# Patient Record
Sex: Female | Born: 1960
Health system: Southern US, Community
[De-identification: ages and names within clinical notes are randomized; demographics above are authoritative.]

## PROBLEM LIST (undated history)

## (undated) DIAGNOSIS — R160 Hepatomegaly, not elsewhere classified: Secondary | ICD-10-CM

## (undated) DIAGNOSIS — T8859XA Other complications of anesthesia, initial encounter: Secondary | ICD-10-CM

## (undated) DIAGNOSIS — I10 Essential (primary) hypertension: Secondary | ICD-10-CM

## (undated) DIAGNOSIS — Z9289 Personal history of other medical treatment: Secondary | ICD-10-CM

## (undated) DIAGNOSIS — M419 Scoliosis, unspecified: Secondary | ICD-10-CM

## (undated) DIAGNOSIS — H509 Unspecified strabismus: Secondary | ICD-10-CM

## (undated) DIAGNOSIS — T4145XA Adverse effect of unspecified anesthetic, initial encounter: Secondary | ICD-10-CM

## (undated) DIAGNOSIS — Z8709 Personal history of other diseases of the respiratory system: Secondary | ICD-10-CM

## (undated) DIAGNOSIS — N186 End stage renal disease: Secondary | ICD-10-CM

## (undated) DIAGNOSIS — Z9889 Other specified postprocedural states: Secondary | ICD-10-CM

## (undated) DIAGNOSIS — R112 Nausea with vomiting, unspecified: Secondary | ICD-10-CM

## (undated) DIAGNOSIS — R011 Cardiac murmur, unspecified: Secondary | ICD-10-CM

## (undated) DIAGNOSIS — Q613 Polycystic kidney, unspecified: Secondary | ICD-10-CM

## (undated) DIAGNOSIS — M62838 Other muscle spasm: Secondary | ICD-10-CM

## (undated) DIAGNOSIS — Z992 Dependence on renal dialysis: Secondary | ICD-10-CM

## (undated) HISTORY — DX: Polycystic kidney, unspecified: Q61.3

## (undated) HISTORY — PX: ABDOMINAL HYSTERECTOMY: SHX81

## (undated) HISTORY — DX: Unspecified strabismus: H50.9

## (undated) HISTORY — PX: EYE SURGERY: SHX253

## (undated) HISTORY — DX: Scoliosis, unspecified: M41.9

## (undated) HISTORY — DX: Personal history of other diseases of the respiratory system: Z87.09

## (undated) HISTORY — DX: Hepatomegaly, not elsewhere classified: R16.0

---

## 1998-05-30 ENCOUNTER — Other Ambulatory Visit: Admission: RE | Admit: 1998-05-30 | Discharge: 1998-05-30 | Payer: Self-pay | Admitting: Obstetrics and Gynecology

## 1999-07-30 ENCOUNTER — Other Ambulatory Visit: Admission: RE | Admit: 1999-07-30 | Discharge: 1999-07-30 | Payer: Self-pay | Admitting: Obstetrics and Gynecology

## 1999-09-19 ENCOUNTER — Ambulatory Visit (HOSPITAL_COMMUNITY): Admission: RE | Admit: 1999-09-19 | Discharge: 1999-09-19 | Payer: Self-pay | Admitting: Obstetrics and Gynecology

## 2000-03-04 ENCOUNTER — Encounter: Payer: Self-pay | Admitting: Obstetrics and Gynecology

## 2000-03-10 ENCOUNTER — Observation Stay (HOSPITAL_COMMUNITY): Admission: RE | Admit: 2000-03-10 | Discharge: 2000-03-11 | Payer: Self-pay | Admitting: Obstetrics and Gynecology

## 2002-10-04 ENCOUNTER — Encounter: Admission: RE | Admit: 2002-10-04 | Discharge: 2002-10-04 | Payer: Self-pay | Admitting: Gastroenterology

## 2002-10-04 ENCOUNTER — Encounter: Payer: Self-pay | Admitting: Gastroenterology

## 2002-10-12 ENCOUNTER — Ambulatory Visit (HOSPITAL_COMMUNITY): Admission: RE | Admit: 2002-10-12 | Discharge: 2002-10-12 | Payer: Self-pay | Admitting: Gastroenterology

## 2003-10-20 ENCOUNTER — Encounter: Admission: RE | Admit: 2003-10-20 | Discharge: 2003-10-20 | Payer: Self-pay | Admitting: Nephrology

## 2004-10-17 ENCOUNTER — Encounter: Admission: RE | Admit: 2004-10-17 | Discharge: 2004-10-17 | Payer: Self-pay | Admitting: Nephrology

## 2004-10-21 ENCOUNTER — Encounter: Admission: RE | Admit: 2004-10-21 | Discharge: 2004-10-21 | Payer: Self-pay | Admitting: Nephrology

## 2004-10-28 ENCOUNTER — Encounter: Admission: RE | Admit: 2004-10-28 | Discharge: 2004-10-28 | Payer: Self-pay | Admitting: Nephrology

## 2011-06-25 ENCOUNTER — Ambulatory Visit (INDEPENDENT_AMBULATORY_CARE_PROVIDER_SITE_OTHER): Payer: BC Managed Care – PPO | Admitting: Family Medicine

## 2011-06-25 VITALS — BP 150/90 | HR 71 | Temp 98.1°F | Resp 18 | Ht 63.5 in | Wt 108.2 lb

## 2011-06-25 DIAGNOSIS — M62838 Other muscle spasm: Secondary | ICD-10-CM

## 2011-06-25 DIAGNOSIS — Q613 Polycystic kidney, unspecified: Secondary | ICD-10-CM

## 2011-06-25 DIAGNOSIS — M549 Dorsalgia, unspecified: Secondary | ICD-10-CM

## 2011-06-25 LAB — COMPREHENSIVE METABOLIC PANEL WITH GFR
ALT: <8 U/L (ref 0–35)
AST: 12 U/L (ref 0–37)
Albumin: 4.5 g/dL (ref 3.5–5.2)
Alkaline Phosphatase: 230 U/L — ABNORMAL HIGH (ref 39–117)
BUN: 41 mg/dL — ABNORMAL HIGH (ref 6–23)
Creat: 3.3 mg/dL — ABNORMAL HIGH (ref 0.50–1.10)
Glucose, Bld: 69 mg/dL — ABNORMAL LOW (ref 70–99)
Sodium: 138 meq/L (ref 135–145)
Total Bilirubin: 0.3 mg/dL (ref 0.3–1.2)
Total Protein: 7.4 g/dL (ref 6.0–8.3)

## 2011-06-25 LAB — POCT UA - MICROSCOPIC ONLY
Bacteria, U Microscopic: NEGATIVE
Casts, Ur, LPF, POC: NEGATIVE
Crystals, Ur, HPF, POC: NEGATIVE
Mucus, UA: NEGATIVE
RBC, urine, microscopic: NEGATIVE
Yeast, UA: NEGATIVE

## 2011-06-25 LAB — POCT URINALYSIS DIPSTICK
Bilirubin, UA: NEGATIVE
Glucose, UA: NEGATIVE
Ketones, UA: NEGATIVE
Leukocytes, UA: NEGATIVE
Nitrite, UA: NEGATIVE
Spec Grav, UA: <=1.005
Urobilinogen, UA: 0.2
pH, UA: 5.5

## 2011-06-25 LAB — COMPREHENSIVE METABOLIC PANEL
CO2: 20 mEq/L (ref 19–32)
Calcium: 8.3 mg/dL — ABNORMAL LOW (ref 8.4–10.5)
Chloride: 109 mEq/L (ref 96–112)
Potassium: 4.7 mEq/L (ref 3.5–5.3)

## 2011-06-25 MED ORDER — CYCLOBENZAPRINE HCL 5 MG PO TABS
5.0000 mg | ORAL_TABLET | Freq: Three times a day (TID) | ORAL | Status: AC | PRN
Start: 2011-06-25 — End: 2011-07-05

## 2011-06-25 MED ORDER — TRAMADOL HCL 50 MG PO TABS: 50.0000 mg | ORAL_TABLET | Freq: Three times a day (TID) | ORAL | Status: AC | PRN

## 2011-06-25 NOTE — Progress Notes (Signed)
[facility]:  Office Visit  Chief Complaint:  Chief Complaint  Patient presents with  . Back Pain    L side of Lower Back Pain - PKD x 23 yrs ago. The pain started 3 days ago and gets worse    HPI: Kendra Clay is a 51 y.o. female who complains of: 1. 3 days ago started having left back pain, no radiation, no numbness, no weakness, no tingling. No trauma, was walking and leaning back against object when started. Was at work when noticed it but not sure if occurred at home or at work. H/o of scoliosis and h/o msk spasms in upper back 10 years ago. Tried 6 Ibuprofen without relief.  2. Elevated BP. H/o Polycystic Kidney Disease and enlarged liver with cysts. Last seen by nephrology was [facility] Dr. Lorrene Reid 6 years ago.   Past Medical History  Diagnosis Date  . Polycystic kidney disease     Genetic dx 23 years ago  . 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 Strabismus   . Scoliosis    Past Surgical History  Procedure Date  . Abdominal hysterectomy   . Eye surgery    History   Social History  . Marital Status: Married    Spouse Name: N/A    Number of Children: N/A  . Years of Education: N/A   Social History Main Topics  . Smoking status: Current Everyday Smoker -- 1.5 packs/day for 30 years    Types: Cigarettes  . Smokeless tobacco: None  . Alcohol Use: No  . Drug Use: None  . Sexually Active: None   Other Topics Concern  . None   Social History Narrative  . None   Family History  Problem Relation Age of Onset  . Arthritis Mother   . Kidney disease Father   . Polycystic kidney disease Son   . Asthma Son    Allergies  Allergen Reactions  . E-Mycin (Erythromycin) Hives  . Sulfa Antibiotics Rash   Prior to Admission medications   Not on File     ROS: The patient denies fevers, chills, night sweats, unintentional weight loss, chest pain, palpitations, wheezing, dyspnea on exertion, nausea, vomiting, abdominal pain, dysuria,  hematuria, melena, numbness, weakness, or tingling.  + back pain All other systems have been reviewed and were otherwise negative with the exception of those mentioned in the HPI and as above.    PHYSICAL EXAM: Filed Vitals:   06/25/11 1635  BP: 150/90  Pulse: 71  Temp: 98.1 F (36.7 C)  Resp: 18   Filed Vitals:   06/25/11 1635  Height: 5' 3.5" (1.613 m)  Weight: 108 lb 3.2 oz (49.079 kg)   Body mass index is 18.87 kg/(m^2).  General: Alert, no acute distress, thin white female HEENT:  Normocephalic, atraumatic, oropharynx patent.  Cardiovascular:  Regular rate and rhythm, no rubs murmurs or gallops.  No Carotid bruits, radial pulse intact. No pedal edema.  Respiratory: Clear to auscultation bilaterally.  No wheezes, rales, or rhonchi.  No cyanosis, no use of accessory musculature GI: + Enlarged liver, no spenomegaly, abdomen is soft and non-tender, positive bowel sounds.  No masses. Skin: No rashes. Neurologic: Facial musculature symmetric. Psychiatric: Patient is appropriate throughout our interaction. Lymphatic: No cervical lymphadenopathy Musculoskeletal: Gait intact. Back: + scoliosis, + tenderness on left paraspinal msk, full ROM, pain with flexion. 5/5 strength, sensation intact, +2 DTRs   LABS: Results for orders placed in visit on 06/25/11  POCT URINALYSIS DIPSTICK      Component Value Range   Color, UA yellow     Clarity, UA clear     Glucose, UA neg     Bilirubin, UA neg     Ketones, UA neg     Spec Grav, UA <=1.005     Blood, UA trace-lysed     pH, UA 5.5     Protein, UA 30mg      Urobilinogen, UA 0.2     Nitrite, UA neg     Leukocytes, UA Negative    POCT UA - MICROSCOPIC ONLY      Component Value Range   WBC, Ur, HPF, POC 0-1     RBC, urine, microscopic neg     Bacteria, U Microscopic neg     Mucus, UA neg     Epithelial cells, urine per micros 0-1     Crystals, Ur, HPF, POC neg     Casts, Ur, LPF, POC neg     Yeast, UA neg       EKG/XRAY:     Primary read interpreted by Dr. Marin Comment at [facility].   ASSESSMENT/PLAN: Encounter Diagnoses  Name Primary?  . Back pain Yes  . Polycystic kidney disease   . Muscle spasm    Back pain secondary to msk strain/sprain. Non traumatic so will defer Xray for now. Pt has a h/o of enlarged liver and polycystic kidney disease. Will rx short term Tramadol and flexeril. Advise patient to avoid Tylenol and NSAID products. Work note given to be excused from 5/22-24. CMP pending.  HTN most likely secondary to PKD, will defer management to nephrology.  Urgent referral to [place], [place], DO 06/25/2011 5:29 PM

## 2011-06-29 ENCOUNTER — Telehealth: Payer: Self-pay | Admitting: Family Medicine

## 2011-06-29 ENCOUNTER — Encounter: Payer: Self-pay | Admitting: Family Medicine

## 2011-06-29 NOTE — Telephone Encounter (Signed)
Attempted to call pt regarding lab results but phone number is no longer in service. WIll send letter. Tried to reach husband but he has same phone #.

## 2011-08-12 ENCOUNTER — Other Ambulatory Visit: Payer: Self-pay

## 2011-08-12 DIAGNOSIS — N184 Chronic kidney disease, stage 4 (severe): Secondary | ICD-10-CM

## 2011-08-12 DIAGNOSIS — Z01810 Encounter for preprocedural cardiovascular examination: Secondary | ICD-10-CM

## 2011-08-18 ENCOUNTER — Encounter: Payer: Self-pay | Admitting: Vascular Surgery

## 2011-08-25 ENCOUNTER — Encounter: Payer: Self-pay | Admitting: Vascular Surgery

## 2011-08-26 ENCOUNTER — Other Ambulatory Visit: Payer: Self-pay | Admitting: *Deleted

## 2011-08-26 ENCOUNTER — Encounter: Payer: Self-pay | Admitting: *Deleted

## 2011-08-26 ENCOUNTER — Ambulatory Visit (INDEPENDENT_AMBULATORY_CARE_PROVIDER_SITE_OTHER): Payer: BC Managed Care – PPO | Admitting: Vascular Surgery

## 2011-08-26 ENCOUNTER — Encounter (INDEPENDENT_AMBULATORY_CARE_PROVIDER_SITE_OTHER): Payer: BC Managed Care – PPO | Admitting: *Deleted

## 2011-08-26 ENCOUNTER — Encounter: Payer: Self-pay | Admitting: Vascular Surgery

## 2011-08-26 VITALS — BP 119/76 | HR 76 | Resp 18 | Ht 63.0 in | Wt 109.7 lb

## 2011-08-26 DIAGNOSIS — Z01810 Encounter for preprocedural cardiovascular examination: Secondary | ICD-10-CM

## 2011-08-26 DIAGNOSIS — N184 Chronic kidney disease, stage 4 (severe): Secondary | ICD-10-CM

## 2011-08-26 DIAGNOSIS — N186 End stage renal disease: Secondary | ICD-10-CM

## 2011-08-26 NOTE — Progress Notes (Signed)
Presents today for evaluation of AV access for hemodialysis. She has a long history of polycystic kidney disease and has recently had acceleration in her renal dysfunction. Her father also had end-stage renal disease related to polycystic kidney disease she is familiar with access issues. He has never had a hemodialysis and her most recent creatinine is 3.8.  Past Medical History  Diagnosis Date  . Polycystic kidney disease     Genetic dx 23 years ago  . 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 Strabismus   . Scoliosis   . History of bronchitis     History  Substance Use Topics  . Smoking status: Former Smoker -- 30 years    Types: Cigarettes    Quit date: 08/11/2011  . Smokeless tobacco: Not on file  . Alcohol Use: No    Family History  Problem Relation Age of Onset  . Arthritis Mother   . Kidney disease Father   . Polycystic kidney disease Son   . Asthma Son     Allergies  Allergen Reactions  . E-Mycin (Erythromycin) Hives  . Sulfa Antibiotics Rash    Current outpatient prescriptions:AMLODIPINE BESYLATE PO, Take by mouth daily., Disp: , Rfl: ;  cyclobenzaprine (FLEXERIL) 5 MG tablet, Take 5 mg by mouth as needed., Disp: , Rfl: ;  nicotine (NICODERM CQ - DOSED IN MG/24 HOURS) 21 mg/24hr patch, Place 1 patch onto the skin daily., Disp: , Rfl: ;  traMADol (ULTRAM) 50 MG tablet, Take 50 mg by mouth as needed., Disp: , Rfl:  Vitamin D, Ergocalciferol, (DRISDOL) 50000 UNITS CAPS, Take 50,000 Units by mouth 2 (two) times a week., Disp: , Rfl:   BP 119/76  Pulse 76  Resp 18  Ht 5\' 3"  (1.6 m)  Wt 109 lb 11.2 oz (49.76 kg)  BMI 19.43 kg/m2  Body mass index is 19.43 kg/(m^2).       Review of systems negative except for history of present illness.  Visible exam a well-developed thin white female in no acute distress Pulse status reveals 2+ radial pulses bilaterally. Respirations are nonlabored and equal bilaterally Neurologically she is grossly intact Abdomen is  markedly distended secondary to polycystic kidney disease, nontender Skin without ulcers or rashes. She does have very superficial skin peripheral veins bilaterally there does appear to be some thickening versus thrombus in her left antecubital vein from the old IV.  Vein map of her left arm reveals a 3 mm cephalic vein at the antecubital space and 2-1/2 mm above and below. Basilic vein ranges from 3.5-4 mm on the left.  I did reimage her right arm veins with SonoSite these do appear to be slightly larger than her left arm veins.  Impression and plan: Progressive renal insufficiency secondary to polycystic kidney disease. She has small surface veins bilaterally. She is quite thin veins are very superficial. I discussed options but have recommended exploration of her right cephalic vein at the wrist and place a wrist fistula if this is acceptable size. If not we would recommend placement of an upper arm fistula on the right we have scheduled this at her convenience on 09/01/2011

## 2011-08-29 ENCOUNTER — Encounter (HOSPITAL_COMMUNITY): Payer: Self-pay | Admitting: *Deleted

## 2011-08-29 NOTE — Progress Notes (Signed)
[id number]  Friday.... -Spoke with Barron Schmid, RN from [facility], i attempted to call both numbers...[id number] (disconnected) & [phone]--voice mail is not set up and made her aware that we cannot contact this patient at this time........DA

## 2011-08-31 MED ORDER — VANCOMYCIN HCL 1000 MG IV SOLR
1500.0000 mg | INTRAVENOUS | Status: DC
  Filled 2011-08-31: qty 1500

## 2011-09-01 ENCOUNTER — Ambulatory Visit (HOSPITAL_COMMUNITY): Payer: BC Managed Care – PPO

## 2011-09-01 ENCOUNTER — Encounter (HOSPITAL_COMMUNITY)
Admission: RE | Disposition: A | Discharge status: Home / Self Care | Payer: Self-pay | Source: Ambulatory Visit | Attending: Vascular Surgery

## 2011-09-01 ENCOUNTER — Encounter (HOSPITAL_COMMUNITY): Payer: Self-pay | Admitting: Anesthesiology

## 2011-09-01 ENCOUNTER — Telehealth: Payer: Self-pay | Admitting: Vascular Surgery

## 2011-09-01 ENCOUNTER — Ambulatory Visit (HOSPITAL_COMMUNITY): Payer: BC Managed Care – PPO | Admitting: Anesthesiology

## 2011-09-01 ENCOUNTER — Ambulatory Visit (HOSPITAL_COMMUNITY)
Admission: RE | Admit: 2011-09-01 | Discharge: 2011-09-01 | Disposition: A | Discharge status: Home / Self Care | Payer: BC Managed Care – PPO | Source: Ambulatory Visit | Attending: Vascular Surgery | Admitting: Vascular Surgery

## 2011-09-01 ENCOUNTER — Encounter (HOSPITAL_COMMUNITY): Payer: Self-pay | Admitting: *Deleted

## 2011-09-01 DIAGNOSIS — N186 End stage renal disease: Secondary | ICD-10-CM

## 2011-09-01 DIAGNOSIS — I129 Hypertensive chronic kidney disease with stage 1 through stage 4 chronic kidney disease, or unspecified chronic kidney disease: Secondary | ICD-10-CM | POA: Insufficient documentation

## 2011-09-01 DIAGNOSIS — Q613 Polycystic kidney, unspecified: Secondary | ICD-10-CM | POA: Insufficient documentation

## 2011-09-01 DIAGNOSIS — Z87891 Personal history of nicotine dependence: Secondary | ICD-10-CM | POA: Insufficient documentation

## 2011-09-01 DIAGNOSIS — N189 Chronic kidney disease, unspecified: Secondary | ICD-10-CM | POA: Insufficient documentation

## 2011-09-01 HISTORY — DX: Adverse effect of unspecified anesthetic, initial encounter: T41.45XA

## 2011-09-01 HISTORY — PX: AV FISTULA PLACEMENT: SHX1204

## 2011-09-01 HISTORY — DX: Personal history of other medical treatment: Z92.89

## 2011-09-01 HISTORY — DX: Other muscle spasm: M62.838

## 2011-09-01 HISTORY — DX: Other specified postprocedural states: Z98.890

## 2011-09-01 HISTORY — DX: Cardiac murmur, unspecified: R01.1

## 2011-09-01 HISTORY — DX: Nausea with vomiting, unspecified: R11.2

## 2011-09-01 HISTORY — DX: Other complications of anesthesia, initial encounter: T88.59XA

## 2011-09-01 HISTORY — DX: Essential (primary) hypertension: I10

## 2011-09-01 SURGERY — ARTERIOVENOUS (AV) FISTULA CREATION
Anesthesia: Monitor Anesthesia Care | Site: Arm Lower | Laterality: Right | Wound class: Clean

## 2011-09-01 MED ORDER — PROPOFOL 10 MG/ML IV EMUL
INTRAVENOUS | Status: DC | PRN
  Administered 2011-09-01: 50 ug/kg/min via INTRAVENOUS

## 2011-09-01 MED ORDER — PROMETHAZINE HCL 25 MG/ML IJ SOLN: 6.2500 mg | INTRAMUSCULAR | Status: DC | PRN

## 2011-09-01 MED ORDER — MUPIROCIN 2 % EX OINT
TOPICAL_OINTMENT | CUTANEOUS | Status: AC
  Filled 2011-09-01: qty 22

## 2011-09-01 MED ORDER — SODIUM CHLORIDE 0.9 % IR SOLN
Status: DC | PRN
  Administered 2011-09-01: 10:00:00

## 2011-09-01 MED ORDER — SODIUM CHLORIDE 0.9 % IV SOLN: INTRAVENOUS | Status: DC

## 2011-09-01 MED ORDER — MIDAZOLAM HCL 5 MG/5ML IJ SOLN
INTRAMUSCULAR | Status: DC | PRN
  Administered 2011-09-01: 2 mg via INTRAVENOUS

## 2011-09-01 MED ORDER — SODIUM CHLORIDE 0.9 % IV SOLN
INTRAVENOUS | Status: DC | PRN
  Administered 2011-09-01: 09:00:00 via INTRAVENOUS

## 2011-09-01 MED ORDER — VANCOMYCIN HCL IN DEXTROSE 1-5 GM/200ML-% IV SOLN
1000.0000 mg | INTRAVENOUS | Status: AC
  Administered 2011-09-01: 1000 mg via INTRAVENOUS

## 2011-09-01 MED ORDER — VANCOMYCIN HCL IN DEXTROSE 1-5 GM/200ML-% IV SOLN
INTRAVENOUS | Status: AC
  Filled 2011-09-01: qty 200

## 2011-09-01 MED ORDER — TRAMADOL HCL 50 MG PO TABS: 50.0000 mg | ORAL_TABLET | Freq: Three times a day (TID) | ORAL | Status: DC | PRN

## 2011-09-01 MED ORDER — FENTANYL CITRATE 0.05 MG/ML IJ SOLN: 25.0000 ug | INTRAMUSCULAR | Status: DC | PRN

## 2011-09-01 MED ORDER — LIDOCAINE-EPINEPHRINE 0.5 %-1:200000 IJ SOLN
INTRAMUSCULAR | Status: DC | PRN
  Administered 2011-09-01: 5 mL

## 2011-09-01 MED ORDER — MUPIROCIN 2 % EX OINT
TOPICAL_OINTMENT | Freq: Two times a day (BID) | CUTANEOUS | Status: DC
  Administered 2011-09-01: 07:00:00 via NASAL

## 2011-09-01 MED ORDER — 0.9 % SODIUM CHLORIDE (POUR BTL) OPTIME
TOPICAL | Status: DC | PRN
  Administered 2011-09-01: 1000 mL

## 2011-09-01 SURGICAL SUPPLY — 38 items
BENZOIN TINCTURE PRP APPL 2/3 (GAUZE/BANDAGES/DRESSINGS) ×2 IMPLANT
CANISTER SUCTION 2500CC (MISCELLANEOUS) ×2 IMPLANT
CLIP LIGATING EXTRA MED SLVR (CLIP) ×2 IMPLANT
CLIP LIGATING EXTRA SM BLUE (MISCELLANEOUS) ×2 IMPLANT
CLOTH BEACON ORANGE TIMEOUT ST (SAFETY) ×2 IMPLANT
COVER PROBE W GEL 5X96 (DRAPES) IMPLANT
COVER SURGICAL LIGHT HANDLE (MISCELLANEOUS) ×2 IMPLANT
DECANTER SPIKE VIAL GLASS SM (MISCELLANEOUS) ×2 IMPLANT
ELECT REM PT RETURN 9FT ADLT (ELECTROSURGICAL) ×2
ELECTRODE REM PT RTRN 9FT ADLT (ELECTROSURGICAL) ×1 IMPLANT
GEL ULTRASOUND 20GR AQUASONIC (MISCELLANEOUS) IMPLANT
GLOVE BIO SURGEON STRL SZ 6.5 (GLOVE) ×2 IMPLANT
GLOVE BIOGEL PI IND STRL 6.5 (GLOVE) ×1 IMPLANT
GLOVE BIOGEL PI IND STRL 7.0 (GLOVE) ×1 IMPLANT
GLOVE BIOGEL PI INDICATOR 6.5 (GLOVE) ×1
GLOVE BIOGEL PI INDICATOR 7.0 (GLOVE) ×1
GLOVE SS BIOGEL STRL SZ 7.5 (GLOVE) ×1 IMPLANT
GLOVE SUPERSENSE BIOGEL SZ 7.5 (GLOVE) ×1
GLOVE SURG SS PI 6.0 STRL IVOR (GLOVE) ×2 IMPLANT
GLOVE SURG SS PI 7.5 STRL IVOR (GLOVE) ×2 IMPLANT
GOWN PREVENTION PLUS XLARGE (GOWN DISPOSABLE) ×2 IMPLANT
GOWN STRL NON-REIN LRG LVL3 (GOWN DISPOSABLE) ×4 IMPLANT
KIT BASIN OR (CUSTOM PROCEDURE TRAY) ×2 IMPLANT
KIT ROOM TURNOVER OR (KITS) ×2 IMPLANT
NS IRRIG 1000ML POUR BTL (IV SOLUTION) ×2 IMPLANT
PACK CV ACCESS (CUSTOM PROCEDURE TRAY) ×2 IMPLANT
PAD ARMBOARD 7.5X6 YLW CONV (MISCELLANEOUS) ×4 IMPLANT
SPONGE GAUZE 4X4 12PLY (GAUZE/BANDAGES/DRESSINGS) ×2 IMPLANT
STRIP CLOSURE SKIN 1/2X4 (GAUZE/BANDAGES/DRESSINGS) ×2 IMPLANT
SUT PROLENE 6 0 CC (SUTURE) ×2 IMPLANT
SUT VIC AB 3-0 SH 27 (SUTURE) ×1
SUT VIC AB 3-0 SH 27X BRD (SUTURE) ×1 IMPLANT
SUT VICRYL 4-0 PS2 18IN ABS (SUTURE) ×2 IMPLANT
TAPE CLOTH SURG 4X10 WHT LF (GAUZE/BANDAGES/DRESSINGS) ×2 IMPLANT
TOWEL OR 17X24 6PK STRL BLUE (TOWEL DISPOSABLE) ×2 IMPLANT
TOWEL OR 17X26 10 PK STRL BLUE (TOWEL DISPOSABLE) ×2 IMPLANT
UNDERPAD 30X30 INCONTINENT (UNDERPADS AND DIAPERS) ×2 IMPLANT
WATER STERILE IRR 1000ML POUR (IV SOLUTION) ×2 IMPLANT

## 2011-09-01 NOTE — Interval H&P Note (Signed)
History and Physical Interval Note:  09/01/2011 7:15 AM  Kendra Clay  has presented today for surgery, with the diagnosis of ESRD  The various methods of treatment have been discussed with the patient and family. After consideration of risks, benefits and other options for treatment, the patient has consented to  Procedure(s) (LRB): ARTERIOVENOUS (AV) FISTULA CREATION (Right) as a surgical intervention .  The patient's history has been reviewed, patient examined, no change in status, stable for surgery.  I have reviewed the patient's chart and labs.  Questions were answered to the patient's satisfaction.     EARLY, TODD

## 2011-09-01 NOTE — Anesthesia Postprocedure Evaluation (Signed)
  Anesthesia Post-op Note  Patient: Kendra Clay  Procedure(s) Performed: Procedure(s) (LRB): ARTERIOVENOUS (AV) FISTULA CREATION (Right)  Patient Location: PACU  Anesthesia Type: MAC  Level of Consciousness: awake  Airway and Oxygen Therapy: Patient Spontanous Breathing  Post-op Pain: none  Post-op Assessment: Post-op Vital signs reviewed  Post-op Vital Signs: stable  Complications: No apparent anesthesia complications

## 2011-09-01 NOTE — Op Note (Signed)
OPERATIVE REPORT  DATE OF SURGERY: 09/01/2011  PATIENT: Kendra Clay, 51 y.o. female MRN: [id number]  DOB: [birth year]  PRE-OPERATIVE DIAGNOSIS: Chronic renal insufficiency  POST-OPERATIVE DIAGNOSIS:  Same  PROCEDURE: Right Cimino AV fistula  SURGEON:  Curt Jews, M.D.  PHYSICIAN ASSISTANT: Roczniak  ANESTHESIA:  Local with sedation  EBL: Minimal ml  Total I/O In: 300 [I.V.:300] Out: 31 [Urine:1; Blood:30]  BLOOD ADMINISTERED: None  DRAINS: None  SPECIMEN: None  COUNTS CORRECT:  YES  PLAN OF CARE: PACU stable   PATIENT DISPOSITION:  PACU - hemodynamically stable  PROCEDURE DETAILS: Patient was taken up replacing that is where the area of the right arm and right wrist were prepped in sterile fashion. An incision was made using local anesthesia between the level of the cephalic vein and the radial artery. The cephalic vein was ligated distally and divided and was gently dilated. The vein was of good caliber. The artery was exposed through the same incision. There was some spasm in the radial artery. The artery was occluded proximally and distally with Serafin clamps and was opened with an 11 blade and sent longitudinally with Potts scissors. A 1-1/2 and 2 mm dilator were passed through the artery. The vein was cut to appropriate length and was spatulated and sewn end-to-side to the artery with a running 60. Clamps removed and good flow was noted through the fistula. There were 2 obvious side branches off the cephalic vein. These were controlled by making a small incision over them with local anesthesia and they were occluded with a Hemoclip. The wounds were closed with 30 and 4-0 Vicryl sutures in the subcutaneous tissue a sterile dressing was applied   Curt Jews, M.D. 09/01/2011 11:44 AM

## 2011-09-01 NOTE — Anesthesia Preprocedure Evaluation (Addendum)
Anesthesia Evaluation  Patient identified by MRN, date of birth, ID band Patient awake    Reviewed: Allergy & Precautions, H&P , NPO status , Patient's Chart, lab work & pertinent test results, reviewed documented beta blocker date and time   History of Anesthesia Complications (+) PONV  Airway Mallampati: II TM Distance: >3 FB Neck ROM: Full    Dental  (+) Poor Dentition and Dental Advisory Given   Pulmonary former smoker,  breath sounds clear to auscultation        Cardiovascular hypertension, Pt. on medications + Valvular Problems/Murmurs Rhythm:Regular Rate:Normal     Neuro/Psych negative neurological ROS  negative psych ROS   GI/Hepatic negative GI ROS, Neg liver ROS,   Endo/Other  negative endocrine ROS  Renal/GU Renal InsufficiencyRenal disease  negative genitourinary   Musculoskeletal negative musculoskeletal ROS (+)   Abdominal   Peds negative pediatric ROS (+)  Hematology negative hematology ROS (+)   Anesthesia Other Findings   Reproductive/Obstetrics negative OB ROS                         Anesthesia Physical Anesthesia Plan  ASA: III  Anesthesia Plan: MAC   Post-op Pain Management:    Induction: Intravenous  Airway Management Planned: Simple Face Mask  Additional Equipment:   Intra-op Plan:   Post-operative Plan:   Informed Consent: I have reviewed the patients History and Physical, chart, labs and discussed the procedure including the risks, benefits and alternatives for the proposed anesthesia with the patient or authorized representative who has indicated his/her understanding and acceptance.   Dental advisory given  Plan Discussed with: CRNA and Surgeon  Anesthesia Plan Comments:         Anesthesia Quick Evaluation

## 2011-09-01 NOTE — Telephone Encounter (Signed)
lvm for pt regarding appt 09/30/11 @ 10:15 with [facility], sent letter also, dpm

## 2011-09-01 NOTE — Preoperative (Signed)
Beta Blockers   Reason not to administer Beta Blockers:Not Applicable 

## 2011-09-01 NOTE — Transfer of Care (Signed)
Immediate Anesthesia Transfer of Care Note  Patient: Kendra Clay  Procedure(s) Performed: Procedure(s) (LRB): ARTERIOVENOUS (AV) FISTULA CREATION (Right)  Patient Location: PACU  Anesthesia Type: MAC  Level of Consciousness: awake, alert  and oriented  Airway & Oxygen Therapy: Patient Spontanous Breathing  Post-op Assessment: Report given to PACU RN and Post -op Vital signs reviewed and stable  Post vital signs: Reviewed and stable  Complications: No apparent anesthesia complications

## 2011-09-01 NOTE — Telephone Encounter (Signed)
Message copied by Gena Fray on Mon Sep 01, 2011 12:22 PM ------      Message from: Alfonso Patten      Created: Mon Sep 01, 2011 12:00 PM                   ----- Message -----         From: Richrd Prime, [place]         Sent: 09/01/2011  11:09 AM           To: Alfonso Patten, RN            4 week F/U AVF - Early

## 2011-09-01 NOTE — H&P (View-Only) (Signed)
Presents today for evaluation of AV access for hemodialysis. She has a long history of polycystic kidney disease and has recently had acceleration in her renal dysfunction. Her father also had end-stage renal disease related to polycystic kidney disease she is familiar with access issues. He has never had a hemodialysis and her most recent creatinine is 3.8.  Past Medical History  Diagnosis Date  . Polycystic kidney disease     Genetic dx 23 years ago  . 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 Strabismus   . Scoliosis   . History of bronchitis     History  Substance Use Topics  . Smoking status: Former Smoker -- 30 years    Types: Cigarettes    Quit date: 08/11/2011  . Smokeless tobacco: Not on file  . Alcohol Use: No    Family History  Problem Relation Age of Onset  . Arthritis Mother   . Kidney disease Father   . Polycystic kidney disease Son   . Asthma Son     Allergies  Allergen Reactions  . E-Mycin (Erythromycin) Hives  . Sulfa Antibiotics Rash    Current outpatient prescriptions:AMLODIPINE BESYLATE PO, Take by mouth daily., Disp: , Rfl: ;  cyclobenzaprine (FLEXERIL) 5 MG tablet, Take 5 mg by mouth as needed., Disp: , Rfl: ;  nicotine (NICODERM CQ - DOSED IN MG/24 HOURS) 21 mg/24hr patch, Place 1 patch onto the skin daily., Disp: , Rfl: ;  traMADol (ULTRAM) 50 MG tablet, Take 50 mg by mouth as needed., Disp: , Rfl:  Vitamin D, Ergocalciferol, (DRISDOL) 50000 UNITS CAPS, Take 50,000 Units by mouth 2 (two) times a week., Disp: , Rfl:   BP 119/76  Pulse 76  Resp 18  Ht 5\' 3"  (1.6 m)  Wt 109 lb 11.2 oz (49.76 kg)  BMI 19.43 kg/m2  Body mass index is 19.43 kg/(m^2).       Review of systems negative except for history of present illness.  Visible exam a well-developed thin white female in no acute distress Pulse status reveals 2+ radial pulses bilaterally. Respirations are nonlabored and equal bilaterally Neurologically she is grossly intact Abdomen is  markedly distended secondary to polycystic kidney disease, nontender Skin without ulcers or rashes. She does have very superficial skin peripheral veins bilaterally there does appear to be some thickening versus thrombus in her left antecubital vein from the old IV.  Vein map of her left arm reveals a 3 mm cephalic vein at the antecubital space and 2-1/2 mm above and below. Basilic vein ranges from 3.5-4 mm on the left.  I did reimage her right arm veins with SonoSite these do appear to be slightly larger than her left arm veins.  Impression and plan: Progressive renal insufficiency secondary to polycystic kidney disease. She has small surface veins bilaterally. She is quite thin veins are very superficial. I discussed options but have recommended exploration of her right cephalic vein at the wrist and place a wrist fistula if this is acceptable size. If not we would recommend placement of an upper arm fistula on the right we have scheduled this at her convenience on 09/01/2011

## 2011-09-02 ENCOUNTER — Encounter (HOSPITAL_COMMUNITY): Payer: Self-pay | Admitting: Vascular Surgery

## 2011-09-02 LAB — POCT I-STAT 4, (NA,K, GLUC, HGB,HCT): HCT: 40 % (ref 36.0–46.0)

## 2011-09-02 NOTE — Procedures (Unsigned)
CEPHALIC VEIN MAPPING  INDICATION:  Preoperative vein mapping for planned dialysis access.  HISTORY: Chronic kidney disease.  EXAM: The right cephalic and basilic veins were not evaluated.  The left cephalic vein is compressible.  Diameter measurements range from 0.28 to 0.25 cm.  The left basilic vein is compressible.  Diameter measurements range from 0.41 to 0.36 cm.  See attached worksheet for all measurements.  IMPRESSION:  Patent left cephalic and basilic veins with diameter measurements as described above.  ___________________________________________ Rosetta Posner, M.D.  LT/MEDQ  D:  08/27/2011  T:  08/27/2011  Job:  [id number]

## 2011-09-29 ENCOUNTER — Encounter: Payer: Self-pay | Admitting: Vascular Surgery

## 2011-09-30 ENCOUNTER — Ambulatory Visit (INDEPENDENT_AMBULATORY_CARE_PROVIDER_SITE_OTHER): Payer: BC Managed Care – PPO | Admitting: Vascular Surgery

## 2011-09-30 ENCOUNTER — Encounter: Payer: Self-pay | Admitting: Vascular Surgery

## 2011-09-30 VITALS — BP 126/74 | HR 68 | Temp 98.3°F | Ht 63.0 in | Wt 107.0 lb

## 2011-09-30 DIAGNOSIS — N186 End stage renal disease: Secondary | ICD-10-CM

## 2011-09-30 NOTE — Progress Notes (Signed)
The patient is here today for followup of her AV fistula creation by myself on 09/01/2011. Her incisions are all healing quite nicely and she has excellent Julieanna Geraci maturation of her right Cimino AV fistula. She has excellent thrill and very good size maturation. She does describe some coolness in her hand especially when she is cold. I do feel that she is having some mild steal symptoms and discussed this with her. She does have a 2+ ulnar pulse at the wrist. Her surgical incisions well healed.  Impression and plan: Excellent [place] maturation of right Cimino AV fistula. The patient will continue exercising her hand and will see [place] on an as-needed basis

## 2012-05-03 ENCOUNTER — Other Ambulatory Visit: Payer: Self-pay | Admitting: Nephrology

## 2012-05-03 DIAGNOSIS — Q446 Cystic disease of liver: Secondary | ICD-10-CM

## 2012-05-06 ENCOUNTER — Ambulatory Visit
Admission: RE | Admit: 2012-05-06 | Discharge: 2012-05-06 | Disposition: A | Discharge status: Home / Self Care | Payer: BC Managed Care – PPO | Source: Ambulatory Visit | Attending: Nephrology | Admitting: Nephrology

## 2012-05-06 DIAGNOSIS — Q446 Cystic disease of liver: Secondary | ICD-10-CM

## 2013-07-17 ENCOUNTER — Ambulatory Visit (INDEPENDENT_AMBULATORY_CARE_PROVIDER_SITE_OTHER): Payer: BC Managed Care – PPO | Admitting: Family Medicine

## 2013-07-17 ENCOUNTER — Ambulatory Visit (INDEPENDENT_AMBULATORY_CARE_PROVIDER_SITE_OTHER): Payer: BC Managed Care – PPO

## 2013-07-17 VITALS — BP 122/70 | HR 74 | Temp 97.7°F | Resp 14 | Ht 63.5 in | Wt 114.8 lb

## 2013-07-17 DIAGNOSIS — Z992 Dependence on renal dialysis: Secondary | ICD-10-CM

## 2013-07-17 DIAGNOSIS — M79671 Pain in right foot: Secondary | ICD-10-CM

## 2013-07-17 DIAGNOSIS — Q613 Polycystic kidney, unspecified: Secondary | ICD-10-CM

## 2013-07-17 DIAGNOSIS — M79609 Pain in unspecified limb: Secondary | ICD-10-CM

## 2013-07-17 DIAGNOSIS — N186 End stage renal disease: Secondary | ICD-10-CM

## 2013-07-17 NOTE — Progress Notes (Signed)
Subjective:    Patient ID: Kendra Clay, female    DOB: 01/22/1961, 53 y.o.   MRN: [id number]  Toe Pain    Chief Complaint  Patient presents with   Toe Pain    patient thinks she broke her toe, right middle toe,    This chart was scribed for Robyn Haber, MD by Thea Alken, ED Scribe. This patient was seen in room 3 and the patient's care was started at 12:12 PM.  HPI Comments: Kendra Clay is a 53 y.o. female who presents to the [facility] complaining of tifht 3rd toe pain 1 day ago. Pt reports she ran into her dog. She believes her toe may be broken and that she has bruising to toe. Pt has trouble with gait. Pt has polycystic kidney disease and on dialysis.   Patient Active Problem List   Diagnosis Date Noted   End stage renal disease 08/26/2011   Past Medical History  Diagnosis Date   Polycystic kidney disease     Genetic dx 23 years ago   Enlarged liver     secondary to PKD   Strabismus    Scoliosis    History of bronchitis     numerous, last time> 1 year   Complication of anesthesia    PONV (postoperative nausea and vomiting)     patch helped   Heart murmur     "slight" per ? Dr Deatra Ina 30 years ago. 2D ECHO  30 yearsa go.   Hypertension    History of blood transfusion     C- Section   Night muscle spasms     legs   Allergies  Allergen Reactions   E-Mycin [Erythromycin] Hives   Sulfa Antibiotics Rash   Prior to Admission medications   Medication Sig Start Date End Date Taking? Authorizing Provider  cyclobenzaprine (FLEXERIL) 5 MG tablet Take 5 mg by mouth 3 (three) times daily as needed.    Yes Historical Provider, MD  loratadine (CLARITIN) 10 MG tablet Take 10 mg by mouth daily as needed for allergies.   Yes Historical Provider, MD  amLODipine (NORVASC) 10 MG tablet Take 10 mg by mouth daily.    Historical Provider, MD  calcitRIOL (ROCALTROL) 0.25 MCG capsule Take 0.25 mcg by mouth daily.    Historical Provider,  MD  nicotine (NICODERM CQ - DOSED IN MG/24 HOURS) 21 mg/24hr patch Place 1 patch onto the skin daily.    Historical Provider, MD  traMADol (ULTRAM) 50 MG tablet Take 1 tablet (50 mg total) by mouth every 8 (eight) hours as needed for pain. 09/01/11   Regina J Roczniak, PA-C  Vitamin D, Ergocalciferol, (DRISDOL) 50000 UNITS CAPS Take 50,000 Units by mouth 2 (two) times a week.    Historical Provider, MD   Review of Systems  Musculoskeletal: Positive for arthralgias, gait problem and myalgias.    Objective:   Physical Exam  Nursing note and vitals reviewed. Constitutional: She is oriented to person, place, and time. She appears well-developed and well-nourished. No distress.  HENT:  Head: Normocephalic and atraumatic.  Pt has multiple caries diffusely in her mouth  Eyes: Conjunctivae and EOM are normal.  Neck: Normal range of motion. No tracheal deviation present.  Cardiovascular: Normal rate.   Pulmonary/Chest: Effort normal. No respiratory distress.  Musculoskeletal: Normal range of motion.  Neurological: She is alert and oriented to person, place, and time.  Skin: Skin is warm and dry.  Thrill on right arm shun site.  Psychiatric: She has a normal mood and affect. Her behavior is normal.    [facility] reading (PRIMARY) done by Dr. Joseph Art- no acute findings, normal except for demineralization     Assessment & Plan:  RICE Follow up dexascan at dialysis clinic.  Patient will ask  Robyn Haber, MD

## 2014-12-03 ENCOUNTER — Ambulatory Visit (INDEPENDENT_AMBULATORY_CARE_PROVIDER_SITE_OTHER): Payer: BLUE CROSS/BLUE SHIELD

## 2014-12-03 ENCOUNTER — Ambulatory Visit (INDEPENDENT_AMBULATORY_CARE_PROVIDER_SITE_OTHER): Payer: BLUE CROSS/BLUE SHIELD | Admitting: Internal Medicine

## 2014-12-03 VITALS — BP 108/62 | HR 90 | Temp 98.4°F | Resp 16 | Ht 63.5 in | Wt 124.0 lb

## 2014-12-03 DIAGNOSIS — M25462 Effusion, left knee: Secondary | ICD-10-CM

## 2014-12-03 DIAGNOSIS — M25469 Effusion, unspecified knee: Secondary | ICD-10-CM | POA: Diagnosis not present

## 2014-12-03 LAB — SYNOVIAL CELL COUNT + DIFF, W/ CRYSTALS
Crystals, Fluid: NONE SEEN
Eosinophils-Synovial: 3 % — ABNORMAL HIGH (ref 0–1)
LYMPHOCYTES-SYNOVIAL FLD: 18 % (ref 0–20)
Monocyte/Macrophage: 1 % — ABNORMAL LOW (ref 50–90)
Neutrophil, Synovial: 78 % — ABNORMAL HIGH (ref 0–25)
WBC, Synovial: 2195 cu mm — ABNORMAL HIGH (ref 0–200)

## 2014-12-03 NOTE — Progress Notes (Signed)
Subjective:  This chart was scribed for Tami Lin, MD by Thea Alken, ED Scribe. This patient was seen in room 1 and the patient's care was started at 12:02 PM.   Patient ID: Kendra Clay, female    DOB: Sep 30, 1960, 54 y.o.   MRN: [id number]  HPI Chief Complaint  Patient presents with  . Knee Injury    feel one month ago and hit left knee - thought it was getting better but it began to swell yesterday    HPI Comments: Kendra Clay is a 54 y.o. female who presents to the [facility] complaining of a left knee injury that occurred 1 month ago. Pt fell over a cable cord in her living room 1 month ago landing on both knees. She initially had a small amount of swelling and pain to left knee. She noticed worsening swelling to left knee yesterday with very little pain. She denies new injury, fall or increase in activity.  Past Medical History  Diagnosis Date  . Polycystic kidney disease     Genetic dx 23 years ago  . 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 Strabismus   . Scoliosis   . History of bronchitis     numerous, last time> 1 year  . Complication of anesthesia   . PONV (postoperative nausea and vomiting)     patch helped  . Heart murmur     "slight" per ? Dr Deatra Ina 30 years ago. 2D ECHO  30 yearsa go.  . Hypertension   . History of blood transfusion     C- Section  . Night muscle spasms     legs   Prior to Admission medications   Medication Sig Start Date End Date Taking? Authorizing Provider  cinacalcet (SENSIPAR) 30 MG tablet Take 30 mg by mouth daily.   Yes Historical Provider, MD  cyclobenzaprine (FLEXERIL) 5 MG tablet Take 5 mg by mouth 3 (three) times daily as needed.    Yes Historical Provider, MD  loratadine (CLARITIN) 10 MG tablet Take 10 mg by mouth daily as needed for allergies.   Yes Historical Provider, MD  sevelamer carbonate (RENVELA) 800 MG tablet Take 800 mg by mouth 3 (three) times daily with meals.   Yes Historical  Provider, MD    Review of Systems  Musculoskeletal: Positive for arthralgias. Negative for gait problem.  Skin: Negative for color change, rash and wound.  Neurological: Negative for weakness and numbness.  no fever  Objective:   Physical Exam  Constitutional: She is oriented to person, place, and time. She appears well-developed and well-nourished. No distress.  HENT:  Head: Normocephalic and atraumatic.  Eyes: Conjunctivae and EOM are normal.  Neck: Neck supple.  Cardiovascular: Normal rate.   Pulmonary/Chest: Effort normal.  Musculoskeletal: Normal range of motion.  The left knee is moderately swollen especially above and lateral to the patella She is very tender to palpation above the lateral joint line along the femoral condyle The patellar blots freely with minimal tenderness There is no ligamentous laxity to stress ors McMurray's is negative Good flexion and 90 without pain Gait in the exam room is without pain No ecchymoses/no erythema/no heat  Neurological: She is alert and oriented to person, place, and time.  Skin: Skin is warm and dry.  Psychiatric: She has a normal mood and affect. Her behavior is normal.  Nursing note and vitals reviewed.    Filed Vitals:   12/03/14 1121  BP: 108/62  Pulse: 90  Temp: 98.4 F (36.9 C)  TempSrc: Oral  Resp: 16  Height: 5' 3.5" (1.613 m)  Weight: 124 lb (56.246 kg)  SpO2: 98%   [facility] reading (PRIMARY) by Dr. Laney Pastor. Left knee appears normal except for signs of effusion.  Procedure--after informed consent sterile field was prepared and 30 mL of bloody fluid was removed with 18-gauge needle without any anesthesia. Wound was compressed with Ace wrap over sterile gauze.  Assessment & Plan:  Swelling of knee joint, left  Knee pain Effusion -  Plan: Body fluid culture, Cell count + diff,  w/ cryst-synvl fld -tylenol -ice 20 tid -May need consideration of meniscus injury tho exam neg for now//this should be related to  her trauma of 1 month ago//seems unlikely to have been precipitated by heparin at dialysis time//will set up ortho followup  Dialysis MWF as noted   Orders Placed This Encounter  Procedures  . DG Knee Complete 4 Views Left    Standing Status: Future     Number of Occurrences: 1     Standing Expiration Date: 12/03/2015    Order Specific Question:  Reason for Exam (SYMPTOM  OR DIAGNOSIS REQUIRED)    Answer:  swelling/injury    Order Specific Question:  Is the patient pregnant?    Answer:  No    Order Specific Question:  Preferred imaging location?    Answer:  External  Body Fluid Culture: [id number]   -Cell Count + diff, w/cryst-synvl fld: [id number]    By signing my name below, I, Raven Small, attest that this documentation has been prepared under the direction and in the presence of Tami Lin, MD.  Electronically Signed: Thea Alken, ED Scribe. 12/03/2014. 1:01 PM.  I have completed the patient encounter in its entirety as documented by the scribe, with editing by me where necessary. Adin Lariccia P. Laney Pastor, M.D.

## 2014-12-05 ENCOUNTER — Telehealth: Payer: Self-pay

## 2014-12-05 DIAGNOSIS — M25462 Effusion, left knee: Secondary | ICD-10-CM

## 2014-12-05 NOTE — Telephone Encounter (Signed)
Patient was seen Sunday and had her knee aspirated.  It if very sore and swollen  [phone]

## 2014-12-05 NOTE — Telephone Encounter (Signed)
It should not be swollen again this soon!! Labs showed no infection and no gout so underlying injury is problem. I need for her to be seen by ortho this week. Call her to see if we can set this up((then call for anyone at [facility] ortho to see )

## 2014-12-05 NOTE — Telephone Encounter (Signed)
I believe this is normal. Did we give any pain medication. I think this is what pt is wanting.

## 2014-12-06 LAB — BODY FLUID CULTURE
GRAM STAIN: NONE SEEN
Gram Stain: NONE SEEN
Organism ID, Bacteria: NO GROWTH

## 2014-12-06 NOTE — Telephone Encounter (Signed)
She states the swelling has gone down but it is really sore. She would like referral to ortho. Referrals can we get her in as soon as possible?

## 2014-12-07 ENCOUNTER — Telehealth: Payer: Self-pay

## 2014-12-07 NOTE — Telephone Encounter (Signed)
Copy of message sent to xray.

## 2014-12-07 NOTE — Telephone Encounter (Signed)
Pt needs a copy of her Xray that was done on 12/03/14 so she can take it with her to [place] on 12/08/14

## 2014-12-11 ENCOUNTER — Other Ambulatory Visit: Payer: Self-pay | Admitting: Sports Medicine

## 2014-12-11 DIAGNOSIS — M25562 Pain in left knee: Secondary | ICD-10-CM

## 2014-12-26 ENCOUNTER — Ambulatory Visit
Admission: RE | Admit: 2014-12-26 | Discharge: 2014-12-26 | Disposition: A | Discharge status: Home / Self Care | Payer: BLUE CROSS/BLUE SHIELD | Source: Ambulatory Visit | Attending: Sports Medicine | Admitting: Sports Medicine

## 2014-12-26 DIAGNOSIS — M25562 Pain in left knee: Secondary | ICD-10-CM

## 2015-02-27 ENCOUNTER — Other Ambulatory Visit: Payer: Self-pay

## 2015-08-13 ENCOUNTER — Encounter (HOSPITAL_COMMUNITY): Payer: Self-pay | Admitting: Family Medicine

## 2015-08-13 ENCOUNTER — Inpatient Hospital Stay (HOSPITAL_COMMUNITY)
Admission: EM | Admit: 2015-08-13 | Discharge: 2015-08-16 | DRG: 189 | Disposition: A | Discharge status: Home / Self Care | Payer: Medicare Other | Attending: Internal Medicine | Admitting: Internal Medicine

## 2015-08-13 ENCOUNTER — Emergency Department (HOSPITAL_COMMUNITY): Payer: Medicare Other

## 2015-08-13 DIAGNOSIS — J810 Acute pulmonary edema: Secondary | ICD-10-CM | POA: Diagnosis present

## 2015-08-13 DIAGNOSIS — I340 Nonrheumatic mitral (valve) insufficiency: Secondary | ICD-10-CM | POA: Diagnosis present

## 2015-08-13 DIAGNOSIS — Q613 Polycystic kidney, unspecified: Secondary | ICD-10-CM

## 2015-08-13 DIAGNOSIS — D631 Anemia in chronic kidney disease: Secondary | ICD-10-CM | POA: Diagnosis present

## 2015-08-13 DIAGNOSIS — J9601 Acute respiratory failure with hypoxia: Principal | ICD-10-CM | POA: Diagnosis present

## 2015-08-13 DIAGNOSIS — I2510 Atherosclerotic heart disease of native coronary artery without angina pectoris: Secondary | ICD-10-CM | POA: Diagnosis present

## 2015-08-13 DIAGNOSIS — Z87891 Personal history of nicotine dependence: Secondary | ICD-10-CM

## 2015-08-13 DIAGNOSIS — Q612 Polycystic kidney, adult type: Secondary | ICD-10-CM

## 2015-08-13 DIAGNOSIS — R778 Other specified abnormalities of plasma proteins: Secondary | ICD-10-CM

## 2015-08-13 DIAGNOSIS — D696 Thrombocytopenia, unspecified: Secondary | ICD-10-CM | POA: Diagnosis present

## 2015-08-13 DIAGNOSIS — D638 Anemia in other chronic diseases classified elsewhere: Secondary | ICD-10-CM | POA: Diagnosis present

## 2015-08-13 DIAGNOSIS — R0602 Shortness of breath: Secondary | ICD-10-CM

## 2015-08-13 DIAGNOSIS — Z955 Presence of coronary angioplasty implant and graft: Secondary | ICD-10-CM

## 2015-08-13 DIAGNOSIS — R7989 Other specified abnormal findings of blood chemistry: Secondary | ICD-10-CM

## 2015-08-13 DIAGNOSIS — I132 Hypertensive heart and chronic kidney disease with heart failure and with stage 5 chronic kidney disease, or end stage renal disease: Secondary | ICD-10-CM | POA: Diagnosis present

## 2015-08-13 DIAGNOSIS — N186 End stage renal disease: Secondary | ICD-10-CM | POA: Diagnosis present

## 2015-08-13 DIAGNOSIS — I5022 Chronic systolic (congestive) heart failure: Secondary | ICD-10-CM | POA: Diagnosis present

## 2015-08-13 DIAGNOSIS — I248 Other forms of acute ischemic heart disease: Secondary | ICD-10-CM | POA: Diagnosis present

## 2015-08-13 DIAGNOSIS — D649 Anemia, unspecified: Secondary | ICD-10-CM | POA: Diagnosis present

## 2015-08-13 DIAGNOSIS — Z992 Dependence on renal dialysis: Secondary | ICD-10-CM

## 2015-08-13 DIAGNOSIS — M419 Scoliosis, unspecified: Secondary | ICD-10-CM | POA: Diagnosis present

## 2015-08-13 DIAGNOSIS — Z7901 Long term (current) use of anticoagulants: Secondary | ICD-10-CM

## 2015-08-13 DIAGNOSIS — Z7982 Long term (current) use of aspirin: Secondary | ICD-10-CM

## 2015-08-13 DIAGNOSIS — I97630 Postprocedural hematoma of a circulatory system organ or structure following a cardiac catheterization: Secondary | ICD-10-CM | POA: Diagnosis present

## 2015-08-13 DIAGNOSIS — Y840 Cardiac catheterization as the cause of abnormal reaction of the patient, or of later complication, without mention of misadventure at the time of the procedure: Secondary | ICD-10-CM | POA: Diagnosis present

## 2015-08-13 LAB — I-STAT TROPONIN, ED: Troponin i, poc: 0.11 ng/mL (ref 0.00–0.08)

## 2015-08-13 LAB — CBC
HEMATOCRIT: 28.2 % — AB (ref 36.0–46.0)
HEMOGLOBIN: 9.2 g/dL — AB (ref 12.0–15.0)
MCH: 30.3 pg (ref 26.0–34.0)
MCHC: 32.6 g/dL (ref 30.0–36.0)
MCV: 92.8 fL (ref 78.0–100.0)
Platelets: 123 10*3/uL — ABNORMAL LOW (ref 150–400)
RBC: 3.04 MIL/uL — ABNORMAL LOW (ref 3.87–5.11)
RDW: 16.5 % — ABNORMAL HIGH (ref 11.5–15.5)
WBC: 4 10*3/uL (ref 4.0–10.5)

## 2015-08-13 LAB — BASIC METABOLIC PANEL
ANION GAP: 9 (ref 5–15)
BUN: 15 mg/dL (ref 6–20)
CO2: 30 mmol/L (ref 22–32)
Calcium: 10.2 mg/dL (ref 8.9–10.3)
Chloride: 94 mmol/L — ABNORMAL LOW (ref 101–111)
Creatinine, Ser: 4.94 mg/dL — ABNORMAL HIGH (ref 0.44–1.00)
GFR calc Af Amer: 11 mL/min — ABNORMAL LOW (ref >60)
GFR, EST NON AFRICAN AMERICAN: 9 mL/min — AB (ref >60)
GLUCOSE: 134 mg/dL — AB (ref 65–99)
POTASSIUM: 3.3 mmol/L — AB (ref 3.5–5.1)
Sodium: 133 mmol/L — ABNORMAL LOW (ref 135–145)

## 2015-08-13 MED ORDER — ASPIRIN 81 MG PO CHEW
324.0000 mg | CHEWABLE_TABLET | Freq: Once | ORAL | Status: AC
  Administered 2015-08-13: 324 mg via ORAL
  Filled 2015-08-13: qty 4

## 2015-08-13 NOTE — ED Provider Notes (Signed)
CSN: [id number]     Arrival date & time 08/13/15  1817 History   None    Chief Complaint  Patient presents with  . Shortness of Breath     (Consider location/radiation/quality/duration/timing/severity/associated sxs/prior Treatment) HPI    Blood pressure 135/88, pulse 93, temperature 99 F (37.2 C), temperature source Oral, resp. rate 16, SpO2 96 %.  Kendra Clay is a 55 y.o. female with past medical history significant for ESRD on dialysis, fully dialyzed today, her hemoglobin was noted to be low at 6.9, she was sent for outpatient transfusion, she had one unit and was discharged, she became short of breath afterwards with no chest pain, palpitations but she does endorse of dizziness when she stands up. Patient denies increasing peripheral edema, cough at baseline, syncope. Of note, this patient had a CHF exacerbation she was evaluated at [facility], she had a catheterization at that time which showed occlusion of the LAD with a stent placement, she's been on Brillenta to which she's been compliant with but she missed her dose today. She was transfused on this admission.  Denies melena/hematochezia Not followed with cardiology since her discharge, she has an appointment set often [facility] in July  No primary care physician, she follows a [place] [facility].  Past Medical History  Diagnosis Date  . Polycystic kidney disease     Genetic dx 23 years ago  . 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 Strabismus   . Scoliosis   . History of bronchitis     numerous, last time> 1 year  . Complication of anesthesia   . PONV (postoperative nausea and vomiting)     patch helped  . Heart murmur     "slight" per ? Dr Deatra Ina 30 years ago. 2D ECHO  30 yearsa go.  . Hypertension   . History of blood transfusion     C- Section  . Night muscle spasms     legs   Past Surgical History  Procedure Laterality Date  . Abdominal hysterectomy    . Cesarean section  1997  . Eye surgery      for  lazy eye  . Av fistula placement  09/01/2011    Procedure: ARTERIOVENOUS (AV) FISTULA CREATION;  Surgeon: Rosetta Posner, MD;  Location: [facility] OR;  Service: Vascular;  Laterality: Right;   Family History  Problem Relation Age of Onset  . Arthritis Mother   . Kidney disease Father   . Polycystic kidney disease Son   . Asthma Son   . Hypertension Maternal Grandmother    Social History  Substance Use Topics  . Smoking status: Former Smoker -- 1.00 packs/day for 30 years    Types: Cigarettes  . Smokeless tobacco: Former Systems developer    Quit date: 01/09/2014  . Alcohol Use: No   OB History    No data available     Review of Systems  10 systems reviewed and found to be negative, except as noted in the HPI.  Allergies  E-mycin and Sulfa antibiotics  Home Medications   Prior to Admission medications   Medication Sig Start Date End Date Taking? Authorizing Provider  cinacalcet (SENSIPAR) 30 MG tablet Take 30 mg by mouth daily.    Historical Provider, MD  cyclobenzaprine (FLEXERIL) 5 MG tablet Take 5 mg by mouth 3 (three) times daily as needed.     Historical Provider, MD  loratadine (CLARITIN) 10 MG tablet Take 10 mg by mouth daily as needed for  allergies.    Historical Provider, MD  sevelamer carbonate (RENVELA) 800 MG tablet Take 800 mg by mouth 3 (three) times daily with meals.    Historical Provider, MD   BP 135/74 mmHg  Pulse 103  Temp(Src) 99 F (37.2 C) (Oral)  Resp 18  SpO2 94% Physical Exam  Constitutional: She is oriented to person, place, and time. She appears well-developed and well-nourished. No distress.  HENT:  Head: Normocephalic.  Mouth/Throat: Oropharynx is clear and moist.  Eyes: Conjunctivae and EOM are normal. Pupils are equal, round, and reactive to light.  Neck: Normal range of motion. No JVD present. No tracheal deviation present.  Cardiovascular: Normal rate, regular rhythm and intact distal pulses.   Fistula to left arm with good thrill  Pulmonary/Chest:  Effort normal and breath sounds normal. No stridor. No respiratory distress. She has no wheezes. She has no rales. She exhibits no tenderness.  Abdominal: Soft. Bowel sounds are normal. She exhibits no distension and no mass. There is no tenderness. There is no rebound and no guarding.  Musculoskeletal: Normal range of motion. She exhibits no edema or tenderness.  No calf asymmetry, superficial collaterals, palpable cords, edema, Homans sign negative bilaterally.    Neurological: She is alert and oriented to person, place, and time.  Skin: Skin is warm. She is not diaphoretic.  Psychiatric: She has a normal mood and affect.  Nursing note and vitals reviewed.   ED Course  Procedures (including critical care time) Labs Review Labs Reviewed  BASIC METABOLIC PANEL - Abnormal; Notable for the following:    Sodium 133 (*)    Potassium 3.3 (*)    Chloride 94 (*)    Glucose, Bld 134 (*)    Creatinine, Ser 4.94 (*)    GFR calc non Af Amer 9 (*)    GFR calc Af Amer 11 (*)    All other components within normal limits  CBC - Abnormal; Notable for the following:    RBC 3.04 (*)    Hemoglobin 9.2 (*)    HCT 28.2 (*)    RDW 16.5 (*)    Platelets 123 (*)    All other components within normal limits  I-STAT TROPOININ, ED - Abnormal; Notable for the following:    Troponin i, poc 0.11 (*)    All other components within normal limits    Imaging Review Dg Chest 2 View  08/13/2015  CLINICAL DATA:  Shortness of breath in a dialysis patient. EXAM: CHEST  2 VIEW COMPARISON:  PA and lateral chest 09/01/2011 and 07/17/2015. FINDINGS: The lungs appear emphysematous. Pulmonary edema seen on the most recent examination is markedly improved. Chronic coarsening of the pulmonary interstitium in the lingula, right middle lobe and lower lobes is not notably changed. Heart size is upper normal. Atherosclerosis is seen. IMPRESSION: Emphysema and chronic interstitial change.  No acute disease. Electronically Signed    By: Inge Rise M.D.   On: 08/13/2015 19:01   I have personally reviewed and evaluated these images and lab results as part of my medical decision-making.   EKG Interpretation None      MDM   Final diagnoses:  SOB (shortness of breath)  Elevated troponin   Filed Vitals:   08/13/15 1829 08/13/15 2224 08/13/15 2332  BP: 135/74 135/88 139/73  Pulse: 103 93 93  Temp: 99 F (37.2 C)    TempSrc: Oral    Resp: 18 16 16   SpO2: 94% 96% 96%    Medications  aspirin chewable  tablet 324 mg (324 mg Oral Given 08/13/15 2229)    Kendra Clay is 55 y.o. female presenting with Shortness of breath onset after transfusion of 1 unit PRBCs today. Patient is newly diagnosed CHF, she had a cath on her admission for CHF 3 weeks ago for size with single stent placement. She has no chest pain associated. Istat Troponin today is elevated at 0.11. Trop T at [facility] was 0.026. Patient denies melena, hematochezia, she had a ultrasound today in the left groin area due to pain it was negative for pseudoaneurysm as per patient. CT abdomen pelvis with no retroperitoneal hematoma.  TRALI vs CHF, d/w attending who recommends not obtaining BNP, given her ESRD. D/w Hal Hope who accepts admission.         Monico Blitz, PA-C 08/14/15 0030  Davonna Belling, MD 08/14/15 [phone]

## 2015-08-13 NOTE — ED Notes (Signed)
Pt here for SOB. sts that she had dialysis this am. sts after that she had a blood transfusion and then became SOB. Denies chest pain. sts hgb 6.9 this am.

## 2015-08-13 NOTE — ED Notes (Signed)
Patient transported to CT scan . 

## 2015-08-14 ENCOUNTER — Encounter (HOSPITAL_COMMUNITY): Payer: Self-pay | Admitting: Internal Medicine

## 2015-08-14 DIAGNOSIS — J810 Acute pulmonary edema: Secondary | ICD-10-CM | POA: Diagnosis present

## 2015-08-14 DIAGNOSIS — R0602 Shortness of breath: Secondary | ICD-10-CM | POA: Diagnosis present

## 2015-08-14 DIAGNOSIS — J9601 Acute respiratory failure with hypoxia: Secondary | ICD-10-CM | POA: Diagnosis not present

## 2015-08-14 DIAGNOSIS — D649 Anemia, unspecified: Secondary | ICD-10-CM | POA: Diagnosis present

## 2015-08-14 LAB — COMPREHENSIVE METABOLIC PANEL
ALT: 12 U/L — ABNORMAL LOW (ref 14–54)
AST: 19 U/L (ref 15–41)
Albumin: 3.4 g/dL — ABNORMAL LOW (ref 3.5–5.0)
Alkaline Phosphatase: 58 U/L (ref 38–126)
Anion gap: 8 (ref 5–15)
BUN: 24 mg/dL — ABNORMAL HIGH (ref 6–20)
CALCIUM: 10.2 mg/dL (ref 8.9–10.3)
CO2: 31 mmol/L (ref 22–32)
CREATININE: 6.17 mg/dL — AB (ref 0.44–1.00)
Chloride: 96 mmol/L — ABNORMAL LOW (ref 101–111)
GFR calc non Af Amer: 7 mL/min — ABNORMAL LOW (ref >60)
GFR, EST AFRICAN AMERICAN: 8 mL/min — AB (ref >60)
Glucose, Bld: 127 mg/dL — ABNORMAL HIGH (ref 65–99)
Potassium: 3.4 mmol/L — ABNORMAL LOW (ref 3.5–5.1)
SODIUM: 135 mmol/L (ref 135–145)
Total Bilirubin: 1.7 mg/dL — ABNORMAL HIGH (ref 0.3–1.2)
Total Protein: 7 g/dL (ref 6.5–8.1)

## 2015-08-14 LAB — CBC WITH DIFFERENTIAL/PLATELET
BASOS PCT: 0 %
Basophils Absolute: 0 10*3/uL (ref 0.0–0.1)
EOS ABS: 0 10*3/uL (ref 0.0–0.7)
EOS PCT: 0 %
HCT: 24.3 % — ABNORMAL LOW (ref 36.0–46.0)
Hemoglobin: 7.9 g/dL — ABNORMAL LOW (ref 12.0–15.0)
LYMPHS ABS: 0.6 10*3/uL — AB (ref 0.7–4.0)
Lymphocytes Relative: 17 %
MCH: 29.9 pg (ref 26.0–34.0)
MCHC: 32.5 g/dL (ref 30.0–36.0)
MCV: 92 fL (ref 78.0–100.0)
MONOS PCT: 8 %
Monocytes Absolute: 0.3 10*3/uL (ref 0.1–1.0)
Neutro Abs: 2.8 10*3/uL (ref 1.7–7.7)
Neutrophils Relative %: 75 %
PLATELETS: 111 10*3/uL — AB (ref 150–400)
RBC: 2.64 MIL/uL — AB (ref 3.87–5.11)
RDW: 16.7 % — ABNORMAL HIGH (ref 11.5–15.5)
WBC: 3.7 10*3/uL — AB (ref 4.0–10.5)

## 2015-08-14 LAB — ABO/RH: ABO/RH(D): O POS

## 2015-08-14 LAB — TROPONIN I
TROPONIN I: 0.39 ng/mL — AB (ref <0.03)
TROPONIN I: 0.27 ng/mL — AB (ref <0.03)
Troponin I: 0.19 ng/mL (ref <0.03)

## 2015-08-14 LAB — PROTIME-INR
INR: 1.21 (ref 0.00–1.49)
Prothrombin Time: 15.4 seconds — ABNORMAL HIGH (ref 11.6–15.2)

## 2015-08-14 LAB — MRSA PCR SCREENING: MRSA by PCR: NEGATIVE

## 2015-08-14 LAB — D-DIMER, QUANTITATIVE: D-Dimer, Quant: 1.02 ug/mL-FEU — ABNORMAL HIGH (ref 0.00–0.50)

## 2015-08-14 MED ORDER — CYCLOBENZAPRINE HCL 5 MG PO TABS: 5.0000 mg | ORAL_TABLET | Freq: Three times a day (TID) | ORAL | Status: DC | PRN

## 2015-08-14 MED ORDER — NITROGLYCERIN 0.4 MG SL SUBL: 0.4000 mg | SUBLINGUAL_TABLET | SUBLINGUAL | Status: DC | PRN

## 2015-08-14 MED ORDER — ATORVASTATIN CALCIUM 40 MG PO TABS
40.0000 mg | ORAL_TABLET | Freq: Every day | ORAL | Status: DC
  Administered 2015-08-14 – 2015-08-15 (×2): 40 mg via ORAL
  Filled 2015-08-14 (×3): qty 1

## 2015-08-14 MED ORDER — ACETAMINOPHEN 650 MG RE SUPP: 650.0000 mg | Freq: Four times a day (QID) | RECTAL | Status: DC | PRN

## 2015-08-14 MED ORDER — ONDANSETRON HCL 4 MG PO TABS: 4.0000 mg | ORAL_TABLET | Freq: Four times a day (QID) | ORAL | Status: DC | PRN

## 2015-08-14 MED ORDER — ISOSORBIDE MONONITRATE ER 30 MG PO TB24
30.0000 mg | ORAL_TABLET | Freq: Every day | ORAL | Status: DC
  Administered 2015-08-14 – 2015-08-15 (×2): 30 mg via ORAL
  Filled 2015-08-14 (×3): qty 1

## 2015-08-14 MED ORDER — SODIUM CHLORIDE 0.9% FLUSH
3.0000 mL | Freq: Two times a day (BID) | INTRAVENOUS | Status: DC
  Administered 2015-08-14 – 2015-08-15 (×4): 3 mL via INTRAVENOUS

## 2015-08-14 MED ORDER — LISINOPRIL 10 MG PO TABS
10.0000 mg | ORAL_TABLET | Freq: Every day | ORAL | Status: DC
  Administered 2015-08-15: 10 mg via ORAL
  Filled 2015-08-14 (×3): qty 1

## 2015-08-14 MED ORDER — CARVEDILOL 6.25 MG PO TABS
6.2500 mg | ORAL_TABLET | Freq: Two times a day (BID) | ORAL | Status: DC
  Administered 2015-08-14 – 2015-08-16 (×4): 6.25 mg via ORAL
  Filled 2015-08-14 (×4): qty 1

## 2015-08-14 MED ORDER — ASPIRIN EC 81 MG PO TBEC
81.0000 mg | DELAYED_RELEASE_TABLET | Freq: Every day | ORAL | Status: DC
  Administered 2015-08-14 – 2015-08-15 (×2): 81 mg via ORAL
  Filled 2015-08-14 (×3): qty 1

## 2015-08-14 MED ORDER — LORATADINE 10 MG PO TABS: 10.0000 mg | ORAL_TABLET | Freq: Every day | ORAL | Status: DC | PRN

## 2015-08-14 MED ORDER — TICAGRELOR 90 MG PO TABS
90.0000 mg | ORAL_TABLET | Freq: Two times a day (BID) | ORAL | Status: DC
  Administered 2015-08-14 – 2015-08-16 (×5): 90 mg via ORAL
  Filled 2015-08-14 (×6): qty 1

## 2015-08-14 MED ORDER — SEVELAMER CARBONATE 800 MG PO TABS
800.0000 mg | ORAL_TABLET | Freq: Three times a day (TID) | ORAL | Status: DC
  Administered 2015-08-14 – 2015-08-15 (×5): 800 mg via ORAL
  Filled 2015-08-14 (×6): qty 1

## 2015-08-14 MED ORDER — DOXERCALCIFEROL 4 MCG/2ML IV SOLN
6.0000 ug | INTRAVENOUS | Status: DC
  Administered 2015-08-15: 6 ug via INTRAVENOUS
  Filled 2015-08-14: qty 4

## 2015-08-14 MED ORDER — ONDANSETRON HCL 4 MG/2ML IJ SOLN: 4.0000 mg | Freq: Four times a day (QID) | INTRAMUSCULAR | Status: DC | PRN

## 2015-08-14 MED ORDER — ACETAMINOPHEN 325 MG PO TABS: 650.0000 mg | ORAL_TABLET | Freq: Four times a day (QID) | ORAL | Status: DC | PRN

## 2015-08-14 NOTE — Progress Notes (Signed)
Pt with ESRD on MWF HD in [place] admitted with SOB to OBS status Hgb steadily trending down with slow resumption of ESA - off ESA since early June when hgb was 9.5 down to 8.2 6/28 and 6.9 7/10 pre HD - transfused 1 unit yesterday then hgb up to 9.2 7/10   Last Mircera 100 given 7/5  Had [place] right groin for showed hematoma  From PTA down about 3 weeks ago.  CXR showed emphysema and chronic interstial changes.  Dialysis orders written for Wed. Please advise if admitted then will do full consult.  HD [facility] MWF 3 hr EDW 54.5 2 K 2.25 Ca no heparin right lower AVF hectorol 6 Mircera 225 q 2 weeks due 7/19 Qb 350/A 1.5  Amalia Hailey, PA-C

## 2015-08-14 NOTE — H&P (Signed)
History and Physical    Kendra Clay [id number] DOB: [birth year] DOA: 08/13/2015  PCP: No primary care provider on file.  Patient coming from: Home.  Chief Complaint: Shortness of breath.  HPI: Kendra Clay is a 55 y.o. female with ESRD on hemodialysis on Monday Wednesday and Friday, CAD status post stenting last month on [id number], systolic CHF last EF measured was last month 35-40%, anemia presents to the ER because of shortness of breath. Patient states yesterday after her dialysis patient was told her hemoglobin is around 6.9 and was referred to [facility] for transfusion. After her transfusion patient had gone home and felt short of breath and came to ER at [facility]. Patient's shortness of breath is now on lying flat. Chest x-ray is unremarkable. Patient is being admitted for further observation. Patient has right groin hematoma for which patient states cardiology had done sonogram yesterday at [facility] and was unremarkable. CT of the abdomen and pelvis done in the ER over here is negative for any retroperitoneal hematoma.   ED Course: See history of present illness.  Review of Systems: As per HPI, rest all negative.   Past Medical History  Diagnosis Date  . Polycystic kidney disease     Genetic dx 23 years ago  . 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 Strabismus   . Scoliosis   . History of bronchitis     numerous, last time> 1 year  . Complication of anesthesia   . PONV (postoperative nausea and vomiting)     patch helped  . Heart murmur     "slight" per ? Dr Deatra Ina 30 years ago. 2D ECHO  30 yearsa go.  . Hypertension   . History of blood transfusion     C- Section  . Night muscle spasms     legs    Past Surgical History  Procedure Laterality Date  . Abdominal hysterectomy    . Cesarean section  1997  . Eye surgery      for lazy eye  . Av fistula placement  09/01/2011    Procedure: ARTERIOVENOUS (AV) FISTULA CREATION;   Surgeon: Rosetta Posner, MD;  Location: [place];  Service: Vascular;  Laterality: Right;     reports that she has quit smoking. Her smoking use included Cigarettes. She has a 30 pack-year smoking history. She quit smokeless tobacco use about 19 months ago. She reports that she does not drink alcohol or use illicit drugs.  Allergies  Allergen Reactions  . E-Mycin [Erythromycin] Hives  . Sulfa Antibiotics Rash    Family History  Problem Relation Age of Onset  . Arthritis Mother   . Kidney disease Father   . Polycystic kidney disease Son   . Asthma Son   . Hypertension Maternal Grandmother     Prior to Admission medications   Medication Sig Start Date End Date Taking? Authorizing Provider  aspirin EC 81 MG tablet Take 81 mg by mouth daily.   Yes Historical Provider, MD  atorvastatin (LIPITOR) 40 MG tablet Take 40 mg by mouth daily.   Yes Historical Provider, MD  carvedilol (COREG) 6.25 MG tablet Take 6.25 mg by mouth 2 (two) times daily with a meal.   Yes Historical Provider, MD  cyclobenzaprine (FLEXERIL) 5 MG tablet Take 5 mg by mouth 3 (three) times daily as needed for muscle spasms.    Yes Historical Provider, MD  isosorbide mononitrate (IMDUR) 30 MG 24 hr tablet Take  30 mg by mouth daily.   Yes Historical Provider, MD  lisinopril (PRINIVIL,ZESTRIL) 10 MG tablet Take 10 mg by mouth daily.   Yes Historical Provider, MD  loratadine (CLARITIN) 10 MG tablet Take 10 mg by mouth daily as needed for allergies.   Yes Historical Provider, MD  nitroGLYCERIN (NITROSTAT) 0.4 MG SL tablet Place 0.4 mg under the tongue every 5 (five) minutes as needed for chest pain.   Yes Historical Provider, MD  sevelamer carbonate (RENVELA) 800 MG tablet Take 800 mg by mouth 3 (three) times daily with meals.   Yes Historical Provider, MD  ticagrelor (BRILINTA) 90 MG TABS tablet Take 90 mg by mouth 2 (two) times daily.   Yes Historical Provider, MD    Physical Exam: Filed Vitals:   08/14/15 0042 08/14/15 0100  08/14/15 0130 08/14/15 0159  BP: 146/90 137/86 134/90 148/87  Pulse: 82 85 86 86  Temp:    97.7 F (36.5 C)  TempSrc:    Oral  Resp: 16 19 25 20   Height:    5\' 3"  (1.6 m)  Weight:    120 lb 9.5 oz (54.7 kg)  SpO2: 99% 97% 100% 100%      Constitutional: Not in distress. Filed Vitals:   08/14/15 0042 08/14/15 0100 08/14/15 0130 08/14/15 0159  BP: 146/90 137/86 134/90 148/87  Pulse: 82 85 86 86  Temp:    97.7 F (36.5 C)  TempSrc:    Oral  Resp: 16 19 25 20   Height:    5\' 3"  (1.6 m)  Weight:    120 lb 9.5 oz (54.7 kg)  SpO2: 99% 97% 100% 100%   Eyes: Anicteric no pallor. ENMT: No discharge from the ears eyes nose and mouth. Neck: No JVD appreciated no mass felt. Respiratory: No rhonchi or crepitations. Cardiovascular: S1 and S2 heard. Abdomen: Soft nontender bowel sounds present. Musculoskeletal: Ecchymotic area in the right groin area. Skin: Ecchymotic area in the right groin area. Neurologic: Alert awake oriented to time place and person. Moves all extremities. Psychiatric: Appears normal.   Labs on Admission: I have personally reviewed following labs and imaging studies  CBC:  Recent Labs Lab 08/13/15 1837  WBC 4.0  HGB 9.2*  HCT 28.2*  MCV 92.8  PLT [id number]*   Basic Metabolic Panel:  Recent Labs Lab 08/13/15 1837  NA 133*  K 3.3*  CL 94*  CO2 30  GLUCOSE 134*  BUN 15  CREATININE 4.94*  CALCIUM 10.2   GFR: Estimated Creatinine Clearance: 10.8 mL/min (by C-G formula based on Cr of 4.94). Liver Function Tests: No results for input(s): AST, ALT, ALKPHOS, BILITOT, PROT, ALBUMIN in the last 168 hours. No results for input(s): LIPASE, AMYLASE in the last 168 hours. No results for input(s): AMMONIA in the last 168 hours. Coagulation Profile: No results for input(s): INR, PROTIME in the last 168 hours. Cardiac Enzymes: No results for input(s): CKTOTAL, CKMB, CKMBINDEX, TROPONINI in the last 168 hours. BNP (last 3 results) No results for input(s):  PROBNP in the last 8760 hours. HbA1C: No results for input(s): HGBA1C in the last 72 hours. CBG: No results for input(s): GLUCAP in the last 168 hours. Lipid Profile: No results for input(s): CHOL, HDL, LDLCALC, TRIG, CHOLHDL, LDLDIRECT in the last 72 hours. Thyroid Function Tests: No results for input(s): TSH, T4TOTAL, FREET4, T3FREE, THYROIDAB in the last 72 hours. Anemia Panel: No results for input(s): VITAMINB12, FOLATE, FERRITIN, TIBC, IRON, RETICCTPCT in the last 72 hours. Urine analysis:  Component Value Date/Time   BILIRUBINUR neg 06/25/2011 1726   PROTEINUR 30mg  06/25/2011 1726   UROBILINOGEN 0.2 06/25/2011 1726   NITRITE neg 06/25/2011 1726   LEUKOCYTESUR Negative 06/25/2011 1726   Sepsis Labs: @LABRCNTIP (procalcitonin:4,lacticidven:4) )No results found for this or any previous visit (from the past 240 hour(s)).   Radiological Exams on Admission: Ct Abdomen Pelvis Wo Contrast  08/13/2015  CLINICAL DATA:  Lower abdominal pain and diarrhea. Decreased hemoglobin. History of polycystic kidney disease. EXAM: CT ABDOMEN AND PELVIS WITHOUT CONTRAST TECHNIQUE: Multidetector CT imaging of the abdomen and pelvis was performed following the standard protocol without IV contrast. COMPARISON:  05/05/2013 FINDINGS: Atelectasis in the lung bases. Cardiac enlargement. Small pericardial effusion. Diffusely enlarged polycystic kidneys. Multiple hepatic cysts. Findings are unchanged since prior study and consistent with autosomal dominant polycystic renal disease. The unenhanced appearance of the gallbladder, pancreas, spleen, adrenal glands, abdominal aorta, inferior vena cava, and retroperitoneal lymph nodes is unremarkable. No abnormal abdominal or retroperitoneal fluid collections. Stomach, small bowel, and colon are decompressed. No free air or free fluid in the abdomen. Pelvis: Bladder wall is not thickened. Appendix is not identified. No inflammatory changes demonstrated in the sigmoid  colon. No free or loculated pelvic fluid collections. No pelvic mass or lymphadenopathy. There is infiltration in the right groin region consistent with small hematoma, measuring about 2.3 cm maximal diameter. Has there been any history of vascular procedure to the right groin? No destructive bone lesions. IMPRESSION: Enlarged and polycystic kidneys and liver consistent with autosomal dominant polycystic kidney disease. No evidence of retroperitoneal hematoma or bowel obstruction. Small hematoma in the right groin region. Electronically Signed   By: Lucienne Capers M.D.   On: 08/13/2015 23:24   Dg Chest 2 View  08/13/2015  CLINICAL DATA:  Shortness of breath in a dialysis patient. EXAM: CHEST  2 VIEW COMPARISON:  PA and lateral chest 09/01/2011 and 07/17/2015. FINDINGS: The lungs appear emphysematous. Pulmonary edema seen on the most recent examination is markedly improved. Chronic coarsening of the pulmonary interstitium in the lingula, right middle lobe and lower lobes is not notably changed. Heart size is upper normal. Atherosclerosis is seen. IMPRESSION: Emphysema and chronic interstitial change.  No acute disease. Electronically Signed   By: Inge Rise M.D.   On: 08/13/2015 19:01    EKG: Independently reviewed. Normal sinus rhythm with IVCD atypical RBBB.  Assessment/Plan Principal Problem:   Acute respiratory failure with hypoxia (HCC) Active Problems:   End stage renal disease (HCC)   Polycystic kidney disease   SOB (shortness of breath)   Normocytic normochromic anemia    1. Acute respiratory failure with hypoxia/dyspnea - cause not clear. Patient does not look fluid overloaded. Patient's symptoms are more on lying down. 2-D echo done last month showing nonrheumatic mitral valve regurgitation and EF of 35-40%. Fluid management per nephrologist. Will check d-dimer cycle cardiac markers. No signs of any transfusion related lung injury. Closely observe. 2. Anemia - closely follow CBC.  Has received 1 unit of packed red blood cell transfusion yesterday. 3. ESRD on hemodialysis on Monday Wednesday and Friday - consult nephrology to see if patient may need dialysis on Tuesday. 4. CAD status post stenting in 07/23/2015 last month - continue antiplatelet agents statins and beta blockers. 5. Systolic CHF - continue lisinopril. 6. Adult polycystic kidney disease. 7. Right groin hematoma - closely observe. As per patient cardiology had done sonogram at [facility] yesterday, which as per the patient was not showing anything acute. 8. Hypertension -  continue present medications.   DVT prophylaxis: SCDs. Code Status: Full code.  Family Communication: No family at the bedside.  Disposition Plan: Home.  Consults called: None.  Admission status: Observation. Telemetry.    Rise Patience MD [facility] Pager [phone].  If 7PM-7AM, please contact night-coverage [email or web address] Password [email or web address]  08/14/2015, 4:01 AM

## 2015-08-14 NOTE — Care Management Obs Status (Signed)
[facility] NOTIFICATION   Patient Details  Name: Kendra Clay MRN: [id number] Date of Birth: Feb 03, 1961   Medicare Observation Status Notification Given:  Yes    Vernor Monnig, Rory Percy, RN 08/14/2015, 3:34 PM

## 2015-08-14 NOTE — Progress Notes (Signed)
Patient ID: SEMIA KAPPEL, female   DOB: 14-Jan-1961, 55 y.o.   MRN: [id number]    PROGRESS NOTE    RHIANNE FRADETTE  [id number] DOB: [birth year] DOA: 08/13/2015  PCP: No primary care provider on file.   Brief Narrative:  [name] with ESRD on HD, presented for evaluation of sudden onset of dyspnea after receiving one unit of PRBC. Please see earlier admission note by Dr. Hal Hope. Pt was admitted after midnight.   Assessment & Plan: 1. Acute respiratory failure with hypoxia/dyspnea - Patient does not look fluid overloaded. Patient's symptoms are more on lying down. 2-D echo done last month showing nonrheumatic mitral valve regurgitation and EF of 35-40%. Fluid management per nephrologist. 2. Anemia of chronic disease, thrombocytopenia - had one U PRBC yesterday, CBC in AM 3. Elevated troponins - no chest pain this AM, troponins possibly elevated from demand ischemia, will repeat ECHO  4. ESRD on hemodialysis on Monday Wednesday and Friday, per nephrology  5. CAD status post stenting in 07/23/2015 last month - continue antiplatelet agents statins and beta blockers. 6. Chronic Systolic CHF - continue lisinopril. 7. Right groin hematoma - closely observe. As per patient cardiology had done sonogram at [facility] yesterday, which as per the patient was not showing anything acute.  DVT prophylaxis: SCD Code Status: Full  Family Communication: Patient at bedside  Disposition Plan: Home in 1-2 days  Consultants:   Nephrology   Procedures:   None  Antimicrobials:   None   Subjective: No events overnight, pt reports feeling better.   Objective: Filed Vitals:   08/14/15 0130 08/14/15 0159 08/14/15 0601 08/14/15 0818  BP: 134/90 148/87 143/66 141/75  Pulse: 86 86 83 78  Temp:  97.7 F (36.5 C) 98.3 F (36.8 C) 97.8 F (36.6 C)  TempSrc:  Oral Oral Oral  Resp: 25 20 19 18   Height:  5\' 3"  (1.6 m)    Weight:  54.7 kg (120 lb 9.5 oz)    SpO2: 100% 100%  98% 98%    Intake/Output Summary (Last 24 hours) at 08/14/15 1548 Last data filed at 08/14/15 1300  Gross per 24 hour  Intake    240 ml  Output      0 ml  Net    240 ml   Filed Weights   08/14/15 0159  Weight: 54.7 kg (120 lb 9.5 oz)    Examination:  General exam: Appears calm and comfortable  Respiratory system: Respiratory effort normal. Cardiovascular system: RRR. No JVD, murmurs, rubs, gallops or clicks. No pedal edema. Gastrointestinal system: Abdomen is nondistended, soft and nontender. No organomegaly or masses felt.  Central nervous system: Alert and oriented. No focal neurological deficits.  Data Reviewed: I have personally reviewed following labs and imaging studies  CBC:  Recent Labs Lab 08/13/15 1837 08/14/15 0435  WBC 4.0 3.7*  NEUTROABS  --  2.8  HGB 9.2* 7.9*  HCT 28.2* 24.3*  MCV 92.8 92.0  PLT 123* [id number]*   Basic Metabolic Panel:  Recent Labs Lab 08/13/15 1837 08/14/15 0435  NA 133* 135  K 3.3* 3.4*  CL 94* 96*  CO2 30 31  GLUCOSE 134* 127*  BUN 15 24*  CREATININE 4.94* 6.17*  CALCIUM 10.2 10.2   Liver Function Tests:  Recent Labs Lab 08/14/15 0435  AST 19  ALT 12*  ALKPHOS 58  BILITOT 1.7*  PROT 7.0  ALBUMIN 3.4*   Coagulation Profile:  Recent Labs Lab 08/14/15 0435  INR 1.21  Cardiac Enzymes:  Recent Labs Lab 08/14/15 0435 08/14/15 1027  TROPONINI 0.39* 0.27*   Urine analysis:    Component Value Date/Time   BILIRUBINUR neg 06/25/2011 1726   PROTEINUR 30mg  06/25/2011 1726   UROBILINOGEN 0.2 06/25/2011 1726   NITRITE neg 06/25/2011 1726   LEUKOCYTESUR Negative 06/25/2011 1726   Recent Results (from the past 240 hour(s))  MRSA PCR Screening     Status: None   Collection Time: 08/14/15  8:25 AM  Result Value Ref Range Status   MRSA by PCR NEGATIVE NEGATIVE Final    Radiology Studies: Ct Abdomen Pelvis Wo Contrast 08/13/2015  Enlarged and polycystic kidneys and liver consistent with autosomal dominant  polycystic kidney disease. No evidence of retroperitoneal hematoma or bowel obstruction. Small hematoma in the right groin region.   Dg Chest 2 View 08/13/2015   Emphysema and chronic interstitial change.  No acute disease.   Scheduled Meds: . aspirin EC  81 mg Oral Daily  . atorvastatin  40 mg Oral Daily  . carvedilol  6.25 mg Oral BID WC  . [START ON 08/15/2015] doxercalciferol  6 mcg Intravenous Q M,W,F-HD  . isosorbide mononitrate  30 mg Oral Daily  . lisinopril  10 mg Oral Daily  . sevelamer carbonate  800 mg Oral TID WC  . sodium chloride flush  3 mL Intravenous Q12H  . ticagrelor  90 mg Oral BID   Continuous Infusions:   Time spent: 20 minutes   Faye Ramsay, MD [facility] Pager [phone]  If 7PM-7AM, please contact night-coverage [email or web address] Password [facility] 08/14/2015, 3:48 PM

## 2015-08-15 ENCOUNTER — Observation Stay (HOSPITAL_COMMUNITY): Payer: Medicare Other

## 2015-08-15 DIAGNOSIS — J9601 Acute respiratory failure with hypoxia: Secondary | ICD-10-CM | POA: Diagnosis present

## 2015-08-15 DIAGNOSIS — Z7982 Long term (current) use of aspirin: Secondary | ICD-10-CM | POA: Diagnosis not present

## 2015-08-15 DIAGNOSIS — I2510 Atherosclerotic heart disease of native coronary artery without angina pectoris: Secondary | ICD-10-CM | POA: Diagnosis present

## 2015-08-15 DIAGNOSIS — Y840 Cardiac catheterization as the cause of abnormal reaction of the patient, or of later complication, without mention of misadventure at the time of the procedure: Secondary | ICD-10-CM | POA: Diagnosis present

## 2015-08-15 DIAGNOSIS — D631 Anemia in chronic kidney disease: Secondary | ICD-10-CM | POA: Diagnosis present

## 2015-08-15 DIAGNOSIS — R0602 Shortness of breath: Secondary | ICD-10-CM | POA: Diagnosis present

## 2015-08-15 DIAGNOSIS — I248 Other forms of acute ischemic heart disease: Secondary | ICD-10-CM | POA: Diagnosis present

## 2015-08-15 DIAGNOSIS — N186 End stage renal disease: Secondary | ICD-10-CM

## 2015-08-15 DIAGNOSIS — D638 Anemia in other chronic diseases classified elsewhere: Secondary | ICD-10-CM | POA: Diagnosis present

## 2015-08-15 DIAGNOSIS — I132 Hypertensive heart and chronic kidney disease with heart failure and with stage 5 chronic kidney disease, or end stage renal disease: Secondary | ICD-10-CM | POA: Diagnosis present

## 2015-08-15 DIAGNOSIS — Z87891 Personal history of nicotine dependence: Secondary | ICD-10-CM | POA: Diagnosis not present

## 2015-08-15 DIAGNOSIS — Z992 Dependence on renal dialysis: Secondary | ICD-10-CM | POA: Diagnosis not present

## 2015-08-15 DIAGNOSIS — M419 Scoliosis, unspecified: Secondary | ICD-10-CM | POA: Diagnosis present

## 2015-08-15 DIAGNOSIS — R0600 Dyspnea, unspecified: Secondary | ICD-10-CM | POA: Diagnosis not present

## 2015-08-15 DIAGNOSIS — Z7901 Long term (current) use of anticoagulants: Secondary | ICD-10-CM | POA: Diagnosis not present

## 2015-08-15 DIAGNOSIS — I97630 Postprocedural hematoma of a circulatory system organ or structure following a cardiac catheterization: Secondary | ICD-10-CM | POA: Diagnosis present

## 2015-08-15 DIAGNOSIS — Z955 Presence of coronary angioplasty implant and graft: Secondary | ICD-10-CM | POA: Diagnosis not present

## 2015-08-15 DIAGNOSIS — I5022 Chronic systolic (congestive) heart failure: Secondary | ICD-10-CM | POA: Diagnosis present

## 2015-08-15 DIAGNOSIS — D696 Thrombocytopenia, unspecified: Secondary | ICD-10-CM | POA: Diagnosis present

## 2015-08-15 DIAGNOSIS — I340 Nonrheumatic mitral (valve) insufficiency: Secondary | ICD-10-CM | POA: Diagnosis present

## 2015-08-15 DIAGNOSIS — Q612 Polycystic kidney, adult type: Secondary | ICD-10-CM | POA: Diagnosis not present

## 2015-08-15 LAB — PREPARE RBC (CROSSMATCH)

## 2015-08-15 LAB — CBC
HCT: 22.7 % — ABNORMAL LOW (ref 36.0–46.0)
HEMATOCRIT: 24 % — AB (ref 36.0–46.0)
HEMOGLOBIN: 7.6 g/dL — AB (ref 12.0–15.0)
HEMOGLOBIN: 7.7 g/dL — AB (ref 12.0–15.0)
MCH: 30.5 pg (ref 26.0–34.0)
MCH: 29.3 pg (ref 26.0–34.0)
MCHC: 33.5 g/dL (ref 30.0–36.0)
MCHC: 32.1 g/dL (ref 30.0–36.0)
MCV: 91.2 fL (ref 78.0–100.0)
MCV: 91.3 fL (ref 78.0–100.0)
Platelets: 97 10*3/uL — ABNORMAL LOW (ref 150–400)
Platelets: 109 10*3/uL — ABNORMAL LOW (ref 150–400)
RBC: 2.49 MIL/uL — AB (ref 3.87–5.11)
RBC: 2.63 MIL/uL — ABNORMAL LOW (ref 3.87–5.11)
RDW: 16.7 % — ABNORMAL HIGH (ref 11.5–15.5)
RDW: 16.6 % — ABNORMAL HIGH (ref 11.5–15.5)
WBC: 5 10*3/uL (ref 4.0–10.5)
WBC: 4.7 10*3/uL (ref 4.0–10.5)

## 2015-08-15 LAB — RENAL FUNCTION PANEL
Albumin: 2.9 g/dL — ABNORMAL LOW (ref 3.5–5.0)
Albumin: 3.1 g/dL — ABNORMAL LOW (ref 3.5–5.0)
Anion gap: 10 (ref 5–15)
Anion gap: 11 (ref 5–15)
BUN: 41 mg/dL — AB (ref 6–20)
BUN: 43 mg/dL — ABNORMAL HIGH (ref 6–20)
CALCIUM: 9.8 mg/dL (ref 8.9–10.3)
CHLORIDE: 93 mmol/L — AB (ref 101–111)
CO2: 29 mmol/L (ref 22–32)
CO2: 27 mmol/L (ref 22–32)
CREATININE: 7.88 mg/dL — AB (ref 0.44–1.00)
Calcium: 9.9 mg/dL (ref 8.9–10.3)
Chloride: 95 mmol/L — ABNORMAL LOW (ref 101–111)
Creatinine, Ser: 8.26 mg/dL — ABNORMAL HIGH (ref 0.44–1.00)
GFR calc Af Amer: 6 mL/min — ABNORMAL LOW (ref >60)
GFR calc Af Amer: 6 mL/min — ABNORMAL LOW (ref >60)
GFR calc non Af Amer: 5 mL/min — ABNORMAL LOW (ref >60)
GFR calc non Af Amer: 5 mL/min — ABNORMAL LOW (ref >60)
Glucose, Bld: 98 mg/dL (ref 65–99)
Glucose, Bld: 102 mg/dL — ABNORMAL HIGH (ref 65–99)
PHOSPHORUS: 3.9 mg/dL (ref 2.5–4.6)
POTASSIUM: 3.2 mmol/L — AB (ref 3.5–5.1)
Phosphorus: 4 mg/dL (ref 2.5–4.6)
Potassium: 3.4 mmol/L — ABNORMAL LOW (ref 3.5–5.1)
Sodium: 132 mmol/L — ABNORMAL LOW (ref 135–145)
Sodium: 133 mmol/L — ABNORMAL LOW (ref 135–145)

## 2015-08-15 LAB — ECHOCARDIOGRAM COMPLETE
HEIGHTINCHES: 63 in
WEIGHTICAEL: 1865.97 [oz_av]

## 2015-08-15 LAB — RETICULOCYTES
RBC.: 2.63 MIL/uL — AB (ref 3.87–5.11)
RETIC COUNT ABSOLUTE: 144.7 10*3/uL (ref 19.0–186.0)
Retic Ct Pct: 5.5 % — ABNORMAL HIGH (ref 0.4–3.1)

## 2015-08-15 LAB — FERRITIN: Ferritin: 839 ng/mL — ABNORMAL HIGH (ref 11–307)

## 2015-08-15 LAB — FOLATE: FOLATE: 6.2 ng/mL (ref >5.9)

## 2015-08-15 LAB — IRON AND TIBC
Iron: 67 ug/dL (ref 28–170)
Saturation Ratios: 28 % (ref 10.4–31.8)
TIBC: 239 ug/dL — ABNORMAL LOW (ref 250–450)
UIBC: 172 ug/dL

## 2015-08-15 LAB — VITAMIN B12: VITAMIN B 12: 237 pg/mL (ref 180–914)

## 2015-08-15 MED ORDER — SODIUM CHLORIDE 0.9 % IV SOLN: 100.0000 mL | INTRAVENOUS | Status: DC | PRN

## 2015-08-15 MED ORDER — HEPARIN SODIUM (PORCINE) 1000 UNIT/ML DIALYSIS: 1000.0000 [IU] | INTRAMUSCULAR | Status: DC | PRN

## 2015-08-15 MED ORDER — PENTAFLUOROPROP-TETRAFLUOROETH EX AERO: 1.0000 "application " | INHALATION_SPRAY | CUTANEOUS | Status: DC | PRN

## 2015-08-15 MED ORDER — DOXERCALCIFEROL 4 MCG/2ML IV SOLN
INTRAVENOUS | Status: AC
  Filled 2015-08-15: qty 2

## 2015-08-15 MED ORDER — ALTEPLASE 2 MG IJ SOLR: 2.0000 mg | Freq: Once | INTRAMUSCULAR | Status: DC | PRN

## 2015-08-15 MED ORDER — LIDOCAINE HCL (PF) 1 % IJ SOLN: 5.0000 mL | INTRAMUSCULAR | Status: DC | PRN

## 2015-08-15 MED ORDER — SODIUM CHLORIDE 0.9 % IV SOLN: Freq: Once | INTRAVENOUS | Status: DC

## 2015-08-15 MED ORDER — DOXERCALCIFEROL 4 MCG/2ML IV SOLN
INTRAVENOUS | Status: AC
  Administered 2015-08-15: 6 ug via INTRAVENOUS
  Filled 2015-08-15: qty 2

## 2015-08-15 MED ORDER — LIDOCAINE-PRILOCAINE 2.5-2.5 % EX CREA: 1.0000 "application " | TOPICAL_CREAM | CUTANEOUS | Status: DC | PRN

## 2015-08-15 NOTE — Procedures (Signed)
  I was present at this dialysis session, have reviewed the session itself and made  appropriate changes Kelly Splinter MD [place] pager [phone]    cell [phone] 08/15/2015, 10:07 AM

## 2015-08-15 NOTE — Progress Notes (Signed)
Patient ID: Kendra Clay, female   DOB: [birth year], 55 y.o.   MRN: [id number]    PROGRESS NOTE    Kendra Clay  [id number] DOB: 05/20/1960 DOA: 08/13/2015  PCP: No primary care provider on file.   Brief Narrative:  [name] with ESRD on HD, presented for evaluation of sudden onset of dyspnea after receiving one unit of PRBC. Please see earlier admission note by Dr. Hal Hope. Pt was admitted after midnight.   Assessment & Plan: 1. Acute respiratory failure with hypoxia/dyspnea - better this AM, oxygen saturations at target range, monitor for now. ECHO pending  2. Anemia of chronic disease, thrombocytopenia - had one U PRBC on admission, Hg still low, transfuse two units with HD today, anemia panel pending, FOBT also pending. Further rec's once these test results are back.  3. Elevated troponins - no chest pain this AM, troponins possibly elevated from acute anemia in the setting of ESRD, ECHO pending, further rec;s once results back, may need cardio consult  4. ESRD on hemodialysis on Monday Wednesday and Friday, per nephrology  5. CAD status post stenting in 07/23/2015 last month - continue antiplatelet agents statins and beta blockers. 6. Chronic Systolic CHF - continue lisinopril. 7. Right groin hematoma - closely observe. As per patient cardiology had done sonogram at [facility] yesterday, which as per the patient was not showing anything acute.  DVT prophylaxis: SCD Code Status: Full  Family Communication: Patient at bedside  Disposition Plan: Home in 1-2 days  Consultants:   None  Procedures:   None  Antimicrobials:   None   Subjective: No events overnight, pt reports feeling better.   Objective: Filed Vitals:   08/15/15 1101 08/15/15 1110 08/15/15 1125 08/15/15 1141  BP: 140/83 140/83 164/81 150/82  Pulse: 71 71 76 73  Temp: 98 F (36.7 C) 98 F (36.7 C) 98 F (36.7 C) 98 F (36.7 C)  TempSrc: Oral Oral Oral Oral  Resp: 24 12 22  21   Height:      Weight:    52.9 kg (116 lb 10 oz)  SpO2: 100% 100% 100% 100%    Intake/Output Summary (Last 24 hours) at 08/15/15 1242 Last data filed at 08/15/15 1141  Gross per 24 hour  Intake   1180 ml  Output   2798 ml  Net  -1618 ml   Filed Weights   08/14/15 2036 08/15/15 0820 08/15/15 1141  Weight: 54.8 kg (120 lb 13 oz) 54.4 kg (119 lb 14.9 oz) 52.9 kg (116 lb 10 oz)    Examination:  General exam: Appears calm and comfortable  Respiratory system: Respiratory effort normal. Cardiovascular system: RRR. No JVD, murmurs, rubs, gallops or clicks. No pedal edema. Gastrointestinal system: Abdomen is nondistended, soft and nontender. No organomegaly or masses felt.  Central nervous system: Alert and oriented. No focal neurological deficits.  Data Reviewed: I have personally reviewed following labs and imaging studies  CBC:  Recent Labs Lab 08/13/15 1837 08/14/15 0435 08/15/15 0350 08/15/15 0900  WBC 4.0 3.7* 5.0 4.7  NEUTROABS  --  2.8  --   --   HGB 9.2* 7.9* 7.6* 7.7*  HCT 28.2* 24.3* 22.7* 24.0*  MCV 92.8 92.0 91.2 91.3  PLT 123* 111* 97* 0000000*   Basic Metabolic Panel:  Recent Labs Lab 08/13/15 1837 08/14/15 0435 08/15/15 0350 08/15/15 0900  NA 133* 135 132* 133*  K 3.3* 3.4* 3.2* 3.4*  CL 94* 96* 93* 95*  CO2 30 31 29  27  GLUCOSE 134* 127* 98 102*  BUN 15 24* 41* 43*  CREATININE 4.94* 6.17* 7.88* 8.26*  CALCIUM 10.2 10.2 9.8 9.9  PHOS  --   --  3.9 4.0   Liver Function Tests:  Recent Labs Lab 08/14/15 0435 08/15/15 0350 08/15/15 0900  AST 19  --   --   ALT 12*  --   --   ALKPHOS 58  --   --   BILITOT 1.7*  --   --   PROT 7.0  --   --   ALBUMIN 3.4* 2.9* 3.1*   Coagulation Profile:  Recent Labs Lab 08/14/15 0435  INR 1.21   Cardiac Enzymes:  Recent Labs Lab 08/14/15 0435 08/14/15 1027 08/14/15 1555  TROPONINI 0.39* 0.27* 0.19*   Urine analysis:    Component Value Date/Time   BILIRUBINUR neg 06/25/2011 1726   PROTEINUR  30mg  06/25/2011 1726   UROBILINOGEN 0.2 06/25/2011 1726   NITRITE neg 06/25/2011 1726   LEUKOCYTESUR Negative 06/25/2011 1726   Recent Results (from the past 240 hour(s))  MRSA PCR Screening     Status: None   Collection Time: 08/14/15  8:25 AM  Result Value Ref Range Status   MRSA by PCR NEGATIVE NEGATIVE Final    Radiology Studies: Ct Abdomen Pelvis Wo Contrast 08/13/2015  Enlarged and polycystic kidneys and liver consistent with autosomal dominant polycystic kidney disease. No evidence of retroperitoneal hematoma or bowel obstruction. Small hematoma in the right groin region.   Dg Chest 2 View 08/13/2015   Emphysema and chronic interstitial change.  No acute disease.   Scheduled Meds: . sodium chloride   Intravenous Once  . aspirin EC  81 mg Oral Daily  . atorvastatin  40 mg Oral Daily  . carvedilol  6.25 mg Oral BID WC  . doxercalciferol  6 mcg Intravenous Q M,W,F-HD  . isosorbide mononitrate  30 mg Oral Daily  . lisinopril  10 mg Oral Daily  . sevelamer carbonate  800 mg Oral TID WC  . sodium chloride flush  3 mL Intravenous Q12H  . ticagrelor  90 mg Oral BID   Continuous Infusions:   Time spent: 20 minutes   Faye Ramsay, MD [facility] Pager [phone]  If 7PM-7AM, please contact night-coverage [email or web address] Password [facility] 08/15/2015, 12:42 PM

## 2015-08-15 NOTE — Progress Notes (Addendum)
Pt with ESRD on MWF HD in [place] admitted with SOB to OBS status.   She was hypoxemic.  CXR and CT show pretty significant COPD.  She quit smoking 2 yrs ago.  She may have a little fluid on CT in the bases, not the major issue, though.  Suspect COPD is her primary issue.  She has massive bilat kidney enlargement (ADPKD) which could be pushing up on the diaphragms as well.  Also anemic, got one unit on Monday at [facility], will transfuse 2u prbc's today.  On HD 3 years.  Would have her see pulm for COPD in OP setting.  Due for ECHO today.    Dialysis orders written for Wed. Please advise if admitted then will do full consult.  HD [facility] MWF 3 hr EDW 54.5 2 K 2.25 Ca no heparin right lower AVF hectorol 6 Mircera 225 q 2 weeks due 7/19 Qb 350/A 1.5  Kelly Splinter MD [place] pager [phone]    cell [phone] 08/15/2015, 10:05 AM

## 2015-08-15 NOTE — Progress Notes (Signed)
  Echocardiogram 2D Echocardiogram has been performed.  [place] Nickel 08/15/2015, 3:49 PM

## 2015-08-16 LAB — TYPE AND SCREEN
ABO/RH(D): O POS
ANTIBODY SCREEN: NEGATIVE
UNIT DIVISION: 0
Unit division: 0

## 2015-08-16 LAB — RENAL FUNCTION PANEL
Albumin: 3.2 g/dL — ABNORMAL LOW (ref 3.5–5.0)
Anion gap: 9 (ref 5–15)
BUN: 30 mg/dL — ABNORMAL HIGH (ref 6–20)
CHLORIDE: 97 mmol/L — AB (ref 101–111)
CO2: 27 mmol/L (ref 22–32)
Calcium: 10 mg/dL (ref 8.9–10.3)
Creatinine, Ser: 5.36 mg/dL — ABNORMAL HIGH (ref 0.44–1.00)
GFR, EST AFRICAN AMERICAN: 10 mL/min — AB (ref >60)
GFR, EST NON AFRICAN AMERICAN: 8 mL/min — AB (ref >60)
Glucose, Bld: 88 mg/dL (ref 65–99)
POTASSIUM: 3.8 mmol/L (ref 3.5–5.1)
Phosphorus: 3 mg/dL (ref 2.5–4.6)
Sodium: 133 mmol/L — ABNORMAL LOW (ref 135–145)

## 2015-08-16 NOTE — Discharge Summary (Signed)
Physician Discharge Summary  Kendra Clay [id number] DOB: February 22, 1960 DOA: 08/13/2015  PCP: No primary care provider on file.  Admit date: 08/13/2015 Discharge date: 08/16/2015  Recommendations for Outpatient Follow-up:  1. Pt will need to follow up with PCP in 1-2 weeks post discharge 2. Please also check CBC to evaluate Hg and Hct levels  Discharge Diagnoses:  Principal Problem:   Acute respiratory failure with hypoxia (HCC) Active Problems:   End stage renal disease (HCC)   Polycystic kidney disease   SOB (shortness of breath)   Normocytic normochromic anemia  Discharge Condition: Stable  Diet recommendation: renal diet  Brief Narrative:  [name] with ESRD on HD, presented for evaluation of sudden onset of dyspnea after receiving one unit of PRBC. Please see earlier admission note by Dr. Hal Hope. Pt was admitted after midnight.   Assessment & Plan: 1. Acute respiratory failure with hypoxia/dyspnea - oxygen saturations at target range, wants to go home 2. Anemia of chronic disease, thrombocytopenia - had one U PRBC on admission, transfused two units with HD today, Hg up, no further signs of bleeding 3. Elevated troponins - no chest pain this AM, troponins possibly elevated from acute anemia in the setting of ESRD, no chest pain this AM, wants to go home  4. ESRD on hemodialysis on Monday Wednesday and Friday, per nephrology  5. CAD status post stenting in 07/23/2015 last month - continue antiplatelet agents statins and beta blockers. 6. Chronic Systolic CHF - continue lisinopril. 7. Right groin hematoma - as per patient cardiology had done sonogram at [facility], which as per the patient was not showing anything acute.  DVT prophylaxis: SCD Code Status: Full  Family Communication: Patient at bedside  Disposition Plan: Home   Consultants:   None  Procedures:   None  Antimicrobials:   None   Discharge Exam: Filed Vitals:   08/16/15  0458 08/16/15 1000  BP: 123/73 115/66  Pulse: 73 73  Temp: 97.7 F (36.5 C) 97.3 F (36.3 C)  Resp: 19 18   Filed Vitals:   08/15/15 1702 08/15/15 2025 08/16/15 0458 08/16/15 1000  BP: 152/85 137/85 123/73 115/66  Pulse: 87 80 73 73  Temp: 99.8 F (37.7 C) 98.5 F (36.9 C) 97.7 F (36.5 C) 97.3 F (36.3 C)  TempSrc: Oral Oral Oral Oral  Resp: 20 18 19 18   Height:      Weight:  52.4 kg (115 lb 8.3 oz)    SpO2: 100% 98% 100% 98%    General: Pt is alert, follows commands appropriately, not in acute distress Cardiovascular: Regular rate and rhythm, S1/S2 +, no murmurs, no rubs, no gallops Respiratory: Clear to auscultation bilaterally, no wheezing, no crackles, no rhonchi Abdominal: Soft, non tender, non distended, bowel sounds +, no guarding  Discharge Instructions  Discharge Instructions    Diet - low sodium heart healthy    Complete by:  As directed      Increase activity slowly    Complete by:  As directed             Medication List    TAKE these medications        aspirin EC 81 MG tablet  Take 81 mg by mouth daily.     atorvastatin 40 MG tablet  Commonly known as:  LIPITOR  Take 40 mg by mouth daily.     carvedilol 6.25 MG tablet  Commonly known as:  COREG  Take 6.25 mg by mouth 2 (  two) times daily with a meal.     cyclobenzaprine 5 MG tablet  Commonly known as:  FLEXERIL  Take 5 mg by mouth 3 (three) times daily as needed for muscle spasms.     isosorbide mononitrate 30 MG 24 hr tablet  Commonly known as:  IMDUR  Take 30 mg by mouth daily.     lisinopril 10 MG tablet  Commonly known as:  PRINIVIL,ZESTRIL  Take 10 mg by mouth daily.     loratadine 10 MG tablet  Commonly known as:  CLARITIN  Take 10 mg by mouth daily as needed for allergies.     nitroGLYCERIN 0.4 MG SL tablet  Commonly known as:  NITROSTAT  Place 0.4 mg under the tongue every 5 (five) minutes as needed for chest pain.     sevelamer carbonate 800 MG tablet  Commonly known  as:  RENVELA  Take 800 mg by mouth 3 (three) times daily with meals.     ticagrelor 90 MG Tabs tablet  Commonly known as:  BRILINTA  Take 90 mg by mouth 2 (two) times daily.           Follow-up Information    Follow up with Faye Ramsay, MD.   Specialty:  Internal Medicine   Contact information:   [address] [place] [address] [phone]        The results of significant diagnostics from this hospitalization (including imaging, microbiology, ancillary and laboratory) are listed below for reference.     Microbiology: Recent Results (from the past 240 hour(s))  MRSA PCR Screening     Status: None   Collection Time: 08/14/15  8:25 AM  Result Value Ref Range Status   MRSA by PCR NEGATIVE NEGATIVE Final    Comment:        The GeneXpert MRSA Assay (FDA approved for NASAL specimens only), is one component of a comprehensive MRSA colonization surveillance program. It is not intended to diagnose MRSA infection nor to guide or monitor treatment for MRSA infections.      Labs: Basic Metabolic Panel:  Recent Labs Lab 08/13/15 1837 08/14/15 0435 08/15/15 0350 08/15/15 0900 08/16/15 0526  NA 133* 135 132* 133* 133*  K 3.3* 3.4* 3.2* 3.4* 3.8  CL 94* 96* 93* 95* 97*  CO2 30 31 29 27 27   GLUCOSE 134* 127* 98 102* 88  BUN 15 24* 41* 43* 30*  CREATININE 4.94* 6.17* 7.88* 8.26* 5.36*  CALCIUM 10.2 10.2 9.8 9.9 10.0  PHOS  --   --  3.9 4.0 3.0   Liver Function Tests:  Recent Labs Lab 08/14/15 0435 08/15/15 0350 08/15/15 0900 08/16/15 0526  AST 19  --   --   --   ALT 12*  --   --   --   ALKPHOS 58  --   --   --   BILITOT 1.7*  --   --   --   PROT 7.0  --   --   --   ALBUMIN 3.4* 2.9* 3.1* 3.2*   No results for input(s): LIPASE, AMYLASE in the last 168 hours. No results for input(s): AMMONIA in the last 168 hours. CBC:  Recent Labs Lab 08/13/15 1837 08/14/15 0435 08/15/15 0350 08/15/15 0900 08/16/15 0526  WBC 4.0  3.7* 5.0 4.7 5.4  NEUTROABS  --  2.8  --   --   --   HGB 9.2* 7.9* 7.6* 7.7* 12.0  HCT 28.2* 24.3* 22.7* 24.0* 36.5  MCV  92.8 92.0 91.2 91.3 91.0  PLT 123* 111* 97* 109* 90*   Cardiac Enzymes:  Recent Labs Lab 08/14/15 0435 08/14/15 1027 08/14/15 1555  TROPONINI 0.39* 0.27* 0.19*   SIGNED: Time coordinating discharge: 30 minutes  MAGICK-Danell Vazquez, MD  [facility] 08/16/2015, 11:53 AM Pager [phone]  If 7PM-7AM, please contact night-coverage [email or web address] Password [email or web address]

## 2015-08-16 NOTE — Care Management Important Message (Signed)
Important Message  Patient Details  Name: Kendra Clay MRN: [id number] Date of Birth: 02/27/1960   Medicare Important Message Given:  Yes    Loann Quill 08/16/2015, 8:25 AM

## 2015-08-16 NOTE — Progress Notes (Signed)
Kendra Clay to be D/C'd Home per MD order.  Discussed prescriptions and follow up appointments with the patient. Prescriptions given to patient, medication list explained in detail. Pt verbalized understanding.    Medication List    TAKE these medications        aspirin EC 81 MG tablet  Take 81 mg by mouth daily.     atorvastatin 40 MG tablet  Commonly known as:  LIPITOR  Take 40 mg by mouth daily.     carvedilol 6.25 MG tablet  Commonly known as:  COREG  Take 6.25 mg by mouth 2 (two) times daily with a meal.     cyclobenzaprine 5 MG tablet  Commonly known as:  FLEXERIL  Take 5 mg by mouth 3 (three) times daily as needed for muscle spasms.     isosorbide mononitrate 30 MG 24 hr tablet  Commonly known as:  IMDUR  Take 30 mg by mouth daily.     lisinopril 10 MG tablet  Commonly known as:  PRINIVIL,ZESTRIL  Take 10 mg by mouth daily.     loratadine 10 MG tablet  Commonly known as:  CLARITIN  Take 10 mg by mouth daily as needed for allergies.     nitroGLYCERIN 0.4 MG SL tablet  Commonly known as:  NITROSTAT  Place 0.4 mg under the tongue every 5 (five) minutes as needed for chest pain.     sevelamer carbonate 800 MG tablet  Commonly known as:  RENVELA  Take 800 mg by mouth 3 (three) times daily with meals.     ticagrelor 90 MG Tabs tablet  Commonly known as:  BRILINTA  Take 90 mg by mouth 2 (two) times daily.        Filed Vitals:   08/16/15 0458 08/16/15 1000  BP: 123/73 115/66  Pulse: 73 73  Temp: 97.7 F (36.5 C) 97.3 F (36.3 C)  Resp: 19 18    Skin clean, dry and intact without evidence of skin break down, no evidence of skin tears noted. IV catheter discontinued intact. Site without signs and symptoms of complications. Dressing and pressure applied. Pt denies pain at this time. No complaints noted.  An After Visit Summary was printed and given to the patient. Patient escorted via [place], and D/C home via private auto.  Haywood Lasso BSN, RN [facility]  [address] Phone [phone]

## 2015-08-16 NOTE — Discharge Instructions (Signed)
Anemia, Nonspecific Anemia is a condition in which the concentration of red blood cells or hemoglobin in the blood is below normal. Hemoglobin is a substance in red blood cells that carries oxygen to the tissues of the body. Anemia results in not enough oxygen reaching these tissues.  CAUSES  Common causes of anemia include:   Excessive bleeding. Bleeding may be internal or external. This includes excessive bleeding from periods (in women) or from the intestine.   Poor nutrition.   Chronic kidney, thyroid, and liver disease.  Bone marrow disorders that decrease red blood cell production.  Cancer and treatments for cancer.  HIV, AIDS, and their treatments.  Spleen problems that increase red blood cell destruction.  Blood disorders.  Excess destruction of red blood cells due to infection, medicines, and autoimmune disorders. SIGNS AND SYMPTOMS   Minor weakness.   Dizziness.   Headache.  Palpitations.   Shortness of breath, especially with exercise.   Paleness.  Cold sensitivity.  Indigestion.  Nausea.  Difficulty sleeping.  Difficulty concentrating. Symptoms may occur suddenly or they may develop slowly.  DIAGNOSIS  Additional blood tests are often needed. These help your health care provider determine the best treatment. Your health care provider will check your stool for blood and look for other causes of blood loss.  TREATMENT  Treatment varies depending on the cause of the anemia. Treatment can include:   Supplements of iron, vitamin B12, or folic acid.   Hormone medicines.   A blood transfusion. This may be needed if blood loss is severe.   Hospitalization. This may be needed if there is significant continual blood loss.   Dietary changes.  Spleen removal. HOME CARE INSTRUCTIONS Keep all follow-up appointments. It often takes many weeks to correct anemia, and having your health care provider check on your condition and your response to  treatment is very important. SEEK IMMEDIATE MEDICAL CARE IF:   You develop extreme weakness, shortness of breath, or chest pain.   You become dizzy or have trouble concentrating.  You develop heavy vaginal bleeding.   You develop a rash.   You have bloody or black, tarry stools.   You faint.   You vomit up blood.   You vomit repeatedly.   You have abdominal pain.  You have a fever or persistent symptoms for more than 2-3 days.   You have a fever and your symptoms suddenly get worse.   You are dehydrated.  MAKE SURE YOU:  Understand these instructions.  Will watch your condition.  Will get help right away if you are not doing well or get worse.   This information is not intended to replace advice given to you by your health care provider. Make sure you discuss any questions you have with your health care provider.   Document Released: 02/28/2004 Document Revised: 09/22/2012 Document Reviewed: 07/16/2012 Elsevier Interactive Patient Education 2016 Elsevier Inc.  

## 2015-08-20 LAB — CBC
HEMATOCRIT: 36.5 % (ref 36.0–46.0)
HEMOGLOBIN: 12 g/dL (ref 12.0–15.0)
MCH: 29.9 pg (ref 26.0–34.0)
MCHC: 32.9 g/dL (ref 30.0–36.0)
MCV: 91 fL (ref 78.0–100.0)
Platelets: 90 10*3/uL — ABNORMAL LOW (ref 150–400)
RBC: 4.01 MIL/uL (ref 3.87–5.11)
RDW: 16.4 % — ABNORMAL HIGH (ref 11.5–15.5)
WBC: 5.4 10*3/uL (ref 4.0–10.5)

## 2016-02-24 ENCOUNTER — Emergency Department (HOSPITAL_COMMUNITY)
Admission: EM | Admit: 2016-02-24 | Discharge: 2016-02-24 | Disposition: A | Discharge status: Home / Self Care | Payer: Medicare Other | Attending: Emergency Medicine | Admitting: Emergency Medicine

## 2016-02-24 ENCOUNTER — Encounter (HOSPITAL_COMMUNITY): Payer: Self-pay

## 2016-02-24 DIAGNOSIS — R319 Hematuria, unspecified: Secondary | ICD-10-CM | POA: Diagnosis present

## 2016-02-24 DIAGNOSIS — Z7982 Long term (current) use of aspirin: Secondary | ICD-10-CM | POA: Insufficient documentation

## 2016-02-24 DIAGNOSIS — N186 End stage renal disease: Secondary | ICD-10-CM | POA: Insufficient documentation

## 2016-02-24 DIAGNOSIS — Z79899 Other long term (current) drug therapy: Secondary | ICD-10-CM | POA: Insufficient documentation

## 2016-02-24 DIAGNOSIS — I120 Hypertensive chronic kidney disease with stage 5 chronic kidney disease or end stage renal disease: Secondary | ICD-10-CM | POA: Insufficient documentation

## 2016-02-24 DIAGNOSIS — Z992 Dependence on renal dialysis: Secondary | ICD-10-CM | POA: Diagnosis not present

## 2016-02-24 DIAGNOSIS — N12 Tubulo-interstitial nephritis, not specified as acute or chronic: Secondary | ICD-10-CM | POA: Diagnosis not present

## 2016-02-24 DIAGNOSIS — Z87891 Personal history of nicotine dependence: Secondary | ICD-10-CM | POA: Diagnosis not present

## 2016-02-24 LAB — COMPREHENSIVE METABOLIC PANEL
ALT: 10 U/L — ABNORMAL LOW (ref 14–54)
AST: 13 U/L — AB (ref 15–41)
Albumin: 3.7 g/dL (ref 3.5–5.0)
Alkaline Phosphatase: 80 U/L (ref 38–126)
Anion gap: 9 (ref 5–15)
BUN: 60 mg/dL — AB (ref 6–20)
CO2: 30 mmol/L (ref 22–32)
Calcium: 10.1 mg/dL (ref 8.9–10.3)
Chloride: 94 mmol/L — ABNORMAL LOW (ref 101–111)
Creatinine, Ser: 9.48 mg/dL — ABNORMAL HIGH (ref 0.44–1.00)
GFR calc Af Amer: 5 mL/min — ABNORMAL LOW (ref >60)
GFR calc non Af Amer: 4 mL/min — ABNORMAL LOW (ref >60)
Glucose, Bld: 79 mg/dL (ref 65–99)
POTASSIUM: 5.9 mmol/L — AB (ref 3.5–5.1)
Sodium: 133 mmol/L — ABNORMAL LOW (ref 135–145)
Total Bilirubin: 0.5 mg/dL (ref 0.3–1.2)
Total Protein: 7.5 g/dL (ref 6.5–8.1)

## 2016-02-24 LAB — CBC
HEMATOCRIT: 35.4 % — AB (ref 36.0–46.0)
Hemoglobin: 11.3 g/dL — ABNORMAL LOW (ref 12.0–15.0)
MCH: 29.9 pg (ref 26.0–34.0)
MCHC: 31.9 g/dL (ref 30.0–36.0)
MCV: 93.7 fL (ref 78.0–100.0)
PLATELETS: 92 10*3/uL — AB (ref 150–400)
RBC: 3.78 MIL/uL — ABNORMAL LOW (ref 3.87–5.11)
RDW: 13.3 % (ref 11.5–15.5)
WBC: 4.3 10*3/uL (ref 4.0–10.5)

## 2016-02-24 LAB — URINALYSIS, ROUTINE W REFLEX MICROSCOPIC
BILIRUBIN URINE: NEGATIVE
GLUCOSE, UA: 150 mg/dL — AB
KETONES UR: NEGATIVE mg/dL
Nitrite: NEGATIVE
PH: 9 — AB (ref 5.0–8.0)
Protein, ur: 100 mg/dL — AB
Specific Gravity, Urine: 1.008 (ref 1.005–1.030)

## 2016-02-24 LAB — LIPASE, BLOOD: Lipase: 51 U/L (ref 11–51)

## 2016-02-24 MED ORDER — ONDANSETRON 4 MG PO TBDP
ORAL_TABLET | ORAL | Status: AC
  Filled 2016-02-24: qty 1

## 2016-02-24 MED ORDER — DEXTROSE 5 % IV SOLN
1.0000 g | INTRAVENOUS | Status: DC
  Administered 2016-02-24: 1 g via INTRAVENOUS
  Filled 2016-02-24: qty 10

## 2016-02-24 MED ORDER — ONDANSETRON HCL 4 MG PO TABS: 4.0000 mg | ORAL_TABLET | Freq: Four times a day (QID) | ORAL | 0 refills | Status: DC

## 2016-02-24 MED ORDER — ACETAMINOPHEN 160 MG/5ML PO SOLN: 650.0000 mg | Freq: Once | ORAL | Status: DC

## 2016-02-24 MED ORDER — ONDANSETRON 4 MG PO TBDP
4.0000 mg | ORAL_TABLET | Freq: Once | ORAL | Status: AC | PRN
  Administered 2016-02-24: 4 mg via ORAL

## 2016-02-24 MED ORDER — CEPHALEXIN 500 MG PO CAPS: 500.0000 mg | ORAL_CAPSULE | Freq: Three times a day (TID) | ORAL | 0 refills | Status: DC

## 2016-02-24 NOTE — ED Triage Notes (Addendum)
Pt. Developed dizziness only when she stands up 4 days ago.  She has nausea.  She reports having hematuria yesterday.  She has polycystic  Kidney disease and has been on dialysis for 4 years.  She also having abdominal pain .   She feels her abdomen is increased in distention than usually.  Pt. Is alert and oriented X.4  Skin is warm and dry.   Pt. Also has an enlarged liver

## 2016-02-24 NOTE — ED Provider Notes (Signed)
[facility] DEPT Provider Note   CSN: [id number] Arrival date & time: 02/24/16  0102     History   Chief Complaint Chief Complaint  Patient presents with  . Hematuria  . Dizziness  . Nausea    HPI Kendra Clay is a 56 y.o. female.  HPI   56 year old female with ESRD on HD presents today with complaints of dizziness. Patient notes that for the last 3 days she has felt "off". She notes decreased appetite, still tolerating by mouth. She notes that this morning when getting out of bed she had acute onset of dizziness. She notes this made her feel off balance, lasted several minutes, and then resolved on its own with no persistent symptoms. Patient notes that this happens when she gets up quickly, does not happen when she moves slowly, also happens occasionally when she looks up too fast. She denies any history of the same. She notes last night she had an episode of hematuria, none today. Patient reports she has polycystic kidney disease, and notes a vague ache in her left flank consistent with a cyst rupture. She denies any fever or chills, reports some nausea denies any vomiting. She notes normal bowel movements. Patient is a Monday Wednesday Friday dialysis patient receiving full dialysis on Friday ( 2 days ago). Patient was instructed to come to the emergency room by her primary care provider for recheck of her hemoglobin and urinalysis. Patient denies any focal neurological deficits.  Prior to my evaluation patient received Zofran which significantly improved her nausea symptoms  Past Medical History:  Diagnosis Date  . Complication of anesthesia   . 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 Heart murmur    "slight" per ? Dr Deatra Ina 30 years ago. 2D ECHO  30 yearsa go.  Marland Kitchen History of blood transfusion    C- Section  . History of bronchitis    numerous, last time> 1 year  . Hypertension   . Night muscle spasms    legs  . Polycystic kidney disease    Genetic dx 23 years ago    . PONV (postoperative nausea and vomiting)    patch helped  . Scoliosis   . Strabismus     Patient Active Problem List   Diagnosis Date Noted  . SOB (shortness of breath) 08/14/2015  . Acute respiratory failure with hypoxia ([place]) 08/14/2015  . Normocytic normochromic anemia 08/14/2015  . Polycystic kidney disease 07/17/2013  . End stage renal disease ([place]) 08/26/2011    Past Surgical History:  Procedure Laterality Date  . ABDOMINAL HYSTERECTOMY    . AV FISTULA PLACEMENT  09/01/2011   Procedure: ARTERIOVENOUS (AV) FISTULA CREATION;  Surgeon: Rosetta Posner, MD;  Location: [facility];  Service: Vascular;  Laterality: Right;  . CESAREAN SECTION  1997  . EYE SURGERY     for lazy eye    OB History    No data available       Home Medications    Prior to Admission medications   Medication Sig Start Date End Date Taking? Authorizing Provider  aspirin EC 81 MG tablet Take 81 mg by mouth daily.    Historical Provider, MD  atorvastatin (LIPITOR) 40 MG tablet Take 40 mg by mouth daily.    Historical Provider, MD  carvedilol (COREG) 6.25 MG tablet Take 6.25 mg by mouth 2 (two) times daily with a meal.    Historical Provider, MD  cephALEXin (KEFLEX) 500 MG capsule Take 1 capsule (500 mg  total) by mouth 3 (three) times daily. 02/24/16   Okey Regal, PA-C  cyclobenzaprine (FLEXERIL) 5 MG tablet Take 5 mg by mouth 3 (three) times daily as needed for muscle spasms.     Historical Provider, MD  isosorbide mononitrate (IMDUR) 30 MG 24 hr tablet Take 30 mg by mouth daily.    Historical Provider, MD  lisinopril (PRINIVIL,ZESTRIL) 10 MG tablet Take 10 mg by mouth daily.    Historical Provider, MD  loratadine (CLARITIN) 10 MG tablet Take 10 mg by mouth daily as needed for allergies.    Historical Provider, MD  nitroGLYCERIN (NITROSTAT) 0.4 MG SL tablet Place 0.4 mg under the tongue every 5 (five) minutes as needed for chest pain.    Historical Provider, MD  ondansetron (ZOFRAN) 4 MG tablet Take 1  tablet (4 mg total) by mouth every 6 (six) hours. 02/24/16   Okey Regal, PA-C  sevelamer carbonate (RENVELA) 800 MG tablet Take 800 mg by mouth 3 (three) times daily with meals.    Historical Provider, MD  ticagrelor (BRILINTA) 90 MG TABS tablet Take 90 mg by mouth 2 (two) times daily.    Historical Provider, MD    Family History Family History  Problem Relation Age of Onset  . Arthritis Mother   . Kidney disease Father   . Polycystic kidney disease Son   . Asthma Son   . Hypertension Maternal Grandmother     Social History Social History  Substance Use Topics  . Smoking status: Former Smoker    Packs/day: 1.00    Years: 30.00    Types: Cigarettes  . Smokeless tobacco: Former Systems developer    Quit date: 01/09/2014  . Alcohol use No     Allergies   E-mycin [erythromycin] and Sulfa antibiotics   Review of Systems Review of Systems  All other systems reviewed and are negative.    Physical Exam Updated Vital Signs BP 174/86   Pulse 65   Temp 98.2 F (36.8 C) (Oral)   Resp 12   Ht 5\' 3"  (1.6 m)   Wt 55.5 kg   SpO2 99%   BMI 21.67 kg/m   Physical Exam  Constitutional: She is oriented to person, place, and time. She appears well-developed and well-nourished.  HENT:  Head: Normocephalic and atraumatic.  Eyes: Conjunctivae are normal. Pupils are equal, round, and reactive to light. Right eye exhibits no discharge. Left eye exhibits no discharge. No scleral icterus.  Neck: Normal range of motion. No JVD present. No tracheal deviation present.  Pulmonary/Chest: Effort normal. No stridor.  Neurological: She is alert and oriented to person, place, and time. Coordination normal.  Psychiatric: She has a normal mood and affect. Her behavior is normal. Judgment and thought content normal.  Nursing note and vitals reviewed.   ED Treatments / Results  Labs (all labs ordered are listed, but only abnormal results are displayed) Labs Reviewed  COMPREHENSIVE METABOLIC PANEL -  Abnormal; Notable for the following:       Result Value   Sodium 133 (*)    Potassium 5.9 (*)    Chloride 94 (*)    BUN 60 (*)    Creatinine, Ser 9.48 (*)    AST 13 (*)    ALT 10 (*)    GFR calc non Af Amer 4 (*)    GFR calc Af Amer 5 (*)    All other components within normal limits  CBC - Abnormal; Notable for the following:    RBC 3.78 (*)  Hemoglobin 11.3 (*)    HCT 35.4 (*)    Platelets 92 (*)    All other components within normal limits  URINALYSIS, ROUTINE W REFLEX MICROSCOPIC - Abnormal; Notable for the following:    APPearance HAZY (*)    pH 9.0 (*)    Glucose, UA 150 (*)    Hgb urine dipstick LARGE (*)    Protein, ur 100 (*)    Leukocytes, UA LARGE (*)    Bacteria, UA RARE (*)    Squamous Epithelial / LPF 6-30 (*)    All other components within normal limits  LIPASE, BLOOD    EKG  EKG Interpretation None       Radiology No results found.  Procedures Procedures (including critical care time)  Medications Ordered in ED Medications  ondansetron (ZOFRAN-ODT) 4 MG disintegrating tablet (not administered)  cefTRIAXone (ROCEPHIN) 1 g in dextrose 5 % 50 mL IVPB (0 g Intravenous Stopped 02/24/16 1349)  ondansetron (ZOFRAN-ODT) disintegrating tablet 4 mg (4 mg Oral Given 02/24/16 1044)     Initial Impression / Assessment and Plan / ED Course  I have reviewed the triage vital signs and the nursing notes.  Pertinent labs & imaging results that were available during my care of the patient were reviewed by me and considered in my medical decision making (see chart for details).     Labs: Urinalysis, lipase, CMP and CBC- potassium 5.9, UA leukocyte positive, too numerous RBCs and a BB sees  Imaging:  Consults:  Therapeutics: Ceftriaxone  Discharge Meds: Zofran, Keflex  Assessment/Plan:  56 year old female presents today with vague complaints of dizziness. Patient having acute episodes when going from sitting to standing, no associated symptoms  including chest pain shortness of breath, headache. The symptoms resolve on their own, consistent with vertigo. She has no signs or symptoms consistent with central cause. Patient also having but appears to be pyelonephritis with vague flank pain and UA consistent with infectious etiology. Patient very well appearing in no acute distress tolerating by mouth. She is afebrile and nontoxic. She'll be discharged home with oral antibiotics. I consulted inpatient pharmacist for dosing of Keflex, she reports 500 3 times a day based on patient's kidney function. Patient has dialysis tomorrow, she is instructed return immediately if she has any new or worsening signs or symptoms. She verbalized understanding and agreement to today's plan had no further questions or concerns at the time discharge. Patient care was discussed with Blanchie Dessert M.D. who agreed to assessment and plan.    Final Clinical Impressions(s) / ED Diagnoses   Final diagnoses:  Pyelonephritis    New Prescriptions Discharge Medication List as of 02/24/2016  1:52 PM    START taking these medications   Details  cephALEXin (KEFLEX) 500 MG capsule Take 1 capsule (500 mg total) by mouth 3 (three) times daily., Starting Sun 02/24/2016, Print         Okey Regal, PA-C 02/24/16 [place], MD 02/24/16 [id number]

## 2016-02-24 NOTE — Discharge Instructions (Signed)
Please read attached information. If you experience any new or worsening signs or symptoms please return to the emergency room for evaluation. Please follow-up with your primary care provider or specialist as discussed. Please go to dialysis tomorrow as previously scheduled. Please use medication prescribed only as directed and discontinue taking if you have any concerning signs or symptoms.

## 2016-02-24 NOTE — ED Notes (Signed)
Pt given turkey sandwich and gingerale. 

## 2017-03-01 ENCOUNTER — Emergency Department (HOSPITAL_COMMUNITY): Payer: Medicare Other

## 2017-03-01 ENCOUNTER — Other Ambulatory Visit: Payer: Self-pay

## 2017-03-01 ENCOUNTER — Emergency Department (HOSPITAL_COMMUNITY)
Admission: EM | Admit: 2017-03-01 | Discharge: 2017-03-01 | Disposition: A | Discharge status: Home / Self Care | Payer: Medicare Other | Attending: Emergency Medicine | Admitting: Emergency Medicine

## 2017-03-01 ENCOUNTER — Encounter (HOSPITAL_COMMUNITY): Payer: Self-pay

## 2017-03-01 DIAGNOSIS — R05 Cough: Secondary | ICD-10-CM | POA: Insufficient documentation

## 2017-03-01 DIAGNOSIS — Z79899 Other long term (current) drug therapy: Secondary | ICD-10-CM | POA: Insufficient documentation

## 2017-03-01 DIAGNOSIS — R0789 Other chest pain: Secondary | ICD-10-CM | POA: Insufficient documentation

## 2017-03-01 DIAGNOSIS — Z720 Tobacco use: Secondary | ICD-10-CM | POA: Diagnosis not present

## 2017-03-01 DIAGNOSIS — R059 Cough, unspecified: Secondary | ICD-10-CM

## 2017-03-01 DIAGNOSIS — Z7982 Long term (current) use of aspirin: Secondary | ICD-10-CM | POA: Diagnosis not present

## 2017-03-01 DIAGNOSIS — I10 Essential (primary) hypertension: Secondary | ICD-10-CM | POA: Diagnosis not present

## 2017-03-01 DIAGNOSIS — R0781 Pleurodynia: Secondary | ICD-10-CM

## 2017-03-01 MED ORDER — BENZONATATE 100 MG PO CAPS: 100.0000 mg | ORAL_CAPSULE | Freq: Three times a day (TID) | ORAL | 0 refills | Status: DC

## 2017-03-01 NOTE — ED Triage Notes (Addendum)
Pt states she has left side rib pain. Pt had a fall on Tuesday with knee injury. Pt also reports cough X2 weeks as well, non productive cough. Pt is MWF dialysis pt.

## 2017-03-01 NOTE — ED Provider Notes (Signed)
[facility] EMERGENCY DEPARTMENT Provider Note   CSN: [id number] Arrival date & time: 03/01/17  7829    History   Chief Complaint Chief Complaint  Patient presents with  . Cough    HPI Kendra Clay is a 57 y.o. female.  HPI   57 year old female presents today with complaints of cough and rib pain.  Patient notes that last week she suffered a fall hurting her right knee.  She notes shortly prior to falling she has had a minor dry nonproductive cough with no associated chest pain shortness of breath.  She notes this is persisted with addition of left-sided rib pain yesterday.  She denies any known injury from the fall to the ribs, reports the pain is on the left lateral lower rib with tenderness to palpation, movement.  She notes she is able to take deep breaths but has minor discomfort to the area.  She denies any abdominal pain, she denies any fever, shortness of breath.  No history DVT or PE, no lower extremity swelling or edema.  She reports she is a dialysis patient and has never missed a day at dialysis going Monday Wednesday Friday.  Patient notes using over-the-counter cough medication without symptomatic improvement.  Past Medical History:  Diagnosis Date  . Complication of anesthesia   . 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 Heart murmur    "slight" per ? Dr Deatra Ina 30 years ago. 2D ECHO  30 yearsa go.  Marland Kitchen History of blood transfusion    C- Section  . History of bronchitis    numerous, last time> 1 year  . Hypertension   . Night muscle spasms    legs  . Polycystic kidney disease    Genetic dx 23 years ago  . PONV (postoperative nausea and vomiting)    patch helped  . Scoliosis   . Strabismus     Patient Active Problem List   Diagnosis Date Noted  . SOB (shortness of breath) 08/14/2015  . Acute respiratory failure with hypoxia ([place]) 08/14/2015  . Normocytic normochromic anemia 08/14/2015  . Polycystic kidney disease 07/17/2013  . End stage  renal disease ([place]) 08/26/2011    Past Surgical History:  Procedure Laterality Date  . ABDOMINAL HYSTERECTOMY    . AV FISTULA PLACEMENT  09/01/2011   Procedure: ARTERIOVENOUS (AV) FISTULA CREATION;  Surgeon: Rosetta Posner, MD;  Location: [facility];  Service: Vascular;  Laterality: Right;  . CESAREAN SECTION  1997  . EYE SURGERY     for lazy eye    OB History    No data available       Home Medications    Prior to Admission medications   Medication Sig Start Date End Date Taking? Authorizing Provider  aspirin EC 81 MG tablet Take 81 mg by mouth daily.    [provider]  atorvastatin (LIPITOR) 40 MG tablet Take 40 mg by mouth daily.    [provider]  benzonatate (TESSALON) 100 MG capsule Take 1 capsule (100 mg total) by mouth every 8 (eight) hours. 03/01/17   Maurisha Mongeau, Dellis Filbert, PA-C  carvedilol (COREG) 6.25 MG tablet Take 6.25 mg by mouth 2 (two) times daily with a meal.    [provider]  cephALEXin (KEFLEX) 500 MG capsule Take 1 capsule (500 mg total) by mouth 3 (three) times daily. 02/24/16   Shaundrea Carrigg, Dellis Filbert, PA-C  cyclobenzaprine (FLEXERIL) 5 MG tablet Take 5 mg by mouth 3 (three) times daily as needed  for muscle spasms.     [provider]  isosorbide mononitrate (IMDUR) 30 MG 24 hr tablet Take 30 mg by mouth daily.    [provider]  lisinopril (PRINIVIL,ZESTRIL) 10 MG tablet Take 10 mg by mouth daily.    [provider]  loratadine (CLARITIN) 10 MG tablet Take 10 mg by mouth daily as needed for allergies.    [provider]  nitroGLYCERIN (NITROSTAT) 0.4 MG SL tablet Place 0.4 mg under the tongue every 5 (five) minutes as needed for chest pain.    [provider]  ondansetron (ZOFRAN) 4 MG tablet Take 1 tablet (4 mg total) by mouth every 6 (six) hours. 02/24/16   Sequoya Hogsett, Dellis Filbert, PA-C  sevelamer carbonate (RENVELA) 800 MG tablet Take 800 mg by mouth 3 (three) times daily with meals.    [provider]    ticagrelor (BRILINTA) 90 MG TABS tablet Take 90 mg by mouth 2 (two) times daily.    [provider]    Family History Family History  Problem Relation Age of Onset  . Arthritis Mother   . Kidney disease Father   . Polycystic kidney disease Son   . Asthma Son   . Hypertension Maternal Grandmother     Social History Social History   Tobacco Use  . Smoking status: Former Smoker    Packs/day: 1.00    Years: 30.00    Pack years: 30.00    Types: Cigarettes  . Smokeless tobacco: Former Systems developer    Quit date: 01/09/2014  Substance Use Topics  . Alcohol use: No    Alcohol/week: 0.0 oz  . Drug use: No     Allergies   E-mycin [erythromycin] and Sulfa antibiotics   Review of Systems Review of Systems  All other systems reviewed and are negative.    Physical Exam Updated Vital Signs BP (!) 162/93 (BP Location: Left Arm)   Pulse 69   Temp 97.8 F (36.6 C) (Oral)   Resp 14   SpO2 100%   Physical Exam  Constitutional: She is oriented to person, place, and time. She appears well-developed and well-nourished.  HENT:  Head: Normocephalic and atraumatic.  Eyes: Conjunctivae are normal. Pupils are equal, round, and reactive to light. Right eye exhibits no discharge. Left eye exhibits no discharge. No scleral icterus.  Neck: Normal range of motion. No JVD present. No tracheal deviation present.  Cardiovascular: Normal rate, regular rhythm, normal heart sounds and intact distal pulses. Exam reveals no gallop and no friction rub.  No murmur heard. Pulmonary/Chest: Effort normal and breath sounds normal. No stridor. No respiratory distress. She has no wheezes. She has no rales. She exhibits tenderness.  Exquisite tenderness to palpation of the left lower lateral ribs, no crepitus, no rash no redness, lung sounds clear throughout with no adventitious lung sounds  Musculoskeletal:  Knee immobilizer in place, no distal swelling or edema to bilateral lower extremities   Neurological: She is alert and oriented to person, place, and time. Coordination normal.  Psychiatric: She has a normal mood and affect. Her behavior is normal. Judgment and thought content normal.  Nursing note and vitals reviewed.    ED Treatments / Results  Labs (all labs ordered are listed, but only abnormal results are displayed) Labs Reviewed - No data to display  EKG  EKG Interpretation None       Radiology Dg Chest 2 View  Result Date: 03/01/2017 CLINICAL DATA:  Cough and left-sided rib pain following fall several days  ago, initial encounter EXAM: CHEST  2 VIEW COMPARISON:  08/13/2015 FINDINGS: Cardiac shadow is within normal limits. The lungs are well aerated bilaterally. No focal infiltrate or sizable effusion is seen. No acute bony abnormality is seen. IMPRESSION: No active cardiopulmonary disease. Electronically Signed   By: Inez Catalina M.D.   On: 03/01/2017 08:40    Procedures Procedures (including critical care time)  Medications Ordered in ED Medications - No data to display   Initial Impression / Assessment and Plan / ED Course  I have reviewed the triage vital signs and the nursing notes.  Pertinent labs & imaging results that were available during my care of the patient were reviewed by me and considered in my medical decision making (see chart for details).      Final Clinical Impressions(s) / ED Diagnoses   Final diagnoses:  Cough  Rib pain    Labs:   Imaging: DG chest 2 view no acute abnormalities  Consults:  Therapeutics:  Discharge Meds:   Assessment/Plan: 57 year old female presents today with complaints of rib pain.  Patient with a dry nonproductive cough over the last week, afebrile and well-appearing.  Patient has tenderness along the left lateral ribs, likely muscular in nature secondary to coughing, low suspicion from traumatic injury status post fall.  She has clear lung sounds reassuring oxygen saturation of 100% heart heart  rate of 69.  Her chest x-ray shows no acute abnormalities.  I have low suspicion for fracture or pulmonary source including pulmonary embolism, infection.  Patient will continue symptomatic care at home, she return immediately with any new or worsening signs or symptoms.  She is given strict return precautions.  She verbalized understanding and agreement to today's plan had no further questions or concerns at the time of discharge.     ED Discharge Orders        Ordered    benzonatate (TESSALON) 100 MG capsule  Every 8 hours     03/01/17 1007       HedgesDellis Filbert, PA-C 03/01/17 1015    Hayden Rasmussen, MD 03/03/17 [id number]

## 2017-03-01 NOTE — Discharge Instructions (Signed)
Please read attached information. If you experience any new or worsening signs or symptoms please return to the emergency room for evaluation. Please follow-up with your primary care provider or specialist as discussed. Please use medication prescribed only as directed and discontinue taking if you have any concerning signs or symptoms.   °

## 2017-08-16 ENCOUNTER — Encounter (HOSPITAL_COMMUNITY): Payer: Self-pay | Admitting: Emergency Medicine

## 2017-08-16 ENCOUNTER — Emergency Department (HOSPITAL_COMMUNITY)
Admission: EM | Admit: 2017-08-16 | Discharge: 2017-08-17 | Disposition: A | Discharge status: Home / Self Care | Payer: Medicare Other | Attending: Emergency Medicine | Admitting: Emergency Medicine

## 2017-08-16 ENCOUNTER — Other Ambulatory Visit: Payer: Self-pay

## 2017-08-16 ENCOUNTER — Emergency Department (HOSPITAL_COMMUNITY): Payer: Medicare Other

## 2017-08-16 DIAGNOSIS — R140 Abdominal distension (gaseous): Secondary | ICD-10-CM | POA: Diagnosis present

## 2017-08-16 DIAGNOSIS — K5900 Constipation, unspecified: Secondary | ICD-10-CM

## 2017-08-16 LAB — COMPREHENSIVE METABOLIC PANEL
ALBUMIN: 3.7 g/dL (ref 3.5–5.0)
ALT: 10 U/L (ref 0–44)
AST: 17 U/L (ref 15–41)
Alkaline Phosphatase: 130 U/L — ABNORMAL HIGH (ref 38–126)
Anion gap: 17 — ABNORMAL HIGH (ref 5–15)
BILIRUBIN TOTAL: 0.6 mg/dL (ref 0.3–1.2)
BUN: 69 mg/dL — AB (ref 6–20)
CALCIUM: 8.8 mg/dL — AB (ref 8.9–10.3)
CO2: 26 mmol/L (ref 22–32)
CREATININE: 10.17 mg/dL — AB (ref 0.44–1.00)
Chloride: 94 mmol/L — ABNORMAL LOW (ref 98–111)
GFR calc Af Amer: 4 mL/min — ABNORMAL LOW (ref >60)
GFR calc non Af Amer: 4 mL/min — ABNORMAL LOW (ref >60)
GLUCOSE: 101 mg/dL — AB (ref 70–99)
Potassium: 5 mmol/L (ref 3.5–5.1)
SODIUM: 137 mmol/L (ref 135–145)
TOTAL PROTEIN: 7.6 g/dL (ref 6.5–8.1)

## 2017-08-16 LAB — CBC
HEMATOCRIT: 32.7 % — AB (ref 36.0–46.0)
Hemoglobin: 10.2 g/dL — ABNORMAL LOW (ref 12.0–15.0)
MCH: 29.4 pg (ref 26.0–34.0)
MCHC: 31.2 g/dL (ref 30.0–36.0)
MCV: 94.2 fL (ref 78.0–100.0)
Platelets: 126 10*3/uL — ABNORMAL LOW (ref 150–400)
RBC: 3.47 MIL/uL — ABNORMAL LOW (ref 3.87–5.11)
RDW: 13.4 % (ref 11.5–15.5)
WBC: 5.4 10*3/uL (ref 4.0–10.5)

## 2017-08-16 LAB — LIPASE, BLOOD: Lipase: 72 U/L — ABNORMAL HIGH (ref 11–51)

## 2017-08-16 LAB — I-STAT BETA HCG BLOOD, ED (MC, WL, AP ONLY): I-stat hCG, quantitative: 7.9 m[IU]/mL — ABNORMAL HIGH (ref <5)

## 2017-08-16 MED ORDER — ONDANSETRON HCL 4 MG/2ML IJ SOLN: 4.0000 mg | Freq: Once | INTRAMUSCULAR | Status: DC

## 2017-08-16 MED ORDER — MORPHINE SULFATE (PF) 4 MG/ML IV SOLN: 4.0000 mg | Freq: Once | INTRAVENOUS | Status: DC

## 2017-08-16 NOTE — ED Triage Notes (Signed)
Pt presents with increased belching, abd distension, and epigastric pain since today around 3-4 pm; pt denies CP, sob; normal BM today per patient; pt HD  MWF schedule

## 2017-08-16 NOTE — ED Provider Notes (Signed)
[facility] EMERGENCY DEPARTMENT Provider Note   CSN: [id number] Arrival date & time: 08/16/17  1948     History   Chief Complaint Chief Complaint  Patient presents with  . Bloated  . GI Problem  . Belching    HPI Kendra Clay is a 57 y.o. female.  HPI Patient presents with generalized abdominal pain, abdominal distention, nausea and belching that started around 3 PM this afternoon.  States she had a normal bowel movement earlier today but not since abdominal pain has started.  States she is passing gas.  No fever or chills.  Patient is on hemodialysis Monday, Wednesday, Friday.  Last dialyzed was Friday.  States she is still does make some urine. Past Medical History:  Diagnosis Date  . Complication of anesthesia   . 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 Heart murmur    "slight" per ? Dr Deatra Ina 30 years ago. 2D ECHO  30 yearsa go.  Marland Kitchen History of blood transfusion    C- Section  . History of bronchitis    numerous, last time> 1 year  . Hypertension   . Night muscle spasms    legs  . Polycystic kidney disease    Genetic dx 23 years ago  . PONV (postoperative nausea and vomiting)    patch helped  . Scoliosis   . Strabismus     Patient Active Problem List   Diagnosis Date Noted  . SOB (shortness of breath) 08/14/2015  . Acute respiratory failure with hypoxia ([place]) 08/14/2015  . Normocytic normochromic anemia 08/14/2015  . Polycystic kidney disease 07/17/2013  . End stage renal disease ([place]) 08/26/2011    Past Surgical History:  Procedure Laterality Date  . ABDOMINAL HYSTERECTOMY    . AV FISTULA PLACEMENT  09/01/2011   Procedure: ARTERIOVENOUS (AV) FISTULA CREATION;  Surgeon: Rosetta Posner, MD;  Location: [facility];  Service: Vascular;  Laterality: Right;  . CESAREAN SECTION  1997  . EYE SURGERY     for lazy eye     OB History   None      Home Medications    Prior to Admission medications   Medication Sig Start Date End Date Taking?  Authorizing Provider  aspirin EC 81 MG tablet Take 81 mg by mouth daily.   Yes [provider]  atorvastatin (LIPITOR) 40 MG tablet Take 40 mg by mouth daily.   Yes [provider]  carvedilol (COREG) 12.5 MG tablet Take 12.5 mg by mouth 2 (two) times daily. 06/11/17  Yes [provider]  ferric citrate (AURYXIA) 1 GM 210 MG(Fe) tablet Take 210 mg by mouth 3 (three) times daily with meals.   Yes [provider]  isosorbide mononitrate (IMDUR) 30 MG 24 hr tablet Take 30 mg by mouth daily. 08/14/16  Yes [provider]  lisinopril (PRINIVIL,ZESTRIL) 2.5 MG tablet Take 2.5 mg by mouth daily. 08/04/17  Yes [provider]  loratadine (CLARITIN) 10 MG tablet Take 10 mg by mouth daily as needed for allergies.   Yes [provider]  nitroGLYCERIN (NITROSTAT) 0.4 MG SL tablet Place 0.4 mg under the tongue every 5 (five) minutes as needed for chest pain.   Yes [provider]  pantoprazole (PROTONIX) 40 MG tablet Take 40 mg by mouth daily. 08/08/17  Yes [provider]  benzonatate (TESSALON) 100 MG capsule Take 1 capsule (100 mg total) by mouth every 8 (eight) hours. Patient not taking: Reported on 08/16/2017 03/01/17  Hedges, Dellis Filbert, PA-C  cephALEXin (KEFLEX) 500 MG capsule Take 1 capsule (500 mg total) by mouth 3 (three) times daily. Patient not taking: Reported on 08/16/2017 02/24/16   Hedges, Dellis Filbert, PA-C  ondansetron (ZOFRAN) 4 MG tablet Take 1 tablet (4 mg total) by mouth every 6 (six) hours. Patient not taking: Reported on 08/16/2017 02/24/16   Hedges, Dellis Filbert, PA-C  polyethylene glycol powder (MIRALAX) powder Start taking 1 capful 3 times a day. Slowly cut back as needed until you have normal bowel movements. 08/17/17   Fatima Blank, MD    Family History Family History  Problem Relation Age of Onset  . Arthritis Mother   . Kidney disease Father   . Polycystic kidney disease Son   . Asthma Son   . Hypertension  Maternal Grandmother     Social History Social History   Tobacco Use  . Smoking status: Former Smoker    Packs/day: 1.00    Years: 30.00    Pack years: 30.00    Types: Cigarettes  . Smokeless tobacco: Former Systems developer    Quit date: 01/09/2014  Substance Use Topics  . Alcohol use: No    Alcohol/week: 0.0 oz  . Drug use: No     Allergies   E-mycin [erythromycin] and Sulfa antibiotics   Review of Systems Review of Systems  Constitutional: Negative for chills and fever.  HENT: Negative for trouble swallowing.   Eyes: Negative for visual disturbance.  Respiratory: Negative for cough and shortness of breath.   Cardiovascular: Negative for chest pain.  Gastrointestinal: Positive for abdominal distention, abdominal pain and nausea. Negative for constipation, diarrhea and vomiting.  Musculoskeletal: Negative for back pain, myalgias and neck pain.  Skin: Negative for rash and wound.  Neurological: Negative for dizziness, weakness, light-headedness, numbness and headaches.  All other systems reviewed and are negative.    Physical Exam Updated Vital Signs BP (!) 167/78   Pulse 67   Temp 98.6 F (37 C) (Oral)   Resp 18   Ht 5\' 4"  (1.626 m)   Wt 59 kg (130 lb 1.1 oz)   SpO2 98%   BMI 22.33 kg/m   Physical Exam  Constitutional: She is oriented to person, place, and time. She appears well-developed and well-nourished.  HENT:  Head: Normocephalic and atraumatic.  Mouth/Throat: Oropharynx is clear and moist.  Eyes: Pupils are equal, round, and reactive to light. EOM are normal.  Neck: Normal range of motion. Neck supple.  Cardiovascular: Normal rate and regular rhythm.  Pulmonary/Chest: Effort normal and breath sounds normal.  Abdominal: Soft. She exhibits distension. There is tenderness. There is no rebound and no guarding.  Abdomen is distended and is diffusely tender.  Hyperactive bowel sounds throughout.  Musculoskeletal: Normal range of motion. She exhibits no edema or  tenderness.  Left upper extremity AV fistula with palpable thrill.  Neurological: She is alert and oriented to person, place, and time.  Skin: Skin is warm and dry. No rash noted. No erythema.  Psychiatric: She has a normal mood and affect. Her behavior is normal.  Nursing note and vitals reviewed.    ED Treatments / Results  Labs (all labs ordered are listed, but only abnormal results are displayed) Labs Reviewed  LIPASE, BLOOD - Abnormal; Notable for the following components:      Result Value   Lipase 72 (*)    All other components within normal limits  COMPREHENSIVE METABOLIC PANEL - Abnormal; Notable for the following components:   Chloride 94 (*)  Glucose, Bld 101 (*)    BUN 69 (*)    Creatinine, Ser 10.17 (*)    Calcium 8.8 (*)    Alkaline Phosphatase 130 (*)    GFR calc non Af Amer 4 (*)    GFR calc Af Amer 4 (*)    Anion gap 17 (*)    All other components within normal limits  CBC - Abnormal; Notable for the following components:   RBC 3.47 (*)    Hemoglobin 10.2 (*)    HCT 32.7 (*)    Platelets 126 (*)    All other components within normal limits  URINALYSIS, ROUTINE W REFLEX MICROSCOPIC - Abnormal; Notable for the following components:   Color, Urine STRAW (*)    pH 9.0 (*)    Hgb urine dipstick SMALL (*)    Protein, ur 30 (*)    Leukocytes, UA TRACE (*)    All other components within normal limits  I-STAT BETA HCG BLOOD, ED ([facility], [facility], [facility] ONLY) - Abnormal; Notable for the following components:   I-stat hCG, quantitative 7.9 (*)    All other components within normal limits    EKG None  Radiology No results found.  Procedures Procedures (including critical care time)  Medications Ordered in ED Medications - No data to display   Initial Impression / Assessment and Plan / ED Course  I have reviewed the triage vital signs and the nursing notes.  Pertinent labs & imaging results that were available during my care of the patient were reviewed by me  and considered in my medical decision making (see chart for details).    Signed out to oncoming provider pending CT abdomen.  Final Clinical Impressions(s) / ED Diagnoses   Final diagnoses:  Abdominal bloating  Constipation, unspecified constipation type    ED Discharge Orders        Ordered    polyethylene glycol powder (MIRALAX) powder     08/17/17 0156       Julianne Rice, MD 08/20/17 2026

## 2017-08-17 LAB — URINALYSIS, ROUTINE W REFLEX MICROSCOPIC
Bacteria, UA: NONE SEEN
Bilirubin Urine: NEGATIVE
GLUCOSE, UA: NEGATIVE mg/dL
KETONES UR: NEGATIVE mg/dL
Nitrite: NEGATIVE
PROTEIN: 30 mg/dL — AB
Specific Gravity, Urine: 1.005 (ref 1.005–1.030)
pH: 9 — ABNORMAL HIGH (ref 5.0–8.0)

## 2017-08-17 MED ORDER — POLYETHYLENE GLYCOL 3350 17 GM/SCOOP PO POWD: ORAL | 0 refills | Status: DC

## 2017-08-17 NOTE — Discharge Instructions (Signed)

## 2017-08-17 NOTE — ED Provider Notes (Signed)
I assumed care of this patient from Dr. Lita Mains at 0000.  Please see their note for further details of Hx, PE.  Briefly patient is a 57 y.o. female who presented with abdominal discomfort.  Concern for possible small bowel obstruction.  Labs were otherwise patient's baseline and grossly reassuring.  If CT scan is negative patient is tolerating oral hydration she would be stable for discharge.  CT scan without acute intra-abdominal inflammatory/infectious process or evidence of bowel obstruction.  It did reveal likely constipation.  On reassessment patient reports that her discomfort has improved.  She is able to tolerate oral hydration.  Comfortable with being discharged home.  Disposition: Discharge  Condition: Good  I have discussed the results, Dx and Tx plan with the patient who expressed understanding and agree(s) with the plan. Discharge instructions discussed at great length. The patient was given strict return precautions who verbalized understanding of the instructions. No further questions at time of discharge.    ED Discharge Orders        Ordered    polyethylene glycol powder (MIRALAX) powder     08/17/17 0156       Follow Up: Primary care provider  Schedule an appointment as soon as possible for a visit  As needed         Cardama, Grayce Sessions, MD 08/17/17 [phone]

## 2018-05-18 ENCOUNTER — Inpatient Hospital Stay (HOSPITAL_COMMUNITY)
Admission: AD | Admit: 2018-05-18 | Discharge: 2018-05-20 | DRG: 492 | Disposition: A | Discharge status: Home Health | Payer: Medicare Other | Source: Other Acute Inpatient Hospital | Attending: Family Medicine | Admitting: Family Medicine

## 2018-05-18 ENCOUNTER — Encounter (HOSPITAL_COMMUNITY): Payer: Self-pay | Admitting: *Deleted

## 2018-05-18 ENCOUNTER — Inpatient Hospital Stay (HOSPITAL_COMMUNITY): Payer: Medicare Other

## 2018-05-18 ENCOUNTER — Other Ambulatory Visit: Payer: Self-pay

## 2018-05-18 DIAGNOSIS — Z09 Encounter for follow-up examination after completed treatment for conditions other than malignant neoplasm: Secondary | ICD-10-CM

## 2018-05-18 DIAGNOSIS — E785 Hyperlipidemia, unspecified: Secondary | ICD-10-CM | POA: Diagnosis present

## 2018-05-18 DIAGNOSIS — Z992 Dependence on renal dialysis: Secondary | ICD-10-CM | POA: Diagnosis present

## 2018-05-18 DIAGNOSIS — S82202A Unspecified fracture of shaft of left tibia, initial encounter for closed fracture: Secondary | ICD-10-CM | POA: Diagnosis not present

## 2018-05-18 DIAGNOSIS — Y92481 Parking lot as the place of occurrence of the external cause: Secondary | ICD-10-CM | POA: Diagnosis not present

## 2018-05-18 DIAGNOSIS — E8889 Other specified metabolic disorders: Secondary | ICD-10-CM | POA: Diagnosis present

## 2018-05-18 DIAGNOSIS — Z90710 Acquired absence of both cervix and uterus: Secondary | ICD-10-CM | POA: Diagnosis not present

## 2018-05-18 DIAGNOSIS — I132 Hypertensive heart and chronic kidney disease with heart failure and with stage 5 chronic kidney disease, or end stage renal disease: Secondary | ICD-10-CM | POA: Diagnosis present

## 2018-05-18 DIAGNOSIS — Z87891 Personal history of nicotine dependence: Secondary | ICD-10-CM | POA: Diagnosis not present

## 2018-05-18 DIAGNOSIS — W010XXA Fall on same level from slipping, tripping and stumbling without subsequent striking against object, initial encounter: Secondary | ICD-10-CM | POA: Diagnosis not present

## 2018-05-18 DIAGNOSIS — S82452A Displaced comminuted fracture of shaft of left fibula, initial encounter for closed fracture: Secondary | ICD-10-CM | POA: Diagnosis present

## 2018-05-18 DIAGNOSIS — S82252A Displaced comminuted fracture of shaft of left tibia, initial encounter for closed fracture: Secondary | ICD-10-CM | POA: Diagnosis present

## 2018-05-18 DIAGNOSIS — I5022 Chronic systolic (congestive) heart failure: Secondary | ICD-10-CM | POA: Diagnosis present

## 2018-05-18 DIAGNOSIS — Z79899 Other long term (current) drug therapy: Secondary | ICD-10-CM | POA: Diagnosis not present

## 2018-05-18 DIAGNOSIS — Z862 Personal history of diseases of the blood and blood-forming organs and certain disorders involving the immune mechanism: Secondary | ICD-10-CM

## 2018-05-18 DIAGNOSIS — D631 Anemia in chronic kidney disease: Secondary | ICD-10-CM | POA: Diagnosis present

## 2018-05-18 DIAGNOSIS — S82402A Unspecified fracture of shaft of left fibula, initial encounter for closed fracture: Secondary | ICD-10-CM | POA: Diagnosis not present

## 2018-05-18 DIAGNOSIS — Q613 Polycystic kidney, unspecified: Secondary | ICD-10-CM

## 2018-05-18 DIAGNOSIS — J449 Chronic obstructive pulmonary disease, unspecified: Secondary | ICD-10-CM | POA: Diagnosis present

## 2018-05-18 DIAGNOSIS — N189 Chronic kidney disease, unspecified: Secondary | ICD-10-CM

## 2018-05-18 DIAGNOSIS — Z7982 Long term (current) use of aspirin: Secondary | ICD-10-CM | POA: Diagnosis not present

## 2018-05-18 DIAGNOSIS — N186 End stage renal disease: Secondary | ICD-10-CM | POA: Diagnosis present

## 2018-05-18 DIAGNOSIS — Z419 Encounter for procedure for purposes other than remedying health state, unspecified: Secondary | ICD-10-CM

## 2018-05-18 LAB — SURGICAL PCR SCREEN
MRSA, PCR: NEGATIVE
Staphylococcus aureus: NEGATIVE

## 2018-05-18 MED ORDER — PANTOPRAZOLE SODIUM 40 MG PO TBEC
40.0000 mg | DELAYED_RELEASE_TABLET | Freq: Every day | ORAL | Status: DC
  Administered 2018-05-20: 40 mg via ORAL
  Filled 2018-05-18: qty 1

## 2018-05-18 MED ORDER — LISINOPRIL 5 MG PO TABS
2.5000 mg | ORAL_TABLET | Freq: Every day | ORAL | Status: DC
  Filled 2018-05-18 (×2): qty 1

## 2018-05-18 MED ORDER — OXYCODONE-ACETAMINOPHEN 5-325 MG PO TABS
1.0000 | ORAL_TABLET | ORAL | Status: DC | PRN
  Administered 2018-05-18 – 2018-05-19 (×3): 1 via ORAL
  Filled 2018-05-18 (×3): qty 1

## 2018-05-18 MED ORDER — ASPIRIN EC 81 MG PO TBEC
81.0000 mg | DELAYED_RELEASE_TABLET | Freq: Every day | ORAL | Status: DC
  Administered 2018-05-20: 81 mg via ORAL
  Filled 2018-05-18 (×2): qty 1

## 2018-05-18 MED ORDER — CARVEDILOL 12.5 MG PO TABS
12.5000 mg | ORAL_TABLET | Freq: Two times a day (BID) | ORAL | Status: DC
  Administered 2018-05-18 – 2018-05-20 (×4): 12.5 mg via ORAL
  Filled 2018-05-18 (×5): qty 1

## 2018-05-18 MED ORDER — ATORVASTATIN CALCIUM 40 MG PO TABS
40.0000 mg | ORAL_TABLET | Freq: Every day | ORAL | Status: DC
  Filled 2018-05-18 (×2): qty 1

## 2018-05-18 MED ORDER — ISOSORBIDE MONONITRATE ER 30 MG PO TB24
30.0000 mg | ORAL_TABLET | Freq: Every day | ORAL | Status: DC
  Administered 2018-05-20: 30 mg via ORAL
  Filled 2018-05-18 (×2): qty 1

## 2018-05-18 MED ORDER — FERRIC CITRATE 1 GM 210 MG(FE) PO TABS
210.0000 mg | ORAL_TABLET | Freq: Three times a day (TID) | ORAL | Status: DC
  Administered 2018-05-20: 210 mg via ORAL
  Filled 2018-05-18: qty 1

## 2018-05-18 MED ORDER — HEPARIN SODIUM (PORCINE) 5000 UNIT/ML IJ SOLN: 5000.0000 [IU] | Freq: Three times a day (TID) | INTRAMUSCULAR | Status: DC

## 2018-05-18 NOTE — H&P (Signed)
History and Physical  Kendra Clay TAV:[id number] DOB: [birth year] DOA: 05/18/2018  Referring physician: Transferred from [facility] PCP: Patient, [name]  Outpatient Specialists: Nephrology team Patient coming from: [facility]  Chief Complaint: Left tibia fibula fracture  HPI: Patient is a 58 year old Caucasian female with past medical history significant for end-stage renal disease secondary to polycystic kidney, on hemodialysis on Monday, Wednesday and Friday (with hemodialysis AV fistula on right upper extremity); hypertension, COPD, reformed tobacco user and congestive heart failure.  Patient reported having a mechanical fall earlier today.  According to the patient, she tripped on a parking lot curb.  Following the fall, patient was noted to have developed left tibia-fibula fracture.  Patient was transferred to [facility] for surgery as patient is on hemodialysis.  Last hemodialysis was yesterday.  Patient was fairly active prior to the fall, and denied history of recurrent falls lately.  No associated chest pain, shortness of breath or limitation of activities.  Patient was fairly active prior to the fall.  No headache, no neck pain, no chest pain, no shortness of breath, no URI symptoms, no GI symptoms.  Patient makes minimal urine.  Patient will be admitted for further assessment and management.  Orthopedic team is already aware of patient's admission.  Will consult hemodialysis team as patient's hemodialysis schedule is Monday, Wednesday and Friday.  No indication for emergent dialysis.  ED Course: Patient was transferred from [facility].  Pertinent labs: Lab work from [facility] revealed sodium of 134, potassium of 4.7, chloride of 93, CO2 of 32, BUN of 15 creatinine of 9.2 blood sugar of 93.  CBC reveals WBC of 4.2, hemoglobin of 10.8, hematocrit of 32.4, MCV of 89 with platelet count of 118.  X-ray of the left tibia-fibula, 2 view,  said to reveal moderately angulated and comminuted distal left tibial and fibular fractures.  Review of Systems:  Negative for fever, visual changes, sore throat, rash, new muscle aches, chest pain, SOB, dysuria, bleeding, n/v/abdominal pain.  Past Medical History:  Diagnosis Date  . Complication of anesthesia   . 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 Heart murmur    "slight" per ? Dr Deatra Ina 30 years ago. 2D ECHO  30 yearsa go.  Marland Kitchen History of blood transfusion    C- Section  . History of bronchitis    numerous, last time> 1 year  . Hypertension   . Night muscle spasms    legs  . Polycystic kidney disease    Genetic dx 23 years ago  . PONV (postoperative nausea and vomiting)    patch helped  . Scoliosis   . Strabismus     Past Surgical History:  Procedure Laterality Date  . ABDOMINAL HYSTERECTOMY    . AV FISTULA PLACEMENT  09/01/2011   Procedure: ARTERIOVENOUS (AV) FISTULA CREATION;  Surgeon: Rosetta Posner, MD;  Location: [place];  Service: Vascular;  Laterality: Right;  . CESAREAN SECTION  1997  . EYE SURGERY     for lazy eye     reports that she has quit smoking. Her smoking use included cigarettes. She has a 30.00 pack-year smoking history. She quit smokeless tobacco use about 4 years ago. She reports that she does not drink alcohol or use drugs.  Allergies  Allergen Reactions  . E-Mycin [Erythromycin] Hives  . Sulfa Antibiotics Rash    Family History  Problem Relation Age of Onset  . Arthritis Mother   . Kidney  disease Father   . Polycystic kidney disease Son   . Asthma Son   . Hypertension Maternal Grandmother      Prior to Admission medications   Medication Sig Start Date End Date Taking? Authorizing Provider  aspirin EC 81 MG tablet Take 81 mg by mouth daily.    [provider]  atorvastatin (LIPITOR) 40 MG tablet Take 40 mg by mouth daily.    [provider]  carvedilol (COREG) 12.5 MG tablet Take 12.5 mg by mouth 2 (two) times daily.  06/11/17   [provider]  ferric citrate (AURYXIA) 1 GM 210 MG(Fe) tablet Take 210 mg by mouth 3 (three) times daily with meals.    [provider]  isosorbide mononitrate (IMDUR) 30 MG 24 hr tablet Take 30 mg by mouth daily. 08/14/16   [provider]  lisinopril (PRINIVIL,ZESTRIL) 2.5 MG tablet Take 2.5 mg by mouth daily. 08/04/17   [provider]  loratadine (CLARITIN) 10 MG tablet Take 10 mg by mouth daily as needed for allergies.    [provider]  nitroGLYCERIN (NITROSTAT) 0.4 MG SL tablet Place 0.4 mg under the tongue every 5 (five) minutes as needed for chest pain.    [provider]  pantoprazole (PROTONIX) 40 MG tablet Take 40 mg by mouth daily. 08/08/17   [provider]  polyethylene glycol powder (MIRALAX) powder Start taking 1 capful 3 times a day. Slowly cut back as needed until you have normal bowel movements. 08/17/17   Fatima Blank, MD    Physical Exam: Vitals:   05/18/18 2115  BP: (!) 163/108  Pulse: 70  Resp: 18  Temp: 97.6 F (36.4 C)  TempSrc: Oral  SpO2: 93%  Weight: 62.3 kg     Constitutional:  . Appears calm and comfortable. Eyes:  Marland Kitchen Mild pallor. No jaundice.  ENMT:  . external ears, nose appear normal Neck:  . Neck is supple. No JVD Respiratory:  . CTA bilaterally, no w/r/r.  . Respiratory effort normal. No retractions or accessory muscle use Cardiovascular:  . S1S2 . No right LE extremity edema.  Left lower extremity is splinted and wrapped. Abdomen:  . Abdomen is soft and non tender. Organs are difficult to assess. Neurologic:  . Awake and alert. . Moves all limbs.  Wt Readings from Last 3 Encounters:  05/18/18 62.3 kg  08/16/17 59 kg  02/24/16 55.5 kg    I have personally reviewed following labs and imaging studies  Labs on Admission:  CBC: No results for input(s): WBC, NEUTROABS, HGB, HCT, MCV, PLT in the last 168 hours. Basic Metabolic Panel: No results for  input(s): NA, K, CL, CO2, GLUCOSE, BUN, CREATININE, CALCIUM, MG, PHOS in the last 168 hours. Liver Function Tests: No results for input(s): AST, ALT, ALKPHOS, BILITOT, PROT, ALBUMIN in the last 168 hours. No results for input(s): LIPASE, AMYLASE in the last 168 hours. No results for input(s): AMMONIA in the last 168 hours. Coagulation Profile: No results for input(s): INR, PROTIME in the last 168 hours. Cardiac Enzymes: No results for input(s): CKTOTAL, CKMB, CKMBINDEX, TROPONINI in the last 168 hours. BNP (last 3 results) No results for input(s): PROBNP in the last 8760 hours. HbA1C: No results for input(s): HGBA1C in the last 72 hours. CBG: No results for input(s): GLUCAP in the last 168 hours. Lipid Profile: No results for input(s): CHOL, HDL, LDLCALC, TRIG, CHOLHDL, LDLDIRECT in the last 72 hours. Thyroid Function Tests: No results for input(s): TSH, T4TOTAL, FREET4, T3FREE,  THYROIDAB in the last 72 hours. Anemia Panel: No results for input(s): VITAMINB12, FOLATE, FERRITIN, TIBC, IRON, RETICCTPCT in the last 72 hours. Urine analysis:    Component Value Date/Time   COLORURINE STRAW (A) 08/16/2017 2339   APPEARANCEUR CLEAR 08/16/2017 2339   LABSPEC 1.005 08/16/2017 2339   PHURINE 9.0 (H) 08/16/2017 2339   GLUCOSEU NEGATIVE 08/16/2017 2339   HGBUR SMALL (A) 08/16/2017 2339   BILIRUBINUR NEGATIVE 08/16/2017 2339   BILIRUBINUR neg 06/25/2011 1726   KETONESUR NEGATIVE 08/16/2017 2339   PROTEINUR 30 (A) 08/16/2017 2339   UROBILINOGEN 0.2 06/25/2011 1726   NITRITE NEGATIVE 08/16/2017 2339   LEUKOCYTESUR TRACE (A) 08/16/2017 2339   Sepsis Labs: @LABRCNTIP (procalcitonin:4,lacticidven:4) )No results found for this or any previous visit (from the past 240 hour(s)).    Radiological Exams on Admission: No results found.  Active Problems:   Tibia/fibula fracture, left, closed, initial encounter   Assessment/Plan Left tibia-fibula fracture: This is following a mechanical fall.  Pain is controlled. Patient was fairly active prior to the fall. EKG Continue to optimize pain control Orthopedic team is already aware of patient's admission.   Orthopedic surgery is planned for tomorrow Postop care as per orthopedic team. Routine lab work.  ESRD on hemodialysis on Monday Wednesday Friday: Consult nephrology team  History of hypertension: Continue current medication  History of COPD and CHF: Stable No symptoms   DVT prophylaxis: Subcu heparin Code Status: Full code Family Communication:  Disposition Plan: Will depend on hospital course Consults called: Orthopedic is aware of admission.  Will consult nephrology Admission status: Inpatient  Time spent: 65 minutes  Dana Allan, MD  [facility] Pager #: [phone] 7PM-7AM contact night coverage as above  05/18/2018, 9:48 PM

## 2018-05-19 ENCOUNTER — Encounter (HOSPITAL_COMMUNITY): Payer: Self-pay | Admitting: Anesthesiology

## 2018-05-19 ENCOUNTER — Inpatient Hospital Stay (HOSPITAL_COMMUNITY): Payer: Medicare Other | Admitting: Registered Nurse

## 2018-05-19 ENCOUNTER — Inpatient Hospital Stay (HOSPITAL_COMMUNITY): Payer: Medicare Other

## 2018-05-19 ENCOUNTER — Inpatient Hospital Stay
Admission: AD | Admit: 2018-05-19 | Payer: Self-pay | Source: Other Acute Inpatient Hospital | Admitting: Orthopaedic Surgery

## 2018-05-19 ENCOUNTER — Encounter (HOSPITAL_COMMUNITY)
Admission: AD | Disposition: A | Discharge status: Home Health | Payer: Self-pay | Source: Other Acute Inpatient Hospital | Attending: Family Medicine

## 2018-05-19 DIAGNOSIS — Z862 Personal history of diseases of the blood and blood-forming organs and certain disorders involving the immune mechanism: Secondary | ICD-10-CM

## 2018-05-19 DIAGNOSIS — N189 Chronic kidney disease, unspecified: Secondary | ICD-10-CM

## 2018-05-19 DIAGNOSIS — I5022 Chronic systolic (congestive) heart failure: Secondary | ICD-10-CM | POA: Diagnosis present

## 2018-05-19 HISTORY — PX: TIBIA IM NAIL INSERTION: SHX2516

## 2018-05-19 LAB — BASIC METABOLIC PANEL
Anion gap: 13 (ref 5–15)
Anion gap: 14 (ref 5–15)
BUN: 53 mg/dL — ABNORMAL HIGH (ref 6–20)
BUN: 56 mg/dL — ABNORMAL HIGH (ref 6–20)
CO2: 26 mmol/L (ref 22–32)
CO2: 28 mmol/L (ref 22–32)
Calcium: 9.5 mg/dL (ref 8.9–10.3)
Calcium: 9.6 mg/dL (ref 8.9–10.3)
Chloride: 94 mmol/L — ABNORMAL LOW (ref 98–111)
Chloride: 91 mmol/L — ABNORMAL LOW (ref 98–111)
Creatinine, Ser: 10.44 mg/dL — ABNORMAL HIGH (ref 0.44–1.00)
Creatinine, Ser: 10.94 mg/dL — ABNORMAL HIGH (ref 0.44–1.00)
GFR calc Af Amer: 4 mL/min — ABNORMAL LOW (ref >60)
GFR calc Af Amer: 4 mL/min — ABNORMAL LOW (ref >60)
GFR calc non Af Amer: 4 mL/min — ABNORMAL LOW (ref >60)
GFR calc non Af Amer: 3 mL/min — ABNORMAL LOW (ref >60)
Glucose, Bld: 111 mg/dL — ABNORMAL HIGH (ref 70–99)
Glucose, Bld: 98 mg/dL (ref 70–99)
Potassium: 5.3 mmol/L — ABNORMAL HIGH (ref 3.5–5.1)
Potassium: 5.2 mmol/L — ABNORMAL HIGH (ref 3.5–5.1)
Sodium: 133 mmol/L — ABNORMAL LOW (ref 135–145)
Sodium: 133 mmol/L — ABNORMAL LOW (ref 135–145)

## 2018-05-19 LAB — CBC
HCT: 30.2 % — ABNORMAL LOW (ref 36.0–46.0)
HCT: 29.4 % — ABNORMAL LOW (ref 36.0–46.0)
Hemoglobin: 10 g/dL — ABNORMAL LOW (ref 12.0–15.0)
Hemoglobin: 9.3 g/dL — ABNORMAL LOW (ref 12.0–15.0)
MCH: 30 pg (ref 26.0–34.0)
MCH: 28.9 pg (ref 26.0–34.0)
MCHC: 33.1 g/dL (ref 30.0–36.0)
MCHC: 31.6 g/dL (ref 30.0–36.0)
MCV: 90.7 fL (ref 80.0–100.0)
MCV: 91.3 fL (ref 80.0–100.0)
Platelets: 104 10*3/uL — ABNORMAL LOW (ref 150–400)
Platelets: 92 10*3/uL — ABNORMAL LOW (ref 150–400)
RBC: 3.33 MIL/uL — ABNORMAL LOW (ref 3.87–5.11)
RBC: 3.22 MIL/uL — ABNORMAL LOW (ref 3.87–5.11)
RDW: 12.5 % (ref 11.5–15.5)
RDW: 12.5 % (ref 11.5–15.5)
WBC: 5.3 10*3/uL (ref 4.0–10.5)
WBC: 5.5 10*3/uL (ref 4.0–10.5)
nRBC: 0 % (ref 0.0–0.2)
nRBC: 0 % (ref 0.0–0.2)

## 2018-05-19 LAB — CBC WITH DIFFERENTIAL/PLATELET
Abs Immature Granulocytes: 0.02 10*3/uL (ref 0.00–0.07)
Basophils Absolute: 0 10*3/uL (ref 0.0–0.1)
Basophils Relative: 1 %
Eosinophils Absolute: 0 10*3/uL (ref 0.0–0.5)
Eosinophils Relative: 1 %
HCT: 30.9 % — ABNORMAL LOW (ref 36.0–46.0)
Hemoglobin: 10 g/dL — ABNORMAL LOW (ref 12.0–15.0)
Immature Granulocytes: 0 %
Lymphocytes Relative: 22 %
Lymphs Abs: 1.3 10*3/uL (ref 0.7–4.0)
MCH: 29.2 pg (ref 26.0–34.0)
MCHC: 32.4 g/dL (ref 30.0–36.0)
MCV: 90.1 fL (ref 80.0–100.0)
Monocytes Absolute: 0.5 10*3/uL (ref 0.1–1.0)
Monocytes Relative: 8 %
Neutro Abs: 4.2 10*3/uL (ref 1.7–7.7)
Neutrophils Relative %: 68 %
Platelets: 100 10*3/uL — ABNORMAL LOW (ref 150–400)
RBC: 3.43 MIL/uL — ABNORMAL LOW (ref 3.87–5.11)
RDW: 12.4 % (ref 11.5–15.5)
WBC: 6 10*3/uL (ref 4.0–10.5)
nRBC: 0 % (ref 0.0–0.2)

## 2018-05-19 LAB — HIV ANTIBODY (ROUTINE TESTING W REFLEX): HIV Screen 4th Generation wRfx: NONREACTIVE

## 2018-05-19 LAB — PHOSPHORUS: Phosphorus: 4.9 mg/dL — ABNORMAL HIGH (ref 2.5–4.6)

## 2018-05-19 LAB — MAGNESIUM: Magnesium: 1.7 mg/dL (ref 1.7–2.4)

## 2018-05-19 SURGERY — INSERTION, INTRAMEDULLARY ROD, TIBIA
Anesthesia: General | Site: Leg Lower | Laterality: Left

## 2018-05-19 MED ORDER — CHLORHEXIDINE GLUCONATE CLOTH 2 % EX PADS
6.0000 | MEDICATED_PAD | Freq: Every day | CUTANEOUS | Status: DC
  Administered 2018-05-19: 6 via TOPICAL

## 2018-05-19 MED ORDER — ROCURONIUM BROMIDE 50 MG/5ML IV SOSY
PREFILLED_SYRINGE | INTRAVENOUS | Status: DC | PRN
  Administered 2018-05-19: 30 mg via INTRAVENOUS

## 2018-05-19 MED ORDER — CHLORHEXIDINE GLUCONATE 4 % EX LIQD
60.0000 mL | Freq: Once | CUTANEOUS | Status: DC
  Filled 2018-05-19 (×2): qty 60

## 2018-05-19 MED ORDER — VANCOMYCIN HCL 1000 MG IV SOLR
INTRAVENOUS | Status: DC | PRN
  Administered 2018-05-19: 1000 mg via TOPICAL

## 2018-05-19 MED ORDER — ONDANSETRON HCL 4 MG/2ML IJ SOLN
INTRAMUSCULAR | Status: AC
  Filled 2018-05-19: qty 2

## 2018-05-19 MED ORDER — SODIUM CHLORIDE 0.9 % IV SOLN: 100.0000 mL | INTRAVENOUS | Status: DC | PRN

## 2018-05-19 MED ORDER — LIDOCAINE 2% (20 MG/ML) 5 ML SYRINGE
INTRAMUSCULAR | Status: DC | PRN
  Administered 2018-05-19: 60 mg via INTRAVENOUS

## 2018-05-19 MED ORDER — OXYCODONE HCL 5 MG PO TABS: 5.0000 mg | ORAL_TABLET | Freq: Once | ORAL | Status: DC | PRN

## 2018-05-19 MED ORDER — OXYCODONE HCL 5 MG PO TABS
5.0000 mg | ORAL_TABLET | ORAL | Status: DC | PRN
  Administered 2018-05-20 (×2): 10 mg via ORAL
  Filled 2018-05-19 (×2): qty 2

## 2018-05-19 MED ORDER — FENTANYL CITRATE (PF) 250 MCG/5ML IJ SOLN
INTRAMUSCULAR | Status: AC
  Filled 2018-05-19: qty 5

## 2018-05-19 MED ORDER — DEXAMETHASONE SODIUM PHOSPHATE 10 MG/ML IJ SOLN
INTRAMUSCULAR | Status: DC | PRN
  Administered 2018-05-19: 10 mg via INTRAVENOUS

## 2018-05-19 MED ORDER — VANCOMYCIN HCL 1000 MG IV SOLR
INTRAVENOUS | Status: AC
  Filled 2018-05-19: qty 1000

## 2018-05-19 MED ORDER — FENTANYL CITRATE (PF) 100 MCG/2ML IJ SOLN: 25.0000 ug | INTRAMUSCULAR | Status: DC | PRN

## 2018-05-19 MED ORDER — SODIUM CHLORIDE 0.9 % IV SOLN
INTRAVENOUS | Status: DC
  Administered 2018-05-19: 13:00:00 via INTRAVENOUS

## 2018-05-19 MED ORDER — DOXERCALCIFEROL 4 MCG/2ML IV SOLN
2.0000 ug | INTRAVENOUS | Status: DC
  Filled 2018-05-19: qty 2

## 2018-05-19 MED ORDER — PROPOFOL 10 MG/ML IV BOLUS
INTRAVENOUS | Status: DC | PRN
  Administered 2018-05-19: 110 mg via INTRAVENOUS

## 2018-05-19 MED ORDER — ONDANSETRON HCL 4 MG PO TABS: 4.0000 mg | ORAL_TABLET | Freq: Four times a day (QID) | ORAL | Status: DC | PRN

## 2018-05-19 MED ORDER — LIDOCAINE HCL (PF) 1 % IJ SOLN: 5.0000 mL | INTRAMUSCULAR | Status: DC | PRN

## 2018-05-19 MED ORDER — CEFAZOLIN SODIUM-DEXTROSE 2-4 GM/100ML-% IV SOLN
2.0000 g | INTRAVENOUS | Status: AC
  Administered 2018-05-19: 2 g via INTRAVENOUS
  Filled 2018-05-19: qty 100

## 2018-05-19 MED ORDER — HYDRALAZINE HCL 20 MG/ML IJ SOLN: 10.0000 mg | INTRAMUSCULAR | Status: DC | PRN

## 2018-05-19 MED ORDER — MENTHOL 3 MG MT LOZG: 1.0000 | LOZENGE | OROMUCOSAL | Status: DC | PRN

## 2018-05-19 MED ORDER — HYDROMORPHONE HCL 1 MG/ML IJ SOLN: 0.5000 mg | INTRAMUSCULAR | Status: DC | PRN

## 2018-05-19 MED ORDER — FENTANYL CITRATE (PF) 250 MCG/5ML IJ SOLN
INTRAMUSCULAR | Status: DC | PRN
  Administered 2018-05-19 (×4): 50 ug via INTRAVENOUS

## 2018-05-19 MED ORDER — SUCCINYLCHOLINE CHLORIDE 200 MG/10ML IV SOSY
PREFILLED_SYRINGE | INTRAVENOUS | Status: AC
  Filled 2018-05-19: qty 10

## 2018-05-19 MED ORDER — ONDANSETRON HCL 4 MG/2ML IJ SOLN
4.0000 mg | Freq: Once | INTRAMUSCULAR | Status: AC | PRN
  Administered 2018-05-19: 4 mg via INTRAVENOUS

## 2018-05-19 MED ORDER — MORPHINE SULFATE (PF) 2 MG/ML IV SOLN: 2.0000 mg | INTRAVENOUS | Status: DC | PRN

## 2018-05-19 MED ORDER — METOCLOPRAMIDE HCL 5 MG/ML IJ SOLN: 5.0000 mg | Freq: Three times a day (TID) | INTRAMUSCULAR | Status: DC | PRN

## 2018-05-19 MED ORDER — LIDOCAINE-PRILOCAINE 2.5-2.5 % EX CREA: 1.0000 "application " | TOPICAL_CREAM | CUTANEOUS | Status: DC | PRN

## 2018-05-19 MED ORDER — METOCLOPRAMIDE HCL 5 MG PO TABS: 5.0000 mg | ORAL_TABLET | Freq: Three times a day (TID) | ORAL | Status: DC | PRN

## 2018-05-19 MED ORDER — SODIUM CHLORIDE 0.9 % IV SOLN
INTRAVENOUS | Status: DC | PRN
  Administered 2018-05-19: 25 ug/min via INTRAVENOUS

## 2018-05-19 MED ORDER — ROCURONIUM BROMIDE 50 MG/5ML IV SOSY
PREFILLED_SYRINGE | INTRAVENOUS | Status: AC
  Filled 2018-05-19: qty 5

## 2018-05-19 MED ORDER — PROPOFOL 10 MG/ML IV BOLUS
INTRAVENOUS | Status: AC
  Filled 2018-05-19: qty 20

## 2018-05-19 MED ORDER — PENTAFLUOROPROP-TETRAFLUOROETH EX AERO: 1.0000 "application " | INHALATION_SPRAY | CUTANEOUS | Status: DC | PRN

## 2018-05-19 MED ORDER — ONDANSETRON HCL 4 MG/2ML IJ SOLN
INTRAMUSCULAR | Status: DC | PRN
  Administered 2018-05-19: 4 mg via INTRAVENOUS

## 2018-05-19 MED ORDER — CEFAZOLIN SODIUM-DEXTROSE 2-4 GM/100ML-% IV SOLN
2.0000 g | Freq: Four times a day (QID) | INTRAVENOUS | Status: AC
  Administered 2018-05-20: 2 g via INTRAVENOUS
  Filled 2018-05-19 (×2): qty 100

## 2018-05-19 MED ORDER — DEXAMETHASONE SODIUM PHOSPHATE 10 MG/ML IJ SOLN
INTRAMUSCULAR | Status: AC
  Filled 2018-05-19: qty 1

## 2018-05-19 MED ORDER — PHENOL 1.4 % MT LIQD: 1.0000 | OROMUCOSAL | Status: DC | PRN

## 2018-05-19 MED ORDER — OXYCODONE HCL 5 MG/5ML PO SOLN: 5.0000 mg | Freq: Once | ORAL | Status: DC | PRN

## 2018-05-19 MED ORDER — ONDANSETRON HCL 4 MG/2ML IJ SOLN: 4.0000 mg | Freq: Four times a day (QID) | INTRAMUSCULAR | Status: DC | PRN

## 2018-05-19 MED ORDER — DOCUSATE SODIUM 100 MG PO CAPS
100.0000 mg | ORAL_CAPSULE | Freq: Two times a day (BID) | ORAL | Status: DC
  Administered 2018-05-20 (×2): 100 mg via ORAL
  Filled 2018-05-19 (×2): qty 1

## 2018-05-19 MED ORDER — ALBUTEROL SULFATE (2.5 MG/3ML) 0.083% IN NEBU: 2.5000 mg | INHALATION_SOLUTION | RESPIRATORY_TRACT | Status: DC | PRN

## 2018-05-19 MED ORDER — SUCCINYLCHOLINE CHLORIDE 200 MG/10ML IV SOSY
PREFILLED_SYRINGE | INTRAVENOUS | Status: DC | PRN
  Administered 2018-05-19: 100 mg via INTRAVENOUS

## 2018-05-19 MED ORDER — ACETAMINOPHEN 500 MG PO TABS
1000.0000 mg | ORAL_TABLET | Freq: Three times a day (TID) | ORAL | Status: DC
  Administered 2018-05-19 – 2018-05-20 (×4): 1000 mg via ORAL
  Filled 2018-05-19 (×4): qty 2

## 2018-05-19 MED ORDER — SUGAMMADEX SODIUM 200 MG/2ML IV SOLN
INTRAVENOUS | Status: DC | PRN
  Administered 2018-05-19: 124.6 mg via INTRAVENOUS

## 2018-05-19 MED ORDER — 0.9 % SODIUM CHLORIDE (POUR BTL) OPTIME
TOPICAL | Status: DC | PRN
  Administered 2018-05-19: 1000 mL

## 2018-05-19 SURGICAL SUPPLY — 69 items
BANDAGE ACE 6X5 VEL STRL LF (GAUZE/BANDAGES/DRESSINGS) ×3 IMPLANT
BIT DRILL CALIBRATED 4.3X320MM (BIT) ×1 IMPLANT
BIT DRILL CROWE POINT TWST 4.3 (DRILL) ×2 IMPLANT
BLADE SURG 10 STRL SS (BLADE) ×3 IMPLANT
BNDG COHESIVE 4X5 TAN STRL (GAUZE/BANDAGES/DRESSINGS) ×3 IMPLANT
BNDG GAUZE ELAST 4 BULKY (GAUZE/BANDAGES/DRESSINGS) ×3 IMPLANT
CANISTER SUCTION WELLS/JOHNSON (MISCELLANEOUS) ×3 IMPLANT
CLOSURE STERI-STRIP 1/2X4 (GAUZE/BANDAGES/DRESSINGS) ×1
CLSR STERI-STRIP ANTIMIC 1/2X4 (GAUZE/BANDAGES/DRESSINGS) ×2 IMPLANT
COVER MAYO STAND STRL (DRAPES) ×3 IMPLANT
COVER SURGICAL LIGHT HANDLE (MISCELLANEOUS) ×3 IMPLANT
COVER WAND RF STERILE (DRAPES) ×3 IMPLANT
DRAPE C-ARM 42X72 X-RAY (DRAPES) ×3 IMPLANT
DRAPE C-ARMOR (DRAPES) ×3 IMPLANT
DRAPE HALF SHEET 40X57 (DRAPES) ×2 IMPLANT
DRAPE IMP U-DRAPE 54X76 (DRAPES) ×3 IMPLANT
DRILL CALIBRATED 4.3X320MM (BIT) ×3
DRILL CROWE POINT TWIST 4.3 (DRILL) ×6
DRSG AQUACEL AG ADV 3.5X 6 (GAUZE/BANDAGES/DRESSINGS) ×3 IMPLANT
DURAPREP 26ML APPLICATOR (WOUND CARE) ×3 IMPLANT
ELECT CAUTERY BLADE 6.4 (BLADE) ×3 IMPLANT
ELECT REM PT RETURN 9FT ADLT (ELECTROSURGICAL) ×3
ELECTRODE REM PT RTRN 9FT ADLT (ELECTROSURGICAL) ×1 IMPLANT
GAUZE SPONGE 4X4 12PLY STRL (GAUZE/BANDAGES/DRESSINGS) ×3 IMPLANT
GAUZE SPONGE 4X4 12PLY STRL LF (GAUZE/BANDAGES/DRESSINGS) ×3 IMPLANT
GAUZE XEROFORM 1X8 LF (GAUZE/BANDAGES/DRESSINGS) ×3 IMPLANT
GLOVE BIO SURGEON STRL SZ7.5 (GLOVE) ×3 IMPLANT
GLOVE BIOGEL PI IND STRL 7.0 (GLOVE) ×1 IMPLANT
GLOVE BIOGEL PI IND STRL 8 (GLOVE) ×2 IMPLANT
GLOVE BIOGEL PI INDICATOR 7.0 (GLOVE) ×2
GLOVE BIOGEL PI INDICATOR 8 (GLOVE) ×4
GLOVE ECLIPSE 8.0 STRL XLNG CF (GLOVE) ×3 IMPLANT
GOWN STRL REUS W/ TWL LRG LVL3 (GOWN DISPOSABLE) ×2 IMPLANT
GOWN STRL REUS W/ TWL XL LVL3 (GOWN DISPOSABLE) ×2 IMPLANT
GOWN STRL REUS W/TWL LRG LVL3 (GOWN DISPOSABLE) ×4
GOWN STRL REUS W/TWL XL LVL3 (GOWN DISPOSABLE) ×6
GUIDEPIN 3.2X17.5 THRD DISP (PIN) ×3 IMPLANT
GUIDEWIRE 2.6X80 BEAD TIP (WIRE) ×1 IMPLANT
GUIDWIRE 2.6X80 BEAD TIP (WIRE) ×3
KIT BASIN OR (CUSTOM PROCEDURE TRAY) ×3 IMPLANT
KIT TURNOVER KIT B (KITS) ×3 IMPLANT
NAIL TIBIAL PHOENIX 9.0X300MM (Nail) ×3 IMPLANT
NS IRRIG 1000ML POUR BTL (IV SOLUTION) ×3 IMPLANT
PACK ORTHO EXTREMITY (CUSTOM PROCEDURE TRAY) ×3 IMPLANT
PACK UNIVERSAL I (CUSTOM PROCEDURE TRAY) ×3 IMPLANT
PAD ABD 7.5X8 STRL (GAUZE/BANDAGES/DRESSINGS) ×2 IMPLANT
PAD ARMBOARD 7.5X6 YLW CONV (MISCELLANEOUS) ×6 IMPLANT
PAD CAST 4YDX4 CTTN HI CHSV (CAST SUPPLIES) ×1 IMPLANT
PADDING CAST COTTON 4X4 STRL (CAST SUPPLIES) ×3
PADDING CAST COTTON 6X4 STRL (CAST SUPPLIES) ×3 IMPLANT
SCREW CORT TI DBL LEAD 5X26 (Screw) ×3 IMPLANT
SCREW CORT TI DBL LEAD 5X32 (Screw) ×6 IMPLANT
SCREW CORT TI DBL LEAD 5X36 (Screw) ×9 IMPLANT
SCREW CORT TI DBL LEAD 5X50 (Screw) ×3 IMPLANT
SPLINT PLASTER CAST XFAST 5X30 (CAST SUPPLIES) ×1 IMPLANT
SPLINT PLASTER XFAST SET 5X30 (CAST SUPPLIES) ×2
SPONGE LAP 18X18 RF (DISPOSABLE) IMPLANT
STAPLER VISISTAT 35W (STAPLE) IMPLANT
STOCKINETTE IMPERVIOUS LG (DRAPES) ×3 IMPLANT
SUT ETHILON 3 0 PS 1 (SUTURE) ×3 IMPLANT
SUT MNCRL AB 3-0 PS2 18 (SUTURE) ×3 IMPLANT
SUT VIC AB 1 CT1 27 (SUTURE) ×2
SUT VIC AB 1 CT1 27XBRD ANBCTR (SUTURE) ×1 IMPLANT
SUT VIC AB 2-0 CT1 27 (SUTURE) ×3
SUT VIC AB 2-0 CT1 TAPERPNT 27 (SUTURE) ×1 IMPLANT
TUBE CONNECTING 12'X1/4 (SUCTIONS) ×1
TUBE CONNECTING 12X1/4 (SUCTIONS) ×2 IMPLANT
WATER STERILE IRR 1000ML POUR (IV SOLUTION) ×3 IMPLANT
YANKAUER SUCT BULB TIP NO VENT (SUCTIONS) ×3 IMPLANT

## 2018-05-19 NOTE — Progress Notes (Signed)
Patient Demographics:    Kendra Clay, is a 58 y.o. female, DOB - 17-Nov-1960, QJF:[id number]  Admit date - 05/18/2018   Admitting Physician Elwyn Reach, MD  Outpatient Primary MD for the patient is Patient, [name]  LOS - 1   No chief complaint on file.       Subjective:    [name] today has no fevers, no emesis,  No chest pain, resting comfortably, no acute distress hungry  Assessment  & Plan :    Active Problems:   Tibia/fibula fracture, left, closed, initial encounter  Brief Summary:- 58 year old Caucasian female with PMHx of ESRD secondary to polycystic kidney, on hemodialysis MWF schedule (Via  AV fistula on right Arm); HTN, COPD, reformed tobacco user and HFrEF.    Admitted on 05/18/2018 with comminuted distal left tib-fib fractures after mechanical fall.  She was transferred here from [facility] as they do not have inpatient HD   A/p 1)Left tib/fib fx/Moderately Angulated and comminuted distal left tibial and fibular fractures--- orthopedic consult appreciated, patient is scheduled for IMN on 05/19/2018 by Dr. Griffin Basil  2)ESRD on HD --nephrology consult for HD requested for HD after OR on 05/19/2018  3)Anemia of ESRD----stable hemoglobin around 10, EPO agent as per nephrology team, c/n AURYXIA  4)HFrEF--- last known EF 40% from 2017, no acute CHF exacerbation at this time, may restart Coreg 12.5 mg twice daily, lisinopril 2.5 mg daily and isosorbide 30 mg daily after surgery--- it is currently n.p.o.  5)HLD--continue Lipitor and aspirin  6)COPD--- no, no flareup, may use PRN albuterol  Disposition/Need for in-Hospital Stay- patient unable to be discharged at this time due to awaiting ORIF... For left tib-fib fracture   Code Status : Full  Family Communication:   None at bedside   Disposition Plan  : TBD  Consults  :  Ortho/Nephrology  DVT Prophylaxis  :     Heparin -   Lab Results  Component Value Date   PLT 104 (L) 05/19/2018   Inpatient Medications  Scheduled Meds: . aspirin EC  81 mg Oral Daily  . atorvastatin  40 mg Oral Daily  . carvedilol  12.5 mg Oral BID  . chlorhexidine  60 mL Topical Once  . Chlorhexidine Gluconate Cloth  6 each Topical Q0600  . doxercalciferol  2 mcg Intravenous Q M,W,F-HD  . ferric citrate  210 mg Oral TID WC  . isosorbide mononitrate  30 mg Oral Daily  . lisinopril  2.5 mg Oral Daily  . pantoprazole  40 mg Oral Daily   Continuous Infusions: .  ceFAZolin (ANCEF) IV     PRN Meds:.albuterol, hydrALAZINE, morphine injection, oxyCODONE-acetaminophen    Anti-infectives (From admission, onward)   Start     Dose/Rate Route Frequency Ordered Stop   05/19/18 1045  ceFAZolin (ANCEF) IVPB 2g/100 mL premix     2 g 200 mL/hr over 30 Minutes Intravenous On call to O.R. 05/19/18 1037 05/20/18 0559        Objective:   Vitals:   05/18/18 2115 05/19/18 0200 05/19/18 0511  BP: (!) 163/108  (!) 152/86  Pulse: 70  73  Resp: 18  18  Temp: 97.6 F (36.4 C)  98.3 F (36.8 C)  TempSrc: Oral  Oral  SpO2: 93%  97%  Weight: 62.3 kg 62.3 kg   Height:  5\' 4"  (1.626 m)     Wt Readings from Last 3 Encounters:  05/19/18 62.3 kg  08/16/17 59 kg  02/24/16 55.5 kg     Intake/Output Summary (Last 24 hours) at 05/19/2018 1039 Last data filed at 05/19/2018 0954 Gross per 24 hour  Intake 120 ml  Output 0 ml  Net 120 ml     Physical Exam Patient is examined daily including today on 05/19/18 , exams remain the same as of yesterday except that has changed   Gen:- Awake Alert,  In no apparent distress  HEENT:- [place].AT, No sclera icterus Neck-Supple Neck,No JVD,.  Lungs-  CTAB , fair symmetrical air movement CV- S1, S2 normal, regular  Abd-  +ve B.Sounds, Abd Soft, No tenderness,    Extremity/Skin:- No  edema, pedal pulses present  Rt Arm AVF with positive thrill and bruit Psych-affect is appropriate, oriented x3  Neuro-no new focal deficits, no tremors MSK--left leg in brace (Tib/Fib Fx)  Data Review:   Micro Results Recent Results (from the past 240 hour(s))  Surgical pcr screen     Status: None   Collection Time: 05/18/18  9:29 PM  Result Value Ref Range Status   MRSA, PCR NEGATIVE NEGATIVE Final   Staphylococcus aureus NEGATIVE NEGATIVE Final    Comment: (NOTE) The Xpert SA Assay (FDA approved for NASAL specimens in patients 67 years of age and older), is one component of a comprehensive surveillance program. It is not intended to diagnose infection nor to guide or monitor treatment. Performed at [facility], [place] [address], [place], [place] [address]     Radiology Reports Ct Knee Left Wo Contrast  Result Date: 05/19/2018 CLINICAL DATA:  Knee pain after falling. EXAM: CT OF THE LEFT KNEE WITHOUT CONTRAST TECHNIQUE: Multidetector CT imaging of the left knee was performed according to the standard protocol. Multiplanar CT image reconstructions were also generated. COMPARISON:  Radiographs earlier the same date. FINDINGS: Bones/Joint/Cartilage The bones appear mildly demineralized. There is no evidence of proximal tibial or fibular fracture. The known distal diaphyseal fractures are not included on this CT of the knee. The distal femur and patella are intact. The joint spaces are preserved. No significant joint effusion. Ligaments Suboptimally assessed by CT. The cruciate ligaments appear grossly intact. Muscles and Tendons Unremarkable. Soft tissues There is no focal fluid collection or inflammatory changes surrounding the knee. Popliteal atherosclerosis noted. IMPRESSION: Essentially normal CT of the left knee. No evidence of proximal tibial fracture. Known distal tibial and fibular diaphyseal fractures are not included on this study. Electronically Signed   By: Richardean Sale M.D.   On: 05/19/2018 08:30    CBC Recent Labs  Lab 05/18/18 2327 05/19/18 0557  WBC 6.0 5.3  HGB 10.0*  10.0*  HCT 30.9* 30.2*  PLT 100* 104*  MCV 90.1 90.7  MCH 29.2 30.0  MCHC 32.4 33.1  RDW 12.4 12.5  LYMPHSABS 1.3  --   MONOABS 0.5  --   EOSABS 0.0  --   BASOSABS 0.0  --     Chemistries  Recent Labs  Lab 05/18/18 2327 05/19/18 0557  NA 133* 133*  K 5.3* 5.2*  CL 94* 91*  CO2 26 28  GLUCOSE 111* 98  BUN 53* 56*  CREATININE 10.44* 10.94*  CALCIUM 9.5 9.6  MG 1.7  --    Roxan Hockey M.D on 05/19/2018 at 10:39 AM  Go to [email or web address] -  for contact info  [facility] - Office  [phone]

## 2018-05-19 NOTE — Consult Note (Signed)
Reason for Consult:Left tib/fib fx Referring Physician: C Wilhelmenia Addis is an 58 y.o. female.  HPI: Kendra Clay was walking in a parking lot and didn't notice a curb which she tripped over. She's unsure exactly how she fell but had immediate left lower leg pain and noted a deformity. She went to [place] and was diagnosed with a tib/fib fx. As she is on HD she was transferred to [facility] for further care and orthopedic surgery was consulted.  Past Medical History:  Diagnosis Date  . Complication of anesthesia   . 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 Heart murmur    "slight" per ? Dr Deatra Ina 30 years ago. 2D ECHO  30 yearsa go.  Marland Kitchen History of blood transfusion    C- Section  . History of bronchitis    numerous, last time> 1 year  . Hypertension   . Night muscle spasms    legs  . Polycystic kidney disease    Genetic dx 23 years ago  . PONV (postoperative nausea and vomiting)    patch helped  . Scoliosis   . Strabismus     Past Surgical History:  Procedure Laterality Date  . ABDOMINAL HYSTERECTOMY    . AV FISTULA PLACEMENT  09/01/2011   Procedure: ARTERIOVENOUS (AV) FISTULA CREATION;  Surgeon: Rosetta Posner, MD;  Location: [place];  Service: Vascular;  Laterality: Right;  . CESAREAN SECTION  1997  . EYE SURGERY     for lazy eye    Family History  Problem Relation Age of Onset  . Arthritis Mother   . Kidney disease Father   . Polycystic kidney disease Son   . Asthma Son   . Hypertension Maternal Grandmother     Social History:  reports that she has quit smoking. Her smoking use included cigarettes. She has a 30.00 pack-year smoking history. She quit smokeless tobacco use about 4 years ago. She reports that she does not drink alcohol or use drugs.  Allergies:  Allergies  Allergen Reactions  . E-Mycin [Erythromycin] Hives  . Sulfa Antibiotics Rash    Medications: I have reviewed the patient's current medications.  Results for orders placed or performed during the  hospital encounter of 05/18/18 (from the past 48 hour(s))  Surgical pcr screen     Status: None   Collection Time: 05/18/18  9:29 PM  Result Value Ref Range   MRSA, PCR NEGATIVE NEGATIVE   Staphylococcus aureus NEGATIVE NEGATIVE    Comment: (NOTE) The Xpert SA Assay (FDA approved for NASAL specimens in patients 79 years of age and older), is one component of a comprehensive surveillance program. It is not intended to diagnose infection nor to guide or monitor treatment. Performed at [facility], [place] [address], [place], [place] [address]   Basic metabolic panel     Status: Abnormal   Collection Time: 05/18/18 11:27 PM  Result Value Ref Range   Sodium 133 (L) 135 - 145 mmol/L   Potassium 5.3 (H) 3.5 - 5.1 mmol/L   Chloride 94 (L) 98 - 111 mmol/L   CO2 26 22 - 32 mmol/L   Glucose, Bld 111 (H) 70 - 99 mg/dL   BUN 53 (H) 6 - 20 mg/dL   Creatinine, Ser 10.44 (H) 0.44 - 1.00 mg/dL   Calcium 9.5 8.9 - 10.3 mg/dL   GFR calc non Af Amer 4 (L) >60 mL/min   GFR calc Af Amer 4 (L) >60 mL/min   Anion  gap 13 5 - 15    Comment: Performed at [facility], [place] [address], [place], [place] [address]  Magnesium     Status: None   Collection Time: 05/18/18 11:27 PM  Result Value Ref Range   Magnesium 1.7 1.7 - 2.4 mg/dL    Comment: Performed at [facility], [place] [address], [place], [place] [address]  Phosphorus     Status: Abnormal   Collection Time: 05/18/18 11:27 PM  Result Value Ref Range   Phosphorus 4.9 (H) 2.5 - 4.6 mg/dL    Comment: Performed at [facility] [address] [place], [place] [address]  CBC WITH DIFFERENTIAL     Status: Abnormal   Collection Time: 05/18/18 11:27 PM  Result Value Ref Range   WBC 6.0 4.0 - 10.5 K/uL   RBC 3.43 (L) 3.87 - 5.11 MIL/uL   Hemoglobin 10.0 (L) 12.0 - 15.0 g/dL   HCT 30.9 (L) 36.0 - 46.0 %   MCV 90.1 80.0 - 100.0 fL   MCH 29.2 26.0 - 34.0 pg   MCHC 32.4 30.0 - 36.0 g/dL   RDW 12.4 11.5 - 15.5 %   Platelets 100 (L)  150 - 400 K/uL    Comment: REPEATED TO VERIFY PLATELET COUNT CONFIRMED BY SMEAR SPECIMEN CHECKED FOR CLOTS Immature Platelet Fraction may be clinically indicated, consider ordering this additional test [id number]    nRBC 0.0 0.0 - 0.2 %   Neutrophils Relative % 68 %   Neutro Abs 4.2 1.7 - 7.7 K/uL   Lymphocytes Relative 22 %   Lymphs Abs 1.3 0.7 - 4.0 K/uL   Monocytes Relative 8 %   Monocytes Absolute 0.5 0.1 - 1.0 K/uL   Eosinophils Relative 1 %   Eosinophils Absolute 0.0 0.0 - 0.5 K/uL   Basophils Relative 1 %   Basophils Absolute 0.0 0.0 - 0.1 K/uL   Immature Granulocytes 0 %   Abs Immature Granulocytes 0.02 0.00 - 0.07 K/uL    Comment: Performed at [facility], [address] [place],  [address]  Basic metabolic panel     Status: Abnormal   Collection Time: 05/19/18  5:57 AM  Result Value Ref Range   Sodium 133 (L) 135 - 145 mmol/L   Potassium 5.2 (H) 3.5 - 5.1 mmol/L   Chloride 91 (L) 98 - 111 mmol/L   CO2 28 22 - 32 mmol/L   Glucose, Bld 98 70 - 99 mg/dL   BUN 56 (H) 6 - 20 mg/dL   Creatinine, Ser 10.94 (H) 0.44 - 1.00 mg/dL   Calcium 9.6 8.9 - 10.3 mg/dL   GFR calc non Af Amer 3 (L) >60 mL/min   GFR calc Af Amer 4 (L) >60 mL/min   Anion gap 14 5 - 15    Comment: Performed at [facility] [address] [place], [place] [address]  CBC     Status: Abnormal   Collection Time: 05/19/18  5:57 AM  Result Value Ref Range   WBC 5.3 4.0 - 10.5 K/uL   RBC 3.33 (L) 3.87 - 5.11 MIL/uL   Hemoglobin 10.0 (L) 12.0 - 15.0 g/dL   HCT 30.2 (L) 36.0 - 46.0 %   MCV 90.7 80.0 - 100.0 fL   MCH 30.0 26.0 - 34.0 pg   MCHC 33.1 30.0 - 36.0 g/dL   RDW 12.5 11.5 - 15.5 %   Platelets 104 (L) 150 - 400 K/uL    Comment:  REPEATED TO VERIFY SPECIMEN CHECKED FOR CLOTS Immature Platelet Fraction may be clinically indicated, consider ordering this additional test [id number] CONSISTENT WITH PREVIOUS RESULT    nRBC 0.0 0.0 - 0.2 %    Comment: Performed at [facility], [place] [address] [place], [place] [address]    Ct Knee Left Wo Contrast  Result Date: 05/19/2018 CLINICAL DATA:  Knee pain after falling. EXAM: CT OF THE LEFT KNEE WITHOUT CONTRAST TECHNIQUE: Multidetector CT imaging of the left knee was performed according to the standard protocol. Multiplanar CT image reconstructions were also generated. COMPARISON:  Radiographs earlier the same date. FINDINGS: Bones/Joint/Cartilage The bones appear mildly demineralized. There is no evidence of proximal tibial or fibular fracture. The known distal diaphyseal fractures are not included on this CT of the knee. The distal femur and patella are intact. The joint spaces are preserved. No significant joint effusion. Ligaments Suboptimally assessed by CT. The cruciate ligaments appear grossly intact. Muscles and Tendons Unremarkable. Soft tissues There is no focal fluid collection or inflammatory changes surrounding the knee. Popliteal atherosclerosis noted. IMPRESSION: Essentially normal CT of the left knee. No evidence of proximal tibial fracture. Known distal tibial and fibular diaphyseal fractures are not included on this study. Electronically Signed   By: Richardean Sale M.D.   On: 05/19/2018 08:30    Review of Systems  Constitutional: Negative for weight loss.  HENT: Negative for ear discharge, ear pain, hearing loss and tinnitus.   Eyes: Negative for blurred vision, double vision, photophobia and pain.  Respiratory: Negative for cough, sputum production and shortness of breath.   Cardiovascular: Negative for chest pain.  Gastrointestinal: Negative for abdominal pain, nausea and vomiting.  Genitourinary: Negative for dysuria, flank pain, frequency and urgency.  Musculoskeletal: Positive for joint pain (Left lower leg). Negative for back pain, falls, myalgias and neck pain.  Neurological: Negative for dizziness, tingling, sensory change, focal weakness, loss of consciousness and headaches.   Endo/Heme/Allergies: Does not bruise/bleed easily.  Psychiatric/Behavioral: Negative for depression, memory loss and substance abuse. The patient is not nervous/anxious.    Blood pressure (!) 152/86, pulse 73, temperature 98.3 F (36.8 C), temperature source Oral, resp. rate 18, height 5\' 4"  (1.626 m), weight 62.3 kg, SpO2 97 %. Physical Exam  Constitutional: She appears well-developed and well-nourished. No distress.  HENT:  Head: Normocephalic and atraumatic.  Eyes: Conjunctivae are normal. Right eye exhibits no discharge. Left eye exhibits no discharge. No scleral icterus.  Neck: Normal range of motion.  Cardiovascular: Normal rate and regular rhythm.  Respiratory: Effort normal. No respiratory distress.  Musculoskeletal:     Comments: LLE No traumatic wounds, ecchymosis, or rash  Splint in place  No knee effusion  Knee stable to varus/ valgus and anterior/posterior stress  Sens DPN, SPN intact,TN paresthetic  Motor EHL 5/5  No significant edema  Neurological: She is alert.  Skin: Skin is warm and dry. She is not diaphoretic.  Psychiatric: She has a normal mood and affect. Her behavior is normal.    Assessment/Plan: Left tib/fib fx -- For IMN this afternoon by Dr. Griffin Basil. Please keep NPO until then. ESRD on HD  HTN COPD CHF    Lisette Abu, PA-C Orthopedic Surgery [phone] 05/19/2018, 9:05 AM

## 2018-05-19 NOTE — Anesthesia Procedure Notes (Signed)
Procedure Name: Intubation Date/Time: 05/19/2018 1:35 PM Performed by: Renato Shin, CRNA Pre-anesthesia Checklist: Patient identified, Emergency Drugs available, Suction available and Patient being monitored Patient Re-evaluated:Patient Re-evaluated prior to induction Oxygen Delivery Method: Circle system utilized Preoxygenation: Pre-oxygenation with 100% oxygen Induction Type: IV induction and Rapid sequence Laryngoscope Size: Miller and 3 Grade View: Grade I Tube type: Oral Tube size: 7.0 mm Number of attempts: 1 Airway Equipment and Method: Stylet and Oral airway Placement Confirmation: ETT inserted through vocal cords under direct vision,  positive ETCO2 and breath sounds checked- equal and bilateral Secured at: 21 cm Tube secured with: Tape Dental Injury: Teeth and Oropharynx as per pre-operative assessment

## 2018-05-19 NOTE — Op Note (Signed)
Orthopaedic Surgery Operative Note (CSN: [id number])  Kendra Clay  May 24, 1960 Date of Surgery: 05/18/2018 - 05/19/2018   Diagnoses:  Left tibial shaft fracture  Procedure: 27759 tiba shaft intramedullary nail placement   Operative Finding Successful completion of planned procedure.  Exceptionally poor bone quality in this patient on dialysis demonstrated by being able to push the guidewire into her tibia by hand with minimal effort.  Patient required 3 proximal and distal interlock screws, 2 of the proximal screws were able to be locked into the nail.  Poor purchase with all screws secondary to bone quality.  Splint placed.  Implants: Biomet Phoenix nail system, 9 mm x 300 mm nail  Post-operative plan: The patient will be nonweightbearing for 6 to 8 weeks.  The patient will be readmitted to medicine service for dialysis.  DVT prophylaxis per medical team in the setting of dialysis.  Pain control with PRN pain medication preferring oral medicines.  Follow up plan will be scheduled in approximately 14 days for incision check and XR.  Post-Op Diagnosis: Same Surgeons:Primary: Hiram Gash, MD Assistants: April Green, RNFA Location: [facility] OR ROOM 17 Anesthesia: General Antibiotics: Ancef 2g preop, Vancomycin 1000mg  locally Tourniquet time: * No tourniquets in log * Estimated Blood Loss: 939 Complications: None Specimens: None Implants: Implant Name Type Inv. Item Serial No. Manufacturer Lot No. LRB No. Used Action  NAIL TIBIAL PHOENIX 9.0X300MM - [id number] Nail NAIL TIBIAL PHOENIX 9.0X300MM  ZIMMER RECON(ORTH,TRAU,BIO,SG) [id number] Left 1 Implanted  SCREW CORT TI DBL LEAD 5X36 - [id number] Screw SCREW CORT TI DBL LEAD 5X36  ZIMMER RECON(ORTH,TRAU,BIO,SG) [id number] Left 1 Implanted  SCREW CORT TI DBL LEAD 5X32 - [id number] Screw SCREW CORT TI DBL LEAD 5X32  ZIMMER RECON(ORTH,TRAU,BIO,SG) [id number] Left 1 Implanted  SCREW CORT TI DBL LEAD 5X36 - [id number] Screw SCREW CORT TI DBL LEAD 5X36  ZIMMER  RECON(ORTH,TRAU,BIO,SG) [id number] Left 1 Implanted  SCREW CORT TI DBL LEAD 5X50 - [id number] Screw SCREW CORT TI DBL LEAD 5X50  ZIMMER RECON(ORTH,TRAU,BIO,SG) [id number] Left 1 Implanted  SCREW CORT TI DBL LEAD 5X36 - [id number] Screw SCREW CORT TI DBL LEAD 5X36  ZIMMER RECON(ORTH,TRAU,BIO,SG) [id number] Left 1 Implanted  SCREW CORT TI DBL LEAD 5X32 - [id number] Screw SCREW CORT TI DBL LEAD 5X32  ZIMMER RECON(ORTH,TRAU,BIO,SG) [id number] Left 1 Implanted    Indications for Surgery:   Kendra Clay is a 58 y.o. female with low energy fall resulting in a displaced tibial shaft and fibula fracture.  Patient was seen at an outside hospital but due to her dialysis needs was unable to have surgery there.  Benefits and risks of operative and nonoperative management were discussed prior to surgery with patient/guardian(s) and informed consent form was completed.  Specific risks including infection, need for additional surgery, nonunion, malunion, periprosthetic fracture, hardware failure compartment syndrome.   Procedure:   The patient was identified in the preoperative holding area where the surgical site was marked. The patient was taken to the OR where a procedural timeout was called and the above noted anesthesia was induced.  The patient was positioned supine on a radiolucent table.  Preoperative antibiotics were dosed.  The patient's left tibia was prepped and draped in the usual sterile fashion.  A second preoperative timeout was called.      We began with a lateral parapatellar approach to the proximal tibia.  Incision was made midline overlying the distal one half of the patella as well as the proximal aspect of the patellar tendon.  We dissected down  to layer 1 sharply and then raised thick skin flaps.  We are then able to move the tissue laterally and expose the lateral aspect of the patella and the patellar tendon.  Retinaculum was incised sharply taking care to avoid involvement of the capsule thus we did not  become intra-articular.  We then were able to sharply release the lateral retinaculum for later closure to allow the patella to translate medially for the semi-extended nailing position.   Once this was performed we then began with our approach to the proximal must with a tibia and placed a guidewire on orthogonal views at the medial aspect of the lateral tibial eminence.  This was placed just off the anterior lip of the tibia as is typical for starting point for tibial nail.  This was then advanced on orthogonal views and an entry reamer was used to open the tibial canal.   Once this was performed were able to advance a ball-tipped guidewire down the length of the tibia and held the fracture reduced while the wire was passed across the fracture site itself.  This was passed to the level of the distal physeal scar.  This point we obtained a measurement using fluoroscopy to guide her management.  We measured for a 30cm nail.  We then began with reaming.  Sequentially reamed up from an 8 mm size to a 10.91mm size containing chatter with our largest reamers.  We selected a 74mm nail based on this.  Fracture was held reduced throughout the reaming process.   This point a Biomet Phoenix 22mm x 30 cm nail was placed under orthogonal fluoroscopic images to the appropriate position.  Were happy with our length rotation and alignment and placed 2 distal interlock screws which had extremely poor purchase secondary to bone quality and added an additional anterior posterior interlock screw.  We gently performed a back slap technique to reapproximate the fracture itself and get bony apposition.  We then placed 2 proximal interlock screws that were able to be locked into the nail as well as 1 additional transverse interlock screw.  The oblique screws proximally may cause patient hardware related pain however we felt that the ability to lock the screws was vital and the poor bone quality in this patient.   Final assessment of  rotation and alignment was appropriate and we are satisfied with final fluoroscopic images.  The incision was thoroughly irrigated and closed in a multilayer fashion with absorbable sutures. A sterile dressing was placed.  Well-padded short leg splint was placed.  The patient was awoken from general anesthesia and taken to the PACU in stable condition without complication.

## 2018-05-19 NOTE — Anesthesia Preprocedure Evaluation (Addendum)
Anesthesia Evaluation  Patient identified by MRN, date of birth, ID band Patient awake    Reviewed: Allergy & Precautions, NPO status , Patient's Chart, lab work & pertinent test results, reviewed documented beta blocker date and time   History of Anesthesia Complications (+) PONV  Airway Mallampati: II  TM Distance: >3 FB     Dental  (+) Dental Advisory Given   Pulmonary former smoker,    breath sounds clear to auscultation       Cardiovascular hypertension, Pt. on medications and Pt. on home beta blockers +CHF   Rhythm:Regular Rate:Normal     Neuro/Psych negative neurological ROS     GI/Hepatic negative GI ROS, Neg liver ROS,   Endo/Other  negative endocrine ROS  Renal/GU ESRF and DialysisRenal disease     Musculoskeletal   Abdominal   Peds  Hematology  (+) anemia ,   Anesthesia Other Findings   Reproductive/Obstetrics                            Lab Results  Component Value Date   WBC 5.3 05/19/2018   HGB 10.0 (L) 05/19/2018   HCT 30.2 (L) 05/19/2018   MCV 90.7 05/19/2018   PLT 104 (L) 05/19/2018   Lab Results  Component Value Date   CREATININE 10.94 (H) 05/19/2018   BUN 56 (H) 05/19/2018   NA 133 (L) 05/19/2018   K 5.2 (H) 05/19/2018   CL 91 (L) 05/19/2018   CO2 28 05/19/2018    Anesthesia Physical Anesthesia Plan  ASA: III  Anesthesia Plan: General   Post-op Pain Management:    Induction: Intravenous  PONV Risk Score and Plan: 4 or greater and Ondansetron, Dexamethasone, Scopolamine patch - Pre-op, Midazolam and Treatment may vary due to age or medical condition  Airway Management Planned: Oral ETT  Additional Equipment:   Intra-op Plan:   Post-operative Plan: Extubation in OR  Informed Consent: I have reviewed the patients History and Physical, chart, labs and discussed the procedure including the risks, benefits and alternatives for the proposed  anesthesia with the patient or authorized representative who has indicated his/her understanding and acceptance.       Plan Discussed with: Anesthesiologist and CRNA  Anesthesia Plan Comments:        Anesthesia Quick Evaluation

## 2018-05-19 NOTE — Anesthesia Postprocedure Evaluation (Signed)
Anesthesia Post Note  Patient: Kendra Clay  Procedure(s) Performed: INTRAMEDULLARY (IM) NAIL TIBIAL (Left Leg Lower)     Patient location during evaluation: PACU Anesthesia Type: General Level of consciousness: awake and alert Pain management: pain level controlled Vital Signs Assessment: post-procedure vital signs reviewed and stable Respiratory status: spontaneous breathing, nonlabored ventilation, respiratory function stable and patient connected to nasal cannula oxygen Cardiovascular status: blood pressure returned to baseline and stable Postop Assessment: no apparent nausea or vomiting Anesthetic complications: no    Last Vitals:  Vitals:   05/19/18 1555 05/19/18 1610  BP: (!) 162/77 (!) 154/70  Pulse: 65 68  Resp: 15 18  Temp:    SpO2: 100% 99%    Last Pain:  Vitals:   05/19/18 1522  TempSrc:   PainSc: 0-No pain                 Tiajuana Amass

## 2018-05-19 NOTE — Consult Note (Addendum)
[facility] Renal Consultation Note    Indication for Consultation:  Management of ESRD/hemodialysis; anemia, hypertension/volume and secondary hyperparathyroidism PCP: No PCP on file.   HPI: Kendra Clay is a 58 y.o. female with ESRD on hemodialysis MWF at [facility]. PMH: ADPKD, polycystic live,HTN, AOCD, SHPT. She attends HD regularly, last HD 05/17/2018. She is an excellent patient, compliant with HD prescription.   Patient was in usual state of health when she tripped at [facility] and sustained mechanical fall in parking lot. She was transported via EMS to [facility] D/T tib/fib fracture. Splint was applied at [facility] and she was transported to [facility] for orthopedic consult/managment of fracture. Ortho has been consulted. She will go to OR today for IMN LLE.  Currently she is NPO awaiting surgery. C/O pain 5/10 but appears calm and comfortable. Has cast in place LLE with mild swelling L toes. Good capillary refill, toes warm. No other issues prior to admit. Denies fever, chills, cough, SOB.   Past Medical History:  Diagnosis Date  . Complication of anesthesia   . 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 Heart murmur    "slight" per ? Dr Deatra Ina 30 years ago. 2D ECHO  30 yearsa go.  Marland Kitchen History of blood transfusion    C- Section  . History of bronchitis    numerous, last time> 1 year  . Hypertension   . Night muscle spasms    legs  . Polycystic kidney disease    Genetic dx 23 years ago  . PONV (postoperative nausea and vomiting)    patch helped  . Scoliosis   . Strabismus    Past Surgical History:  Procedure Laterality Date  . ABDOMINAL HYSTERECTOMY    . AV FISTULA PLACEMENT  09/01/2011   Procedure: ARTERIOVENOUS (AV) FISTULA CREATION;  Surgeon: Rosetta Posner, MD;  Location: [facility];  Service: Vascular;  Laterality: Right;  . CESAREAN SECTION  1997  . EYE SURGERY     for lazy eye   Family History  Problem Relation Age of Onset  .  Arthritis Mother   . Kidney disease Father   . Polycystic kidney disease Son   . Asthma Son   . Hypertension Maternal Grandmother    Social History:  reports that she has quit smoking. Her smoking use included cigarettes. She has a 30.00 pack-year smoking history. She quit smokeless tobacco use about 4 years ago. She reports that she does not drink alcohol or use drugs. Allergies  Allergen Reactions  . E-Mycin [Erythromycin] Hives  . Sulfa Antibiotics Rash   Prior to Admission medications   Medication Sig Start Date End Date Taking? Authorizing Provider  aspirin EC 81 MG tablet Take 81 mg by mouth daily.    [provider]  atorvastatin (LIPITOR) 40 MG tablet Take 40 mg by mouth daily.    [provider]  carvedilol (COREG) 12.5 MG tablet Take 12.5 mg by mouth 2 (two) times daily. 06/11/17   [provider]  ferric citrate (AURYXIA) 1 GM 210 MG(Fe) tablet Take 210 mg by mouth 3 (three) times daily with meals.    [provider]  isosorbide mononitrate (IMDUR) 30 MG 24 hr tablet Take 30 mg by mouth daily. 08/14/16   [provider]  lisinopril (PRINIVIL,ZESTRIL) 2.5 MG tablet Take 2.5 mg by mouth daily. 08/04/17   [provider]  loratadine (CLARITIN) 10 MG tablet Take 10 mg by mouth daily as needed for  allergies.    [provider]  nitroGLYCERIN (NITROSTAT) 0.4 MG SL tablet Place 0.4 mg under the tongue every 5 (five) minutes as needed for chest pain.    [provider]  pantoprazole (PROTONIX) 40 MG tablet Take 40 mg by mouth daily. 08/08/17   [provider]  polyethylene glycol powder (MIRALAX) powder Start taking 1 capful 3 times a day. Slowly cut back as needed until you have normal bowel movements. 08/17/17   Fatima Blank, MD   Current Facility-Administered Medications  Medication Dose Route Frequency Provider Last Rate Last Dose  . albuterol (PROVENTIL) (2.5 MG/3ML) 0.083% nebulizer solution 2.5 mg   2.5 mg Nebulization Q2H PRN Emokpae, Courage, MD      . aspirin EC tablet 81 mg  81 mg Oral Daily Dana Allan I, MD      . atorvastatin (LIPITOR) tablet 40 mg  40 mg Oral Daily Dana Allan I, MD      . carvedilol (COREG) tablet 12.5 mg  12.5 mg Oral BID Dana Allan I, MD   12.5 mg at 05/18/18 2212  . ceFAZolin (ANCEF) IVPB 2g/100 mL premix  2 g Intravenous On Call to OR Ophelia Charter T, MD      . chlorhexidine (HIBICLENS) 4 % liquid 4 application  60 mL Topical Once Hiram Gash, MD      . Chlorhexidine Gluconate Cloth 2 % PADS 6 each  6 each Topical Q0600 Valentina Gu, NP   6 each at 05/19/18 1032  . doxercalciferol (HECTOROL) injection 2 mcg  2 mcg Intravenous Q M,W,F-HD Valentina Gu, NP      . ferric citrate (AURYXIA) tablet 210 mg  210 mg Oral TID WC Dana Allan I, MD      . hydrALAZINE (APRESOLINE) injection 10 mg  10 mg Intravenous Q4H PRN Emokpae, Courage, MD      . isosorbide mononitrate (IMDUR) 24 hr tablet 30 mg  30 mg Oral Daily Dana Allan I, MD      . lisinopril (PRINIVIL,ZESTRIL) tablet 2.5 mg  2.5 mg Oral Daily Dana Allan I, MD      . morphine 2 MG/ML injection 2 mg  2 mg Intravenous Q3H PRN Emokpae, Courage, MD      . oxyCODONE-acetaminophen (PERCOCET/ROXICET) 5-325 MG per tablet 1 tablet  1 tablet Oral Q4H PRN Bonnell Public, MD   1 tablet at 05/19/18 0759  . pantoprazole (PROTONIX) EC tablet 40 mg  40 mg Oral Daily Dana Allan I, MD       Labs: Basic Metabolic Panel: Recent Labs  Lab 05/18/18 2327 05/19/18 0557  NA 133* 133*  K 5.3* 5.2*  CL 94* 91*  CO2 26 28  GLUCOSE 111* 98  BUN 53* 56*  CREATININE 10.44* 10.94*  CALCIUM 9.5 9.6  PHOS 4.9*  --    Liver Function Tests: No results for input(s): AST, ALT, ALKPHOS, BILITOT, PROT, ALBUMIN in the last 168 hours. No results for input(s): LIPASE, AMYLASE in the last 168 hours. No results for input(s): AMMONIA in the last 168 hours. CBC: Recent Labs  Lab  05/18/18 2327 05/19/18 0557  WBC 6.0 5.3  NEUTROABS 4.2  --   HGB 10.0* 10.0*  HCT 30.9* 30.2*  MCV 90.1 90.7  PLT 100* 104*   Cardiac Enzymes: No results for input(s): CKTOTAL, CKMB, CKMBINDEX, TROPONINI in the last 168 hours. CBG: No results for input(s): GLUCAP in the last 168 hours. Iron Studies: No results for input(s): IRON, TIBC,  TRANSFERRIN, FERRITIN in the last 72 hours. Studies/Results: Ct Knee Left Wo Contrast  Result Date: 05/19/2018 CLINICAL DATA:  Knee pain after falling. EXAM: CT OF THE LEFT KNEE WITHOUT CONTRAST TECHNIQUE: Multidetector CT imaging of the left knee was performed according to the standard protocol. Multiplanar CT image reconstructions were also generated. COMPARISON:  Radiographs earlier the same date. FINDINGS: Bones/Joint/Cartilage The bones appear mildly demineralized. There is no evidence of proximal tibial or fibular fracture. The known distal diaphyseal fractures are not included on this CT of the knee. The distal femur and patella are intact. The joint spaces are preserved. No significant joint effusion. Ligaments Suboptimally assessed by CT. The cruciate ligaments appear grossly intact. Muscles and Tendons Unremarkable. Soft tissues There is no focal fluid collection or inflammatory changes surrounding the knee. Popliteal atherosclerosis noted. IMPRESSION: Essentially normal CT of the left knee. No evidence of proximal tibial fracture. Known distal tibial and fibular diaphyseal fractures are not included on this study. Electronically Signed   By: Richardean Sale M.D.   On: 05/19/2018 08:30    ROS: As per HPI otherwise negative.   Physical Exam: Vitals:   05/18/18 2115 05/19/18 0200 05/19/18 0511 05/19/18 1100  BP: (!) 163/108  (!) 152/86 139/78  Pulse: 70  73 70  Resp: 18  18   Temp: 97.6 F (36.4 C)  98.3 F (36.8 C) 98.1 F (36.7 C)  TempSrc: Oral  Oral Oral  SpO2: 93%  97%   Weight: 62.3 kg 62.3 kg    Height:  5\' 4"  (1.626 m)        General: Pleasant older female in NAD. Head: Normocephalic, atraumatic, sclera non-icteric, mucus membranes are moist Neck: Supple. JVD not elevated. Lungs: Clear bilaterally to auscultation without wheezes, rales, or rhonchi. Breathing is unlabored. Heart: RRR with S1 S2 2/6 systolic M.  Abdomen: Soft, non-tender, non-distended with normoactive bowel sounds. No rebound/guarding. No obvious abdominal masses. M-S:  Strength and tone appear normal for age. Lower extremities: Cast LLE. No RLE edema. Neuro: Alert and oriented X 3. Moves all extremities spontaneously. Psych:  Responds to questions appropriately with a normal affect. Dialysis Access: RFA AVF + bruit  Shafer MWF 3 hr 15 min 160NRe 350/Autoflow 1.5 60.5 Kg 2.0 K/2.25 Ca  -Heparin 2500 units IV initial bolus, Heparin 1000 units IV mid run TIW -Hectorol 2 mcg IV TIW (last PTH 718 04/28/18 -Parsabiv 2.5 mg IV TIW  -Mircera 30 mcg IV q 4 weeks (last dose 04/21/18 Last HGB 11.2 05/12/18) BMD meds:  Auryxia binders 210 mg 2 tabs PO TID AC (Last phos 5.4 04/28/18)   Assessment/Plan: 1.  Mechanical Fall/L tib/fib fx-to OR today for IMN per Dr.Varkey. Per primary 2.  ESRD -  MWF. HD today on schedule. No heparin. K+ 5.2.  3.  Hypertension/volume  - BP controlled. No evidence of volume overload. UFG 2-2.5 liters.  4.  Anemia  - HGB 11.2. ESA due next week. None needed now. Follow HGB.  5.  Metabolic bone disease - Ca 9.5 Phos 4.9. Continue binders, VDRA. Parsabiv not available on hospital formulary.  6.  Nutrition -NPO at present. Renal diet, renal vit and nepro when eating  Rita H. Owens Shark, NP-C 05/19/2018, 11:18 AM  [employer] [phone]  Pt seen, examined and agree w A/P as above.  [facility] 05/19/2018, 1:01 PM

## 2018-05-19 NOTE — Transfer of Care (Signed)
Immediate Anesthesia Transfer of Care Note  Patient: Kendra Clay  Procedure(s) Performed: INTRAMEDULLARY (IM) NAIL TIBIAL (Left Leg Lower)  Patient Location: PACU  Anesthesia Type:General  Level of Consciousness: awake, oriented and patient cooperative  Airway & Oxygen Therapy: Patient Spontanous Breathing and Patient connected to face mask oxygen  Post-op Assessment: Report given to RN and Post -op Vital signs reviewed and stable  Post vital signs: Reviewed and stable  Last Vitals:  Vitals Value Taken Time  BP 137/85 05/19/2018  3:22 PM  Temp    Pulse 67 05/19/2018  3:24 PM  Resp 22 05/19/2018  3:24 PM  SpO2 100 % 05/19/2018  3:24 PM  Vitals shown include unvalidated device data.  Last Pain:  Vitals:   05/19/18 1100  TempSrc: Oral  PainSc:       Patients Stated Pain Goal: 0 (47/09/29 5747)  Complications: No apparent anesthesia complications

## 2018-05-20 ENCOUNTER — Encounter (HOSPITAL_COMMUNITY): Payer: Self-pay | Admitting: Orthopaedic Surgery

## 2018-05-20 LAB — CBC
HCT: 26.8 % — ABNORMAL LOW (ref 36.0–46.0)
Hemoglobin: 8.5 g/dL — ABNORMAL LOW (ref 12.0–15.0)
MCH: 29.1 pg (ref 26.0–34.0)
MCHC: 31.7 g/dL (ref 30.0–36.0)
MCV: 91.8 fL (ref 80.0–100.0)
Platelets: 90 10*3/uL — ABNORMAL LOW (ref 150–400)
RBC: 2.92 MIL/uL — ABNORMAL LOW (ref 3.87–5.11)
RDW: 12.6 % (ref 11.5–15.5)
WBC: 4.9 10*3/uL (ref 4.0–10.5)
nRBC: 0 % (ref 0.0–0.2)

## 2018-05-20 LAB — BASIC METABOLIC PANEL
Anion gap: 13 (ref 5–15)
BUN: 24 mg/dL — ABNORMAL HIGH (ref 6–20)
CO2: 28 mmol/L (ref 22–32)
Calcium: 9.1 mg/dL (ref 8.9–10.3)
Chloride: 93 mmol/L — ABNORMAL LOW (ref 98–111)
Creatinine, Ser: 6.14 mg/dL — ABNORMAL HIGH (ref 0.44–1.00)
GFR calc Af Amer: 8 mL/min — ABNORMAL LOW (ref >60)
GFR calc non Af Amer: 7 mL/min — ABNORMAL LOW (ref >60)
Glucose, Bld: 130 mg/dL — ABNORMAL HIGH (ref 70–99)
Potassium: 4.4 mmol/L (ref 3.5–5.1)
Sodium: 134 mmol/L — ABNORMAL LOW (ref 135–145)

## 2018-05-20 MED ORDER — ASPIRIN EC 81 MG PO TBEC: 81.0000 mg | DELAYED_RELEASE_TABLET | Freq: Every day | ORAL | 2 refills | Status: DC

## 2018-05-20 MED ORDER — HEPARIN SODIUM (PORCINE) 5000 UNIT/ML IJ SOLN
5000.0000 [IU] | Freq: Two times a day (BID) | INTRAMUSCULAR | Status: DC
  Administered 2018-05-20: 5000 [IU] via SUBCUTANEOUS
  Filled 2018-05-20: qty 1

## 2018-05-20 MED ORDER — HEPARIN SODIUM (PORCINE) 5000 UNIT/ML IJ SOLN: 5000.0000 [IU] | Freq: Two times a day (BID) | INTRAMUSCULAR | 0 refills | Status: DC

## 2018-05-20 MED ORDER — ENOXAPARIN SODIUM 30 MG/0.3ML ~~LOC~~ SOLN: 30.0000 mg | SUBCUTANEOUS | 0 refills | Status: DC

## 2018-05-20 MED ORDER — ATORVASTATIN CALCIUM 40 MG PO TABS: 40.0000 mg | ORAL_TABLET | Freq: Every day | ORAL | Status: DC

## 2018-05-20 MED ORDER — LOSARTAN POTASSIUM 25 MG PO TABS: 25.0000 mg | ORAL_TABLET | Freq: Every day | ORAL | Status: DC

## 2018-05-20 MED ORDER — ACETAMINOPHEN 500 MG PO TABS: 1000.0000 mg | ORAL_TABLET | Freq: Three times a day (TID) | ORAL | 0 refills | Status: DC

## 2018-05-20 MED ORDER — OXYCODONE HCL 5 MG PO TABS: 5.0000 mg | ORAL_TABLET | ORAL | 0 refills | Status: DC | PRN

## 2018-05-20 MED FILL — ENOXAPARIN 30 MG/0.3 ML SYR: 30 | 2 days supply | Qty: 1 | Fill #0

## 2018-05-20 NOTE — Progress Notes (Signed)
Physical Therapy Treatment Patient Details Name: Kendra Clay MRN: [id number] DOB: 11-27-1960 Today's Date: 05/20/2018    History of Present Illness 58 y.o. female admitted wtih L tibia/fibula fracture, s/p IM nail, NWB. PMH of ESRD on HD, COPD.     PT Comments    Issued handout for stair training, verbally explained 2 techniques (one sitting, one standing). Pt chose to sit down and scoot up stairs on her buttocks, with assist from her son. She declined practicing stairs. From PT standpoint, she is ready to DC home.    Follow Up Recommendations  Home health PT;Supervision for mobility/OOB     Equipment Recommendations  Rolling walker with 5" wheels;3in1 (PT)    Recommendations for Other Services       Precautions / Restrictions Precautions Precautions: Fall Precaution Comments: pt denies other falls in past 1 year Restrictions Weight Bearing Restrictions: Yes LLE Weight Bearing: Non weight bearing          Cognition Arousal/Alertness: Awake/alert Behavior During Therapy: WFL for tasks assessed/performed Overall Cognitive Status: Within Functional Limits for tasks assessed                                        Mobility  Stairs verbally instructed pt in scooting up stairs in seated on ground position, issued handout for standing technique with RW, she declined practicing this, stated she sit and "bump up on my butt" with assist from sonNo   General Comments        Pertinent Vitals/Pain Pain Assessment: 0-10 Pain Score: 5  Pain Location: LLE with activity Pain Descriptors / Indicators: Throbbing Pain Intervention(s): Limited activity within patient's tolerance;Monitored during session;Premedicated before session;Repositioned    Home Living Family/patient expects to be discharged to:: Private residence Living Arrangements: Children Available Help at Discharge: Family;Available 24 hours/day Type of Home: House Home Access: Stairs to  enter Entrance Stairs-Rails: None Home Layout: One level Home Equipment: Crutches      Prior Function Level of Independence: Independent          PT Goals (current goals can now be found in the care plan section) Acute Rehab PT Goals Patient Stated Goal: return to independence PT Goal Formulation: With patient Time For Goal Achievement: 06/03/18 Potential to Achieve Goals: Good    Frequency    Min 5X/week      PT Plan      Co-evaluation              AM-PAC PT "6 Clicks" Mobility   Outcome Measure  Help needed turning from your back to your side while in a flat bed without using bedrails?: A Little Help needed moving from lying on your back to sitting on the side of a flat bed without using bedrails?: A Little Help needed moving to and from a bed to a chair (including a wheelchair)?: A Little Help needed standing up from a chair using your arms (e.g., wheelchair or bedside chair)?: A Little Help needed to walk in hospital room?: A Little Help needed climbing 3-5 steps with a railing? : A Little 6 Click Score: 18    End of Session Equipment Utilized During Treatment: Gait belt Activity Tolerance: Patient tolerated treatment well;No increased pain Patient left: in chair;with call bell/phone within reach;with chair alarm set Nurse Communication: Mobility status PT Visit Diagnosis: Pain;History of falling (Z91.81);Difficulty in walking, not elsewhere classified (R26.2);Muscle weakness (generalized) (M62.81)  Pain - Right/Left: Left Pain - part of body: Leg     Time: 1368-5992 PT Time Calculation (min) (ACUTE ONLY): 6 min  Charges: no charge                       Philomena Doheny PT 05/20/2018  Acute Rehabilitation Services Pager [phone] Office [phone]    Philomena Doheny

## 2018-05-20 NOTE — Evaluation (Signed)
Physical Therapy Evaluation Patient Details Name: Kendra Clay MRN: [id number] DOB: 05-16-1960 Today's Date: 05/20/2018   History of Present Illness  58 y.o. female admitted wtih L tibia/fibula fracture, s/p IM nail, NWB. PMH of ESRD on HD, COPD.   Clinical Impression  Pt admitted with above diagnosis. Pt currently with functional limitations due to the deficits listed below (see PT Problem List). Pt ambulated 75' with RW, NWB LLE, with supervision, distance limited by fatigue. She would benefit from [place], [place], 3 in 1. She stated her son lives with her and can provide 38* assistance.  Pt will benefit from skilled PT to increase their independence and safety with mobility to allow discharge to the venue listed below.       Follow Up Recommendations Home health PT;Supervision for mobility/OOB    Equipment Recommendations  Rolling walker with 5" wheels;3in1 (PT)    Recommendations for Other Services       Precautions / Restrictions Precautions Precautions: Fall Precaution Comments: pt denies other falls in past 1 year Restrictions Weight Bearing Restrictions: Yes LLE Weight Bearing: Non weight bearing      Mobility  Bed Mobility Overal bed mobility: Modified Independent             General bed mobility comments: HOB up, advance LLE to edge of bed using LUE  Transfers Overall transfer level: Needs assistance Equipment used: Rolling walker (2 wheeled) Transfers: Sit to/from Stand Sit to Stand: Min guard         General transfer comment: VCs hand placement  Ambulation/Gait Ambulation/Gait assistance: Min guard Gait Distance (Feet): 45 Feet Assistive device: Rolling walker (2 wheeled) Gait Pattern/deviations: Step-to pattern;Decreased stride length Gait velocity: decr   General Gait Details: good adherence to NWB status LLE, distance limited by fatigue, no loss of balance  Stairs            Wheelchair Mobility    Modified Rankin (Stroke Patients  Only)       Balance Overall balance assessment: Needs assistance   Sitting balance-Leahy Scale: Good     Standing balance support: Bilateral upper extremity supported Standing balance-Leahy Scale: Poor Standing balance comment: relies on BUE support of RW 2* NWB status LLE                             Pertinent Vitals/Pain Pain Assessment: 0-10 Pain Score: 5  Pain Location: LLE with activity Pain Descriptors / Indicators: Throbbing Pain Intervention(s): Limited activity within patient's tolerance;Monitored during session;Premedicated before session;Repositioned    Home Living Family/patient expects to be discharged to:: Private residence Living Arrangements: Children Available Help at Discharge: Family;Available 24 hours/day Type of Home: House Home Access: Stairs to enter Entrance Stairs-Rails: None Entrance Stairs-Number of Steps: 3 Home Layout: One level Home Equipment: Crutches      Prior Function Level of Independence: Independent               Hand Dominance        Extremity/Trunk Assessment   Upper Extremity Assessment Upper Extremity Assessment: Overall WFL for tasks assessed    Lower Extremity Assessment Lower Extremity Assessment: LLE deficits/detail LLE Deficits / Details: SLR +2/5, lower leg in splint, can wiggle toes LLE Sensation: WNL LLE Coordination: WNL    Cervical / Trunk Assessment Cervical / Trunk Assessment: Normal  Communication   Communication: No difficulties  Cognition Arousal/Alertness: Awake/alert Behavior During Therapy: WFL for tasks assessed/performed Overall Cognitive Status: Within Functional Limits  for tasks assessed                                        General Comments      Exercises     Assessment/Plan    PT Assessment Patient needs continued PT services  PT Problem List Decreased activity tolerance;Decreased strength;Decreased balance;Decreased mobility;Pain;Decreased  knowledge of use of DME       PT Treatment Interventions Gait training;DME instruction;Stair training;Therapeutic exercise;Therapeutic activities;Functional mobility training;Patient/family education    PT Goals (Current goals can be found in the Care Plan section)  Acute Rehab PT Goals Patient Stated Goal: return to independence PT Goal Formulation: With patient Time For Goal Achievement: 06/03/18 Potential to Achieve Goals: Good    Frequency Min 5X/week   Barriers to discharge        Co-evaluation               AM-PAC PT "6 Clicks" Mobility  Outcome Measure Help needed turning from your back to your side while in a flat bed without using bedrails?: A Little Help needed moving from lying on your back to sitting on the side of a flat bed without using bedrails?: A Little Help needed moving to and from a bed to a chair (including a wheelchair)?: A Little Help needed standing up from a chair using your arms (e.g., wheelchair or bedside chair)?: A Little Help needed to walk in hospital room?: A Little Help needed climbing 3-5 steps with a railing? : A Little 6 Click Score: 18    End of Session Equipment Utilized During Treatment: Gait belt Activity Tolerance: Patient tolerated treatment well;No increased pain Patient left: in chair;with call bell/phone within reach;with chair alarm set Nurse Communication: Mobility status PT Visit Diagnosis: Pain;History of falling (Z91.81);Difficulty in walking, not elsewhere classified (R26.2);Muscle weakness (generalized) (M62.81) Pain - Right/Left: Left Pain - part of body: Leg    Time: 6629-4765 PT Time Calculation (min) (ACUTE ONLY): 21 min   Charges:   PT Evaluation $PT Eval Low Complexity: 1 Low        Blondell Reveal Kistler PT 05/20/2018  Acute Rehabilitation Services Pager [phone] Office [phone]

## 2018-05-20 NOTE — Discharge Instructions (Signed)
1) activity limitations as advised by orthopedic surgeon 2) follow-up with your orthopedic surgeon as advised 3) continue hemodialysis on Mondays Wednesdays and Fridays per usual schedule 4) physical therapist and occupational therapist to come to your house to help you with your rehabilitation due to Lt Tib/Fib Fracture   Orthopedic postoperative discharge instructions Weightbearing: TDWB LLE (touchdown weightbearing on left lower extremity) Insicional and dressing care: Dressings left intact until follow-up Showering: Keep dressing and splint dry. VTE prophylaxis: Lovenox 30 mg SQ Daily for 6 weeks for DVT Prophylaxis Pain control: Minimize narcotics. Follow - up plan: 1 week, okay for discharge from my perspective when cleared by physical therapy. Contact information:  Weekdays 8-5 Ophelia Charter MD [phone], After hours and holidays please check [email or web address] for group call information for [facility]  Orthopedic postoperative discharge instructions Weightbearing: TDWB LLE (touchdown weightbearing on left lower extremity) Insicional and dressing care: Dressings left intact until follow-up Showering: Keep dressing and splint dry. VTE prophylaxis: Heparin 5000 units twice daily per nephrology team. 6 weeks. Pain control: Minimize narcotics. Follow - up plan: 1 week, okay for discharge from my perspective when cleared by physical therapy. Contact information:  Weekdays 8-5 Ophelia Charter MD [phone], After hours and holidays please check [email or web address] for group call information for [facility]

## 2018-05-20 NOTE — Plan of Care (Signed)
Pt D/C

## 2018-05-20 NOTE — Progress Notes (Addendum)
DISCHARGE NOTE HOME Kendra Clay to be discharged Home per MD order. Discussed prescriptions and follow up appointments with the patient. Prescriptions given to patient; medication list explained in detail. Patient verbalized understanding.  Skin clean, dry and intact without evidence of skin break down, no evidence of skin tears noted. IV catheter discontinued intact. Site without signs and symptoms of complications. Dressing and pressure applied. Pt denies pain at the site currently. No complaints noted.  Patient free of lines, drains, and wounds. Pt educated on how to self administer Lovenox injection.   An After Visit Summary (AVS) was printed and given to the patient. Patient escorted via wheelchair, and discharged home via private auto.  Paulla Fore, RN

## 2018-05-20 NOTE — Discharge Summary (Signed)
Kendra Clay, is a 58 y.o. female  DOB [birth year]  MRN [id number].  Admission date:  05/18/2018  Admitting Physician  Elwyn Reach, MD  Discharge Date:  05/20/2018   Primary MD  Patient, [name]  Recommendations for primary care physician for things to follow:   1) activity limitations as advised by orthopedic surgeon 2) follow-up with your orthopedic surgeon as advised 3) continue hemodialysis on Mondays Wednesdays and Fridays per usual schedule 4) physical therapist and occupational therapist to come to your house to help you with your rehabilitation due to Lt Tib/Fib Fracture   Orthopedic postoperative discharge instructions Weightbearing: TDWB LLE (touchdown weightbearing on left lower extremity) Insicional and dressing care: Dressings left intact until follow-up Showering: Keep dressing and splint dry. VTE prophylaxis: Lovenox 30 mg SQ Daily for 6 weeks for DVT Prophylaxis Pain control: Minimize narcotics. Follow - up plan: 1 week, okay for discharge from my perspective when cleared by physical therapy. Contact information:  Weekdays 8-5 Ophelia Charter MD [phone], After hours and holidays please check [email or web address] for group call information for [facility]  Orthopedic postoperative discharge instructions Weightbearing: TDWB LLE (touchdown weightbearing on left lower extremity) Insicional and dressing care: Dressings left intact until follow-up Showering: Keep dressing and splint dry. VTE prophylaxis: Heparin 5000 units twice daily per nephrology team. 6 weeks. Pain control: Minimize narcotics. Follow - up plan: 1 week, okay for discharge from my perspective when cleared by physical therapy. Contact information:  Weekdays 8-5 Ophelia Charter MD [phone], After hours and holidays please check [email or web address] for group call information for [facility]  Admission Diagnosis  TIB FIB  FRACTURE   Discharge Diagnosis  TIB FIB FRACTURE   Principal Problem:   Tibia/fibula fracture, left, closed, initial encounter Active Problems:   End stage renal disease (HCC)   Polycystic kidney disease   History of anemia due to CKD   Chronic HFrEF (heart failure with reduced ejection fraction) (HCC)      Past Medical History:  Diagnosis Date   Complication of anesthesia    Enlarged liver    secondary to PKD   Heart murmur    "slight" per ? Dr Deatra Ina 30 years ago. 2D ECHO  30 yearsa go.   History of blood transfusion    C- Section   History of bronchitis    numerous, last time> 1 year   Hypertension    Night muscle spasms    legs   Polycystic kidney disease    Genetic dx 23 years ago   PONV (postoperative nausea and vomiting)    patch helped   Scoliosis    Strabismus     Past Surgical History:  Procedure Laterality Date   ABDOMINAL HYSTERECTOMY     AV FISTULA PLACEMENT  09/01/2011   Procedure: ARTERIOVENOUS (AV) FISTULA CREATION;  Surgeon: Rosetta Posner, MD;  Location: [facility];  Service: Vascular;  Laterality: Right;   [place]     for lazy eye  TIBIA IM NAIL INSERTION Left 05/19/2018   Procedure: INTRAMEDULLARY (IM) NAIL TIBIAL;  Surgeon: Hiram Gash, MD;  Location: [facility];  Service: Orthopedics;  Laterality: Left;       HPI  from the history and physical done on the day of admission:    Chief Complaint: Left tibia fibula fracture  HPI: Patient is a 58 year old Caucasian female with past medical history significant for end-stage renal disease secondary to polycystic kidney, on hemodialysis on Monday, Wednesday and Friday (with hemodialysis AV fistula on right upper extremity); hypertension, COPD, reformed tobacco user and congestive heart failure.  Patient reported having a mechanical fall earlier today.  According to the patient, she tripped on a parking lot curb.  Following the fall, patient was noted to have  developed left tibia-fibula fracture.  Patient was transferred to [facility] for surgery as patient is on hemodialysis.  Last hemodialysis was yesterday.  Patient was fairly active prior to the fall, and denied history of recurrent falls lately.  No associated chest pain, shortness of breath or limitation of activities.  Patient was fairly active prior to the fall.  No headache, no neck pain, no chest pain, no shortness of breath, no URI symptoms, no GI symptoms.  Patient makes minimal urine.  Patient will be admitted for further assessment and management.  Orthopedic team is already aware of patient's admission.  Will consult hemodialysis team as patient's hemodialysis schedule is Monday, Wednesday and Friday.  No indication for emergent dialysis.  ED Course: Patient was transferred from [facility]  Pertinent labs: Lab work from [facility] revealed sodium of 134, potassium of 4.7, chloride of 93, CO2 of 32, BUN of 15 creatinine of 9.2 blood sugar of 93.  CBC reveals WBC of 4.2, hemoglobin of 10.8, hematocrit of 32.4, MCV of 89 with platelet count of 118.  X-ray of the left tibia-fibula, 2 view, said to reveal moderately angulated and comminuted distal left tibial and fibular fractures.     Hospital Course:     Brief Summary:- 58 year old Caucasian female with PMHx of ESRD secondary to polycystic kidney, on hemodialysis MWF schedule (Via  AV fistula on right Arm); HTN, COPD, reformed tobacco user and HFrEF.   Admitted on 05/18/2018 with comminuted distal left tib-fib fractures after mechanical fall.  She was transferred here from [facility] as they do not have inpatient HD   A/p 1)Left Tib/Fib Fx/Moderately Angulated and comminuted distal left tibial and fibular Fractures--- orthopedic consult appreciated, patient is s/p IMN on 05/19/2018 by Dr. Griffin Basil, discussed with pharmacist okay to useLovenox 30 mg SQ Daily for 6 weeks for DVT Prophylaxis  2)ESRD on  HD --nephrology consult appreciated, last HD after OR on 05/19/2018  3)Anemia of ESRD----stable hemoglobin around 10, EPO agent as per nephrology team, c/n AURYXIA  4)HFrEF--- last known EF 40% from 2017, no acute CHF exacerbation at this time, c/n Coreg 12.5 mg twice daily, lisinopril 2.5 mg daily and isosorbide 30 mg daily   5)HLD--continue Lipitor and aspirin  6)COPD--- no, no flareup, may use PRN albuterol   Code Status : Full  Family Communication:   None at bedside   Disposition Plan  :  Home with home health services  Consults  :  Ortho/Nephrology  Discharge Condition: stable   Discharge Instructions:- Discharge home on heparin 5000 units twice daily for 6 weeks as per orthopedic Surgeon   1) activity limitations as advised by orthopedic surgeon 2) follow-up with your orthopedic surgeon as advised  3) continue hemodialysis on Mondays Wednesdays and Fridays per usual schedule 4) physical therapist and occupational therapist to come to your house to help you with your rehabilitation due to Lt Tib/Fib Fracture  Follow UP  [place] Follow up.   Specialty:  Home Health Services Why:  for home health services. They will call you in 1-2 days to set up [place]. Contact information: [place] [address] [phone]        [place] Follow up.   Why:  RW and 3/1  to be delivered to patient room prior to DC           Consults obtained - ortho  Diet and Activity recommendation:  As advised  Discharge Instructions    Discharge Instructions    Call MD for:  difficulty breathing, headache or visual disturbances   Complete by:  As directed    Call MD for:  persistant dizziness or light-headedness   Complete by:  As directed    Call MD for:  persistant nausea and vomiting   Complete by:  As directed    Call MD for:  severe uncontrolled pain   Complete by:  As directed    Call MD for:   temperature >100.4   Complete by:  As directed    Diet - low sodium heart healthy   Complete by:  As directed    Discharge instructions   Complete by:  As directed    1) activity limitations as advised by orthopedic surgeon 2) follow-up with your orthopedic surgeon as advised 3) continue hemodialysis on Mondays Wednesdays and Fridays per usual schedule 4) physical therapist and occupational therapist to come to your house to help you with your rehabilitation due to Lt Tib/Fib Fracture  Orthopedic postoperative discharge instructions Weightbearing: TDWB LLE (touchdown weightbearing on left lower extremity) Insicional and dressing care: Dressings left intact until follow-up Showering: Keep dressing and splint dry. VTE prophylaxis: Lovenox 30 mg SQ Daily for 6 weeks for DVT Prophylaxis Pain control: Minimize narcotics. Follow - up plan: 1 week, okay for discharge from my perspective when cleared by physical therapy. Contact information:  Weekdays 8-5 Ophelia Charter MD [phone], After hours and holidays please check [email or web address] for group call information for [facility]   Increase activity slowly   Complete by:  As directed         Discharge Medications     Allergies as of 05/20/2018      Reactions   E-mycin [erythromycin] Hives   Sulfa Antibiotics Rash   Lisinopril Cough      Medication List    TAKE these medications   acetaminophen 500 MG tablet Commonly known as:  TYLENOL Take 2 tablets (1,000 mg total) by mouth every 8 (eight) hours.   aspirin EC 81 MG tablet Take 1 tablet (81 mg total) by mouth daily with breakfast. What changed:  when to take this   atorvastatin 40 MG tablet Commonly known as:  LIPITOR Take 40 mg by mouth daily.   carvedilol 12.5 MG tablet Commonly known as:  COREG Take 12.5 mg by mouth 2 (two) times daily.   enoxaparin 30 MG/0.3ML injection Commonly known as:  Lovenox Inject 0.3 mLs (30 mg total) into the skin daily. For 6 weeks for DVT  Prophylaxis   ferric citrate 1 GM 210 MG(Fe) tablet Commonly known as:  AURYXIA Take 210 mg by mouth 3 (three) times daily with meals.  isosorbide mononitrate 30 MG 24 hr tablet Commonly known as:  IMDUR Take 30 mg by mouth daily.   loratadine 10 MG tablet Commonly known as:  CLARITIN Take 10 mg by mouth daily as needed for allergies.   losartan 25 MG tablet Commonly known as:  COZAAR Take 25 mg by mouth daily.   nitroGLYCERIN 0.4 MG SL tablet Commonly known as:  NITROSTAT Place 0.4 mg under the tongue every 5 (five) minutes as needed for chest pain.   oxyCODONE 5 MG immediate release tablet Commonly known as:  Oxy IR/ROXICODONE Take 1 tablet (5 mg total) by mouth every 4 (four) hours as needed for moderate pain or severe pain.   pantoprazole 20 MG tablet Commonly known as:  PROTONIX Take 20 mg by mouth daily.   polyethylene glycol powder 17 GM/SCOOP powder Commonly known as:  MiraLax Start taking 1 capful 3 times a day. Slowly cut back as needed until you have normal bowel movements. What changed:    how much to take  how to take this  when to take this  reasons to take this  additional instructions            Durable Medical Equipment  (From admission, onward)         Start     Ordered   05/20/18 1338  For home use only DME Walker rolling  (Walkers)  Once    Comments:  Lt Tib/Fib Fracture  Question:  Patient needs a walker to treat with the following condition  Answer:  Tibia/fibula fracture   05/20/18 1340   05/20/18 1323  For home use only DME 3 n 1  Once     05/20/18 1322   05/20/18 1321  For home use only DME Walker rolling  Once    Question:  Patient needs a walker to treat with the following condition  Answer:  Weakness   05/20/18 1322          Major procedures and Radiology Reports - PLEASE review detailed and final reports for all details, in brief -   Dg Tibia/fibula Left  Result Date: 05/19/2018 CLINICAL DATA:  Tibial fracture  repair FLUOROSCOPY TIME:  2 minutes and 37 seconds. Images: 8 EXAM: LEFT TIBIA AND FIBULA - 2 VIEW COMPARISON:  None. FINDINGS: By the end of the study, an intramedullary rod crosses the mid tibial fracture with proximal and distal interlocking screws. A mid fibular fractures again noted. IMPRESSION: Tibial fracture repair as above.  Mid fibular fracture. Electronically Signed   By: Dorise Bullion III M.D   On: 05/19/2018 16:36   Ct Knee Left Wo Contrast  Result Date: 05/19/2018 CLINICAL DATA:  Knee pain after falling. EXAM: CT OF THE LEFT KNEE WITHOUT CONTRAST TECHNIQUE: Multidetector CT imaging of the left knee was performed according to the standard protocol. Multiplanar CT image reconstructions were also generated. COMPARISON:  Radiographs earlier the same date. FINDINGS: Bones/Joint/Cartilage The bones appear mildly demineralized. There is no evidence of proximal tibial or fibular fracture. The known distal diaphyseal fractures are not included on this CT of the knee. The distal femur and patella are intact. The joint spaces are preserved. No significant joint effusion. Ligaments Suboptimally assessed by CT. The cruciate ligaments appear grossly intact. Muscles and Tendons Unremarkable. Soft tissues There is no focal fluid collection or inflammatory changes surrounding the knee. Popliteal atherosclerosis noted. IMPRESSION: Essentially normal CT of the left knee. No evidence of proximal tibial fracture. Known distal tibial and fibular diaphyseal fractures  are not included on this study. Electronically Signed   By: Richardean Sale M.D.   On: 05/19/2018 08:30   Dg Tibia/fibula Left Port  Result Date: 05/19/2018 CLINICAL DATA:  Postop left tibial repair EXAM: PORTABLE LEFT TIBIA AND FIBULA - 2 VIEW COMPARISON:  None. FINDINGS: Antegrade intramedullary nail of the left tibia traversing a mid shaft fracture in near anatomic alignment. Mild medial displacement of midshaft fibular fracture. Expected  postoperative gas. IMPRESSION: Near anatomic alignment of tibial fracture status post ORIF. Electronically Signed   By: Ulyses Jarred M.D.   On: 05/19/2018 18:28   Dg C-arm 1-60 Min  Result Date: 05/19/2018 CLINICAL DATA:  Left tibial nail. FLUOROSCOPY TIME:  2 minutes and 37 seconds. Images: 8 EXAM: DG C-ARM 61-120 MIN COMPARISON:  None. FINDINGS: The comminuted displaced mid fibular fracture is again identified. An intramedullary rod now crosses the mid tibial fracture. Proximal and distal interlocking screws are noted. IMPRESSION: Intramedullary rod now crosses the mid tibial fracture with proximal and distal interlocking screws. A mid fibular fracture is again noted. Electronically Signed   By: Dorise Bullion III M.D   On: 05/19/2018 16:35    Micro Results   Recent Results (from the past 240 hour(s))  Surgical pcr screen     Status: None   Collection Time: 05/18/18  9:29 PM  Result Value Ref Range Status   MRSA, PCR NEGATIVE NEGATIVE Final   Staphylococcus aureus NEGATIVE NEGATIVE Final    Comment: (NOTE) The Xpert SA Assay (FDA approved for NASAL specimens in patients 54 years of age and older), is one component of a comprehensive surveillance program. It is not intended to diagnose infection nor to guide or monitor treatment. Performed at [facility], [place] [address] [place], [place] [address]        Today   Subjective    Arpi Diebold today has no new complaints, no cp, no f/c...          Patient has been seen and examined prior to discharge   Objective   Blood pressure 128/78, pulse 84, temperature 98.6 F (37 C), temperature source Oral, resp. rate 18, height 5\' 4"  (1.626 m), weight 67.1 kg, SpO2 95 %.   Intake/Output Summary (Last 24 hours) at 05/20/2018 1639 Last data filed at 05/20/2018 1100 Gross per 24 hour  Intake 310 ml  Output 600 ml  Net -290 ml    Exam Gen:- Awake Alert,  In no apparent distress  HEENT:- [place].AT, No sclera  icterus Neck-Supple Neck,No JVD,.  Lungs-  CTAB , fair symmetrical air movement CV- S1, S2 normal, regular  Abd-  +ve B.Sounds, Abd Soft, No tenderness,    Extremity/Skin:- No  edema, pedal pulses present  Rt Arm AVF with positive thrill and bruit Psych-affect is appropriate, oriented x3 Neuro-no new focal deficits, no tremors MSK--left leg in brace (s/p IMN for Lt Tib/Fib Fx)   Data Review   CBC w Diff:  Lab Results  Component Value Date   WBC 4.9 05/20/2018   HGB 8.5 (L) 05/20/2018   HCT 26.8 (L) 05/20/2018   PLT 90 (L) 05/20/2018   LYMPHOPCT 22 05/18/2018   MONOPCT 8 05/18/2018   EOSPCT 1 05/18/2018   BASOPCT 1 05/18/2018    CMP:  Lab Results  Component Value Date   NA 134 (L) 05/20/2018   K 4.4 05/20/2018   CL 93 (L) 05/20/2018   CO2 28 05/20/2018   BUN 24 (H) 05/20/2018   CREATININE 6.14 (  H) 05/20/2018   CREATININE 3.30 (H) 06/25/2011   PROT 7.6 08/16/2017   ALBUMIN 3.7 08/16/2017   BILITOT 0.6 08/16/2017   ALKPHOS 130 (H) 08/16/2017   AST 17 08/16/2017   ALT 10 08/16/2017  .   Total Discharge time is about 33 minutes  Roxan Hockey M.D on 05/20/2018 at 4:39 PM  Go to [email or web address] -  for contact info  [facility] - Office  [phone]

## 2018-05-20 NOTE — Progress Notes (Addendum)
[facility] Progress Note   Subjective: Up in chair. Ace wrap LLE. Moderate pain in LLE. Discussed adm of SQ heparin.   Objective Vitals:   05/19/18 2330 05/20/18 0010 05/20/18 0046 05/20/18 0502  BP: 90/62 118/74 117/69 128/78  Pulse: 80 80 82 84  Resp:  16 17 18   Temp:  97.8 F (36.6 C) 97.6 F (36.4 C) 98.6 F (37 C)  TempSrc:  Oral Oral Oral  SpO2:  97% 96% 97%  Weight:      Height:       Physical Exam General: Pleasant older female Heart: E7,O3 2/6 systolic M.  Lungs: S, NT Abdomen: Active BS Extremities: Ace wrap LLE-toes warm good cap refill. No RLE.  Dialysis Access: RFA AVF + bruit   Additional Objective Labs: Basic Metabolic Panel: Recent Labs  Lab 05/18/18 2327 05/19/18 0557 05/20/18 0627  NA 133* 133* 134*  K 5.3* 5.2* 4.4  CL 94* 91* 93*  CO2 26 28 28   GLUCOSE 111* 98 130*  BUN 53* 56* 24*  CREATININE 10.44* 10.94* 6.14*  CALCIUM 9.5 9.6 9.1  PHOS 4.9*  --   --    Liver Function Tests: No results for input(s): AST, ALT, ALKPHOS, BILITOT, PROT, ALBUMIN in the last 168 hours. No results for input(s): LIPASE, AMYLASE in the last 168 hours. CBC: Recent Labs  Lab 05/18/18 2327 05/19/18 0557 05/19/18 2004 05/20/18 0627  WBC 6.0 5.3 5.5 4.9  NEUTROABS 4.2  --   --   --   HGB 10.0* 10.0* 9.3* 8.5*  HCT 30.9* 30.2* 29.4* 26.8*  MCV 90.1 90.7 91.3 91.8  PLT 100* 104* 92* 90*   Blood Culture No results found for: SDES, SPECREQUEST, CULT, REPTSTATUS  Cardiac Enzymes: No results for input(s): CKTOTAL, CKMB, CKMBINDEX, TROPONINI in the last 168 hours. CBG: No results for input(s): GLUCAP in the last 168 hours. Iron Studies: No results for input(s): IRON, TIBC, TRANSFERRIN, FERRITIN in the last 72 hours. @lablastinr3 @ Studies/Results: Dg Tibia/fibula Left  Result Date: 05/19/2018 CLINICAL DATA:  Tibial fracture repair FLUOROSCOPY TIME:  2 minutes and 37 seconds. Images: 8 EXAM: LEFT TIBIA AND FIBULA - 2 VIEW COMPARISON:  None.  FINDINGS: By the end of the study, an intramedullary rod crosses the mid tibial fracture with proximal and distal interlocking screws. A mid fibular fractures again noted. IMPRESSION: Tibial fracture repair as above.  Mid fibular fracture. Electronically Signed   By: Dorise Bullion III M.D   On: 05/19/2018 16:36   Ct Knee Left Wo Contrast  Result Date: 05/19/2018 CLINICAL DATA:  Knee pain after falling. EXAM: CT OF THE LEFT KNEE WITHOUT CONTRAST TECHNIQUE: Multidetector CT imaging of the left knee was performed according to the standard protocol. Multiplanar CT image reconstructions were also generated. COMPARISON:  Radiographs earlier the same date. FINDINGS: Bones/Joint/Cartilage The bones appear mildly demineralized. There is no evidence of proximal tibial or fibular fracture. The known distal diaphyseal fractures are not included on this CT of the knee. The distal femur and patella are intact. The joint spaces are preserved. No significant joint effusion. Ligaments Suboptimally assessed by CT. The cruciate ligaments appear grossly intact. Muscles and Tendons Unremarkable. Soft tissues There is no focal fluid collection or inflammatory changes surrounding the knee. Popliteal atherosclerosis noted. IMPRESSION: Essentially normal CT of the left knee. No evidence of proximal tibial fracture. Known distal tibial and fibular diaphyseal fractures are not included on this study. Electronically Signed   By: Richardean Sale M.D.   On:  05/19/2018 08:30   Dg Tibia/fibula Left Port  Result Date: 05/19/2018 CLINICAL DATA:  Postop left tibial repair EXAM: PORTABLE LEFT TIBIA AND FIBULA - 2 VIEW COMPARISON:  None. FINDINGS: Antegrade intramedullary nail of the left tibia traversing a mid shaft fracture in near anatomic alignment. Mild medial displacement of midshaft fibular fracture. Expected postoperative gas. IMPRESSION: Near anatomic alignment of tibial fracture status post ORIF. Electronically Signed   By: Ulyses Jarred M.D.   On: 05/19/2018 18:28   Dg C-arm 1-60 Min  Result Date: 05/19/2018 CLINICAL DATA:  Left tibial nail. FLUOROSCOPY TIME:  2 minutes and 37 seconds. Images: 8 EXAM: DG C-ARM 61-120 MIN COMPARISON:  None. FINDINGS: The comminuted displaced mid fibular fracture is again identified. An intramedullary rod now crosses the mid tibial fracture. Proximal and distal interlocking screws are noted. IMPRESSION: Intramedullary rod now crosses the mid tibial fracture with proximal and distal interlocking screws. A mid fibular fracture is again noted. Electronically Signed   By: Dorise Bullion III M.D   On: 05/19/2018 16:35   Medications: . sodium chloride     . acetaminophen  1,000 mg Oral Q8H  . aspirin EC  81 mg Oral Daily  . atorvastatin  40 mg Oral QHS  . carvedilol  12.5 mg Oral BID  . docusate sodium  100 mg Oral BID  . doxercalciferol  2 mcg Intravenous Q M,W,F-HD  . ferric citrate  210 mg Oral TID WC  . isosorbide mononitrate  30 mg Oral Daily  . lisinopril  2.5 mg Oral Daily  . pantoprazole  40 mg Oral Daily     [place] MWF 3 hr 15 min 160NRe 350/Autoflow 1.5 60.5 Kg 2.0 K/2.25 Ca  -Heparin 2500 units IV initial bolus, Heparin 1000 units IV mid run TIW -Hectorol 2 mcg IV TIW (last PTH 718 04/28/18 -Parsabiv 2.5 mg IV TIW  -Mircera 30 mcg IV q 4 weeks (last dose 04/21/18 Last HGB 11.2 05/12/18) BMD meds:  Auryxia binders 210 mg 2 tabs PO TID AC (Last phos 5.4 04/28/18)   Assessment/Plan: 1.  Mechanical Fall/L tib/fib fx-S/P L IMN per Dr.Varkey. Per primary. Doing well. Will use SQ heparin 5000 units BID for DVT prophylaxis.  2.  ESRD -  MWF. Next HD tomorrow, hopefully at [place]. Hold regular heparin.   3.  Hypertension/volume  - BP controlled. No evidence of volume overload. HD 04/15 Pre wt 62.7 kg Net UF 600 cc Post wt not done. Get weight today.  4.  Anemia  - HGB 11.2 on adm now down to 8.5. Will increase ESA dose and give tomorrow with HD.  Follow HGB.  5.   Metabolic bone disease - Ca 9.5 Phos 4.9. Continue binders, VDRA. Parsabiv not available on hospital formulary.  6.  Nutrition -Renal diet, renal vit and nepro.   Rita H. Brown NP-C 05/20/2018, 11:57 AM  [facility] [phone]  Pt seen, examined and agree w A/P as above.  [facility] 05/20/2018, 3:26 PM

## 2018-05-20 NOTE — Plan of Care (Signed)
Pt DC and has met goals.

## 2018-05-20 NOTE — Progress Notes (Signed)
ORTHOPAEDIC PROGRESS NOTE  s/p Procedure(s): Left INTRAMEDULLARY (IM) NAIL TIBIAL  SUBJECTIVE: Reports mild pain about operative site. No chest pain. No SOB. No nausea/vomiting. No other complaints.  OBJECTIVE: PE: Left lower extremity, dressing in place, wiggling toes, splint precludes further exam, warm well perfused foot.  Vitals:   05/20/18 0046 05/20/18 0502  BP: 117/69 128/78  Pulse: 82 84  Resp: 17 18  Temp: 97.6 F (36.4 C) 98.6 F (37 C)  SpO2: 96% 97%     ASSESSMENT: Kendra Clay is a 58 y.o. female doing well postoperatively.  PLAN: Weightbearing: TDWB LLE Insicional and dressing care: Dressings left intact until follow-up Orthopedic device(s): None Showering: Keep dressing and splint dry. VTE prophylaxis: Heparin 5000 units twice daily per nephrology team. 6 weeks. Pain control: Minimize narcotics. Follow - up plan: 1 week, okay for discharge from my perspective when cleared by physical therapy. Contact information:  Weekdays 8-5 Ophelia Charter MD [phone], After hours and holidays please check [email or web address] for group call information for [facility]

## 2018-05-20 NOTE — TOC Transition Note (Signed)
Transition of Care [facility]) - CM/SW Discharge Note   Patient Details  Name: ITALI MCKENDRY MRN: [id number] Date of Birth: Mar 15, 1960  Transition of Care [facility]) CM/SW Contact:  Carles Collet, RN Phone Number: 05/20/2018, 1:39 PM   Clinical Narrative:     Damaris Schooner w patient and reviewed Medicare Harper ratings. She would like to use [employer], referral accepted by Oswald Hillock. Patient would like RW and 3/1 delivered to room prior to dc. Requested delivery by [employer].     Expected Discharge Plan and Services Barriers to Discharge: No Barriers Identified Expected Discharge Plan: [place] Choice: Home Health, Durable Medical Equipment   Expected Discharge Date: 05/20/18               DME Arranged: 3-N-1, Walker rolling DME Agency: [facility] HH Arranged: PT, OT HH Agency: [facility]    Final next level of care: [place] Barriers to Discharge: No Barriers Identified   Patient Goals and CMS Choice Patient states their goals for this hospitalization and ongoing recovery are:: im going home CMS Medicare.gov Compare Post Acute Care list provided to:: Patient Choice offered to / list presented to : Patient  Discharge Placement                       Discharge Plan and Services     Post Acute Care Choice: Home Health, Durable Medical Equipment          DME Arranged: 3-N-1, Walker rolling DME Agency: [facility] HH Arranged: PT, OT HH Agency: [facility]   Social Determinants of Health (SDOH) Interventions     Readmission Risk Interventions No flowsheet data found.

## 2018-05-25 LAB — HEPATITIS B SURFACE ANTIGEN: Hepatitis B Surface Ag: NEGATIVE

## 2020-03-01 ENCOUNTER — Other Ambulatory Visit: Payer: Self-pay

## 2020-03-01 ENCOUNTER — Ambulatory Visit (INDEPENDENT_AMBULATORY_CARE_PROVIDER_SITE_OTHER): Payer: Medicare Other | Admitting: Internal Medicine

## 2020-03-01 ENCOUNTER — Encounter: Payer: Self-pay | Admitting: Internal Medicine

## 2020-03-01 VITALS — BP 130/80 | HR 79 | Ht 64.0 in | Wt 139.4 lb

## 2020-03-01 DIAGNOSIS — Z122 Encounter for screening for malignant neoplasm of respiratory organs: Secondary | ICD-10-CM | POA: Diagnosis not present

## 2020-03-01 DIAGNOSIS — J432 Centrilobular emphysema: Secondary | ICD-10-CM | POA: Diagnosis not present

## 2020-03-01 MED ORDER — ALBUTEROL SULFATE HFA 108 (90 BASE) MCG/ACT IN AERS: 2.0000 | INHALATION_SPRAY | Freq: Four times a day (QID) | RESPIRATORY_TRACT | 5 refills | Status: DC | PRN

## 2020-03-01 NOTE — Progress Notes (Signed)
Kendra Clay    VH:[id number]    1960/10/31  Primary Care Physician:Patient, [name]  Referring Physician: Justin Mend, MD [address] [place],  [place] [address] Reason for Consultation: shortness of breath Date of Consultation: 03/01/2020  Chief complaint:   Chief Complaint  Patient presents with  . Consult    Pt states she is here due to having complaints of SOB which she has had x2 months.     HPI:  Kendra Clay is a 60 y.o. woman with tobacco use disorder here for evaluation of dyspnea and emphysema She is the daughter of my patient Kendra Clay.   Her breathing is worse in the cold weather and when she is up walking around. She has been prescribed inhalers when she has had pneumonia or bronchitis in the past and they helped a little. She used to get annual bronchitis, but has only had it once since she quit smoking.  She is able to do her ADLS but does get sob more so than she feels she should.  She is especially concerned about lung cancer because her husband was a heavy smoker who died from lung cancer.   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 She additionally has cysts on her liver. She has been losing weight and had some decreased appetite over the last couple of months. She thinks it could be related to fluid. Dialysis is MWF.   Social history: Smoking history: 45 pack years, quit in 2018, quit using vaping, now done with vaping.   Social History   Occupational History  . Not on file  Tobacco Use  . Smoking status: Former Smoker    Packs/day: 1.50    Years: 30.00    Pack years: 45.00    Types: Cigarettes    Quit date: 01/10/2016    Years since quitting: 4.1  . Smokeless tobacco: Former Systems developer    Quit date: 01/09/2014  Substance and Sexual Activity  . Alcohol use: No    Alcohol/week: 0.0 standard drinks  . Drug use: No  . Sexual activity: Not on file    Relevant family history:  Family History  Problem Relation Age of  Onset  . Arthritis Mother   . Kidney disease Father   . Polycystic kidney disease Son   . Asthma Son   . Hypertension Maternal Grandmother     Past Medical History:  Diagnosis Date  . Complication of anesthesia   . 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 Heart murmur    "slight" per ? Dr Deatra Ina 30 years ago. 2D ECHO  30 yearsa go.  Marland Kitchen History of blood transfusion    C- Section  . History of bronchitis    numerous, last time> 1 year  . Hypertension   . Night muscle spasms    legs  . Polycystic kidney disease    Genetic dx 23 years ago  . PONV (postoperative nausea and vomiting)    patch helped  . Scoliosis   . Strabismus     Past Surgical History:  Procedure Laterality Date  . ABDOMINAL HYSTERECTOMY    . AV FISTULA PLACEMENT  09/01/2011   Procedure: ARTERIOVENOUS (AV) FISTULA CREATION;  Surgeon: Rosetta Posner, MD;  Location: [place];  Service: Vascular;  Laterality: Right;  . CESAREAN SECTION  1997  . EYE SURGERY     for lazy eye  .  TIBIA IM NAIL INSERTION Left 05/19/2018   Procedure: INTRAMEDULLARY (IM) NAIL TIBIAL;  Surgeon: Hiram Gash, MD;  Location: [place];  Service: Orthopedics;  Laterality: Left;     Physical Exam: Blood pressure 130/80, pulse 79, height '5\' 4"'$  (1.626 m), weight 139 lb 6.4 oz (63.2 kg), SpO2 98 %. Gen:      No acute distress ENT:  no nasal polyps, mucus membranes moist Lungs:    No increased respiratory effort, symmetric chest wall excursion, clear to auscultation bilaterally, no wheezes or crackles CV:         Regular rate and rhythm; systolic murmur, rubs, or gallops.  No pedal edema Abd:      +abdominal distension with cystic liver palpated, otherwise soft MSK: no acute synovitis of DIP or PIP joints, no mechanics hands.  Skin:      Warm and dry; no rashes Neuro: normal speech, no focal facial asymmetry Psych: alert and oriented x3, normal mood and affect Ext: RUE fistula with +thrill and bruit  Data Reviewed/Medical Decision  Making:  Independent interpretation of tests: Imaging: . Review of patient's CT A/P in 2017 images revealed polycystic liver and kidneys, centrilobular emphysema. The patient's images have been independently reviewed by me.    PFTs: None on file   Labs:  Lab Results  Component Value Date   WBC 4.9 05/20/2018   HGB 8.5 (L) 05/20/2018   HCT 26.8 (L) 05/20/2018   MCV 91.8 05/20/2018   PLT 90 (L) 05/20/2018   Lab Results  Component Value Date   NA 134 (L) 05/20/2018   K 4.4 05/20/2018   CL 93 (L) 05/20/2018   CO2 28 05/20/2018     Immunization status:  Immunization History  Administered Date(s) Administered  . Hepatitis B, adult 05/26/2012, 06/30/2012, 07/28/2012, 11/24/2012  . Influenza Split 10/29/2018  . Influenza,inj,Quad PF,6+ Mos 11/07/2019  . PFIZER(Purple Top)SARS-COV-2 Vaccination 04/15/2019, 05/13/2019, 11/02/2019  . Pneumococcal Conjugate-13 05/15/2014  . Pneumococcal Polysaccharide-23 05/12/2012    . I reviewed prior external note(s) from hospital stay . I reviewed the result(s) of the labs and imaging as noted above.  . I have ordered PFT   Assessment:  Centrilobular Emphysema ESRD on HD Polycystic Kidney and Liver Disease  Plan/Recommendations: Ms. Naeem has worsening dyspnea which may be related to progression of emphysema.  This is a new diagnosis. We discussed disease management and progression at length today.  We will obtain PFTs for a baseline. I think she would eventually be a good candidate for Pulmonary rehab.  I will place Referral for lung cancer screening with Eric Form We will start Albuterol prn  Return to Care: Return in about 2 months (around 04/29/2020).  Lenice Llamas, MD Pulmonary and [place]  CC: Justin Mend, MD

## 2020-03-01 NOTE — Patient Instructions (Signed)
The patient should have follow up scheduled with myself in 1 months.   Prior to next visit patient should have: Full set of PFTs - 1 hour  I am also referring you to our lung cancer screening program. Please get a primary care doctor!  Take the albuterol rescue inhaler every 4 to 6 hours as needed for wheezing or shortness of breath. You can also take it 15 minutes before exercise or exertional activity. Side effects include heart racing or pounding, jitters or anxiety. If you have a history of an irregular heart rhythm, it can make this worse. Can also give some patients a hard time sleeping.  Understanding COPD   What is COPD? COPD stands for chronic obstructive pulmonary (lung) disease. COPD is a general term used for several lung diseases.  COPD is an umbrella term and encompasses other  common diseases in this group like chronic bronchitis and emphysema. Chronic asthma may also be included in this group. While some patients with COPD have only chronic bronchitis or emphysema, most patients have a combination of both.  You might hear these terms used in exchange for one another.   COPD adds to the work of the heart. Diseased lungs may reduce the amount of oxygen that goes to the blood. High blood pressure in blood vessels from the heart to the lungs makes it difficult for the heart to pump. Lung disease can also cause the body to produce too many red blood cells which may make the blood thicker and harder to pump.   Patients who have COPD with low oxygen levels may develop an enlarged heart (cor pulmonale). This condition weakens the heart and causes increased shortness of breath and swelling in the legs and feet.   Chronic bronchitis Chronic bronchitis is irritation and inflammation (swelling) of the lining in the bronchial tubes (air passages). The irritation causes coughing and an excess amount of mucus in the airways. The swelling makes it difficult to get air in and out of the lungs.  The small, hair-like structures on the inside of the airways (called cilia) may be damaged by the irritation. The cilia are then unable to help clean mucus from the airways.  Bronchitis is generally considered to be chronic when you have: a productive cough (cough up mucus) and shortness of breath that lasts about 3 months or more each year for 2 or more years in a row. Your doctor may define chronic bronchitis differently.   Emphysema Emphysema is the destruction, or breakdown, of the walls of the alveoli (air sacs) located at the end of the bronchial tubes. The damaged alveoli are not able to exchange oxygen and carbon dioxide between the lungs and the blood. The bronchioles lose their elasticity and collapse when you exhale, trapping air in the lungs. The trapped air keeps fresh air and oxygen from entering the lungs.   Who is affected by COPD? Emphysema and chronic bronchitis affect approximately 16 million people in the [place], or close to 11 percent of the population.   Symptoms of COPD   Shortness of breath   Shortness of breath with mild exercise (walking, using the stairs, etc.)   Chronic, productive cough (with mucus)   A feeling of "tightness" in the chest   Wheezing   What causes COPD? The two primary causes of COPD are cigarette smoking and alpha1-antitrypsin (AAT) deficiency. Air pollution and occupational dusts may also contribute to COPD, especially when the person exposed to these substances is a cigarette  smoker.  Cigarette smoke causes COPD by irritating the airways and creating inflammation that narrows the airways, making it more difficult to breathe. Cigarette smoke also causes the cilia to stop working properly so mucus and trapped particles are not cleaned from the airways. As a result, chronic cough and excess mucus production develop, leading to chronic bronchitis.  In some people, chronic bronchitis and infections can lead to destruction of the small airways,  or emphysema.  AAT deficiency, an inherited disorder, can also lead to emphysema. Alpha antitrypsin (AAT) is a protective material produced in the liver and transported to the lungs to help combat inflammation. When there is not enough of the chemical AAT, the body is no longer protected from an enzyme in the white blood cells.   How is COPD diagnosed?  To diagnose COPD, the physician needs to know: . Do you smoke?  . Have you had chronic exposure to dust or air pollutants?  . Do other members of your family have lung disease?  Marland Kitchen Are you short of breath?  . Do you get short of breath with exercise?  Marland Kitchen Do you have chronic cough and/or wheezing?  Marland Kitchen Do you cough up excess mucus?  To help with the diagnosis, the physician will conduct a thorough physical exam which includes:  1. Listening to your lungs and heart  2. Checking your blood pressure and pulse  3. Examining your nose and throat  4. Checking your feet and ankles for swelling   Laboratory and other tests Several laboratory and other tests are needed to confirm a diagnosis of COPD. These tests may include:  . Chest X-ray to look for lung changes that could be caused by COPD  .  Spirometry and pulmonary function tests (PFTs) to determine lung volume and air flow  . Pulse oximetry to measure the saturation of oxygen in the blood  . Arterial blood gases (ABGs) to determine the amount of oxygen and carbon dioxide in the blood  . Exercise testing to determine if the oxygen level in the blood drops during exercise   Treatment In the beginning stages of COPD, there is minimal shortness of breath that may be noticed only during exercise. As the disease progresses, shortness of breath may worsen and you may need to wear an oxygen device.   To help control other symptoms of COPD, the following treatments and lifestyle changes may be prescribed.  . Quitting smoking  . Avoiding cigarette smoke and other irritants  . Taking medications  including: a. bronchodilators b. anti-inflammatory agents c. oxygen d. antibiotics  . Maintaining a healthy diet  . Following a structured exercise program such as pulmonary rehabilitation . Preventing respiratory infections  . Controlling stress   If your COPD progresses, you may be eligible to be evaluated for lung volume reduction surgery or lung transplantation. You may also be eligible to participate in certain clinical trials (research studies). Ask your health care providers about studies being conducted in your hospital.   What is the outlook? Although COPD can not be cured, its symptoms can be treated and your quality of life can be improved. Your prognosis or outlook for the future will depend on how well your lungs are functioning, your symptoms, and how well you respond to and follow your treatment plan.

## 2020-03-14 ENCOUNTER — Other Ambulatory Visit: Payer: Self-pay | Admitting: *Deleted

## 2020-03-14 DIAGNOSIS — Z87891 Personal history of nicotine dependence: Secondary | ICD-10-CM

## 2020-04-05 ENCOUNTER — Encounter: Payer: Medicare Other | Admitting: Primary Care

## 2020-04-10 ENCOUNTER — Encounter: Payer: Self-pay | Admitting: Primary Care

## 2020-04-10 ENCOUNTER — Ambulatory Visit
Admission: RE | Admit: 2020-04-10 | Discharge: 2020-04-10 | Disposition: A | Discharge status: Home / Self Care | Payer: Medicare Other | Source: Ambulatory Visit | Attending: Acute Care | Admitting: Acute Care

## 2020-04-10 ENCOUNTER — Ambulatory Visit (INDEPENDENT_AMBULATORY_CARE_PROVIDER_SITE_OTHER): Payer: Medicare Other | Admitting: Primary Care

## 2020-04-10 ENCOUNTER — Other Ambulatory Visit: Payer: Self-pay

## 2020-04-10 VITALS — BP 130/78 | HR 76 | Temp 97.8°F | Ht 64.0 in | Wt 130.8 lb

## 2020-04-10 DIAGNOSIS — Z87891 Personal history of nicotine dependence: Secondary | ICD-10-CM | POA: Diagnosis not present

## 2020-04-10 NOTE — Patient Instructions (Signed)
Thank you for participating in the [facility] Cancer Screening Program. It was our pleasure to meet you today. We will call you with the results of your scan within the next few days. Your scan will be assigned a Lung RADS category score by the physicians reading the scans.  This Lung RADS score determines follow up scanning.  See below for description of categories, and follow up screening recommendations. We will be in touch to schedule your follow up screening annually or based on recommendations of our providers. We will fax a copy of your scan results to your Primary Care Physician, or the physician who referred you to the program, to ensure they have the results. Please call the office if you have any questions or concerns regarding your scanning experience or results.  Our office number is [phone]. Please speak with Denise Phelps, RN. She is our Lung Cancer Screening RN. If she is unavailable when you call, please have the office staff send her a message. She will return your call at her earliest convenience. Remember, if your scan is normal, we will scan you annually as long as you continue to meet the criteria for the program. (Age 55-77, Current smoker or smoker who has quit within the last 15 years). If you are a smoker, remember, quitting is the single most powerful action that you can take to decrease your risk of lung cancer and other pulmonary, breathing related problems. We know quitting is hard, and we are here to help.  Please let us know if there is anything we can do to help you meet your goal of quitting. If you are a former smoker, congratulations. We are proud of you! Remain smoke free! Remember you can refer friends or family members through the number above.  We will screen them to make sure they meet criteria for the program. Thank you for helping us take better care of you by participating in Lung Screening.  Lung RADS Categories:  Lung RADS 1: no nodules  or definitely non-concerning nodules.  Recommendation is for a repeat annual scan in 12 months.  Lung RADS 2:  nodules that are non-concerning in appearance and behavior with a very low likelihood of becoming an active cancer. Recommendation is for a repeat annual scan in 12 months.  Lung RADS 3: nodules that are probably non-concerning , includes nodules with a low likelihood of becoming an active cancer.  Recommendation is for a 6-month repeat screening scan. Often noted after an upper respiratory illness. We will be in touch to make sure you have no questions, and to schedule your 6-month scan.  Lung RADS 4 A: nodules with concerning findings, recommendation is most often for a follow up scan in 3 months or additional testing based on our provider's assessment of the scan. We will be in touch to make sure you have no questions and to schedule the recommended 3 month follow up scan.  Lung RADS 4 B:  indicates findings that are concerning. We will be in touch with you to schedule additional diagnostic testing based on our provider's  assessment of the scan.   

## 2020-04-10 NOTE — Progress Notes (Signed)
Shared Decision Making Visit Lung Cancer Screening Program [phone])   Eligibility:  Age 60 y.o.  Pack Years Smoking History Calculation 57 (# packs/per year x # years smoked)  Recent History of coughing up blood  no  Unexplained weight loss? no ( >Than 15 pounds within the last 6 months )  Prior History Lung / other cancer no (Diagnosis within the last 5 years already requiring surveillance chest CT Scans).  Smoking Status Former Smoker  Former Smokers: Years since quit: 6 years  Quit Date: 2016  Visit Components:  Discussion included one or more decision making aids. yes  Discussion included risk/benefits of screening. yes  Discussion included potential follow up diagnostic testing for abnormal scans. yes  Discussion included meaning and risk of over diagnosis. yes  Discussion included meaning and risk of False Positives. yes  Discussion included meaning of total radiation exposure. yes  Counseling Included:  Importance of adherence to annual lung cancer LDCT screening. yes  Impact of comorbidities on ability to participate in the program. yes  Ability and willingness to under diagnostic treatment. yes  Smoking Cessation Counseling:  Current Smokers:   Discussed importance of smoking cessation. yes  Information about tobacco cessation classes and interventions provided to patient. yes  Patient provided with "ticket" for LDCT Scan. yes  Symptomatic Patient. no  Counseling(Intermediate counseling: > three minutes) [id number]  Diagnosis Code: Tobacco Use Z72.0  Asymptomatic Patient yes  Counseling (Intermediate counseling: > three minutes counseling) [id number]  Former Smokers:   Discussed the importance of maintaining cigarette abstinence. yes  Diagnosis Code: Personal History of Nicotine Dependence. [id number]  Information about tobacco cessation classes and interventions provided to patient. Yes  Patient provided with "ticket" for LDCT Scan. yes  Written  Order for Lung Cancer Screening with LDCT placed in Epic. Yes (CT Chest Lung Cancer Screening Low Dose W/O CM) [id number] Z12.2-Screening of respiratory organs Z87.891-Personal history of nicotine dependence  I have spent 25 minutes of face to face time with Ms Caspary discussing the risks and benefits of lung cancer screening. We viewed a power point together that explained in detail the above noted topics. We paused at intervals to allow for questions to be asked and answered to ensure understanding.We discussed that the single most powerful action that she can take to decrease her risk of developing lung cancer is to quit smoking. We discussed whether or not she is ready to commit to setting a quit date. We discussed options for tools to aid in quitting smoking including nicotine replacement therapy, non-nicotine medications, support groups, Quit Smart classes, and behavior modification. We discussed that often times setting smaller, more achievable goals, such as eliminating 1 cigarette a day for a week and then 2 cigarettes a day for a week can be helpful in slowly decreasing the number of cigarettes smoked. This allows for a sense of accomplishment as well as providing a clinical benefit. I gave her the " Be Stronger Than Your Excuses" card with contact information for community resources, classes, free nicotine replacement therapy, and access to mobile apps, text messaging, and on-line smoking cessation help. I have also given her Magnus Sinning card and contact information in the event she needs to contact her. We discussed the time and location of the scan, and that either Doroteo Glassman RN or I will call with the results within 24-48 hours of receiving them. I have offered her  a copy of the power point we viewed  as a resource in the event they  need reinforcement of the concepts we discussed today in the office. The patient verbalized understanding of all of  the above and had no further questions upon  leaving the office. They have my contact information in the event they have any further questions.  I spent 3 minutes counseling on smoking cessation and the health risks of continued tobacco abuse.  I explained to the patient that there has been a high incidence of coronary artery disease noted on these exams. I explained that this is a non-gated exam therefore degree or severity cannot be determined. This patient is on statin therapy. I have asked the patient to follow-up with their PCP regarding any incidental finding of coronary artery disease and management with diet or medication as their PCP  feels is clinically indicated. The patient verbalized understanding of the above and had no further questions upon completion of the visit.  Patient is on HD for end stage renal disease Follow-up with Dr. Shearon Stalls 04/19/20 with PFTs    Martyn Ehrich, NP

## 2020-04-17 NOTE — Progress Notes (Signed)
Please call patient and let them  know their  low dose Ct was read as a Lung RADS 2: nodules that are benign in appearance and behavior with a very low likelihood of becoming a clinically active cancer due to size or lack of growth. Recommendation per radiology is for a repeat LDCT in 12 months. .Please let them  know we will order and schedule their  annual screening scan for 04/2021. Please let them  know there was notation of CAD on their  scan.  Please remind the patient  that this is a non-gated exam therefore degree or severity of disease  cannot be determined. Please have them  follow up with their PCP regarding potential risk factor modification, dietary therapy or pharmacologic therapy if clinically indicated. Pt.  is  currently on statin therapy. Please place order for annual  screening scan for  04/2021 and fax results to PCP. Thanks so much.  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 She is a dialysis patient, this is an previously identified finding. Thanks so much

## 2020-04-18 ENCOUNTER — Other Ambulatory Visit: Payer: Self-pay | Admitting: *Deleted

## 2020-04-18 DIAGNOSIS — Z87891 Personal history of nicotine dependence: Secondary | ICD-10-CM

## 2020-04-18 DIAGNOSIS — J432 Centrilobular emphysema: Secondary | ICD-10-CM

## 2020-04-18 NOTE — Progress Notes (Signed)
pft  

## 2020-04-19 ENCOUNTER — Ambulatory Visit: Payer: Medicare Other | Admitting: Internal Medicine

## 2020-04-24 ENCOUNTER — Inpatient Hospital Stay (HOSPITAL_COMMUNITY)
Admission: EM | Admit: 2020-04-24 | Discharge: 2020-04-26 | DRG: 535 | Disposition: A | Discharge status: Home Health | Payer: Medicare Other | Attending: Internal Medicine | Admitting: Internal Medicine

## 2020-04-24 ENCOUNTER — Emergency Department (HOSPITAL_COMMUNITY): Payer: Medicare Other

## 2020-04-24 ENCOUNTER — Encounter (HOSPITAL_COMMUNITY): Payer: Self-pay | Admitting: Student

## 2020-04-24 DIAGNOSIS — I160 Hypertensive urgency: Secondary | ICD-10-CM

## 2020-04-24 DIAGNOSIS — Y9301 Activity, walking, marching and hiking: Secondary | ICD-10-CM | POA: Diagnosis present

## 2020-04-24 DIAGNOSIS — M419 Scoliosis, unspecified: Secondary | ICD-10-CM | POA: Diagnosis present

## 2020-04-24 DIAGNOSIS — W19XXXA Unspecified fall, initial encounter: Secondary | ICD-10-CM

## 2020-04-24 DIAGNOSIS — S32599A Other specified fracture of unspecified pubis, initial encounter for closed fracture: Secondary | ICD-10-CM | POA: Diagnosis present

## 2020-04-24 DIAGNOSIS — S329XXA Fracture of unspecified parts of lumbosacral spine and pelvis, initial encounter for closed fracture: Secondary | ICD-10-CM | POA: Diagnosis not present

## 2020-04-24 DIAGNOSIS — I5042 Chronic combined systolic (congestive) and diastolic (congestive) heart failure: Secondary | ICD-10-CM | POA: Diagnosis present

## 2020-04-24 DIAGNOSIS — J9601 Acute respiratory failure with hypoxia: Secondary | ICD-10-CM | POA: Diagnosis present

## 2020-04-24 DIAGNOSIS — E785 Hyperlipidemia, unspecified: Secondary | ICD-10-CM

## 2020-04-24 DIAGNOSIS — Z8249 Family history of ischemic heart disease and other diseases of the circulatory system: Secondary | ICD-10-CM

## 2020-04-24 DIAGNOSIS — Z825 Family history of asthma and other chronic lower respiratory diseases: Secondary | ICD-10-CM

## 2020-04-24 DIAGNOSIS — Z992 Dependence on renal dialysis: Secondary | ICD-10-CM

## 2020-04-24 DIAGNOSIS — Z841 Family history of disorders of kidney and ureter: Secondary | ICD-10-CM

## 2020-04-24 DIAGNOSIS — K219 Gastro-esophageal reflux disease without esophagitis: Secondary | ICD-10-CM | POA: Diagnosis present

## 2020-04-24 DIAGNOSIS — J449 Chronic obstructive pulmonary disease, unspecified: Secondary | ICD-10-CM

## 2020-04-24 DIAGNOSIS — U071 COVID-19: Secondary | ICD-10-CM

## 2020-04-24 DIAGNOSIS — Y92512 Supermarket, store or market as the place of occurrence of the external cause: Secondary | ICD-10-CM

## 2020-04-24 DIAGNOSIS — S32592A Other specified fracture of left pubis, initial encounter for closed fracture: Secondary | ICD-10-CM | POA: Diagnosis not present

## 2020-04-24 DIAGNOSIS — E8770 Fluid overload, unspecified: Secondary | ICD-10-CM | POA: Diagnosis present

## 2020-04-24 DIAGNOSIS — W1830XA Fall on same level, unspecified, initial encounter: Secondary | ICD-10-CM | POA: Diagnosis present

## 2020-04-24 DIAGNOSIS — Z882 Allergy status to sulfonamides status: Secondary | ICD-10-CM

## 2020-04-24 DIAGNOSIS — Q613 Polycystic kidney, unspecified: Secondary | ICD-10-CM

## 2020-04-24 DIAGNOSIS — Z888 Allergy status to other drugs, medicaments and biological substances status: Secondary | ICD-10-CM

## 2020-04-24 DIAGNOSIS — Z8261 Family history of arthritis: Secondary | ICD-10-CM

## 2020-04-24 DIAGNOSIS — N186 End stage renal disease: Secondary | ICD-10-CM | POA: Diagnosis present

## 2020-04-24 DIAGNOSIS — Z87891 Personal history of nicotine dependence: Secondary | ICD-10-CM

## 2020-04-24 DIAGNOSIS — J440 Chronic obstructive pulmonary disease with acute lower respiratory infection: Secondary | ICD-10-CM | POA: Diagnosis present

## 2020-04-24 DIAGNOSIS — J441 Chronic obstructive pulmonary disease with (acute) exacerbation: Secondary | ICD-10-CM

## 2020-04-24 DIAGNOSIS — R0902 Hypoxemia: Secondary | ICD-10-CM

## 2020-04-24 DIAGNOSIS — Z79899 Other long term (current) drug therapy: Secondary | ICD-10-CM

## 2020-04-24 DIAGNOSIS — I132 Hypertensive heart and chronic kidney disease with heart failure and with stage 5 chronic kidney disease, or end stage renal disease: Secondary | ICD-10-CM | POA: Diagnosis present

## 2020-04-24 LAB — BASIC METABOLIC PANEL
Anion gap: 8 (ref 5–15)
BUN: 17 mg/dL (ref 6–20)
CO2: 29 mmol/L (ref 22–32)
Calcium: 8.8 mg/dL — ABNORMAL LOW (ref 8.9–10.3)
Chloride: 92 mmol/L — ABNORMAL LOW (ref 98–111)
Creatinine, Ser: 6.93 mg/dL — ABNORMAL HIGH (ref 0.44–1.00)
GFR, Estimated: 6 mL/min — ABNORMAL LOW (ref >60)
Glucose, Bld: 99 mg/dL (ref 70–99)
Potassium: 3.1 mmol/L — ABNORMAL LOW (ref 3.5–5.1)
Sodium: 129 mmol/L — ABNORMAL LOW (ref 135–145)

## 2020-04-24 LAB — CBC WITH DIFFERENTIAL/PLATELET
Abs Immature Granulocytes: 0 10*3/uL (ref 0.00–0.07)
Basophils Absolute: 0 10*3/uL (ref 0.0–0.1)
Basophils Relative: 0 %
Eosinophils Absolute: 0 10*3/uL (ref 0.0–0.5)
Eosinophils Relative: 0 %
HCT: 30.1 % — ABNORMAL LOW (ref 36.0–46.0)
Hemoglobin: 9.5 g/dL — ABNORMAL LOW (ref 12.0–15.0)
Lymphocytes Relative: 18 %
Lymphs Abs: 0.9 10*3/uL (ref 0.7–4.0)
MCH: 30.9 pg (ref 26.0–34.0)
MCHC: 31.6 g/dL (ref 30.0–36.0)
MCV: 98 fL (ref 80.0–100.0)
Monocytes Absolute: 0.3 10*3/uL (ref 0.1–1.0)
Monocytes Relative: 7 %
Neutro Abs: 3.6 10*3/uL (ref 1.7–7.7)
Neutrophils Relative %: 75 %
Platelets: 116 10*3/uL — ABNORMAL LOW (ref 150–400)
RBC: 3.07 MIL/uL — ABNORMAL LOW (ref 3.87–5.11)
RDW: 16.2 % — ABNORMAL HIGH (ref 11.5–15.5)
WBC: 4.8 10*3/uL (ref 4.0–10.5)
nRBC: 0 % (ref 0.0–0.2)

## 2020-04-24 LAB — RESP PANEL BY RT-PCR (FLU A&B, COVID) ARPGX2
Influenza A by PCR: NEGATIVE
Influenza B by PCR: NEGATIVE
SARS Coronavirus 2 by RT PCR: POSITIVE — AB

## 2020-04-24 MED ORDER — LIDOCAINE 5 % EX PTCH
1.0000 | MEDICATED_PATCH | CUTANEOUS | Status: DC
  Administered 2020-04-24 – 2020-04-25 (×2): 1 via TRANSDERMAL
  Filled 2020-04-24 (×2): qty 1

## 2020-04-24 MED ORDER — ACETAMINOPHEN 325 MG PO TABS
650.0000 mg | ORAL_TABLET | Freq: Once | ORAL | Status: AC
  Administered 2020-04-24: 650 mg via ORAL
  Filled 2020-04-24: qty 2

## 2020-04-24 MED ORDER — FENTANYL CITRATE (PF) 100 MCG/2ML IJ SOLN
25.0000 ug | Freq: Once | INTRAMUSCULAR | Status: AC
  Administered 2020-04-24: 25 ug via INTRAVENOUS
  Filled 2020-04-24: qty 2

## 2020-04-24 NOTE — ED Notes (Signed)
Pt ambulated with walker she put all weight on right leg only. Patient say she can only put a little weight on the left leg.

## 2020-04-24 NOTE — ED Triage Notes (Signed)
BB PTAR, Pt fell tripping on flip flop, drove home and was unable to get out of car. Shortening and rotation to left hip.

## 2020-04-24 NOTE — ED Provider Notes (Signed)
5:09 PM signout from [place] PA-C at shift change.   Patient with fall and hip and pelvic pain today.  Patient's initial x-rays were negative, prompting CT of the pelvis which demonstrates an inferior pubic rami fracture on the left side.  Patient was given pain medication earlier with oxygen desaturation.  She continues to have pain with movement.  Currently she lives at home with her son who does assist her.  She does have a walker to use if needed.  Patient goes to dialysis Monday, Wednesday, Friday -- last dialyzed yesterday.  Will attempt to mobilize patient again.   BP (!) 173/89   Pulse 88   Temp 97.9 F (36.6 C) (Oral)   Resp 20   SpO2 93%   6:41 PM patient ambulated a few feet in the hallway but did so with difficulty per her report.  Per NT, O2 started at 88% and went down to 85% after ambulation.  At this point, I feel that patient would be high likelihood to missed dialysis if she were to be sent home at this time.  Will discuss with hospitalist when labs return.  8:45 PM Discussed with Dr. Marlowe Sax of [facility] who will see patient.   BP (!) 169/77   Pulse 78   Temp 97.9 F (36.6 C) (Oral)   Resp 18   SpO2 98%     Carlisle Cater, PA-C 04/24/20 2045    Wyvonnia Dusky, MD 04/25/20 1155

## 2020-04-24 NOTE — ED Provider Notes (Signed)
[facility] Provider Note   CSN: [id number] Arrival date & time: 04/24/20  1319     History Chief Complaint  Patient presents with   [facility]    [facility] is a 60 y.o. female with a history of polycystic kidney disease, ESRD on dialysis MWF, and anemia who presents to the emergency department via EMS status post mechanical fall with complaints of left hip pain.  Patient states that she was walking in flip-flops when her shoe got stuck causing her to fall onto her left hip.  She denies head injury or loss of consciousness.  She was able to pull herself into her car and drive home but then was unable to get out of her vehicle.  She is having pain to the left hip and the lower back, it is constant, worse with attempts of movement, no alleviating factors.  She denies any other areas of injury.  She denies headache, neck pain, abdominal pain, chest pain, numbness, tingling, or weakness.  Her last dialysis session was yesterday.  HPI     Past Medical History:  Diagnosis Date   Complication of anesthesia    Enlarged liver    secondary to PKD   Heart murmur    "slight" per ? Dr Deatra Ina 30 years ago. 2D ECHO  30 yearsa go.   History of blood transfusion    C- Section   History of bronchitis    numerous, last time> 1 year   Hypertension    Night muscle spasms    legs   Polycystic kidney disease    Genetic dx 23 years ago   PONV (postoperative nausea and vomiting)    patch helped   Scoliosis    Strabismus     Patient Active Problem List   Diagnosis Date Noted   History of anemia due to CKD 05/19/2018   Chronic HFrEF (heart failure with reduced ejection fraction) ([place]) 05/19/2018   Tibia/fibula fracture, left, closed, initial encounter 05/18/2018   SOB (shortness of breath) 08/14/2015   Acute respiratory failure with hypoxia ([facility]) 08/14/2015   Normocytic normochromic anemia 08/14/2015   Polycystic kidney disease  07/17/2013   End stage renal disease ([place]) 08/26/2011    Past Surgical History:  Procedure Laterality Date   ABDOMINAL HYSTERECTOMY     AV FISTULA PLACEMENT  09/01/2011   Procedure: ARTERIOVENOUS (AV) FISTULA CREATION;  Surgeon: Rosetta Posner, MD;  Location: [facility];  Service: Vascular;  Laterality: Right;   [place]     for lazy eye   TIBIA IM NAIL INSERTION Left 05/19/2018   Procedure: INTRAMEDULLARY (IM) NAIL TIBIAL;  Surgeon: Hiram Gash, MD;  Location: [facility];  Service: Orthopedics;  Laterality: Left;     OB History   No obstetric history on file.     Family History  Problem Relation Age of Onset   Arthritis Mother    Kidney disease Father    Polycystic kidney disease Son    Asthma Son    Hypertension Maternal Grandmother     Social History   Tobacco Use   Smoking status: Former Smoker    Packs/day: 1.50    Years: 30.00    Pack years: 45.00    Types: Cigarettes    Start date: 1982    Quit date: 01/10/2015    Years since quitting: 5.2   Smokeless tobacco: Former Systems developer    Quit date: 01/09/2014  Substance Use Topics  Alcohol use: No    Alcohol/week: 0.0 standard drinks   Drug use: No    Home Medications Prior to Admission medications   Medication Sig Start Date End Date Taking? Authorizing Provider  acetaminophen (TYLENOL) 500 MG tablet Take 2 tablets (1,000 mg total) by mouth every 8 (eight) hours. 05/20/18   Roxan Hockey, MD  albuterol (VENTOLIN HFA) 108 (90 Base) MCG/ACT inhaler Inhale 2 puffs into the lungs every 6 (six) hours as needed. 03/01/20   Spero Geralds, MD  atorvastatin (LIPITOR) 40 MG tablet Take 40 mg by mouth daily.    [provider]  carvedilol (COREG) 12.5 MG tablet Take 12.5 mg by mouth 2 (two) times daily. 06/11/17   [provider]  ferric citrate (AURYXIA) 1 GM 210 MG(Fe) tablet Take 210 mg by mouth 3 (three) times daily with meals.    [provider]  isosorbide  mononitrate (IMDUR) 30 MG 24 hr tablet Take 30 mg by mouth daily. 08/14/16   [provider]  loratadine (CLARITIN) 10 MG tablet Take 10 mg by mouth daily as needed for allergies.    [provider]  losartan (COZAAR) 25 MG tablet Take 25 mg by mouth daily. 05/05/18   [provider]  nitroGLYCERIN (NITROSTAT) 0.4 MG SL tablet Place 0.4 mg under the tongue every 5 (five) minutes as needed for chest pain.    [provider]  pantoprazole (PROTONIX) 20 MG tablet Take 20 mg by mouth daily.  08/08/17   [provider]  polyethylene glycol powder (MIRALAX) powder Start taking 1 capful 3 times a day. Slowly cut back as needed until you have normal bowel movements. Patient taking differently: Take 17 g by mouth daily as needed for mild constipation. 08/17/17   Cardama, Grayce Sessions, MD    Allergies    E-mycin [erythromycin], Sulfa antibiotics, and Lisinopril  Review of Systems   Review of Systems  Constitutional: Negative for chills and fever.  Respiratory: Negative for shortness of breath.   Cardiovascular: Negative for chest pain.  Gastrointestinal: Negative for abdominal pain.  Musculoskeletal: Positive for arthralgias and back pain. Negative for neck pain.  Neurological: Negative for syncope, weakness and numbness.  All other systems reviewed and are negative.   Physical Exam Updated Vital Signs BP (!) 181/98 (BP Location: Left Arm)    Pulse 74    Temp 97.9 F (36.6 C) (Oral)    Resp 16    SpO2 96%   Physical Exam Vitals and nursing note reviewed.  Constitutional:      General: She is not in acute distress.    Appearance: She is well-developed.  HENT:     Head: Normocephalic and atraumatic. No raccoon eyes or Battle's sign.     Right Ear: No hemotympanum.     Left Ear: No hemotympanum.  Eyes:     General:        Right eye: No discharge.        Left eye: No discharge.     Conjunctiva/sclera: Conjunctivae normal.  Cardiovascular:     Rate  and Rhythm: Normal rate and regular rhythm.     Heart sounds: No murmur heard.     Comments: 2+ symmetric DP pulses bilaterally. Pulmonary:     Effort: No respiratory distress.     Breath sounds: Normal breath sounds. No wheezing or rales.  Abdominal:     General: There is no distension.     Palpations: Abdomen is soft.     Tenderness:  There is no abdominal tenderness. There is no guarding or rebound.  Musculoskeletal:     Cervical back: Neck supple. No tenderness. No spinous process tenderness.     Comments: Upper extremities: Patient has a palpable thrill to her AV fistula in the right forearm.  She is actively ranging at all major joints in the upper extremities.  She has no focal areas of bony tenderness Back: Patient has some tenderness to palpation to the lower lumbar midline.  Otherwise nontender throughout the spine.  No palpable step-off. Lower extremities: No obvious deformities.  No significant open wounds.  Patient able to flex the right knee without difficulty, significant pain when attempting to do this with the left lower extremity.  She is unable to lift either of her extremities off of the bed due to pain in the left hip.  She is tender to palpation to the left anterior lateral hip.  She also had tenderness to palpation to the anterior lower pelvis. Otherwise nontender lower extremities.  Skin:    General: Skin is warm and dry.     Findings: No rash.  Neurological:     Comments: Alert.  Clear speech.  Sensation grossly intact bilateral lower extremities.  5-5 strength with plantar and dorsiflexion bilaterally.  Psychiatric:        Behavior: Behavior normal.     ED Results / Procedures / Treatments   Labs (all labs ordered are listed, but only abnormal results are displayed) Labs Reviewed - No data to display  EKG None  Radiology DG Lumbar Spine Complete  Result Date: 04/24/2020 CLINICAL DATA:  Fall. EXAM: LUMBAR SPINE - COMPLETE 4+ VIEW COMPARISON:  CT  08/16/2017. FINDINGS: Mild scoliosis concave left. Diffuse osteopenia and degenerative change. No acute bony abnormality. No evidence of fracture. Aortoiliac and visceral atherosclerotic vascular calcification. IMPRESSION: Mild scoliosis concave left. Diffuse osteopenia and degenerative change. No acute bony abnormality identified. Electronically Signed   By: Marcello Moores  Register   On: 04/24/2020 14:48   DG Pelvis 1-2 Views  Result Date: 04/24/2020 CLINICAL DATA:  Pelvic pain after a fall today.  Initial encounter. EXAM: PELVIS - 1-2 VIEW COMPARISON:  None. FINDINGS: There is no evidence of pelvic fracture or diastasis. No pelvic bone lesions are seen. IMPRESSION: Negative exam. Electronically Signed   By: Inge Rise M.D.   On: 04/24/2020 14:49   DG Femur Min 2 Views Left  Result Date: 04/24/2020 CLINICAL DATA:  Left upper leg pain after a fall today. Initial encounter. EXAM: LEFT FEMUR 2 VIEWS COMPARISON:  None. FINDINGS: There is no evidence of fracture or other focal bone lesions. Soft tissues are unremarkable. IMPRESSION: Negative exam. Electronically Signed   By: Inge Rise M.D.   On: 04/24/2020 14:48    Procedures Procedures   Medications Ordered in ED Medications  fentaNYL (SUBLIMAZE) injection 25 mcg (25 mcg Intravenous Given 04/24/20 1337)    ED Course  I have reviewed the triage vital signs and the nursing notes.  Pertinent labs & imaging results that were available during my care of the patient were reviewed by me and considered in my medical decision making (see chart for details).    MDM Rules/Calculators/A&P                          Patient presents to the ED with complaints of hip pain S/p fall. Nontoxic, vitals without significant abnormality.  Plan for x-rays of the pelvis/femur/L spine.  Small dose of fentanyl  for pain.   Additional history obtained:  Additional history obtained from chart review & nursing note review.   Imaging Studies ordered:  I  ordered imaging studies which included X-rays of the L spine, femur, and pelvis, I independently reviewed, formal radiology impression shows:  L spine: Mild scoliosis concave left. Diffuse osteopenia and degenerative change. No acute bony abnormality identified.  Femur: Negative exam. Pelvis:  Negative exam. ED Course:  Patient with some desaturation, likely secondary to fentanyl- supplemental oxygen applied, remains with significant pain in the lower pelvis/L hip with movement. Will obtain CT pelvis wo contrast. Trial lidoderm patch & tylenol to help with further pain control.   15:20: Patient care signed out to PA [facility] @ change of shift pending CT & disposition.   Findings and plan of care discussed with supervising physician Dr. Alvino Chapel who is in agreement.   Portions of this note were generated with Lobbyist. Dictation errors may occur despite best attempts at proofreading.  Final Clinical Impression(s) / ED Diagnoses Final diagnoses:  Fall, initial encounter    Rx / DC Orders ED Discharge Orders    None       Leafy Kindle 04/24/20 1530    Davonna Belling, MD 04/26/20 [phone]

## 2020-04-25 ENCOUNTER — Encounter (HOSPITAL_COMMUNITY): Payer: Self-pay | Admitting: Internal Medicine

## 2020-04-25 ENCOUNTER — Other Ambulatory Visit: Payer: Self-pay

## 2020-04-25 ENCOUNTER — Observation Stay (HOSPITAL_COMMUNITY): Payer: Medicare Other

## 2020-04-25 DIAGNOSIS — I160 Hypertensive urgency: Secondary | ICD-10-CM

## 2020-04-25 DIAGNOSIS — Z8249 Family history of ischemic heart disease and other diseases of the circulatory system: Secondary | ICD-10-CM | POA: Diagnosis not present

## 2020-04-25 DIAGNOSIS — Y92512 Supermarket, store or market as the place of occurrence of the external cause: Secondary | ICD-10-CM | POA: Diagnosis not present

## 2020-04-25 DIAGNOSIS — Y9301 Activity, walking, marching and hiking: Secondary | ICD-10-CM | POA: Diagnosis present

## 2020-04-25 DIAGNOSIS — Z882 Allergy status to sulfonamides status: Secondary | ICD-10-CM | POA: Diagnosis not present

## 2020-04-25 DIAGNOSIS — Z825 Family history of asthma and other chronic lower respiratory diseases: Secondary | ICD-10-CM | POA: Diagnosis not present

## 2020-04-25 DIAGNOSIS — Z79899 Other long term (current) drug therapy: Secondary | ICD-10-CM | POA: Diagnosis not present

## 2020-04-25 DIAGNOSIS — E8770 Fluid overload, unspecified: Secondary | ICD-10-CM | POA: Diagnosis present

## 2020-04-25 DIAGNOSIS — J441 Chronic obstructive pulmonary disease with (acute) exacerbation: Secondary | ICD-10-CM

## 2020-04-25 DIAGNOSIS — Z888 Allergy status to other drugs, medicaments and biological substances status: Secondary | ICD-10-CM | POA: Diagnosis not present

## 2020-04-25 DIAGNOSIS — S32592A Other specified fracture of left pubis, initial encounter for closed fracture: Principal | ICD-10-CM

## 2020-04-25 DIAGNOSIS — E785 Hyperlipidemia, unspecified: Secondary | ICD-10-CM | POA: Diagnosis present

## 2020-04-25 DIAGNOSIS — U071 COVID-19: Secondary | ICD-10-CM | POA: Diagnosis present

## 2020-04-25 DIAGNOSIS — J449 Chronic obstructive pulmonary disease, unspecified: Secondary | ICD-10-CM | POA: Diagnosis not present

## 2020-04-25 DIAGNOSIS — Z8261 Family history of arthritis: Secondary | ICD-10-CM | POA: Diagnosis not present

## 2020-04-25 DIAGNOSIS — I5042 Chronic combined systolic (congestive) and diastolic (congestive) heart failure: Secondary | ICD-10-CM | POA: Diagnosis present

## 2020-04-25 DIAGNOSIS — J440 Chronic obstructive pulmonary disease with acute lower respiratory infection: Secondary | ICD-10-CM | POA: Diagnosis present

## 2020-04-25 DIAGNOSIS — I132 Hypertensive heart and chronic kidney disease with heart failure and with stage 5 chronic kidney disease, or end stage renal disease: Secondary | ICD-10-CM | POA: Diagnosis present

## 2020-04-25 DIAGNOSIS — K219 Gastro-esophageal reflux disease without esophagitis: Secondary | ICD-10-CM | POA: Diagnosis present

## 2020-04-25 DIAGNOSIS — N186 End stage renal disease: Secondary | ICD-10-CM

## 2020-04-25 DIAGNOSIS — Z87891 Personal history of nicotine dependence: Secondary | ICD-10-CM | POA: Diagnosis not present

## 2020-04-25 DIAGNOSIS — M419 Scoliosis, unspecified: Secondary | ICD-10-CM | POA: Diagnosis present

## 2020-04-25 DIAGNOSIS — S329XXA Fracture of unspecified parts of lumbosacral spine and pelvis, initial encounter for closed fracture: Secondary | ICD-10-CM | POA: Diagnosis present

## 2020-04-25 DIAGNOSIS — J9601 Acute respiratory failure with hypoxia: Secondary | ICD-10-CM | POA: Diagnosis present

## 2020-04-25 DIAGNOSIS — Z841 Family history of disorders of kidney and ureter: Secondary | ICD-10-CM | POA: Diagnosis not present

## 2020-04-25 DIAGNOSIS — W1830XA Fall on same level, unspecified, initial encounter: Secondary | ICD-10-CM | POA: Diagnosis present

## 2020-04-25 DIAGNOSIS — Q613 Polycystic kidney, unspecified: Secondary | ICD-10-CM | POA: Diagnosis not present

## 2020-04-25 DIAGNOSIS — Z992 Dependence on renal dialysis: Secondary | ICD-10-CM | POA: Diagnosis not present

## 2020-04-25 LAB — COMPREHENSIVE METABOLIC PANEL
ALT: 11 U/L (ref 0–44)
AST: 19 U/L (ref 15–41)
Albumin: 2.6 g/dL — ABNORMAL LOW (ref 3.5–5.0)
Alkaline Phosphatase: 99 U/L (ref 38–126)
Anion gap: 10 (ref 5–15)
BUN: 21 mg/dL — ABNORMAL HIGH (ref 6–20)
CO2: 28 mmol/L (ref 22–32)
Calcium: 8.9 mg/dL (ref 8.9–10.3)
Chloride: 93 mmol/L — ABNORMAL LOW (ref 98–111)
Creatinine, Ser: 7.88 mg/dL — ABNORMAL HIGH (ref 0.44–1.00)
GFR, Estimated: 5 mL/min — ABNORMAL LOW (ref >60)
Glucose, Bld: 87 mg/dL (ref 70–99)
Potassium: 3.3 mmol/L — ABNORMAL LOW (ref 3.5–5.1)
Sodium: 131 mmol/L — ABNORMAL LOW (ref 135–145)
Total Bilirubin: 1.1 mg/dL (ref 0.3–1.2)
Total Protein: 6 g/dL — ABNORMAL LOW (ref 6.5–8.1)

## 2020-04-25 LAB — FERRITIN: Ferritin: 1214 ng/mL — ABNORMAL HIGH (ref 11–307)

## 2020-04-25 LAB — HIV ANTIBODY (ROUTINE TESTING W REFLEX): HIV Screen 4th Generation wRfx: NONREACTIVE

## 2020-04-25 LAB — PROCALCITONIN: Procalcitonin: 1.68 ng/mL

## 2020-04-25 LAB — LACTATE DEHYDROGENASE: LDH: 174 U/L (ref 98–192)

## 2020-04-25 LAB — FIBRINOGEN: Fibrinogen: 258 mg/dL (ref 210–475)

## 2020-04-25 LAB — C-REACTIVE PROTEIN: CRP: 0.8 mg/dL (ref <1.0)

## 2020-04-25 LAB — D-DIMER, QUANTITATIVE: D-Dimer, Quant: 3.74 ug/mL-FEU — ABNORMAL HIGH (ref 0.00–0.50)

## 2020-04-25 MED ORDER — FERRIC CITRATE 1 GM 210 MG(FE) PO TABS
210.0000 mg | ORAL_TABLET | Freq: Three times a day (TID) | ORAL | Status: DC
  Administered 2020-04-25 (×3): 210 mg via ORAL
  Filled 2020-04-25 (×6): qty 1

## 2020-04-25 MED ORDER — LOSARTAN POTASSIUM 50 MG PO TABS
25.0000 mg | ORAL_TABLET | Freq: Every day | ORAL | Status: DC
  Administered 2020-04-25: 25 mg via ORAL
  Filled 2020-04-25 (×2): qty 1

## 2020-04-25 MED ORDER — PANTOPRAZOLE SODIUM 40 MG PO TBEC
40.0000 mg | DELAYED_RELEASE_TABLET | Freq: Two times a day (BID) | ORAL | Status: DC
  Administered 2020-04-25 – 2020-04-26 (×4): 40 mg via ORAL
  Filled 2020-04-25 (×4): qty 1

## 2020-04-25 MED ORDER — REMDESIVIR 100 MG IV SOLR: 100.0000 mg | Freq: Once | INTRAVENOUS | Status: DC

## 2020-04-25 MED ORDER — CARVEDILOL 12.5 MG PO TABS
12.5000 mg | ORAL_TABLET | Freq: Two times a day (BID) | ORAL | Status: DC
Start: 2020-04-25 — End: 2020-04-27
  Administered 2020-04-25 (×2): 12.5 mg via ORAL
  Filled 2020-04-25 (×2): qty 1
  Filled 2020-04-25 (×2): qty 4

## 2020-04-25 MED ORDER — ACETAMINOPHEN 325 MG PO TABS: 650.0000 mg | ORAL_TABLET | Freq: Four times a day (QID) | ORAL | Status: DC | PRN

## 2020-04-25 MED ORDER — SODIUM CHLORIDE 0.9 % IV SOLN
100.0000 mg | Freq: Every day | INTRAVENOUS | Status: DC
  Administered 2020-04-26: 100 mg via INTRAVENOUS
  Filled 2020-04-25 (×3): qty 20

## 2020-04-25 MED ORDER — ALBUTEROL SULFATE HFA 108 (90 BASE) MCG/ACT IN AERS: 2.0000 | INHALATION_SPRAY | Freq: Four times a day (QID) | RESPIRATORY_TRACT | Status: DC | PRN

## 2020-04-25 MED ORDER — ISOSORBIDE MONONITRATE ER 30 MG PO TB24
30.0000 mg | ORAL_TABLET | Freq: Every day | ORAL | Status: DC
  Administered 2020-04-25: 30 mg via ORAL
  Filled 2020-04-25 (×2): qty 1

## 2020-04-25 MED ORDER — HEPARIN SODIUM (PORCINE) 5000 UNIT/ML IJ SOLN
5000.0000 [IU] | Freq: Three times a day (TID) | INTRAMUSCULAR | Status: DC
  Administered 2020-04-25 – 2020-04-26 (×4): 5000 [IU] via SUBCUTANEOUS
  Filled 2020-04-25 (×4): qty 1

## 2020-04-25 MED ORDER — SODIUM CHLORIDE 0.9 % IV SOLN
200.0000 mg | Freq: Once | INTRAVENOUS | Status: AC
  Administered 2020-04-25: 200 mg via INTRAVENOUS
  Filled 2020-04-25: qty 40

## 2020-04-25 MED ORDER — CHLORHEXIDINE GLUCONATE CLOTH 2 % EX PADS
6.0000 | MEDICATED_PAD | Freq: Every day | CUTANEOUS | Status: DC
  Administered 2020-04-26: 6 via TOPICAL

## 2020-04-25 MED ORDER — HYDRALAZINE HCL 20 MG/ML IJ SOLN
5.0000 mg | INTRAMUSCULAR | Status: DC | PRN
  Administered 2020-04-25: 5 mg via INTRAVENOUS
  Filled 2020-04-25: qty 1

## 2020-04-25 MED ORDER — ATORVASTATIN CALCIUM 40 MG PO TABS
40.0000 mg | ORAL_TABLET | Freq: Every day | ORAL | Status: DC
  Administered 2020-04-25 (×2): 40 mg via ORAL
  Filled 2020-04-25 (×2): qty 1

## 2020-04-25 MED ORDER — SODIUM CHLORIDE 0.9 % IV SOLN: 100.0000 mg | Freq: Once | INTRAVENOUS | Status: DC

## 2020-04-25 NOTE — ED Notes (Signed)
omly has pain with movement

## 2020-04-25 NOTE — Progress Notes (Signed)
Occupational Therapy Evaluation Patient Details Name: [facility] MRN: [id number] DOB: 1960/08/29 Today's Date: 04/25/2020    History of Present Illness 60 y.o. female ESRD on HD MWF, chronic combined/systolic heart failure, COPD, former tobacco use-who sustained a mechanical fall-subsequently was found to have left pubic rami fracture. WBAT.   Clinical Impression   PTA pt lives with her son and is independent with ADL and mobility, including driving herself to dialysis. Pt able to mobilize with min A @ RW level and complete LB ADL tasks with mod A. Pt states her son will be able to assist at DC and declines [facility] services. Pt is concerned about how she will get to dialysis as her son does not drive. SpO2 remained above 90 during activity on RA with minimal complaints of SOB. Will follow acutely to maximize functional level of independence with nec AE/DME to facilitate safe DC home with son.     Follow Up Recommendations  No OT follow up;Supervision - Intermittent;Other (comment) (Pt declines HH services; S with all mobility and ADL initially)    Equipment Recommendations       Recommendations for Other Services       Precautions / Restrictions Precautions Precautions: Fall Restrictions Weight Bearing Restrictions: No Other Position/Activity Restrictions: WBAT      Mobility Bed Mobility Overal bed mobility: Needs Assistance Bed Mobility: Supine to Sit;Sit to Supine     Supine to sit: Min assist Sit to supine: Min assist   General bed mobility comments: a to lift LLE; educated on using RLE under LLE to assist with lifting    Transfers Overall transfer level: Needs assistance Equipment used: Rolling walker (2 wheeled) Transfers: Sit to/from Stand Sit to Stand: Min assist         General transfer comment: vc for hand placement; education on use of RW to offload L hip    Balance Overall balance assessment: Needs assistance;History of Falls   Sitting balance-Leahy  Scale: Good       Standing balance-Leahy Scale: Poor Standing balance comment: reliant on use of RW                           ADL either performed or assessed with clinical judgement   ADL Overall ADL's : Needs assistance/impaired     Grooming: Set up;Sitting   Upper Body Bathing: Set up;Sitting   Lower Body Bathing: Minimal assistance;Sit to/from stand   Upper Body Dressing : Set up;Sitting   Lower Body Dressing: Moderate assistance;Sit to/from stand   Toilet Transfer: Minimal assistance;RW;BSC;Ambulation   Toileting- Clothing Manipulation and Hygiene: Minimal assistance       Functional mobility during ADLs: Minimal assistance;Rolling walker;Cueing for safety General ADL Comments: limited ROM LLE limits ability to complete ADL; May benefit from AE; staes her son can help her     Vision         Perception     Praxis      Pertinent Vitals/Pain Pain Assessment: Faces Faces Pain Scale: Hurts even more Pain Location: L hip/pelvis with movement Pain Descriptors / Indicators: Discomfort;Grimacing;Guarding;Aching Pain Intervention(s): Limited activity within patient's tolerance;Repositioned     Hand Dominance Right   Extremity/Trunk Assessment Upper Extremity Assessment Upper Extremity Assessment: Overall WFL for tasks assessed (Fistula R forearm)   Lower Extremity Assessment Lower Extremity Assessment: Defer to PT evaluation   Cervical / Trunk Assessment Cervical / Trunk Assessment: Normal   Communication Communication Communication: No difficulties   Cognition  Arousal/Alertness: Awake/alert Behavior During Therapy: WFL for tasks assessed/performed Overall Cognitive Status: Within Functional Limits for tasks assessed                                     General Comments  SpO2 remained above 90 on RA during activity; minimal SOB; pt has orders for IS and flutter valve - not in room in ED    Exercises     Shoulder  Instructions      Home Living Family/patient expects to be discharged to:: Private residence Living Arrangements: Children Available Help at Discharge: Family;Available 24 hours/day Type of Home: House Home Access: Stairs to enter [employer] of Steps: 2 Entrance Stairs-Rails: None Home Layout: One level     Bathroom Shower/Tub: Tub/shower unit;Curtain   Biochemist, clinical: Standard     Home Equipment: Environmental consultant - 2 wheels;Cane - single point;Shower seat          Prior Functioning/Environment Level of Independence: Independent        Comments: drives herself to dialysis        OT Problem List: Decreased strength;Decreased range of motion;Decreased activity tolerance;Impaired balance (sitting and/or standing);Decreased safety awareness;Decreased knowledge of use of DME or AE;Cardiopulmonary status limiting activity;Pain      OT Treatment/Interventions: Self-care/ADL training;Therapeutic exercise;Energy conservation;DME and/or AE instruction;Therapeutic activities;Patient/family education;Balance training    OT Goals(Current goals can be found in the care plan section) Acute Rehab OT Goals Patient Stated Goal: to go home and be independent OT Goal Formulation: With patient Time For Goal Achievement: 05/08/20 Potential to Achieve Goals: Good  OT Frequency: Min 2X/week   Barriers to D/C:            Co-evaluation              AM-PAC OT "6 Clicks" Daily Activity     Outcome Measure Help from another person eating meals?: None Help from another person taking care of personal grooming?: A Little Help from another person toileting, which includes using toliet, bedpan, or urinal?: A Little Help from another person bathing (including washing, rinsing, drying)?: A Little Help from another person to put on and taking off regular upper body clothing?: A Little Help from another person to put on and taking off regular lower body clothing?: A Lot 6 Click Score:  18   End of Session Equipment Utilized During Treatment: Gait belt;Rolling walker Nurse Communication: Mobility status  Activity Tolerance: Patient tolerated treatment well Patient left: in bed;with call bell/phone within reach;Other (comment) (stretcher)  OT Visit Diagnosis: Unsteadiness on feet (R26.81);Other abnormalities of gait and mobility (R26.89);Muscle weakness (generalized) (M62.81);History of falling (Z91.81);Pain Pain - Right/Left: Left Pain - part of body: Hip                Time: [id number] OT Time Calculation (min): 26 min Charges:  OT General Charges $OT Visit: 1 Visit OT Evaluation $OT Eval Moderate Complexity: 1 Mod OT Treatments $Self Care/Home Management : 8-22 mins  Maurie Boettcher, OT/L   Acute OT Clinical Specialist [place] Pager [phone] Office [phone]   [facility] 04/25/2020, 2:37 PM

## 2020-04-25 NOTE — Progress Notes (Signed)
Renal Navigator spoke with Clinic Manager at patient's outpatient HD clinic/[facility], who states patient will continue to dialyze on MWF at discharge, but seat time will be 1:00pm in order to treat in isolation due to positive COVID test on 04/24/20. She will remain in isolation for 14 days from positive test. Nephrology team updated. Navigator will speak with patient/family to inquire about transportation plans to isolation seat time.  Alphonzo Cruise, [place] Renal Navigator [phone]

## 2020-04-25 NOTE — ED Notes (Signed)
PT desat to upper 80's. Pt placed back on 2 L. RN to paged MD [place].

## 2020-04-25 NOTE — ED Notes (Signed)
Dr Melvia Heaps is here to see this pt

## 2020-04-25 NOTE — H&P (Signed)
History and Physical    Kendra Clay [id number] DOB: 1960/11/01 DOA: 04/24/2020  PCP: Patient, [name] Patient coming from: Home  Chief Complaint: Fall, left hip pain  HPI: Kendra Clay is a 60 y.o. 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  Patient states she was walking to the grocery store and somehow her flip-flops got caught in something and she fell on her left hip.  Since then she is having difficulty moving her left leg and pain in her pelvic region.  Denies hitting her head at the time of the fall.  She is not on any blood thinners.  Patient is fully vaccinated against COVID including booster shot which was administered in September 2021.  She denies any fevers, chills, body aches, cough, shortness of breath, or chest pain.  Does state that last week on Thursday 3/17 she started having abdominal pain, vomiting, and diarrhea which lasted for 2 days and now symptoms have completely resolved.  Her last dialysis was on Monday 3/21 and reports compliance.  ED Course: Blood pressure elevated with systolic up to [id number]. In the ED, she was initially satting well on room air but after she was given fentanyl for pain, nursing staff reported that she desatted to the mid 80s with ambulation.  Labs showing WBC 4.8, hemoglobin 9.5 (at baseline), platelet count 116K (at baseline).  Sodium 129, potassium 3.1, chloride 92, bicarb 29, BUN 17, creatinine 6.9, glucose 99.  SARS-CoV-2 PCR test positive.  Influenza panel negative.  Chest x-ray showing findings consistent with COPD and mild pulmonary edema.  CT of pelvis showing acute left inferior pubic ramus fracture.  No femoral neck fracture.  X-rays of left femur and lumbar spine negative for acute finding.  Medications administered include  Tylenol, fentanyl, and Lidoderm patch.  Review of Systems:  All systems reviewed and apart from history of presenting illness, are negative.  Past Medical History:  Diagnosis Date  . Complication of anesthesia   . 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 Heart murmur    "slight" per ? Dr Deatra Ina 30 years ago. 2D ECHO  30 yearsa go.  Marland Kitchen History of blood transfusion    C- Section  . History of bronchitis    numerous, last time> 1 year  . Hypertension   . Night muscle spasms    legs  . Polycystic kidney disease    Genetic dx 23 years ago  . PONV (postoperative nausea and vomiting)    patch helped  . Scoliosis   . Strabismus     Past Surgical History:  Procedure Laterality Date  . ABDOMINAL HYSTERECTOMY    . AV FISTULA PLACEMENT  09/01/2011   Procedure: ARTERIOVENOUS (AV) FISTULA CREATION;  Surgeon: Rosetta Posner, MD;  Location: [place];  Service: Vascular;  Laterality: Right;  . CESAREAN SECTION  1997  . EYE SURGERY     for lazy eye  . TIBIA IM NAIL INSERTION Left 05/19/2018   Procedure: INTRAMEDULLARY (IM) NAIL TIBIAL;  Surgeon: Hiram Gash, MD;  Location: [facility];  Service: Orthopedics;  Laterality: Left;     reports that she quit smoking about 5 years ago. Her smoking use included cigarettes. She started smoking about 40 years ago. She has a 45.00 pack-year smoking history. She quit smokeless  tobacco use about 6 years ago. She reports that she does not drink alcohol and does not use drugs.  Allergies  Allergen Reactions  . E-Mycin [Erythromycin] Hives  . Sulfa Antibiotics Rash    Other reaction(s): Skin Rash Other reaction(s): Skin Rash   . Lisinopril Cough    Family History  Problem Relation Age of Onset  . Arthritis Mother   . Kidney disease Father   . Polycystic kidney disease Son   . Asthma Son   . Hypertension Maternal Grandmother     Prior to Admission medications   Medication Sig Start Date End Date Taking? Authorizing Provider  acetaminophen (TYLENOL) 500 MG  tablet Take 2 tablets (1,000 mg total) by mouth every 8 (eight) hours. Patient taking differently: Take 1,000 mg by mouth every 8 (eight) hours as needed for mild pain or headache. 05/20/18  Yes Emokpae, Courage, MD  albuterol (VENTOLIN HFA) 108 (90 Base) MCG/ACT inhaler Inhale 2 puffs into the lungs every 6 (six) hours as needed. Patient taking differently: Inhale 2 puffs into the lungs every 6 (six) hours as needed for wheezing or shortness of breath. 03/01/20  Yes Spero Geralds, MD  atorvastatin (LIPITOR) 40 MG tablet Take 40 mg by mouth at bedtime.   Yes [provider]  B Complex-C-Zn-Folic Acid (DIALYVITE Q000111Q WITH ZINC) 0.8 MG TABS Take 1 tablet by mouth at bedtime. 08/30/18  Yes [provider]  carvedilol (COREG) 12.5 MG tablet Take 12.5 mg by mouth 2 (two) times daily. 06/11/17  Yes [provider]  estradiol (ESTRACE) 0.1 MG/GM vaginal cream Place 1 Applicatorful vaginally at bedtime as needed (yeast infection). 03/30/20  Yes [provider]  ferric citrate (AURYXIA) 1 GM 210 MG(Fe) tablet Take 210 mg by mouth 3 (three) times daily with meals.   Yes [provider]  isosorbide mononitrate (IMDUR) 30 MG 24 hr tablet Take 30 mg by mouth at bedtime. 08/14/16  Yes [provider]  loratadine (CLARITIN) 10 MG tablet Take 10 mg by mouth daily as needed for allergies.   Yes [provider]  losartan (COZAAR) 25 MG tablet Take 25 mg by mouth daily. 05/05/18  Yes [provider]  nitroGLYCERIN (NITROSTAT) 0.4 MG SL tablet Place 0.4 mg under the tongue every 5 (five) minutes as needed for chest pain.   Yes [provider]  pantoprazole (PROTONIX) 40 MG tablet Take 40 mg by mouth 2 (two) times daily. 02/26/20  Yes [provider]  polyethylene glycol powder (MIRALAX) powder Start taking 1 capful 3 times a day. Slowly cut back as needed until you have normal bowel movements. Patient taking differently: Take 17 g by mouth  daily as needed for mild constipation. 08/17/17  Yes Fatima Blank, MD    Physical Exam: Vitals:   04/24/20 1615 04/24/20 1630 04/24/20 2015 04/25/20 0100  BP: (!) 181/97 (!) 173/89 (!) 169/77 (!) 181/94  Pulse: 89 88 78 75  Resp: '17 20 18 19  '$ Temp:      TempSrc:      SpO2: 96% 93% 98% 98%    Physical Exam Constitutional:      General: She is not in acute distress. HENT:     Head: Normocephalic and atraumatic.  Eyes:     Extraocular Movements: Extraocular movements intact.     Conjunctiva/sclera: Conjunctivae normal.  Cardiovascular:     Rate and Rhythm: Normal rate and regular rhythm.     Pulses: Normal pulses.  Pulmonary:  Effort: Pulmonary effort is normal. No respiratory distress.     Breath sounds: Rales present. No wheezing.  Abdominal:     General: Bowel sounds are normal. There is no distension.     Palpations: Abdomen is soft.     Tenderness: There is no abdominal tenderness.  Musculoskeletal:     Cervical back: Normal range of motion and neck supple.     Right lower leg: No edema.     Left lower leg: No edema.     Comments: Dorsalis pedis pulse +2 bilaterally  Skin:    General: Skin is warm and dry.  Neurological:     General: No focal deficit present.     Mental Status: She is alert and oriented to person, place, and time.     Labs on Admission: I have personally reviewed following labs and imaging studies  CBC: Recent Labs  Lab 04/24/20 1830  WBC 4.8  NEUTROABS 3.6  HGB 9.5*  HCT 30.1*  MCV 98.0  PLT 99991111*   Basic Metabolic Panel: Recent Labs  Lab 04/24/20 1830  NA 129*  K 3.1*  CL 92*  CO2 29  GLUCOSE 99  BUN 17  CREATININE 6.93*  CALCIUM 8.8*   GFR: CrCl cannot be calculated (Unknown ideal weight.). Liver Function Tests: No results for input(s): AST, ALT, ALKPHOS, BILITOT, PROT, ALBUMIN in the last 168 hours. No results for input(s): LIPASE, AMYLASE in the last 168 hours. No results for input(s): AMMONIA in the last  168 hours. Coagulation Profile: No results for input(s): INR, PROTIME in the last 168 hours. Cardiac Enzymes: No results for input(s): CKTOTAL, CKMB, CKMBINDEX, TROPONINI in the last 168 hours. BNP (last 3 results) No results for input(s): PROBNP in the last 8760 hours. HbA1C: No results for input(s): HGBA1C in the last 72 hours. CBG: No results for input(s): GLUCAP in the last 168 hours. Lipid Profile: No results for input(s): CHOL, HDL, LDLCALC, TRIG, CHOLHDL, LDLDIRECT in the last 72 hours. Thyroid Function Tests: No results for input(s): TSH, T4TOTAL, FREET4, T3FREE, THYROIDAB in the last 72 hours. Anemia Panel: No results for input(s): VITAMINB12, FOLATE, FERRITIN, TIBC, IRON, RETICCTPCT in the last 72 hours. Urine analysis:    Component Value Date/Time   COLORURINE STRAW (A) 08/16/2017 2339   APPEARANCEUR CLEAR 08/16/2017 2339   LABSPEC 1.005 08/16/2017 2339   PHURINE 9.0 (H) 08/16/2017 2339   GLUCOSEU NEGATIVE 08/16/2017 2339   HGBUR SMALL (A) 08/16/2017 2339   BILIRUBINUR NEGATIVE 08/16/2017 2339   BILIRUBINUR neg 06/25/2011 1726   KETONESUR NEGATIVE 08/16/2017 2339   PROTEINUR 30 (A) 08/16/2017 2339   UROBILINOGEN 0.2 06/25/2011 1726   NITRITE NEGATIVE 08/16/2017 2339   LEUKOCYTESUR TRACE (A) 08/16/2017 2339    Radiological Exams on Admission: DG Lumbar Spine Complete  Result Date: 04/24/2020 CLINICAL DATA:  Fall. EXAM: LUMBAR SPINE - COMPLETE 4+ VIEW COMPARISON:  CT 08/16/2017. FINDINGS: Mild scoliosis concave left. Diffuse osteopenia and degenerative change. No acute bony abnormality. No evidence of fracture. Aortoiliac and visceral atherosclerotic vascular calcification. IMPRESSION: Mild scoliosis concave left. Diffuse osteopenia and degenerative change. No acute bony abnormality identified. Electronically Signed   By: Marcello Moores  Register   On: 04/24/2020 14:48   DG Pelvis 1-2 Views  Result Date: 04/24/2020 CLINICAL DATA:  Pelvic pain after a fall today.  Initial  encounter. EXAM: PELVIS - 1-2 VIEW COMPARISON:  None. FINDINGS: There is no evidence of pelvic fracture or diastasis. No pelvic bone lesions are seen. IMPRESSION: Negative exam. Electronically Signed  By: Inge Rise M.D.   On: 04/24/2020 14:49   CT PELVIS WO CONTRAST  Result Date: 04/24/2020 CLINICAL DATA:  Fall with pelvic trauma.  Deformity of the left hip. EXAM: CT PELVIS WITHOUT CONTRAST TECHNIQUE: Multidetector CT imaging of the pelvis was performed following the standard protocol without intravenous contrast. COMPARISON:  Pelvis x-rays from earlier the same day. FINDINGS: Urinary Tract: Inferior kidneys are visualized extending down into the upper pelvis showing marked enlargement and cystic change. Imaging features are similar to abdomen pelvis CT of 08/16/2017 consistent with polycystic kidney disease. Bladder is decompressed. Bowel:  Unremarkable. Vascular/Lymphatic: No pelvic sidewall lymphadenopathy. Atherosclerotic calcification noted in the inferior aorta and common iliac arteries bilaterally. Reproductive: The uterus is surgically absent. There is no adnexal mass. Other:  No intraperitoneal free fluid. Musculoskeletal: Acute fracture noted inferior left pubic ramus (axial image 101/series 5). No left superior pubic ramus fracture evident although there is asymmetry of the SI joints. Left SI joint appears wider than the right although this appearance is similar to abdomen/pelvis CT of 08/16/2017 suggesting chronicity. No identifiable superior pubic ramus fracture on the left. No definite sacral fracture evident although assessment is limited by the marked osteopenia. Left femoral head is located. No discernible fracture in the left femoral neck. Soft tissue windows show no evidence for hematoma or hemorrhage in the soft tissues around the left hip. IMPRESSION: 1. Acute fracture of the inferior left pubic ramus. 2. No associated fracture of the left superior pubic ramus. No femoral neck  fracture evident. Asymmetry of the SI joints is similar to prior study from 3 years ago. MRI pelvis could be used to further evaluate as clinically warranted. 3. No evidence for hemorrhage or hematoma in the soft tissues around the left hip. 4. Polycystic kidney disease. 5. Aortic Atherosclerosis (ICD10-I70.0). Electronically Signed   By: Misty Stanley M.D.   On: 04/24/2020 16:34   DG Chest Port 1 View  Result Date: 04/24/2020 CLINICAL DATA:  Hypoxia EXAM: PORTABLE CHEST 1 VIEW COMPARISON:  12/23/2017 FINDINGS: Mild hyperinflation. Mild peribronchial opacities. No pleural effusion or pneumothorax. No focal consolidation. IMPRESSION: Mild hyperinflation and peribronchial opacities, which may indicate mild pulmonary edema superimposed on COPD. Electronically Signed   By: Ulyses Jarred M.D.   On: 04/24/2020 20:13   DG Femur Min 2 Views Left  Result Date: 04/24/2020 CLINICAL DATA:  Left upper leg pain after a fall today. Initial encounter. EXAM: LEFT FEMUR 2 VIEWS COMPARISON:  None. FINDINGS: There is no evidence of fracture or other focal bone lesions. Soft tissues are unremarkable. IMPRESSION: Negative exam. Electronically Signed   By: Inge Rise M.D.   On: 04/24/2020 14:48    Assessment/Plan Principal Problem:   Pubic ramus fracture (HCC) Active Problems:   COVID-19 virus infection   Hypertensive urgency   COPD (chronic obstructive pulmonary disease) (HCC)   HLD (hyperlipidemia)   Acute left inferior pubic ramus fracture -Pain control -her pain is currently well controlled with Tylenol and lidocaine patch.  Early mobilization, PT/OT.  Mild volume overload ESRD on HD MWF Chronic combined systolic and diastolic CHF Echo done July 2017 showing LVEF of 40% with diffuse hypokinesis and grade 1 diastolic dysfunction.  Patient reports compliance with dialysis and her last session was on Monday 3/21.  Chest x-ray showing mild pulmonary edema but does not appear volume overloaded on exam.   Reportedly not hypoxic on arrival to the ED but her sats dropped to mid 80s with ambulation after she was given fentanyl  for pain.  She was placed on 2 L supplemental oxygen and currently satting well.  No signs of respiratory distress. -Consult nephrology in the morning for dialysis.  COVID-19 infection SARS-CoV-2 PCR test positive.  Patient is fully vaccinated including booster shot which she received in September 2021.  However, she did have symptoms consistent with viral gastroenteritis a few days ago which have now resolved.  At present she is not endorsing any symptoms consistent with a respiratory illness.  Reportedly not hypoxic on arrival to the ED but sats dropped to the mid 80s with ambulation after she was given fentanyl for pain.  She was placed on 2 L supplemental oxygen and currently satting well with no signs of respiratory distress.  Chest x-ray showing findings consistent with COPD and mild pulmonary edema. -Airborne and contact precautions.  Check inflammatory markers.  Chest CT without contrast ordered for further evaluation.  Discussed giving 3-day course of remdesivir and patient agrees.  If CT reveals findings consistent with Covid pneumonia, start steroids and give remdesivir for 5 days.   Hypertensive urgency Systolic up to Q000111Q. -Continue home Coreg, Imdur, and losartan. IV Hydralazine PRN SBP >180.  COPD Stable.  No signs of acute exacerbation. -Continue home albuterol inhaler as needed  Hyperlipidemia -Continue Lipitor  GERD -Continue PPI  DVT prophylaxis: Subcutaneous heparin Code Status: Patient wishes to be full code. Family Communication: No family available at this time.  Diagnostic findings and treatment plan discussed with the patient at length. Disposition Plan: Status is: Observation  The patient remains OBS appropriate and will d/c before 2 midnights.  Dispo: The patient is from: Home              Anticipated d/c is to: Home              Patient  currently is not medically stable to d/c.   Difficult to place patient No  Level of care: Level of care: Telemetry Medical   The medical decision making on this patient was of high complexity and the patient is at high risk for clinical deterioration, therefore this is a level 3 visit.  Shela Leff MD [facility]  If 7PM-7AM, please contact night-coverage [email or web address]  04/25/2020, 2:12 AM

## 2020-04-25 NOTE — Consult Note (Signed)
Renal Service Consult Note [facility]  Kendra NEWSHAM 04/25/2020 Sol Blazing, MD Requesting Physician: Dr Sloan Leiter, Chauncey Cruel.   Reason for Consult: ESRD pt w/ fall and pelvic fracture HPI: The patient is a 60 y.o. year-old w/ hx of HTN, ESRD w/ PKD on HD MWF, anemia who fell and came to ED yesterday due to L leg pain and difficulty moving the left leg. In ED CT pelvis showed acute L inf pubic ramus fracture. No hip fx. Pt tested + for COVID 19.  No symptoms. Pt admitted, asked to see for dialysis.   Pt seen in room. Very good compliance w/ HD. No SOB, cough or CP. States L leg hard to move, very painful.  On HD x 8 years. Lives w/ her grown son at home.    ROS  denies CP  no joint pain   no HA  no blurry vision  no rash  no diarrhea  no nausea/ vomiting   Past Medical History  Past Medical History:  Diagnosis Date  . Complication of anesthesia   . 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 Heart murmur    "slight" per ? Dr Deatra Ina 30 years ago. 2D ECHO  30 yearsa go.  Marland Kitchen History of blood transfusion    C- Section  . History of bronchitis    numerous, last time> 1 year  . Hypertension   . Night muscle spasms    legs  . Polycystic kidney disease    Genetic dx 23 years ago  . PONV (postoperative nausea and vomiting)    patch helped  . Scoliosis   . Strabismus    Past Surgical History  Past Surgical History:  Procedure Laterality Date  . ABDOMINAL HYSTERECTOMY    . AV FISTULA PLACEMENT  09/01/2011   Procedure: ARTERIOVENOUS (AV) FISTULA CREATION;  Surgeon: Rosetta Posner, MD;  Location: [place];  Service: Vascular;  Laterality: Right;  . CESAREAN SECTION  1997  . EYE SURGERY     for lazy eye  . TIBIA IM NAIL INSERTION Left 05/19/2018   Procedure: INTRAMEDULLARY (IM) NAIL TIBIAL;  Surgeon: Hiram Gash, MD;  Location: [place];  Service: Orthopedics;  Laterality: Left;   Family History  Family History  Problem Relation Age of Onset  . Arthritis Mother   .  Kidney disease Father   . Polycystic kidney disease Son   . Asthma Son   . Hypertension Maternal Grandmother    Social History  reports that she quit smoking about 5 years ago. Her smoking use included cigarettes. She started smoking about 40 years ago. She has a 45.00 pack-year smoking history. She quit smokeless tobacco use about 6 years ago. She reports that she does not drink alcohol and does not use drugs. Allergies  Allergies  Allergen Reactions  . E-Mycin [Erythromycin] Hives  . Sulfa Antibiotics Rash    Other reaction(s): Skin Rash Other reaction(s): Skin Rash   . Lisinopril Cough   Home medications Prior to Admission medications   Medication Sig Start Date End Date Taking? Authorizing Provider  acetaminophen (TYLENOL) 500 MG tablet Take 2 tablets (1,000 mg total) by mouth every 8 (eight) hours. Patient taking differently: Take 1,000 mg by mouth every 8 (eight) hours as needed for mild pain or headache. 05/20/18  Yes Emokpae, Courage, MD  albuterol (VENTOLIN HFA) 108 (90 Base) MCG/ACT inhaler Inhale 2 puffs into the lungs every 6 (six) hours as needed. Patient taking differently: Inhale 2  puffs into the lungs every 6 (six) hours as needed for wheezing or shortness of breath. 03/01/20  Yes Spero Geralds, MD  atorvastatin (LIPITOR) 40 MG tablet Take 40 mg by mouth at bedtime.   Yes [provider]  B Complex-C-Zn-Folic Acid (DIALYVITE Q000111Q WITH ZINC) 0.8 MG TABS Take 1 tablet by mouth at bedtime. 08/30/18  Yes [provider]  carvedilol (COREG) 12.5 MG tablet Take 12.5 mg by mouth 2 (two) times daily. 06/11/17  Yes [provider]  estradiol (ESTRACE) 0.1 MG/GM vaginal cream Place 1 Applicatorful vaginally at bedtime as needed (yeast infection). 03/30/20  Yes [provider]  ferric citrate (AURYXIA) 1 GM 210 MG(Fe) tablet Take 210 mg by mouth 3 (three) times daily with meals.   Yes [provider]  isosorbide mononitrate (IMDUR) 30 MG 24 hr  tablet Take 30 mg by mouth at bedtime. 08/14/16  Yes [provider]  loratadine (CLARITIN) 10 MG tablet Take 10 mg by mouth daily as needed for allergies.   Yes [provider]  losartan (COZAAR) 25 MG tablet Take 25 mg by mouth daily. 05/05/18  Yes [provider]  nitroGLYCERIN (NITROSTAT) 0.4 MG SL tablet Place 0.4 mg under the tongue every 5 (five) minutes as needed for chest pain.   Yes [provider]  pantoprazole (PROTONIX) 40 MG tablet Take 40 mg by mouth 2 (two) times daily. 02/26/20  Yes [provider]  polyethylene glycol powder (MIRALAX) powder Start taking 1 capful 3 times a day. Slowly cut back as needed until you have normal bowel movements. Patient taking differently: Take 17 g by mouth daily as needed for mild constipation. 08/17/17  Yes Fatima Blank, MD     Vitals:   04/25/20 1100 04/25/20 1449 04/25/20 1450 04/25/20 1500  BP: (!) 174/87 (!) 173/90  (!) 176/89  Pulse: 71 72 74 73  Resp: 20 11 (!) 22 12  Temp:      TempSrc:      SpO2: 98% 91% 92% 94%   Exam Gen alert, no distress No rash, cyanosis or gangrene Sclera anicteric, throat clear  No jvd or bruits Chest R clear, L basilar rales 1/4 up RRR no MRG Abd soft ntnd no mass or ascites +bs GU defer MS no joint effusions or deformity Ext no LE or UE edema, no wounds or ulcers Neuro is alert, Ox 3 , nf RFA AVF+bruit       Home meds:  - coreg 12.5 bid/ cozaar 25 qd/ imdur 30 hs/ lipitor 40 hs  - auryxia 1 ac tid/ protonix 40 bid/ prn albuterol hfa  - prn's/ vitamins/ supplements   CT chest - IMPRESSION: 1. Cardiomegaly with findings of CHF and trace bilateral pleural effusions. 2. Aortic Atherosclerosis (ICD10-I70.0) and Emphysema (ICD10-J43.9).    Na 131 K 3.3 BUN 21  Cr 7.8    OP HD: [facility] MWF  3h 7mn  2/2 bath  58.5kg  Hep none  RFA AVF  - hect 2 ug tiw  - mircera 150 q2 last 3/16        Assessment/ Plan: 1. Fall./ acute L pelvic bone  fracture - admitted, per pmd 2. ESRD - on HD MWF. No sig SOB.  3. Vol / HTN - cont 2 home BP lowering meds. Has appearance of CHF by chest CT and by CXR, although CXR has chronic changes on old films. Not really symptomatic. Will plan HD either tonight or at the [facility] the morning  tomorrow.  4. COVID -19 infectino - per primary team.  5. HTN urgency - cont home meds, get vol down w/ HD 6. Anemia ckd - Hb 9.5, last esa on 3/16, next due on 3/30.  7. MBD Ckd - cont binder auryxia and vdra w/ HD      Kelly Splinter  MD 04/25/2020, 4:03 PM  Recent Labs  Lab 04/24/20 1830  WBC 4.8  HGB 9.5*   Recent Labs  Lab 04/24/20 1830 04/25/20 0410  K 3.1* 3.3*  BUN 17 21*  CREATININE 6.93* 7.88*  CALCIUM 8.8* 8.9

## 2020-04-25 NOTE — Evaluation (Signed)
Physical Therapy Evaluation Patient Details Name: Kendra Clay MRN: [id number] DOB: [birth year] Today's Date: 04/25/2020   History of Present Illness  60 y.o. female ESRD on HD MWF, chronic combined/systolic heart failure, COPD, former tobacco use-who sustained a mechanical fall-subsequently was found to have left pubic rami fracture. WBAT.  Clinical Impression  Pt reports she walked back to her car after falling and sustaining the pubic ramus fracture, very motivated to get moving.  Pt walked on room air for a short trip in the room, noted desaturation with gait but not below 89%.  Pt is returned to cannula at her request and maintained sats in high 90's thereafter.  Pt is mildly weak on L hip from injury, but with RW compensates well. Plan is to work on mobility, to address O2 needs as her sats indicate and follow along to recover gait and loss of strength on LLE hip. Pt is ready to work, will work toward going home.     Follow Up Recommendations Home health PT;Supervision for mobility/OOB    Equipment Recommendations  None recommended by PT    Recommendations for Other Services       Precautions / Restrictions Precautions Precautions: Fall Precaution Comments: WBAT on L LE Restrictions Weight Bearing Restrictions: Yes Other Position/Activity Restrictions: WBAT      Mobility  Bed Mobility Overal bed mobility: Needs Assistance Bed Mobility: Supine to Sit;Sit to Supine     Supine to sit: Min assist Sit to supine: Min assist   General bed mobility comments: assistance with LE's to manage discomfort    Transfers Overall transfer level: Needs assistance Equipment used: Rolling walker (2 wheeled) Transfers: Sit to/from Stand Sit to Stand: Min guard         General transfer comment: physical prompts for use of walker and push off on bed  Ambulation/Gait Ambulation/Gait assistance: Min guard Gait Distance (Feet): 16 Feet Assistive device: Rolling walker (2  wheeled);1 person hand held assist Gait Pattern/deviations: Step-to pattern;Step-through pattern;Decreased stride length;Trunk flexed;Narrow base of support Gait velocity: reduced Gait velocity interpretation: <1.31 ft/sec, indicative of household ambulator General Gait Details: pt is walking in mildly guarded posture to protect her injury, also off O2 and noted drop to 89% on room air  Stairs            Wheelchair Mobility    Modified Rankin (Stroke Patients Only)       Balance Overall balance assessment: Needs assistance;History of Falls   Sitting balance-Leahy Scale: Good       Standing balance-Leahy Scale: Poor Standing balance comment: reliant on use of RW                             Pertinent Vitals/Pain Pain Assessment: Faces Faces Pain Scale: Hurts even more Pain Location: LLE with WB activiites Pain Descriptors / Indicators: Grimacing;Guarding Pain Intervention(s): Monitored during session;Premedicated before session;Repositioned    Home Living Family/patient expects to be discharged to:: Private residence Living Arrangements: Children Available Help at Discharge: Family;Available 24 hours/day Type of Home: House Home Access: Stairs to enter Entrance Stairs-Rails: None Entrance Stairs-Number of Steps: 2 Home Layout: One level Home Equipment: Walker - 2 wheels;Cane - single point;Shower seat      Prior Function Level of Independence: Independent         Comments: has HD appointments     Hand Dominance   Dominant Hand: Right    Extremity/Trunk Assessment   Upper Extremity Assessment  Upper Extremity Assessment: Defer to OT evaluation    Lower Extremity Assessment Lower Extremity Assessment: LLE deficits/detail LLE Deficits / Details: mild strength changes on hip LLE Coordination: decreased gross motor    Cervical / Trunk Assessment Cervical / Trunk Assessment: Kyphotic (mild)  Communication   Communication: No  difficulties  Cognition Arousal/Alertness: Awake/alert Behavior During Therapy: WFL for tasks assessed/performed Overall Cognitive Status: Within Functional Limits for tasks assessed                                        General Comments General comments (skin integrity, edema, etc.): O2 dropped to 89% with gait on room air but recovered to 99% with 1L O2    Exercises     Assessment/Plan    PT Assessment Patient needs continued PT services  PT Problem List Decreased strength;Decreased range of motion;Decreased activity tolerance;Decreased balance;Decreased mobility;Decreased coordination;Decreased knowledge of use of DME;Cardiopulmonary status limiting activity;Pain       PT Treatment Interventions Gait training;DME instruction;Stair training;Functional mobility training;Therapeutic activities;Therapeutic exercise;Balance training;Neuromuscular re-education;Patient/family education    PT Goals (Current goals can be found in the Care Plan section)  Acute Rehab PT Goals Patient Stated Goal: to go home and be independent PT Goal Formulation: With patient Time For Goal Achievement: 05/02/20 Potential to Achieve Goals: Good    Frequency Min 3X/week   Barriers to discharge Inaccessible home environment;Decreased caregiver support will need to work on gait and stairs as Covid restrictions permit    Co-evaluation               AM-PAC PT "6 Clicks" Mobility  Outcome Measure Help needed turning from your back to your side while in a flat bed without using bedrails?: None Help needed moving from lying on your back to sitting on the side of a flat bed without using bedrails?: A Little Help needed moving to and from a bed to a chair (including a wheelchair)?: A Little Help needed standing up from a chair using your arms (e.g., wheelchair or bedside chair)?: A Little Help needed to walk in hospital room?: A Little Help needed climbing 3-5 steps with a railing? : A  Lot 6 Click Score: 18    End of Session Equipment Utilized During Treatment: Gait belt;Oxygen Activity Tolerance: Patient tolerated treatment well;Patient limited by fatigue;Patient limited by pain Patient left: in bed;with call bell/phone within reach Nurse Communication: Mobility status PT Visit Diagnosis: Unsteadiness on feet (R26.81);Pain Pain - Right/Left: Left Pain - part of body: Hip    Time: [id number] PT Time Calculation (min) (ACUTE ONLY): 28 min   Charges:   PT Evaluation $PT Eval Moderate Complexity: 1 Mod PT Treatments $Gait Training: 8-22 mins       Ramond Dial 04/25/2020, 3:55 PM Mee Hives, PT MS Acute Rehab Dept. Number: [place] [facility] [place]

## 2020-04-25 NOTE — TOC CAGE-AID Note (Signed)
Transition of Care [facility]) - CAGE-AID Screening   Patient Details  Name: Kendra Clay MRN: [id number] Date of Birth: Feb 13, 1960   Elvina Sidle, RN Trauma Response Nurse  04/25/2020, 5:18 PM   Clinical Narrative:   Pt denied any alcohol or drug use on admission assessment  CAGE-AID Screening:    Have You Ever Felt You Ought to Cut Down on Your Drinking or Drug Use?: No Have People Annoyed You By Critizing Your Drinking Or Drug Use?: No Have You Felt Bad Or Guilty About Your Drinking Or Drug Use?: No Have You Ever Had a Drink or Used Drugs First Thing In The Morning to Steady Your Nerves or to Get Rid of a Hangover?: No CAGE-AID Score: 0  Substance Abuse Education Offered: No (Pt denies drug or alcohol use)

## 2020-04-25 NOTE — ED Notes (Addendum)
Pt c/o of , " not being able to catch breath". Per MD Ghmire, pt taken off 02 and not to be placed on 02 unless desat to upper 80's. Pt RR WKL and 02 in lower 90's.

## 2020-04-25 NOTE — ED Notes (Signed)
Report given to rn on 5 w 

## 2020-04-25 NOTE — Progress Notes (Signed)
PROGRESS NOTE        PATIENT DETAILS Name: Kendra Clay Age: 60 y.o. Sex: female Date of Birth: 04/25/1960 Admit Date: 04/24/2020 Admitting Physician Shela Leff, MD [id number], [name]  Brief Narrative: Patient is a 60 y.o. female ESRD on HD MWF, chronic combined/systolic heart failure, COPD, former tobacco use-who sustained a mechanical fall-subsequently was found to have left pubic rami fracture.  Significant events: 3/22>> fall-left pubic rami fracture  Significant studies: 3/22>> x-ray left femur: No fracture 3/22>> x-ray lumbar spine: No acute bony abnormality 3/22>> CT pelvis without contrast: Acute fracture left pubic rami, polycystic kidney disease 3/23>> CT chest: Cardiomegaly with findings consistent with CHF.  Antimicrobial therapy: None  Microbiology data: 3/22>> Covid PCR: Positive  Procedures : None  Consults: Nephrology  DVT Prophylaxis : heparin injection 5,000 Units Start: 04/25/20 0200   Subjective: Stable pain in the left hip area.  Requiring 2 L of oxygen to maintain O2 saturations above 90%.   Assessment/Plan: Left pubic rami fracture: Following a mechanical fall-await PT/OT-supportive care.  Acute hypoxic respiratory failure: Requiring 2 L of oxygen to maintain O2 saturations above 90%-suspect could have underlying pulmonary edema due to chronic systolic heart failure in the setting of hemodialysis.  Has history of COPD that may be playing a role as well.  Doubt this is from COVID pneumonia.  Attempt to titrate off oxygen/reassess post HD  HFrEF with exacerbation: Diuresis with HD-continue Coreg/Losartan/Imdur  COPD: No flare-continue bronchodilators as needed  HLD:c/w statin  ESRD: HD MWF-nephrology consulted  GERD:continue PPI  Covid 19 infection: Asymptomatic-3 days of Remdesivir plan.  Diet: Diet Order            Diet renal with fluid restriction Fluid restriction: 1200 mL Fluid; Room  service appropriate? Yes; Fluid consistency: Thin  Diet effective now                  Code Status: Full code   Family Communication: Son-[phone]-called on 3/23-unable to leave voicemail.  Disposition Plan: Status is: Moving to inpatient  Moving to inpatient  Dispo: The patient is from: Home              Anticipated d/c is to: TBD              Patient currently is not medically stable to d/c.   Difficult to place patient No   Barriers to Discharge: Hypoxia-on O2 supplementation-Covid positive-on Remdesivir  Antimicrobial agents: Anti-infectives (From admission, onward)   Start     Dose/Rate Route Frequency Ordered Stop   04/26/20 1000  remdesivir 100 mg in sodium chloride 0.9 % 100 mL IVPB       "Followed by" Linked Group Details   100 mg 200 mL/hr over 30 Minutes Intravenous Daily 04/25/20 0221 04/28/20 0959   04/25/20 0430  remdesivir 100 mg in sodium chloride 0.9 % 100 mL IVPB  Status:  Discontinued       "Followed by" Linked Group Details   100 mg 200 mL/hr over 30 Minutes Intravenous  Once 04/25/20 0220 04/25/20 0221   04/25/20 0400  remdesivir 100 mg in sodium chloride 0.9 % 100 mL IVPB  Status:  Discontinued       "Followed by" Linked Group Details   100 mg 200 mL/hr over 30 Minutes Intravenous  Once 04/25/20 0220 04/25/20 0221   04/25/20 0400  remdesivir 200 mg in sodium chloride 0.9% 250 mL IVPB       "Followed by" Linked Group Details   200 mg 580 mL/hr over 30 Minutes Intravenous Once 04/25/20 0221 04/25/20 0420       Time spent: 25- minutes-Greater than 50% of this time was spent in counseling, explanation of diagnosis, planning of further management, and coordination of care.  MEDICATIONS: Scheduled Meds: . atorvastatin  40 mg Oral QHS  . carvedilol  12.5 mg Oral BID WC  . ferric citrate  210 mg Oral TID WC  . heparin  5,000 Units Subcutaneous Q8H  . isosorbide mononitrate  30 mg Oral QHS  . lidocaine  1 patch Transdermal Q24H  .  losartan  25 mg Oral Daily  . pantoprazole  40 mg Oral BID   Continuous Infusions: . [START ON 04/26/2020] remdesivir 100 mg in NS 100 mL     PRN Meds:.acetaminophen, albuterol, hydrALAZINE   PHYSICAL EXAM: Vital signs: Vitals:   04/25/20 0400 04/25/20 0500 04/25/20 0800 04/25/20 1100  BP: (!) 167/79 (!) 155/73 (!) 153/69 (!) 174/87  Pulse: 83 67 72 71  Resp: '16 20 13 20  '$ Temp:      TempSrc:      SpO2: 93% 96% 90% 98%   There were no vitals filed for this visit. There is no height or weight on file to calculate BMI.   Gen Exam:Alert awake-not in any distress HEENT:atraumatic, normocephalic Chest: B/L clear to auscultation anteriorly CVS:S1S2 regular Abdomen:soft non tender, non distended Extremities:no edema Neurology: Non focal Skin: no rash  I have personally reviewed following labs and imaging studies  LABORATORY DATA: CBC: Recent Labs  Lab 04/24/20 1830  WBC 4.8  NEUTROABS 3.6  HGB 9.5*  HCT 30.1*  MCV 98.0  PLT 116*    Basic Metabolic Panel: Recent Labs  Lab 04/24/20 1830 04/25/20 0410  NA 129* 131*  K 3.1* 3.3*  CL 92* 93*  CO2 29 28  GLUCOSE 99 87  BUN 17 21*  CREATININE 6.93* 7.88*  CALCIUM 8.8* 8.9    GFR: CrCl cannot be calculated (Unknown ideal weight.).  Liver Function Tests: Recent Labs  Lab 04/25/20 0410  AST 19  ALT 11  ALKPHOS 99  BILITOT 1.1  PROT 6.0*  ALBUMIN 2.6*   No results for input(s): LIPASE, AMYLASE in the last 168 hours. No results for input(s): AMMONIA in the last 168 hours.  Coagulation Profile: No results for input(s): INR, PROTIME in the last 168 hours.  Cardiac Enzymes: No results for input(s): CKTOTAL, CKMB, CKMBINDEX, TROPONINI in the last 168 hours.  BNP (last 3 results) No results for input(s): PROBNP in the last 8760 hours.  Lipid Profile: No results for input(s): CHOL, HDL, LDLCALC, TRIG, CHOLHDL, LDLDIRECT in the last 72 hours.  Thyroid Function Tests: No results for input(s): TSH,  T4TOTAL, FREET4, T3FREE, THYROIDAB in the last 72 hours.  Anemia Panel: Recent Labs    04/25/20 0409  FERRITIN 1,214*    Urine analysis:    Component Value Date/Time   COLORURINE STRAW (A) 08/16/2017 2339   APPEARANCEUR CLEAR 08/16/2017 2339   LABSPEC 1.005 08/16/2017 2339   PHURINE 9.0 (H) 08/16/2017 2339   GLUCOSEU NEGATIVE 08/16/2017 2339   HGBUR SMALL (A) 08/16/2017 2339   BILIRUBINUR NEGATIVE 08/16/2017 2339   BILIRUBINUR neg 06/25/2011 1726   KETONESUR NEGATIVE 08/16/2017 2339   PROTEINUR 30 (A) 08/16/2017 2339   UROBILINOGEN 0.2 06/25/2011 1726   NITRITE NEGATIVE 08/16/2017 2339  LEUKOCYTESUR TRACE (A) 08/16/2017 2339    Sepsis Labs: Lactic Acid, Venous No results found for: LATICACIDVEN  MICROBIOLOGY: Recent Results (from the past 240 hour(s))  Resp Panel by RT-PCR (Flu A&B, Covid) Nasopharyngeal Swab     Status: Abnormal   Collection Time: 04/24/20  9:15 PM   Specimen: Nasopharyngeal Swab; Nasopharyngeal(NP) swabs in vial transport medium  Result Value Ref Range Status   SARS Coronavirus 2 by RT PCR POSITIVE (A) NEGATIVE Final    Comment: RESULT CALLED TO, READ BACK BY AND VERIFIED WITH: Irena Reichmann RN 04/24/20 2350 JDW (NOTE) SARS-CoV-2 target nucleic acids are DETECTED.  The SARS-CoV-2 RNA is generally detectable in upper respiratory specimens during the acute phase of infection. Positive results are indicative of the presence of the identified virus, but do not rule out bacterial infection or co-infection with other pathogens not detected by the test. Clinical correlation with patient history and other diagnostic information is necessary to determine patient infection status. The expected result is Negative.  Fact Sheet for Patients: EntrepreneurPulse.com.au  Fact Sheet for Healthcare Providers: IncredibleEmployment.be  This test is not yet approved or cleared by the [place] FDA and  has been authorized for  detection and/or diagnosis of SARS-CoV-2 by FDA under an Emergency Use Authorization (EUA).  This EUA will remain in effect (meaning this test can be [place] ed) for the duration of  the COVID-19 declaration under Section 564(b)(1) of the Act, 21 U.S.C. section 360bbb-3(b)(1), unless the authorization is terminated or revoked sooner.     Influenza A by PCR NEGATIVE NEGATIVE Final   Influenza B by PCR NEGATIVE NEGATIVE Final    Comment: (NOTE) The Xpert Xpress SARS-CoV-2/FLU/RSV plus assay is intended as an aid in the diagnosis of influenza from Nasopharyngeal swab specimens and should not be used as a sole basis for treatment. Nasal washings and aspirates are unacceptable for Xpert Xpress SARS-CoV-2/FLU/RSV testing.  Fact Sheet for Patients: EntrepreneurPulse.com.au  Fact Sheet for Healthcare Providers: IncredibleEmployment.be  This test is not yet approved or cleared by the [place] FDA and has been authorized for detection and/or diagnosis of SARS-CoV-2 by FDA under an Emergency Use Authorization (EUA). This EUA will remain in effect (meaning this test can be used) for the duration of the COVID-19 declaration under Section 564(b)(1) of the Act, 21 U.S.C. section 360bbb-3(b)(1), unless the authorization is terminated or revoked.  Performed at [facility], [place] [address], [place], [place] [address]     RADIOLOGY STUDIES/RESULTS: DG Lumbar Spine Complete  Result Date: 04/24/2020 CLINICAL DATA:  Fall. EXAM: LUMBAR SPINE - COMPLETE 4+ VIEW COMPARISON:  CT 08/16/2017. FINDINGS: Mild scoliosis concave left. Diffuse osteopenia and degenerative change. No acute bony abnormality. No evidence of fracture. Aortoiliac and visceral atherosclerotic vascular calcification. IMPRESSION: Mild scoliosis concave left. Diffuse osteopenia and degenerative change. No acute bony abnormality identified. Electronically Signed   By: Marcello Moores  Register   On:  04/24/2020 14:48   DG Pelvis 1-2 Views  Result Date: 04/24/2020 CLINICAL DATA:  Pelvic pain after a fall today.  Initial encounter. EXAM: PELVIS - 1-2 VIEW COMPARISON:  None. FINDINGS: There is no evidence of pelvic fracture or diastasis. No pelvic bone lesions are seen. IMPRESSION: Negative exam. Electronically Signed   By: Inge Rise M.D.   On: 04/24/2020 14:49   CT CHEST WO CONTRAST  Result Date: 04/25/2020 CLINICAL DATA:  61 year old female with respiratory failure. EXAM: CT CHEST WITHOUT CONTRAST TECHNIQUE: Multidetector CT imaging of the chest was performed following the  standard protocol without IV contrast. COMPARISON:  Chest CT dated 04/10/2020. Chest radiograph dated 04/24/2020. FINDINGS: Evaluation of this exam is limited in the absence of intravenous contrast. Cardiovascular: Mild cardiomegaly. Small pericardial effusion measuring 5 mm in thickness. Moderate atherosclerotic calcification of the thoracic aorta. There is mild dilatation of the main pulmonary trunk suggestive of pulmonary hypertension. Clinical correlation is recommended. Mediastinum/Nodes: No hilar or mediastinal adenopathy. Evaluation however is limited in the absence of intravenous contrast. Top-normal subcarinal lymph node measures 10 mm in short axis. The esophagus and the thyroid gland are grossly unremarkable. No mediastinal fluid collection. Lungs/Pleura: Trace bilateral pleural effusions. There is diffuse interstitial and interlobular septal prominence consistent with edema. Pneumonia is not excluded. No focal consolidation or pneumothorax. There is background of centrilobular emphysema. Right lung base streaky atelectasis. The central airways are patent. Upper Abdomen: Innumerable hepatic and renal cysts. 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 No acute osseous pathology. Right axillary vascular stent. IMPRESSION: 1. Cardiomegaly with findings of CHF and trace bilateral  pleural effusions. 2. Aortic Atherosclerosis (ICD10-I70.0) and Emphysema (ICD10-J43.9). Electronically Signed   By: Anner Crete M.D.   On: 04/25/2020 02:57   CT PELVIS WO CONTRAST  Result Date: 04/24/2020 CLINICAL DATA:  Fall with pelvic trauma.  Deformity of the left hip. EXAM: CT PELVIS WITHOUT CONTRAST TECHNIQUE: Multidetector CT imaging of the pelvis was performed following the standard protocol without intravenous contrast. COMPARISON:  Pelvis x-rays from earlier the same day. FINDINGS: Urinary Tract: Inferior kidneys are visualized extending down into the upper pelvis showing marked enlargement and cystic change. Imaging features are similar to abdomen pelvis CT of 08/16/2017 consistent with polycystic kidney disease. Bladder is decompressed. Bowel:  Unremarkable. Vascular/Lymphatic: No pelvic sidewall lymphadenopathy. Atherosclerotic calcification noted in the inferior aorta and common iliac arteries bilaterally. Reproductive: The uterus is surgically absent. There is no adnexal mass. Other:  No intraperitoneal free fluid. Musculoskeletal: Acute fracture noted inferior left pubic ramus (axial image 101/series 5). No left superior pubic ramus fracture evident although there is asymmetry of the SI joints. Left SI joint appears wider than the right although this appearance is similar to abdomen/pelvis CT of 08/16/2017 suggesting chronicity. No identifiable superior pubic ramus fracture on the left. No definite sacral fracture evident although assessment is limited by the marked osteopenia. Left femoral head is located. No discernible fracture in the left femoral neck. Soft tissue windows show no evidence for hematoma or hemorrhage in the soft tissues around the left hip. IMPRESSION: 1. Acute fracture of the inferior left pubic ramus. 2. No associated fracture of the left superior pubic ramus. No femoral neck fracture evident. Asymmetry of the SI joints is similar to prior study from 3 years ago. MRI  pelvis could be used to further evaluate as clinically warranted. 3. No evidence for hemorrhage or hematoma in the soft tissues around the left hip. 4. Polycystic kidney disease. 5. Aortic Atherosclerosis (ICD10-I70.0). Electronically Signed   By: Misty Stanley M.D.   On: 04/24/2020 16:34   DG Chest Port 1 View  Result Date: 04/24/2020 CLINICAL DATA:  Hypoxia EXAM: PORTABLE CHEST 1 VIEW COMPARISON:  12/23/2017 FINDINGS: Mild hyperinflation. Mild peribronchial opacities. No pleural effusion or pneumothorax. No focal consolidation. IMPRESSION: Mild hyperinflation and peribronchial opacities, which may indicate mild pulmonary edema superimposed on COPD. Electronically Signed   By: Ulyses Jarred M.D.   On: 04/24/2020 20:13   DG Femur Min 2 Views Left  Result Date: 04/24/2020 CLINICAL DATA:  Left upper leg pain  after a fall today. Initial encounter. EXAM: LEFT FEMUR 2 VIEWS COMPARISON:  None. FINDINGS: There is no evidence of fracture or other focal bone lesions. Soft tissues are unremarkable. IMPRESSION: Negative exam. Electronically Signed   By: Inge Rise M.D.   On: 04/24/2020 14:48     LOS: 0 days   Oren Binet, MD  [facility]    To contact the attending provider between 7A-7P or the covering provider during after hours 7P-7A, please log into the web site [email or web address] and access using universal [place] password for that web site. If you do not have the password, please call the hospital operator.  04/25/2020, 11:28 AM

## 2020-04-25 NOTE — Plan of Care (Signed)
  Problem: Education: Goal: Knowledge of risk factors and measures for prevention of condition will improve Outcome: Progressing   

## 2020-04-26 ENCOUNTER — Other Ambulatory Visit: Payer: Self-pay | Admitting: Internal Medicine

## 2020-04-26 DIAGNOSIS — E785 Hyperlipidemia, unspecified: Secondary | ICD-10-CM

## 2020-04-26 LAB — RENAL FUNCTION PANEL
Albumin: 2.4 g/dL — ABNORMAL LOW (ref 3.5–5.0)
Anion gap: 13 (ref 5–15)
BUN: 39 mg/dL — ABNORMAL HIGH (ref 6–20)
CO2: 23 mmol/L (ref 22–32)
Calcium: 8.7 mg/dL — ABNORMAL LOW (ref 8.9–10.3)
Chloride: 96 mmol/L — ABNORMAL LOW (ref 98–111)
Creatinine, Ser: 10.5 mg/dL — ABNORMAL HIGH (ref 0.44–1.00)
GFR, Estimated: 4 mL/min — ABNORMAL LOW (ref >60)
Glucose, Bld: 84 mg/dL (ref 70–99)
Phosphorus: 4.3 mg/dL (ref 2.5–4.6)
Potassium: 4.4 mmol/L (ref 3.5–5.1)
Sodium: 132 mmol/L — ABNORMAL LOW (ref 135–145)

## 2020-04-26 LAB — CBC
HCT: 32.9 % — ABNORMAL LOW (ref 36.0–46.0)
Hemoglobin: 10.5 g/dL — ABNORMAL LOW (ref 12.0–15.0)
MCH: 30.2 pg (ref 26.0–34.0)
MCHC: 31.9 g/dL (ref 30.0–36.0)
MCV: 94.5 fL (ref 80.0–100.0)
Platelets: 121 10*3/uL — ABNORMAL LOW (ref 150–400)
RBC: 3.48 MIL/uL — ABNORMAL LOW (ref 3.87–5.11)
RDW: 15.8 % — ABNORMAL HIGH (ref 11.5–15.5)
WBC: 5.4 10*3/uL (ref 4.0–10.5)
nRBC: 0 % (ref 0.0–0.2)

## 2020-04-26 MED ORDER — LIDOCAINE 5 % EX PTCH
1.0000 | MEDICATED_PATCH | Freq: Once | CUTANEOUS | Status: DC
  Filled 2020-04-26: qty 1

## 2020-04-26 MED ORDER — LIDOCAINE 5 % EX PTCH: 1.0000 | MEDICATED_PATCH | CUTANEOUS | 0 refills | Status: DC

## 2020-04-26 MED ORDER — POTASSIUM CHLORIDE CRYS ER 20 MEQ PO TBCR
40.0000 meq | EXTENDED_RELEASE_TABLET | Freq: Once | ORAL | Status: AC
  Administered 2020-04-26: 40 meq via ORAL
  Filled 2020-04-26: qty 2

## 2020-04-26 NOTE — Progress Notes (Signed)
[facility] Progress Note  Subjective: Patient not seen directly today given COVID-19 + status, utilizing data taken from chart +/- discussions w/ providers and staff.    Vitals:   04/25/20 1944 04/26/20 0000 04/26/20 0338 04/26/20 0726  BP: (!) 167/80 (!) 168/79 (!) 173/81 (!) 174/83  Pulse: 79 71 79 75  Resp: '17 19 20 19  '$ Temp: 98.3 F (36.8 C) 97.6 F (36.4 C) 98.1 F (36.7 C) 98.4 F (36.9 C)  TempSrc: Oral Oral Oral Oral  SpO2: 93% 98% 92% 93%  Weight:      Height:        Exam:  Patient not seen directly today given COVID-19 + status, utilizing data taken from chart +/- discussions w/ providers and staff.       Home meds:  - coreg 12.5 bid/ cozaar 25 qd/ imdur 30 hs/ lipitor 40 hs  - auryxia 1 ac tid/ protonix 40 bid/ prn albuterol hfa  - prn's/ vitamins/ supplements   CT chest - IMPRESSION: 1. Cardiomegaly with findings of CHF and trace bilateral pleural effusions. 2. Aortic Atherosclerosis (ICD10-I70.0) and Emphysema (ICD10-J43.9).    Na 131 K 3.3 BUN 21  Cr 7.8    OP HD: [facility] MWF  3h 22mn  2/2 bath  58.5kg  Hep none  RFA AVF  - hect 2 ug tiw  - mircera 150 q2 last 3/16        Assessment/ Plan: 1. Fall / acute L pelvic bone fracture - admitted. May be going home today.  2. ESRD - on HD MWF. Getting HD today off schedule for missed HD yest. She should be finished w/ HD by 5-6 pm this evening. Will return to usual HD tomorrow/ Friday.  3. Vol / HTN - cont 2 home BP lowering meds. At dry wt. Has appearance of CHF by chest CT and by CXR, although CXR changes appear to be chronic to some extent. Did have low SpO2 in ED. Is on RA now. Plan large UF w/ HD today >>  if BP tolerates her edw can be lowered.  4. COVID -19 infectino - per primary team.  5. HTN urgency - cont home meds, get vol down w/ HD 6. Anemia ckd - Hb 9.5, last esa on 3/16, next due on 3/30.  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 atorvastatin  40 mg Oral QHS  . carvedilol  12.5 mg Oral BID WC  . Chlorhexidine Gluconate Cloth  6 each Topical Q0600  . ferric citrate  210 mg Oral TID WC  . heparin  5,000 Units Subcutaneous Q8H  . isosorbide mononitrate  30 mg Oral QHS  . lidocaine  1 patch Transdermal Q24H  . losartan  25 mg Oral Daily  . pantoprazole  40 mg Oral BID   . remdesivir 100 mg in NS 100 mL Stopped (04/26/20 0923)   acetaminophen, albuterol, hydrALAZINE

## 2020-04-26 NOTE — Progress Notes (Signed)
Some pain at the left hip area-but able to work with therapy.  She is not on oxygen today.  She will get dialysis later this afternoon.  She is very anxious to be discharged-since she is able to ambulate with a rolling walker-should be able to go home after dialysis.  I reviewed CT images with orthopedics-Dr. Nicki Reaper Dean-continue current care-does not need surgery.

## 2020-04-26 NOTE — Progress Notes (Signed)
Patient discharge and went home with her brother. Patient is alert and oriented x3. Given all belongings to the patient. Iv removed.

## 2020-04-26 NOTE — Discharge Summary (Signed)
PATIENT DETAILS Name: Kendra Clay Age: 60 y.o. Sex: female Date of Birth: 1960/05/08 MRN: [id number]. Admitting Physician: Jonetta Osgood, MD QP:[id number], [name]  Admit Date: 04/24/2020 Discharge date: 04/26/2020  Recommendations for Outpatient Follow-up:  1. Follow up with PCP in 1-2 weeks 2. Please obtain CMP/CBC in one week  Admitted From:  Home  Disposition: Home with home health [place]: Yes  Equipment/Devices: None  Discharge Condition: Stable  CODE STATUS: FULL CODE  Diet recommendation:  Diet Order            Diet - low sodium heart healthy           Diet renal with fluid restriction Fluid restriction: 1200 mL Fluid; Room service appropriate? Yes; Fluid consistency: Thin  Diet effective now                  Brief Narrative: Patient is a 60 y.o. female ESRD on HD MWF, chronic combined/systolic heart failure, COPD, former tobacco use-who sustained a mechanical fall-subsequently was found to have left pubic rami fracture.  Significant events: 3/22>> fall-left pubic rami fracture  Significant studies: 3/22>> x-ray left femur: No fracture 3/22>> x-ray lumbar spine: No acute bony abnormality 3/22>> CT pelvis without contrast: Acute fracture left pubic rami, polycystic kidney disease 3/23>> CT chest: Cardiomegaly with findings consistent with CHF.  Antimicrobial therapy: None  Microbiology data: 3/22>> Covid PCR: Positive  Procedures : None  Consults: Nephrology  Brief Hospital Course: Left pubic rami fracture: Following a mechanical fall-some pain but able to ambulate with physical therapy.  Patient claims that pain is well controlled with just Tylenol and Lidoderm patch-she does not want narcotics..  CT pelvis was reviewed by orthopedics MD-Dr. Nicki Reaper Dean-suspect that patient will continue to have pain for approximately 3 weeks more-we will need outpatient follow-up with repeat imaging.  No further  recommendations from orthopedics.  Acute hypoxic respiratory failure:  Hypoxia is improved overnight-on room air this morning.  Suspect due to underlying COPD/pulmonary edema in the setting of chronic systolic heart failure and ESRD.    HFrEF with exacerbation: Diuresis with HD-continue Coreg/Losartan/Imdur  COPD: No flare-continue bronchodilators as needed  HLD:c/w statin  ESRD: HD MWF-nephrology consulted  GERD:continue PPI  Covid 19 infection: Asymptomatic-plan was for 3 days of Remdesivir-however she is anxious to go home.  Should be okay for discharge given she is completely asymptomatic.   COVID-19 Labs:  Recent Labs    04/25/20 0409  DDIMER 3.74*  FERRITIN 1,214*  LDH 174  CRP 0.8    Lab Results  Component Value Date   SARSCOV2NAA POSITIVE (A) 04/24/2020     Discharge Diagnoses:  Principal Problem:   Pubic ramus fracture ([place]) Active Problems:   COVID-19 virus infection   Hypertensive urgency   COPD (chronic obstructive pulmonary disease) (HCC)   HLD (hyperlipidemia)   Pelvic fracture [facility])   Discharge Instructions:    Person Under Monitoring Name: Kendra Clay  Location: [place] [address]   Infection Prevention Recommendations for Individuals Confirmed to have, or Being Evaluated for, 2019 Novel Coronavirus (COVID-19) Infection Who Receive Care at Home  Individuals who are confirmed to have, or are being evaluated for, COVID-19 should follow the prevention steps below until a healthcare provider or local or state health department says they can return to normal activities.  Stay home except to get medical care You should restrict activities outside your home, except for getting medical care. Do not  go to work, school, or public areas, and do not use public transportation or taxis.  Call ahead before visiting your doctor Before your medical appointment, call the healthcare provider and tell them that you have, or  are being evaluated for, COVID-19 infection. This will help the healthcare provider's office take steps to keep other people from getting infected. Ask your healthcare provider to call the local or state health department.  Monitor your symptoms Seek prompt medical attention if your illness is worsening (e.g., difficulty breathing). Before going to your medical appointment, call the healthcare provider and tell them that you have, or are being evaluated for, COVID-19 infection. Ask your healthcare provider to call the local or state health department.  Wear a facemask You should wear a facemask that covers your nose and mouth when you are in the same room with other people and when you visit a healthcare provider. People who live with or visit you should also wear a facemask while they are in the same room with you.  Separate yourself from other people in your home As much as possible, you should stay in a different room from other people in your home. Also, you should use a separate bathroom, if available.  Avoid sharing household items You should not share dishes, drinking glasses, cups, eating utensils, towels, bedding, or other items with other people in your home. After using these items, you should wash them thoroughly with soap and water.  Cover your coughs and sneezes Cover your mouth and nose with a tissue when you cough or sneeze, or you can cough or sneeze into your sleeve. Throw used tissues in a lined trash can, and immediately wash your hands with soap and water for at least 20 seconds or use an alcohol-based hand rub.  Wash your [facility] your hands often and thoroughly with soap and water for at least 20 seconds. You can use an alcohol-based hand sanitizer if soap and water are not available and if your hands are not visibly dirty. Avoid touching your eyes, nose, and mouth with unwashed hands.   Prevention Steps for Caregivers and Household Members of Individuals  Confirmed to have, or Being Evaluated for, COVID-19 Infection Being Cared for in the Home  If you live with, or provide care at home for, a person confirmed to have, or being evaluated for, COVID-19 infection please follow these guidelines to prevent infection:  Follow healthcare provider's instructions Make sure that you understand and can help the patient follow any healthcare provider instructions for all care.  Provide for the patient's basic needs You should help the patient with basic needs in the home and provide support for getting groceries, prescriptions, and other personal needs.  Monitor the patient's symptoms If they are getting sicker, call his or her medical provider and tell them that the patient has, or is being evaluated for, COVID-19 infection. This will help the healthcare provider's office take steps to keep other people from getting infected. Ask the healthcare provider to call the local or state health department.  Limit the number of people who have contact with the patient  If possible, have only one caregiver for the patient.  Other household members should stay in another home or place of residence. If this is not possible, they should stay  in another room, or be separated from the patient as much as possible. Use a separate bathroom, if available.  Restrict visitors who do not have an essential need to be in the  home.  Keep older adults, very young children, and other sick people away from the patient Keep older adults, very young children, and those who have compromised immune systems or chronic health conditions away from the patient. This includes people with chronic heart, lung, or kidney conditions, diabetes, and cancer.  Ensure good ventilation Make sure that shared spaces in the home have good air flow, such as from an air conditioner or an opened window, weather permitting.  Wash your hands often  Wash your hands often and thoroughly with soap  and water for at least 20 seconds. You can use an alcohol based hand sanitizer if soap and water are not available and if your hands are not visibly dirty.  Avoid touching your eyes, nose, and mouth with unwashed hands.  Use disposable paper towels to dry your hands. If not available, use dedicated cloth towels and replace them when they become wet.  Wear a facemask and gloves  Wear a disposable facemask at all times in the room and gloves when you touch or have contact with the patient's blood, body fluids, and/or secretions or excretions, such as sweat, saliva, sputum, nasal mucus, vomit, urine, or feces.  Ensure the mask fits over your nose and mouth tightly, and do not touch it during use.  Throw out disposable facemasks and gloves after using them. Do not reuse.  Wash your hands immediately after removing your facemask and gloves.  If your personal clothing becomes contaminated, carefully remove clothing and launder. Wash your hands after handling contaminated clothing.  Place all used disposable facemasks, gloves, and other waste in a lined container before disposing them with other household waste.  Remove gloves and wash your hands immediately after handling these items.  Do not share dishes, glasses, or other household items with the patient  Avoid sharing household items. You should not share dishes, drinking glasses, cups, eating utensils, towels, bedding, or other items with a patient who is confirmed to have, or being evaluated for, COVID-19 infection.  After the person uses these items, you should wash them thoroughly with soap and water.  Wash laundry thoroughly  Immediately remove and wash clothes or bedding that have blood, body fluids, and/or secretions or excretions, such as sweat, saliva, sputum, nasal mucus, vomit, urine, or feces, on them.  Wear gloves when handling laundry from the patient.  Read and follow directions on labels of laundry or clothing items and  detergent. In general, wash and dry with the warmest temperatures recommended on the label.  Clean all areas the individual has used often  Clean all touchable surfaces, such as counters, tabletops, doorknobs, bathroom fixtures, toilets, phones, keyboards, tablets, and bedside tables, every day. Also, clean any surfaces that may have blood, body fluids, and/or secretions or excretions on them.  Wear gloves when cleaning surfaces the patient has come in contact with.  Use a diluted bleach solution (e.g., dilute bleach with 1 part bleach and 10 parts water) or a household disinfectant with a label that says EPA-registered for coronaviruses. To make a bleach solution at home, add 1 tablespoon of bleach to 1 quart (4 cups) of water. For a larger supply, add  cup of bleach to 1 gallon (16 cups) of water.  Read labels of cleaning products and follow recommendations provided on product labels. Labels contain instructions for safe and effective use of the cleaning product including precautions you should take when applying the product, such as wearing gloves or eye protection and making sure you have  good ventilation during use of the product.  Remove gloves and wash hands immediately after cleaning.  Monitor yourself for signs and symptoms of illness Caregivers and household members are considered close contacts, should monitor their health, and will be asked to limit movement outside of the home to the extent possible. Follow the monitoring steps for close contacts listed on the symptom monitoring form.   ? If you have additional questions, contact your local health department or call the epidemiologist on call at [phone] (available 24/7). ? This guidance is subject to change. For the most up-to-date guidance from [facility], please refer to their website: YouBlogs.pl    Activity:  As tolerated with Full fall precautions use  walker/cane & assistance as needed   Discharge Instructions    Call MD for:  severe uncontrolled pain   Complete by: As directed    Diet - low sodium heart healthy   Complete by: As directed    Discharge instructions   Complete by: As directed    Follow with Primary MD  Patient, [name] in 1-2 weeks  Please get a complete blood count and chemistry panel checked by your Primary MD at your next visit, and again as instructed by your Primary MD.  Get Medicines reviewed and adjusted: Please take all your medications with you for your next visit with your Primary MD  Laboratory/radiological data: Please request your Primary MD to go over all hospital tests and procedure/radiological results at the follow up, please ask your Primary MD to get all Hospital records sent to his/her office.  In some cases, they will be blood work, cultures and biopsy results pending at the time of your discharge. Please request that your primary care M.D. follows up on these results.  Also Note the following: If you experience worsening of your admission symptoms, develop shortness of breath, life threatening emergency, suicidal or homicidal thoughts you must seek medical attention immediately by calling 911 or calling your MD immediately  if symptoms less severe.  You must read complete instructions/literature along with all the possible adverse reactions/side effects for all the Medicines you take and that have been prescribed to you. Take any new Medicines after you have completely understood and accpet all the possible adverse reactions/side effects.   Do not drive when taking Pain medications or sleeping medications (Benzodaizepines)  Do not take more than prescribed Pain, Sleep and Anxiety Medications. It is not advisable to combine anxiety,sleep and pain medications without talking with your primary care practitioner  Special Instructions: If you have smoked or chewed Tobacco  in the last 2 yrs please  stop smoking, stop any regular Alcohol  and or any Recreational drug use.  Wear Seat belts while driving.  Please note: You were cared for by a hospitalist during your hospital stay. Once you are discharged, your primary care physician will handle any further medical issues. Please note that NO REFILLS for any discharge medications will be authorized once you are discharged, as it is imperative that you return to your primary care physician (or establish a relationship with a primary care physician if you do not have one) for your post hospital discharge needs so that they can reassess your need for medications and monitor your lab values.   Increase activity slowly   Complete by: As directed      Allergies as of 04/26/2020      Reactions   E-mycin [erythromycin] Hives   Sulfa Antibiotics Rash   Other reaction(s): Skin  Rash Other reaction(s): Skin Rash   Lisinopril Cough      Medication List    TAKE these medications   acetaminophen 500 MG tablet Commonly known as: TYLENOL Take 2 tablets (1,000 mg total) by mouth every 8 (eight) hours. What changed:   when to take this  reasons to take this   albuterol 108 (90 Base) MCG/ACT inhaler Commonly known as: VENTOLIN HFA Inhale 2 puffs into the lungs every 6 (six) hours as needed. What changed: reasons to take this   atorvastatin 40 MG tablet Commonly known as: LIPITOR Take 40 mg by mouth at bedtime.   carvedilol 12.5 MG tablet Commonly known as: COREG Take 12.5 mg by mouth 2 (two) times daily.   DIALYVITE 800 WITH ZINC 0.8 MG Tabs Take 1 tablet by mouth at bedtime.   estradiol 0.1 MG/GM vaginal cream Commonly known as: ESTRACE Place 1 Applicatorful vaginally at bedtime as needed (yeast infection).   ferric citrate 1 GM 210 MG(Fe) tablet Commonly known as: AURYXIA Take 210 mg by mouth 3 (three) times daily with meals.   isosorbide mononitrate 30 MG 24 hr tablet Commonly known as: IMDUR Take 30 mg by mouth at bedtime.    lidocaine 5 % Commonly known as: LIDODERM Place 1 patch onto the skin daily. Remove & Discard patch within 12 hours or as directed by MD   loratadine 10 MG tablet Commonly known as: CLARITIN Take 10 mg by mouth daily as needed for allergies.   losartan 25 MG tablet Commonly known as: COZAAR Take 25 mg by mouth daily.   nitroGLYCERIN 0.4 MG SL tablet Commonly known as: NITROSTAT Place 0.4 mg under the tongue every 5 (five) minutes as needed for chest pain.   pantoprazole 40 MG tablet Commonly known as: PROTONIX Take 40 mg by mouth 2 (two) times daily.   polyethylene glycol powder 17 GM/SCOOP powder Commonly known as: MiraLax Start taking 1 capful 3 times a day. Slowly cut back as needed until you have normal bowel movements. What changed:   how much to take  how to take this  when to take this  reasons to take this  additional instructions       Follow-up Information    Care, [facility] Follow up.   Specialty: [place] Why: A representaive from [facility] will contact you to arrange start date and time for your therapy. Contact information: [place] [address] [phone]        Meredith Pel, MD. Schedule an appointment as soon as possible for a visit in 2 week(s).   Specialty: Orthopedic Surgery Contact information: [place] [address] [phone]        Primary care MD. Schedule an appointment as soon as possible for a visit in 1 week(s).        Hemodialysis center Follow up.   Why: Follow at your usual schedule.             Allergies  Allergen Reactions  . E-Mycin [Erythromycin] Hives  . Sulfa Antibiotics Rash    Other reaction(s): Skin Rash Other reaction(s): Skin Rash   . Lisinopril Cough     Other Procedures/Studies: DG Lumbar Spine Complete  Result Date: 04/24/2020 CLINICAL DATA:  Fall. EXAM: LUMBAR SPINE - COMPLETE 4+ VIEW COMPARISON:  CT  08/16/2017. FINDINGS: Mild scoliosis concave left. Diffuse osteopenia and degenerative change. No acute bony abnormality. No evidence of fracture. Aortoiliac and visceral atherosclerotic vascular  calcification. IMPRESSION: Mild scoliosis concave left. Diffuse osteopenia and degenerative change. No acute bony abnormality identified. Electronically Signed   By: Marcello Moores  Register   On: 04/24/2020 14:48   DG Pelvis 1-2 Views  Result Date: 04/24/2020 CLINICAL DATA:  Pelvic pain after a fall today.  Initial encounter. EXAM: PELVIS - 1-2 VIEW COMPARISON:  None. FINDINGS: There is no evidence of pelvic fracture or diastasis. No pelvic bone lesions are seen. IMPRESSION: Negative exam. Electronically Signed   By: Inge Rise M.D.   On: 04/24/2020 14:49   CT CHEST WO CONTRAST  Result Date: 04/25/2020 CLINICAL DATA:  60 year old female with respiratory failure. EXAM: CT CHEST WITHOUT CONTRAST TECHNIQUE: Multidetector CT imaging of the chest was performed following the standard protocol without IV contrast. COMPARISON:  Chest CT dated 04/10/2020. Chest radiograph dated 04/24/2020. FINDINGS: Evaluation of this exam is limited in the absence of intravenous contrast. Cardiovascular: Mild cardiomegaly. Small pericardial effusion measuring 5 mm in thickness. Moderate atherosclerotic calcification of the thoracic aorta. There is mild dilatation of the main pulmonary trunk suggestive of pulmonary hypertension. Clinical correlation is recommended. Mediastinum/Nodes: No hilar or mediastinal adenopathy. Evaluation however is limited in the absence of intravenous contrast. Top-normal subcarinal lymph node measures 10 mm in short axis. The esophagus and the thyroid gland are grossly unremarkable. No mediastinal fluid collection. Lungs/Pleura: Trace bilateral pleural effusions. There is diffuse interstitial and interlobular septal prominence consistent with edema. Pneumonia is not excluded. No focal consolidation or pneumothorax.  There is background of centrilobular emphysema. Right lung base streaky atelectasis. The central airways are patent. Upper Abdomen: Innumerable hepatic and renal cysts. 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 No acute osseous pathology. Right axillary vascular stent. IMPRESSION: 1. Cardiomegaly with findings of CHF and trace bilateral pleural effusions. 2. Aortic Atherosclerosis (ICD10-I70.0) and Emphysema (ICD10-J43.9). Electronically Signed   By: Anner Crete M.D.   On: 04/25/2020 02:57   CT PELVIS WO CONTRAST  Result Date: 04/24/2020 CLINICAL DATA:  Fall with pelvic trauma.  Deformity of the left hip. EXAM: CT PELVIS WITHOUT CONTRAST TECHNIQUE: Multidetector CT imaging of the pelvis was performed following the standard protocol without intravenous contrast. COMPARISON:  Pelvis x-rays from earlier the same day. FINDINGS: Urinary Tract: Inferior kidneys are visualized extending down into the upper pelvis showing marked enlargement and cystic change. Imaging features are similar to abdomen pelvis CT of 08/16/2017 consistent with polycystic kidney disease. Bladder is decompressed. Bowel:  Unremarkable. Vascular/Lymphatic: No pelvic sidewall lymphadenopathy. Atherosclerotic calcification noted in the inferior aorta and common iliac arteries bilaterally. Reproductive: The uterus is surgically absent. There is no adnexal mass. Other:  No intraperitoneal free fluid. Musculoskeletal: Acute fracture noted inferior left pubic ramus (axial image 101/series 5). No left superior pubic ramus fracture evident although there is asymmetry of the SI joints. Left SI joint appears wider than the right although this appearance is similar to abdomen/pelvis CT of 08/16/2017 suggesting chronicity. No identifiable superior pubic ramus fracture on the left. No definite sacral fracture evident although assessment is limited by the marked osteopenia. Left femoral head is located. No  discernible fracture in the left femoral neck. Soft tissue windows show no evidence for hematoma or hemorrhage in the soft tissues around the left hip. IMPRESSION: 1. Acute fracture of the inferior left pubic ramus. 2. No associated fracture of the left superior pubic ramus. No femoral neck fracture evident. Asymmetry of the SI joints is similar to prior study from 3 years ago. MRI pelvis could be used to further  evaluate as clinically warranted. 3. No evidence for hemorrhage or hematoma in the soft tissues around the left hip. 4. Polycystic kidney disease. 5. Aortic Atherosclerosis (ICD10-I70.0). Electronically Signed   By: Misty Stanley M.D.   On: 04/24/2020 16:34   DG Chest Port 1 View  Result Date: 04/24/2020 CLINICAL DATA:  Hypoxia EXAM: PORTABLE CHEST 1 VIEW COMPARISON:  12/23/2017 FINDINGS: Mild hyperinflation. Mild peribronchial opacities. No pleural effusion or pneumothorax. No focal consolidation. IMPRESSION: Mild hyperinflation and peribronchial opacities, which may indicate mild pulmonary edema superimposed on COPD. Electronically Signed   By: Ulyses Jarred M.D.   On: 04/24/2020 20:13   CT CHEST LUNG CA SCREEN LOW DOSE W/O CM  Result Date: 04/11/2020 CLINICAL DATA:  60 year old female former smoker (quit 6 years ago) with 76 pack-year history of smoking. Lung cancer screening examination. EXAM: CT CHEST WITHOUT CONTRAST LOW-DOSE FOR LUNG CANCER SCREENING TECHNIQUE: Multidetector CT imaging of the chest was performed following the standard protocol without IV contrast. COMPARISON:  No priors. FINDINGS: Cardiovascular: Heart size is mildly enlarged. There is no significant pericardial fluid, thickening or pericardial calcification. There is aortic atherosclerosis, as well as atherosclerosis of the great vessels of the mediastinum and the coronary arteries, including calcified atherosclerotic plaque in the left main, left anterior descending and left circumflex coronary arteries. Mediastinum/Nodes:  No pathologically enlarged mediastinal or hilar lymph nodes. Please note that accurate exclusion of hilar adenopathy is limited on noncontrast CT scans. Esophagus is unremarkable in appearance. No axillary lymphadenopathy. Lungs/Pleura: Several small pulmonary nodules are noted in the lungs, largest of which is in the inferior aspect of the right upper lobe abutting the minor fissure (axial image 144 of series 3), with a volume derived mean diameter of 3.8 mm. No other larger more suspicious appearing pulmonary nodules or masses are noted. No acute consolidative airspace disease. No pleural effusions. Diffuse bronchial wall thickening with moderate centrilobular and mild paraseptal emphysema. 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 Aortic atherosclerosis. Musculoskeletal: There are no aggressive appearing lytic or blastic lesions noted in the visualized portions of the skeleton. IMPRESSION: 1. Lung-RADS 2S, benign appearance or behavior. Continue annual screening with low-dose chest CT without contrast in 12 months. 2. The "S" modifier above refers to potentially clinically significant non lung cancer related findings. Specifically, there is aortic atherosclerosis, in addition to left main and 2 vessel coronary artery disease. Please note that although the presence of coronary artery calcium documents the presence of coronary artery disease, the severity of this disease and any potential stenosis cannot be assessed on this non-gated CT examination. Assessment for potential risk factor modification, dietary therapy or pharmacologic therapy may be warranted, if clinically indicated. 3. Mild diffuse bronchial wall thickening with moderate centrilobular and mild paraseptal emphysema; imaging findings suggestive of underlying  COPD. 4. 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 Aortic Atherosclerosis (ICD10-I70.0) and Emphysema (ICD10-J43.9). Electronically Signed   By: Vinnie Langton M.D.   On: 04/11/2020 07:52   DG Femur Min 2 Views Left  Result Date: 04/24/2020 CLINICAL DATA:  Left upper leg pain after a fall today. Initial encounter. EXAM: LEFT FEMUR 2 VIEWS COMPARISON:  None. FINDINGS: There is no evidence of fracture or other focal bone lesions. Soft tissues are unremarkable. IMPRESSION: Negative exam. Electronically Signed   By: Inge Rise M.D.   On:  04/24/2020 14:48     TODAY-DAY OF DISCHARGE:  Subjective:   Kendra Clay today has no headache,no chest abdominal pain,no new weakness tingling or numbness, feels much better wants to go home today.   Objective:   Blood pressure (!) 185/88, pulse 77, temperature 98.5 F (36.9 C), temperature source Oral, resp. rate 20, height '5\' 4"'$  (1.626 m), weight 59 kg, SpO2 94 %.  Intake/Output Summary (Last 24 hours) at 04/26/2020 1138 Last data filed at 04/26/2020 0530 Gross per 24 hour  Intake 120 ml  Output 2 ml  Net 118 ml   Filed Weights   04/25/20 1748  Weight: 59 kg    Exam: Awake Alert, Oriented *3, No new F.N deficits, Normal affect [place].AT,PERRAL Supple Neck,No JVD, No cervical lymphadenopathy appriciated.  Symmetrical Chest wall movement, Good air movement bilaterally, CTAB RRR,No Gallops,Rubs or new Murmurs, No Parasternal Heave +ve B.Sounds, Abd Soft, Non tender, No organomegaly appriciated, No rebound -guarding or rigidity. No Cyanosis, Clubbing or edema, No new Rash or bruise   PERTINENT RADIOLOGIC STUDIES: DG Lumbar Spine Complete  Result Date: 04/24/2020 CLINICAL DATA:  Fall. EXAM: LUMBAR SPINE - COMPLETE 4+ VIEW COMPARISON:  CT 08/16/2017. FINDINGS: Mild scoliosis concave left. Diffuse osteopenia and degenerative change. No acute bony abnormality. No evidence of  fracture. Aortoiliac and visceral atherosclerotic vascular calcification. IMPRESSION: Mild scoliosis concave left. Diffuse osteopenia and degenerative change. No acute bony abnormality identified. Electronically Signed   By: Marcello Moores  Register   On: 04/24/2020 14:48   DG Pelvis 1-2 Views  Result Date: 04/24/2020 CLINICAL DATA:  Pelvic pain after a fall today.  Initial encounter. EXAM: PELVIS - 1-2 VIEW COMPARISON:  None. FINDINGS: There is no evidence of pelvic fracture or diastasis. No pelvic bone lesions are seen. IMPRESSION: Negative exam. Electronically Signed   By: Inge Rise M.D.   On: 04/24/2020 14:49   CT CHEST WO CONTRAST  Result Date: 04/25/2020 CLINICAL DATA:  60 year old female with respiratory failure. EXAM: CT CHEST WITHOUT CONTRAST TECHNIQUE: Multidetector CT imaging of the chest was performed following the standard protocol without IV contrast. COMPARISON:  Chest CT dated 04/10/2020. Chest radiograph dated 04/24/2020. FINDINGS: Evaluation of this exam is limited in the absence of intravenous contrast. Cardiovascular: Mild cardiomegaly. Small pericardial effusion measuring 5 mm in thickness. Moderate atherosclerotic calcification of the thoracic aorta. There is mild dilatation of the main pulmonary trunk suggestive of pulmonary hypertension. Clinical correlation is recommended. Mediastinum/Nodes: No hilar or mediastinal adenopathy. Evaluation however is limited in the absence of intravenous contrast. Top-normal subcarinal lymph node measures 10 mm in short axis. The esophagus and the thyroid gland are grossly unremarkable. No mediastinal fluid collection. Lungs/Pleura: Trace bilateral pleural effusions. There is diffuse interstitial and interlobular septal prominence consistent with edema. Pneumonia is not excluded. No focal consolidation or pneumothorax. There is background of centrilobular emphysema. Right lung base streaky atelectasis. The central airways are patent. Upper Abdomen:  Innumerable hepatic and renal cysts. 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 No acute osseous pathology. Right axillary vascular stent. IMPRESSION: 1. Cardiomegaly with findings of CHF and trace bilateral pleural effusions. 2. Aortic Atherosclerosis (ICD10-I70.0) and Emphysema (ICD10-J43.9). Electronically Signed   By: Anner Crete M.D.   On: 04/25/2020 02:57   CT PELVIS WO CONTRAST  Result Date: 04/24/2020 CLINICAL DATA:  Fall with pelvic trauma.  Deformity of the left hip. EXAM: CT PELVIS WITHOUT CONTRAST TECHNIQUE: Multidetector CT imaging of the pelvis was performed following the standard protocol without intravenous contrast. COMPARISON:  Pelvis  x-rays from earlier the same day. FINDINGS: Urinary Tract: Inferior kidneys are visualized extending down into the upper pelvis showing marked enlargement and cystic change. Imaging features are similar to abdomen pelvis CT of 08/16/2017 consistent with polycystic kidney disease. Bladder is decompressed. Bowel:  Unremarkable. Vascular/Lymphatic: No pelvic sidewall lymphadenopathy. Atherosclerotic calcification noted in the inferior aorta and common iliac arteries bilaterally. Reproductive: The uterus is surgically absent. There is no adnexal mass. Other:  No intraperitoneal free fluid. Musculoskeletal: Acute fracture noted inferior left pubic ramus (axial image 101/series 5). No left superior pubic ramus fracture evident although there is asymmetry of the SI joints. Left SI joint appears wider than the right although this appearance is similar to abdomen/pelvis CT of 08/16/2017 suggesting chronicity. No identifiable superior pubic ramus fracture on the left. No definite sacral fracture evident although assessment is limited by the marked osteopenia. Left femoral head is located. No discernible fracture in the left femoral neck. Soft tissue windows show no evidence for hematoma or hemorrhage in the soft tissues around the  left hip. IMPRESSION: 1. Acute fracture of the inferior left pubic ramus. 2. No associated fracture of the left superior pubic ramus. No femoral neck fracture evident. Asymmetry of the SI joints is similar to prior study from 3 years ago. MRI pelvis could be used to further evaluate as clinically warranted. 3. No evidence for hemorrhage or hematoma in the soft tissues around the left hip. 4. Polycystic kidney disease. 5. Aortic Atherosclerosis (ICD10-I70.0). Electronically Signed   By: Misty Stanley M.D.   On: 04/24/2020 16:34   DG Chest Port 1 View  Result Date: 04/24/2020 CLINICAL DATA:  Hypoxia EXAM: PORTABLE CHEST 1 VIEW COMPARISON:  12/23/2017 FINDINGS: Mild hyperinflation. Mild peribronchial opacities. No pleural effusion or pneumothorax. No focal consolidation. IMPRESSION: Mild hyperinflation and peribronchial opacities, which may indicate mild pulmonary edema superimposed on COPD. Electronically Signed   By: Ulyses Jarred M.D.   On: 04/24/2020 20:13   CT CHEST LUNG CA SCREEN LOW DOSE W/O CM  Result Date: 04/11/2020 CLINICAL DATA:  60 year old female former smoker (quit 6 years ago) with 76 pack-year history of smoking. Lung cancer screening examination. EXAM: CT CHEST WITHOUT CONTRAST LOW-DOSE FOR LUNG CANCER SCREENING TECHNIQUE: Multidetector CT imaging of the chest was performed following the standard protocol without IV contrast. COMPARISON:  No priors. FINDINGS: Cardiovascular: Heart size is mildly enlarged. There is no significant pericardial fluid, thickening or pericardial calcification. There is aortic atherosclerosis, as well as atherosclerosis of the great vessels of the mediastinum and the coronary arteries, including calcified atherosclerotic plaque in the left main, left anterior descending and left circumflex coronary arteries. Mediastinum/Nodes: No pathologically enlarged mediastinal or hilar lymph nodes. Please note that accurate exclusion of hilar adenopathy is limited on noncontrast  CT scans. Esophagus is unremarkable in appearance. No axillary lymphadenopathy. Lungs/Pleura: Several small pulmonary nodules are noted in the lungs, largest of which is in the inferior aspect of the right upper lobe abutting the minor fissure (axial image 144 of series 3), with a volume derived mean diameter of 3.8 mm. No other larger more suspicious appearing pulmonary nodules or masses are noted. No acute consolidative airspace disease. No pleural effusions. Diffuse bronchial wall thickening with moderate centrilobular and mild paraseptal emphysema. 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 Aortic atherosclerosis. Musculoskeletal: There are no aggressive appearing lytic or blastic lesions noted in the  visualized portions of the skeleton. IMPRESSION: 1. Lung-RADS 2S, benign appearance or behavior. Continue annual screening with low-dose chest CT without contrast in 12 months. 2. The "S" modifier above refers to potentially clinically significant non lung cancer related findings. Specifically, there is aortic atherosclerosis, in addition to left main and 2 vessel coronary artery disease. Please note that although the presence of coronary artery calcium documents the presence of coronary artery disease, the severity of this disease and any potential stenosis cannot be assessed on this non-gated CT examination. Assessment for potential risk factor modification, dietary therapy or pharmacologic therapy may be warranted, if clinically indicated. 3. Mild diffuse bronchial wall thickening with moderate centrilobular and mild paraseptal emphysema; imaging findings suggestive of underlying COPD. 4. 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 Aortic Atherosclerosis  (ICD10-I70.0) and Emphysema (ICD10-J43.9). Electronically Signed   By: Vinnie Langton M.D.   On: 04/11/2020 07:52   DG Femur Min 2 Views Left  Result Date: 04/24/2020 CLINICAL DATA:  Left upper leg pain after a fall today. Initial encounter. EXAM: LEFT FEMUR 2 VIEWS COMPARISON:  None. FINDINGS: There is no evidence of fracture or other focal bone lesions. Soft tissues are unremarkable. IMPRESSION: Negative exam. Electronically Signed   By: Inge Rise M.D.   On: 04/24/2020 14:48     PERTINENT LAB RESULTS: CBC: Recent Labs    04/24/20 1830  WBC 4.8  HGB 9.5*  HCT 30.1*  PLT 116*   CMET CMP     Component Value Date/Time   NA 131 (L) 04/25/2020 0410   K 3.3 (L) 04/25/2020 0410   CL 93 (L) 04/25/2020 0410   CO2 28 04/25/2020 0410   GLUCOSE 87 04/25/2020 0410   BUN 21 (H) 04/25/2020 0410   CREATININE 7.88 (H) 04/25/2020 0410   CREATININE 3.30 (H) 06/25/2011 1716   CALCIUM 8.9 04/25/2020 0410   PROT 6.0 (L) 04/25/2020 0410   ALBUMIN 2.6 (L) 04/25/2020 0410   AST 19 04/25/2020 0410   ALT 11 04/25/2020 0410   ALKPHOS 99 04/25/2020 0410   BILITOT 1.1 04/25/2020 0410   GFRNONAA 5 (L) 04/25/2020 0410   GFRAA 8 (L) 05/20/2018 0627    GFR Estimated Creatinine Clearance: 6.6 mL/min (A) (by C-G formula based on SCr of 7.88 mg/dL (H)). No results for input(s): LIPASE, AMYLASE in the last 72 hours. No results for input(s): CKTOTAL, CKMB, CKMBINDEX, TROPONINI in the last 72 hours. Invalid input(s): POCBNP Recent Labs    04/25/20 0409  DDIMER 3.74*   No results for input(s): HGBA1C in the last 72 hours. No results for input(s): CHOL, HDL, LDLCALC, TRIG, CHOLHDL, LDLDIRECT in the last 72 hours. No results for input(s): TSH, T4TOTAL, T3FREE, THYROIDAB in the last 72 hours.  Invalid input(s): FREET3 Recent Labs    04/25/20 0409  FERRITIN 1,214*   Coags: No results for input(s): INR in the last 72 hours.  Invalid input(s): PT Microbiology: Recent Results (from the  past 240 hour(s))  Resp Panel by RT-PCR (Flu A&B, Covid) Nasopharyngeal Swab     Status: Abnormal   Collection Time: 04/24/20  9:15 PM   Specimen: Nasopharyngeal Swab; Nasopharyngeal(NP) swabs in vial transport medium  Result Value Ref Range Status   SARS Coronavirus 2 by RT PCR POSITIVE (A) NEGATIVE Final    Comment: RESULT CALLED TO, READ BACK BY AND VERIFIED WITH: Irena Reichmann RN 04/24/20 2350 JDW (NOTE) SARS-CoV-2 target nucleic acids are DETECTED.  The SARS-CoV-2 RNA is  generally detectable in upper respiratory specimens during the acute phase of infection. Positive results are indicative of the presence of the identified virus, but do not rule out bacterial infection or co-infection with other pathogens not detected by the test. Clinical correlation with patient history and other diagnostic information is necessary to determine patient infection status. The expected result is Negative.  Fact Sheet for Patients: EntrepreneurPulse.com.au  Fact Sheet for Healthcare Providers: IncredibleEmployment.be  This test is not yet approved or cleared by the [place] FDA and  has been authorized for detection and/or diagnosis of SARS-CoV-2 by FDA under an Emergency Use Authorization (EUA).  This EUA will remain in effect (meaning this test can be [place] ed) for the duration of  the COVID-19 declaration under Section 564(b)(1) of the Act, 21 U.S.C. section 360bbb-3(b)(1), unless the authorization is terminated or revoked sooner.     Influenza A by PCR NEGATIVE NEGATIVE Final   Influenza B by PCR NEGATIVE NEGATIVE Final    Comment: (NOTE) The Xpert Xpress SARS-CoV-2/FLU/RSV plus assay is intended as an aid in the diagnosis of influenza from Nasopharyngeal swab specimens and should not be used as a sole basis for treatment. Nasal washings and aspirates are unacceptable for Xpert Xpress SARS-CoV-2/FLU/RSV testing.  Fact Sheet for  Patients: EntrepreneurPulse.com.au  Fact Sheet for Healthcare Providers: IncredibleEmployment.be  This test is not yet approved or cleared by the [place] FDA and has been authorized for detection and/or diagnosis of SARS-CoV-2 by FDA under an Emergency Use Authorization (EUA). This EUA will remain in effect (meaning this test can be used) for the duration of the COVID-19 declaration under Section 564(b)(1) of the Act, 21 U.S.C. section 360bbb-3(b)(1), unless the authorization is terminated or revoked.  Performed at [facility], [place] [address], [place], [place] [address]     FURTHER DISCHARGE INSTRUCTIONS:  Get Medicines reviewed and adjusted: Please take all your medications with you for your next visit with your Primary MD  Laboratory/radiological data: Please request your Primary MD to go over all hospital tests and procedure/radiological results at the follow up, please ask your Primary MD to get all Hospital records sent to his/her office.  In some cases, they will be blood work, cultures and biopsy results pending at the time of your discharge. Please request that your primary care M.D. goes through all the records of your hospital data and follows up on these results.  Also Note the following: If you experience worsening of your admission symptoms, develop shortness of breath, life threatening emergency, suicidal or homicidal thoughts you must seek medical attention immediately by calling 911 or calling your MD immediately  if symptoms less severe.  You must read complete instructions/literature along with all the possible adverse reactions/side effects for all the Medicines you take and that have been prescribed to you. Take any new Medicines after you have completely understood and accpet all the possible adverse reactions/side effects.   Do not drive when taking Pain medications or sleeping medications (Benzodaizepines)  Do  not take more than prescribed Pain, Sleep and Anxiety Medications. It is not advisable to combine anxiety,sleep and pain medications without talking with your primary care practitioner  Special Instructions: If you have smoked or chewed Tobacco  in the last 2 yrs please stop smoking, stop any regular Alcohol  and or any Recreational drug use.  Wear Seat belts while driving.  Please note: You were cared for by a hospitalist during your hospital stay. Once you are discharged, your  primary care physician will handle any further medical issues. Please note that NO REFILLS for any discharge medications will be authorized once you are discharged, as it is imperative that you return to your primary care physician (or establish a relationship with a primary care physician if you do not have one) for your post hospital discharge needs so that they can reassess your need for medications and monitor your lab values.  Total Time spent coordinating discharge including counseling, education and face to face time equals 35 minutes.  SignedOren Binet 04/26/2020 11:38 AM

## 2020-04-26 NOTE — Progress Notes (Signed)
Physical Therapy Treatment Patient Details Name: Kendra Clay MRN: [id number] DOB: 15-Dec-1960 Today's Date: 04/26/2020    History of Present Illness 60 y.o. female ESRD on HD MWF, chronic combined/systolic heart failure, COPD, former tobacco use-who sustained a mechanical fall-subsequently was found to have left pubic rami fracture. WBAT.    PT Comments    Pt is progressing well towards goals and showing good carry over learning with RW. She ambulated short distance in room today followed by seated LE exercises. Assist required for exercises secondary to LE fatigue and pain. Will continue to follow acutely for mobility progression.     Follow Up Recommendations  Home health PT;Supervision for mobility/OOB     Equipment Recommendations  None recommended by PT    Recommendations for Other Services       Precautions / Restrictions Precautions Precautions: Fall Precaution Comments: WBAT on L LE Restrictions Other Position/Activity Restrictions: WBAT    Mobility  Bed Mobility Overal bed mobility: Modified Independent;Needs Assistance Bed Mobility: Supine to Sit     Supine to sit: Supervision     General bed mobility comments: Pt using R LE to progress LLE off EOB. No cues or assist required.    Transfers Overall transfer level: Needs assistance Equipment used: Rolling walker (2 wheeled) Transfers: Sit to/from Stand Sit to Stand: Supervision         General transfer comment: Pt able to verbalize hand placement w/o cueing. Demonstrates good carry over learning from previous session.  Ambulation/Gait Ambulation/Gait assistance: Min guard Gait Distance (Feet): 25 Feet Assistive device: Rolling walker (2 wheeled) Gait Pattern/deviations: Step-to pattern;Step-through pattern;Decreased stride length;Trunk flexed;Narrow base of support Gait velocity: reduced   General Gait Details: Pt able to place more weight through LLE today. Mildly antalgic gait, but overall  steady with RW.   Stairs             Wheelchair Mobility    Modified Rankin (Stroke Patients Only)       Balance Overall balance assessment: Needs assistance;History of Falls   Sitting balance-Leahy Scale: Good       Standing balance-Leahy Scale: Poor Standing balance comment: reliant on use of RW                            Cognition Arousal/Alertness: Awake/alert Behavior During Therapy: WFL for tasks assessed/performed Overall Cognitive Status: Within Functional Limits for tasks assessed                                        Exercises General Exercises - Lower Extremity Ankle Circles/Pumps: AROM;Both;10 reps;Seated Heel Slides: AROM;Left;5 reps;Seated Hip ABduction/ADduction: AAROM;Left;10 reps;Seated Straight Leg Raises: AAROM;Left;10 reps;Seated    General Comments General comments (skin integrity, edema, etc.): on RA during session      Pertinent Vitals/Pain Pain Assessment: Faces Faces Pain Scale: Hurts little more Pain Location: LLE with movement and WB activiites Pain Descriptors / Indicators: Grimacing;Guarding Pain Intervention(s): Monitored during session;Limited activity within patient's tolerance;Repositioned    Home Living                      Prior Function            PT Goals (current goals can now be found in the care plan section) Acute Rehab PT Goals Patient Stated Goal: to go home and be independent PT Goal  Formulation: With patient Time For Goal Achievement: 05/02/20 Potential to Achieve Goals: Good Progress towards PT goals: Progressing toward goals    Frequency    Min 3X/week      PT Plan Current plan remains appropriate    Co-evaluation              AM-PAC PT "6 Clicks" Mobility   Outcome Measure  Help needed turning from your back to your side while in a flat bed without using bedrails?: None Help needed moving from lying on your back to sitting on the side of a flat  bed without using bedrails?: A Little Help needed moving to and from a bed to a chair (including a wheelchair)?: A Little Help needed standing up from a chair using your arms (e.g., wheelchair or bedside chair)?: A Little Help needed to walk in hospital room?: A Little Help needed climbing 3-5 steps with a railing? : A Lot 6 Click Score: 18    End of Session Equipment Utilized During Treatment: Gait belt Activity Tolerance: Patient tolerated treatment well Patient left: with call bell/phone within reach;in chair;with nursing/sitter in room Nurse Communication: Mobility status PT Visit Diagnosis: Unsteadiness on feet (R26.81);Pain Pain - Right/Left: Left Pain - part of body: Hip     Time: 1201-1220 PT Time Calculation (min) (ACUTE ONLY): 19 min  Charges:  $Gait Training: 8-22 mins                     Benjiman Core, [place] Pager [phone] Acute Rehab    Allena Katz 04/26/2020, 1:21 PM

## 2020-04-26 NOTE — Progress Notes (Addendum)
Patient being DC today Order sent to [facility] for Lidoderm 5% patch Apparently not filled because not covered under medicaid.  MD and [facility] pharmacist had discussion regarding alternative therapy - no orders given. I was unaware of this and  TOC is closed so I  re-sent prescription to [facility] - where patient normally fills prescriptions.   Also gave patient alternative if RX cost too much Lidoderm 4% patch is available OTC    Bonnita Nasuti Pharm.D. CPP, BCPS Clinical Pharmacist [phone] 04/26/2020 7:20 PM

## 2020-04-26 NOTE — Progress Notes (Addendum)
Renal Navigator spoke with patient to inform her of her outpatient HD seat time since testing positive for COVID on 04/24/20. She is unhappy that she will need to treat at 1:00pm for two weeks and states she likes her 6:00am time. Navigator informed her that she will be able to return to her 6:00am time on 05/09/20. She questions why she needs to isolate since she is vaccinated and boosted. Navigator explained that these things help in lessening symptoms, but do not prevent someone from spreading the virus to another person. Navigator apologized for the inconvenience and told her that she can speak with her clinic manager if she has further questions. Patient states she has her own transportation to dialysis. Navigator notes a plan for discharge today and will notify outpatient HD clinic/[place] that patient should return to tx tomorrow at 1:00pm.  Alphonzo Cruise, [place] Renal Navigator [phone]

## 2020-04-26 NOTE — TOC Initial Note (Signed)
Transition of Care [facility]) - Initial/Assessment Note    Patient Details  Name: Kendra Clay MRN: [id number] Date of Birth: 1960/03/28  Transition of Care [facility]) CM/SW Contact:    Ninfa Meeker, RN Phone Number: 04/26/2020, 9:38 AM  Clinical Narrative:  Patient is a 60 y.o. female ESRD on HD MWF, chronic combined/systolic heart failure, COPD, former tobacco use-who sustained a mechanical fall-subsequently was found to have left pubic rami fracture. WBAT.  Case Manager spoke with Angela concerning recommendation for Home Health therapy. Choice for agencies discussed. Referral called to Adela Lank, [facility] Liaison. Patient states she lives with her son, has RW, it doesn't close completely but it works. CM explained that Medicare will not cover a new one since she received walker 2 yrs ago, has to be greater than 5 yrs or its private pay. Patient states she understands,and she has access to another walker should hers not work correctly. TOC Team will continue to monitor.     Expected Discharge Plan: [place] Barriers to Discharge: Continued Medical Work up   Patient Goals and CMS Choice Patient states their goals for this hospitalization and ongoing recovery are:: go home CMS Medicare.gov Compare Post Acute Care list provided to:: Patient Choice offered to / list presented to : Patient  Expected Discharge Plan and Services Expected Discharge Plan: [place] In-house Referral: NA Discharge Planning Services: CM Consult Post Acute Care Choice: [place] arrangements for the past 2 months: Single Family Home                 DME Arranged: N/A DME Agency: NA       HH Arranged: PT HH Agency: [facility] Date [facility] Agency Contacted: 04/26/20 Time HH Agency Contacted: [phone] Representative spoke with at [place]: Adela Lank  Prior Living Arrangements/Services Living arrangements for the past 2 months: Single Family  Home Lives with:: Adult Children Patient language and need for interpreter reviewed:: Yes Do you feel safe going back to the place where you live?: Yes      Need for Family Participation in Patient Care: Yes (Comment) Care giver support system in place?: Yes (comment)   Criminal Activity/Legal Involvement Pertinent to Current Situation/Hospitalization: No - Comment as needed  Activities of Daily Living Home Assistive Devices/Equipment: None ADL Screening (condition at time of admission) Patient's cognitive ability adequate to safely complete daily activities?: Yes Is the patient deaf or have difficulty hearing?: No Does the patient have difficulty seeing, even when wearing glasses/contacts?: No Does the patient have difficulty concentrating, remembering, or making decisions?: No Patient able to express need for assistance with ADLs?: Yes Does the patient have difficulty dressing or bathing?: No Independently performs ADLs?: Yes (appropriate for developmental age) Does the patient have difficulty walking or climbing stairs?: No Weakness of Legs: None Weakness of Arms/Hands: None  Permission Sought/Granted Permission sought to share information with : Case Manager       Permission granted to share info w AGENCY: [facility]        Emotional Assessment   Attitude/Demeanor/Rapport: Gracious   Orientation: : Oriented to Self,Oriented to Place,Oriented to  Time,Oriented to Situation Alcohol / Substance Use: Not Applicable Psych Involvement: No (comment)  Admission diagnosis:  Pelvic fracture ([place]) [S32.9XXA] Hypoxia [R09.02] ESRD (end stage renal disease) ([place]) [N18.6] Pubic ramus fracture ([place]) [S32.599A] Fall, initial encounter [W19.XXXA] Closed fracture of left inferior pubic ramus, initial encounter [facility]) [S32.592A] Patient Active Problem List  Diagnosis Date Noted  . COVID-19 virus infection 04/25/2020  . Hypertensive urgency 04/25/2020  . COPD (chronic obstructive  pulmonary disease) ([place]) 04/25/2020  . HLD (hyperlipidemia) 04/25/2020  . Pelvic fracture ([place]) 04/25/2020  . Pubic ramus fracture ([place]) 04/24/2020  . History of anemia due to CKD 05/19/2018  . Chronic HFrEF (heart failure with reduced ejection fraction) ([place]) 05/19/2018  . Tibia/fibula fracture, left, closed, initial encounter 05/18/2018  . SOB (shortness of breath) 08/14/2015  . Acute respiratory failure with hypoxia ([place]) 08/14/2015  . Normocytic normochromic anemia 08/14/2015  . Polycystic kidney disease 07/17/2013  . End stage renal disease ([place]) 08/26/2011   PCP:  Patient, [name] Pharmacy:   [facility] [place], [address] [place] [address] Phone: [phone] Fax: [phone]  [facility] [place], [place] [address] [place] [address] Phone: [phone] Fax: [phone]     Social Determinants of Health (SDOH) Interventions    Readmission Risk Interventions No flowsheet data found.

## 2020-04-26 NOTE — Plan of Care (Signed)
  Problem: Education: Goal: Knowledge of risk factors and measures for prevention of condition will improve Outcome: Adequate for Discharge   

## 2020-05-10 ENCOUNTER — Other Ambulatory Visit: Payer: Self-pay

## 2020-05-10 ENCOUNTER — Ambulatory Visit (INDEPENDENT_AMBULATORY_CARE_PROVIDER_SITE_OTHER): Payer: Medicare Other | Admitting: Orthopedic Surgery

## 2020-05-10 ENCOUNTER — Ambulatory Visit (INDEPENDENT_AMBULATORY_CARE_PROVIDER_SITE_OTHER): Payer: Medicare Other

## 2020-05-10 ENCOUNTER — Encounter: Payer: Self-pay | Admitting: Orthopedic Surgery

## 2020-05-10 DIAGNOSIS — S32592A Other specified fracture of left pubis, initial encounter for closed fracture: Secondary | ICD-10-CM

## 2020-05-11 NOTE — Progress Notes (Signed)
Office Visit Note   Patient: Kendra Clay           Date of Birth: 1960-06-24           MRN: [id number] Visit Date: 05/10/2020 Requested by: No referring provider defined for this encounter. PCP: Patient, [name] (Inactive)  Subjective: Chief Complaint  Patient presents with  . Other    F/u pubic rami fx DOI 04/24/20    HPI: Kendra Clay is a 60 year old patient with pubic rami fracture.  Date of injury 04/24/2020.  Having some low back pain as well but mostly left hip pain with movement.  She is able to walk around with a walker.  She is here with her son.  Taking Tylenol and IcyHot.  She was walking in flip-flops and fell backwards.  She is on dialysis.  Denies any other orthopedic complaints.              ROS: All systems reviewed are negative as they relate to the chief complaint within the history of present illness.  Patient denies  fevers or chills.   Assessment & Plan: Visit Diagnoses:  1. Closed fracture of ramus of left pubis, initial encounter ([place])     Plan: Impression is left pubic rami fracture.  She is about 2 weeks out.  Anticipate significant improvement within the next 3 weeks.  Healing may be delayed in her case because of her underlying medical conditions including dialysis.  Continue with weightbearing as tolerated but I advised her not to try to "walk through the pain".  Repeat radiographs in 3 weeks.  Follow-Up Instructions: Return in about 3 weeks (around 05/31/2020).   Orders:  Orders Placed This Encounter  Procedures  . XR Pelvis 1-2 Views   No orders of the defined types were placed in this encounter.     Procedures: No procedures performed   Clinical Data: No additional findings.  Objective: Vital Signs: There were no vitals taken for this visit.  Physical Exam:   Constitutional: Patient appears well-developed HEENT:  Head: Normocephalic Eyes:EOM are normal Neck: Normal range of motion Cardiovascular: Normal rate Pulmonary/chest:  Effort normal Neurologic: Patient is alert Skin: Skin is warm Psychiatric: Patient has normal mood and affect    Ortho Exam: Ortho exam demonstrates no nerve root tension signs.  Good ankle dorsiflexion plantarflexion quad hamstring strength is present.  Does have a little bit of pain with adduction on the left compared to the right.  No pain with hip flexion.  No pain with axial loading of either hip manually.  Pedal pulses palpable in the feet.  Specialty Comments:  No specialty comments available.  Imaging: XR Pelvis 1-2 Views  Result Date: 05/11/2020 AP pelvis radiograph reviewed.  Left pubic rami fracture is present with slight increase in displacement compared to radiographs from the emergency department 04/24/2020.  No hip fractures present.  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  SI joints do not appear to be widened.    PMFS History: Patient Active Problem List   Diagnosis Date Noted  . COVID-19 virus infection 04/25/2020  . Hypertensive urgency 04/25/2020  . COPD (chronic obstructive pulmonary disease) ([place]) 04/25/2020  . HLD (hyperlipidemia) 04/25/2020  . Pelvic fracture ([place]) 04/25/2020  . Pubic ramus fracture ([place]) 04/24/2020  . History of anemia due to CKD 05/19/2018  . Chronic HFrEF (heart failure with reduced ejection fraction) ([place]) 05/19/2018  . Tibia/fibula fracture, left, closed, initial encounter 05/18/2018  . SOB (shortness of breath) 08/14/2015  .  Acute respiratory failure with hypoxia ([place]) 08/14/2015  . Normocytic normochromic anemia 08/14/2015  . Polycystic kidney disease 07/17/2013  . End stage renal disease ([place]) 08/26/2011   Past Medical History:  Diagnosis Date  . Complication of anesthesia   . 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 Heart murmur    "slight" per ? Dr Deatra Ina 30 years ago. 2D ECHO  30 yearsa go.  Marland Kitchen History of blood transfusion    C- Section  . History of bronchitis    numerous, last time> 1 year  . Hypertension   . Night muscle  spasms    legs  . Polycystic kidney disease    Genetic dx 23 years ago  . PONV (postoperative nausea and vomiting)    patch helped  . Scoliosis   . Strabismus     Family History  Problem Relation Age of Onset  . Arthritis Mother   . Kidney disease Father   . Polycystic kidney disease Son   . Asthma Son   . Hypertension Maternal Grandmother     Past Surgical History:  Procedure Laterality Date  . ABDOMINAL HYSTERECTOMY    . AV FISTULA PLACEMENT  09/01/2011   Procedure: ARTERIOVENOUS (AV) FISTULA CREATION;  Surgeon: Rosetta Posner, MD;  Location: [place];  Service: Vascular;  Laterality: Right;  . CESAREAN SECTION  1997  . EYE SURGERY     for lazy eye  . TIBIA IM NAIL INSERTION Left 05/19/2018   Procedure: INTRAMEDULLARY (IM) NAIL TIBIAL;  Surgeon: Hiram Gash, MD;  Location: [place];  Service: Orthopedics;  Laterality: Left;   Social History   Occupational History  . Not on file  Tobacco Use  . Smoking status: Former Smoker    Packs/day: 1.50    Years: 30.00    Pack years: 45.00    Types: Cigarettes    Start date: 1982    Quit date: 01/10/2015    Years since quitting: 5.3  . Smokeless tobacco: Former Systems developer    Quit date: 01/09/2014  Substance and Sexual Activity  . Alcohol use: No    Alcohol/week: 0.0 standard drinks  . Drug use: No  . Sexual activity: Not on file

## 2020-05-21 ENCOUNTER — Ambulatory Visit: Payer: Medicare Other | Admitting: Internal Medicine

## 2020-05-24 ENCOUNTER — Telehealth: Payer: Self-pay | Admitting: Orthopedic Surgery

## 2020-05-24 NOTE — Telephone Encounter (Signed)
Amber with [facility] called wanting to see if we received the CT they sent for the pt? Amber would like a CB with answer, please   Amber# [phone]

## 2020-05-24 NOTE — Telephone Encounter (Signed)
Can you please advise on report under media tab

## 2020-05-25 NOTE — Telephone Encounter (Signed)
She may have to diminish ambulation to let the  fracture heal.  Medical doctor may also need to optimize nutritional status to allow for bone healing.  Including vitamin D and calcium phosphorus etc.

## 2020-05-28 NOTE — Telephone Encounter (Signed)
IC advised.  

## 2020-05-31 ENCOUNTER — Ambulatory Visit (INDEPENDENT_AMBULATORY_CARE_PROVIDER_SITE_OTHER): Payer: Medicare Other

## 2020-05-31 ENCOUNTER — Other Ambulatory Visit: Payer: Self-pay

## 2020-05-31 ENCOUNTER — Encounter: Payer: Self-pay | Admitting: Orthopedic Surgery

## 2020-05-31 ENCOUNTER — Ambulatory Visit (INDEPENDENT_AMBULATORY_CARE_PROVIDER_SITE_OTHER): Payer: Medicare Other | Admitting: Orthopedic Surgery

## 2020-05-31 DIAGNOSIS — S32592A Other specified fracture of left pubis, initial encounter for closed fracture: Secondary | ICD-10-CM | POA: Diagnosis not present

## 2020-05-31 NOTE — Progress Notes (Signed)
Office Visit Note   Patient: Kendra Clay           Date of Birth: Dec 10, 1960           MRN: [id number] Visit Date: 05/31/2020 Requested by: No referring provider defined for this encounter. PCP: Patient, [name] (Inactive)  Subjective: Chief Complaint  Patient presents with  . Left Hip - Follow-up    HPI: Kendra Clay is a 60 year old patient with rami fractures and sacral fracture.  Sacral fractures been diagnosed since last visit.  Overall she is doing better.  She can stand up at this time.  Hard for her to walk but she can stand which is a significant improvement.              ROS: All systems reviewed are negative as they relate to the chief complaint within the history of present illness.  Patient denies  fevers or chills.   Assessment & Plan: Visit Diagnoses:  1. Closed fracture of ramus of left pubis, initial encounter [facility])     Plan: Patient and housing time less than She denied a great examination.  Impression is sacral fractures impression sacral fracture and rami fracture which does show evidence of callus formation.  Patient does have clinical improvement.  No radicular symptoms or weakness.  Plan at this time is for continued progressive weightbearing.  Follow-up in 4 weeks with sacral x-rays at that time.  Follow-Up Instructions: Return in about 4 weeks (around 06/28/2020).   Orders:  Orders Placed This Encounter  Procedures  . XR Pelvis 1-2 Views   No orders of the defined types were placed in this encounter.     Procedures: No procedures performed   Clinical Data: No additional findings.  Objective: Vital Signs: There were no vitals taken for this visit.  Physical Exam:  Constitutional: Patient appears well-developed HEENT:  Head: Normocephalic Eyes:EOM are normal Neck: Normal range of motion Cardiovascular: Normal rate Pulmonary/chest: Effort normal Neurologic: Patient is alert Skin: Skin is warm Psychiatric: Patient has normal mood and  affect    Ortho Exam: Ortho exam demonstrates no groin pain with internal ex rotation of either leg.  Good hip flexion abduction adduction strength.  Good ankle dorsiflexion plantarflexion strength.  No paresthesias L1 S1 bilaterally.  Patient is able to stand without pain.  Specialty Comments:  No specialty comments available.  Imaging: XR Pelvis 1-2 Views  Result Date: 05/31/2020 AP pelvis radiographs reviewed.  Superior rami fracture is present but callus formation is noted.  Compared to radiographs from a month ago with no change in displacement of the fracture.    PMFS History: Patient Active Problem List   Diagnosis Date Noted  . COVID-19 virus infection 04/25/2020  . Hypertensive urgency 04/25/2020  . COPD (chronic obstructive pulmonary disease) ([place]) 04/25/2020  . HLD (hyperlipidemia) 04/25/2020  . Pelvic fracture ([place]) 04/25/2020  . Pubic ramus fracture ([place]) 04/24/2020  . History of anemia due to CKD 05/19/2018  . Chronic HFrEF (heart failure with reduced ejection fraction) ([place]) 05/19/2018  . Tibia/fibula fracture, left, closed, initial encounter 05/18/2018  . SOB (shortness of breath) 08/14/2015  . Acute respiratory failure with hypoxia ([place]) 08/14/2015  . Normocytic normochromic anemia 08/14/2015  . Polycystic kidney disease 07/17/2013  . End stage renal disease ([place]) 08/26/2011   Past Medical History:  Diagnosis Date  . Complication of anesthesia   . 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 Heart murmur    "slight" per ? Dr  Deatra Ina 30 years ago. 2D ECHO  30 yearsa go.  Marland Kitchen History of blood transfusion    C- Section  . History of bronchitis    numerous, last time> 1 year  . Hypertension   . Night muscle spasms    legs  . Polycystic kidney disease    Genetic dx 23 years ago  . PONV (postoperative nausea and vomiting)    patch helped  . Scoliosis   . Strabismus     Family History  Problem Relation Age of Onset  . Arthritis Mother   . Kidney disease  Father   . Polycystic kidney disease Son   . Asthma Son   . Hypertension Maternal Grandmother     Past Surgical History:  Procedure Laterality Date  . ABDOMINAL HYSTERECTOMY    . AV FISTULA PLACEMENT  09/01/2011   Procedure: ARTERIOVENOUS (AV) FISTULA CREATION;  Surgeon: Rosetta Posner, MD;  Location: [place];  Service: Vascular;  Laterality: Right;  . CESAREAN SECTION  1997  . EYE SURGERY     for lazy eye  . TIBIA IM NAIL INSERTION Left 05/19/2018   Procedure: INTRAMEDULLARY (IM) NAIL TIBIAL;  Surgeon: Hiram Gash, MD;  Location: [place];  Service: Orthopedics;  Laterality: Left;   Social History   Occupational History  . Not on file  Tobacco Use  . Smoking status: Former Smoker    Packs/day: 1.50    Years: 30.00    Pack years: 45.00    Types: Cigarettes    Start date: 1982    Quit date: 01/10/2015    Years since quitting: 5.3  . Smokeless tobacco: Former Systems developer    Quit date: 01/09/2014  Substance and Sexual Activity  . Alcohol use: No    Alcohol/week: 0.0 standard drinks  . Drug use: No  . Sexual activity: Not on file

## 2020-06-28 ENCOUNTER — Ambulatory Visit (INDEPENDENT_AMBULATORY_CARE_PROVIDER_SITE_OTHER): Payer: Medicare Other

## 2020-06-28 ENCOUNTER — Encounter: Payer: Self-pay | Admitting: Orthopedic Surgery

## 2020-06-28 ENCOUNTER — Ambulatory Visit (INDEPENDENT_AMBULATORY_CARE_PROVIDER_SITE_OTHER): Payer: Medicare Other | Admitting: Orthopedic Surgery

## 2020-06-28 VITALS — Ht 63.0 in | Wt 122.4 lb

## 2020-06-28 DIAGNOSIS — S32592A Other specified fracture of left pubis, initial encounter for closed fracture: Secondary | ICD-10-CM

## 2020-06-28 NOTE — Progress Notes (Signed)
Post-Op Visit Note   Patient: Kendra Clay           Date of Birth: [birth year]           MRN: [id number] Visit Date: 06/28/2020 PCP: Patient, [name] (Inactive)   Assessment & Plan:  Chief Complaint:  Chief Complaint  Patient presents with  . sacral and rami fracture follow up   Visit Diagnoses:  1. Closed fracture of ramus of left pubis, initial encounter [facility])     Plan: Kendra Clay is a 60 year old patient with left pubic rami fracture.  Transition from walker to cane.  Pain is improving.  Still is on dialysis.  Not taking any medication for pain.  On exam she has good hip flexion abduction and adduction strength.  Gait has improved.  No groin pain with internal or external [place] of the leg.  Impression is healing left pubic rami fractures.  Radiographs do show some callus formation.  Somewhat delayed as would be expected in a patient who is on dialysis.  Plan is that this may take another month to 6 weeks but overall is improving follow-up as needed  Follow-Up Instructions: Return if symptoms worsen or fail to improve.   Orders:  Orders Placed This Encounter  Procedures  . XR Sacrum/Coccyx   No orders of the defined types were placed in this encounter.   Imaging: XR Sacrum/Coccyx  Result Date: 06/28/2020 AP lateral radiographs sacrum and coccyx reviewed.  Left inferior and superior rami fractures appear to have some callus formation present with no further displacement.  Sacroiliac joints appear symmetric and intact.  No new proximal femur fracture bilaterally   PMFS History: Patient Active Problem List   Diagnosis Date Noted  . COVID-19 virus infection 04/25/2020  . Hypertensive urgency 04/25/2020  . COPD (chronic obstructive pulmonary disease) ([place]) 04/25/2020  . HLD (hyperlipidemia) 04/25/2020  . Pelvic fracture ([place]) 04/25/2020  . Pubic ramus fracture ([place]) 04/24/2020  . History of anemia due to CKD 05/19/2018  . Chronic HFrEF (heart failure with reduced  ejection fraction) ([place]) 05/19/2018  . Tibia/fibula fracture, left, closed, initial encounter 05/18/2018  . SOB (shortness of breath) 08/14/2015  . Acute respiratory failure with hypoxia ([employer]) 08/14/2015  . Normocytic normochromic anemia 08/14/2015  . Polycystic kidney disease 07/17/2013  . End stage renal disease ([place]) 08/26/2011   Past Medical History:  Diagnosis Date  . Complication of anesthesia   . 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 Heart murmur    "slight" per ? Dr Deatra Ina 30 years ago. 2D ECHO  30 yearsa go.  Marland Kitchen History of blood transfusion    C- Section  . History of bronchitis    numerous, last time> 1 year  . Hypertension   . Night muscle spasms    legs  . Polycystic kidney disease    Genetic dx 23 years ago  . PONV (postoperative nausea and vomiting)    patch helped  . Scoliosis   . Strabismus     Family History  Problem Relation Age of Onset  . Arthritis Mother   . Kidney disease Father   . Polycystic kidney disease Son   . Asthma Son   . Hypertension Maternal Grandmother     Past Surgical History:  Procedure Laterality Date  . ABDOMINAL HYSTERECTOMY    . AV FISTULA PLACEMENT  09/01/2011   Procedure: ARTERIOVENOUS (AV) FISTULA CREATION;  Surgeon: Rosetta Posner, MD;  Location: [place];  Service: Vascular;  Laterality: Right;  . CESAREAN SECTION  1997  . EYE SURGERY     for lazy eye  . TIBIA IM NAIL INSERTION Left 05/19/2018   Procedure: INTRAMEDULLARY (IM) NAIL TIBIAL;  Surgeon: Hiram Gash, MD;  Location: [place];  Service: Orthopedics;  Laterality: Left;   Social History   Occupational History  . Not on file  Tobacco Use  . Smoking status: Former Smoker    Packs/day: 1.50    Years: 30.00    Pack years: 45.00    Types: Cigarettes    Start date: 1982    Quit date: 01/10/2015    Years since quitting: 5.4  . Smokeless tobacco: Former Systems developer    Quit date: 01/09/2014  Substance and Sexual Activity  . Alcohol use: No    Alcohol/week: 0.0 standard drinks   . Drug use: No  . Sexual activity: Not on file

## 2020-07-27 ENCOUNTER — Other Ambulatory Visit (HOSPITAL_COMMUNITY)
Admission: RE | Admit: 2020-07-27 | Discharge: 2020-07-27 | Disposition: A | Discharge status: Home / Self Care | Payer: Medicare Other | Source: Ambulatory Visit | Attending: Internal Medicine | Admitting: Internal Medicine

## 2020-07-27 DIAGNOSIS — Z01812 Encounter for preprocedural laboratory examination: Secondary | ICD-10-CM | POA: Diagnosis present

## 2020-07-27 DIAGNOSIS — Z20822 Contact with and (suspected) exposure to covid-19: Secondary | ICD-10-CM | POA: Diagnosis not present

## 2020-07-27 LAB — SARS CORONAVIRUS 2 (TAT 6-24 HRS): SARS Coronavirus 2: NEGATIVE

## 2020-07-31 ENCOUNTER — Other Ambulatory Visit: Payer: Self-pay

## 2020-07-31 ENCOUNTER — Ambulatory Visit (INDEPENDENT_AMBULATORY_CARE_PROVIDER_SITE_OTHER): Payer: Medicare Other | Admitting: Internal Medicine

## 2020-07-31 ENCOUNTER — Encounter: Payer: Self-pay | Admitting: Internal Medicine

## 2020-07-31 VITALS — BP 154/82 | HR 73 | Temp 98.0°F | Resp 18 | Ht 63.0 in | Wt 127.2 lb

## 2020-07-31 DIAGNOSIS — D631 Anemia in chronic kidney disease: Secondary | ICD-10-CM | POA: Diagnosis not present

## 2020-07-31 DIAGNOSIS — N186 End stage renal disease: Secondary | ICD-10-CM

## 2020-07-31 DIAGNOSIS — J432 Centrilobular emphysema: Secondary | ICD-10-CM

## 2020-07-31 DIAGNOSIS — Z87891 Personal history of nicotine dependence: Secondary | ICD-10-CM

## 2020-07-31 DIAGNOSIS — R0602 Shortness of breath: Secondary | ICD-10-CM

## 2020-07-31 DIAGNOSIS — Z992 Dependence on renal dialysis: Secondary | ICD-10-CM

## 2020-07-31 LAB — PULMONARY FUNCTION TEST
DL/VA % pred: 61 %
DL/VA: 2.59 ml/min/mmHg/L
DLCO cor % pred: 54 %
DLCO cor: 11 ml/min/mmHg
DLCO unc % pred: 54 %
DLCO unc: 11 ml/min/mmHg
FEF 25-75 Post: 1.67 L/sec
FEF 25-75 Pre: 1.46 L/sec
FEF2575-%Change-Post: 13 %
FEF2575-%Pred-Post: 69 %
FEF2575-%Pred-Pre: 61 %
FEV1-%Change-Post: 3 %
FEV1-%Pred-Post: 68 %
FEV1-%Pred-Pre: 66 %
FEV1-Post: 1.78 L
FEV1-Pre: 1.73 L
FEV1FVC-%Change-Post: 1 %
FEV1FVC-%Pred-Pre: 98 %
FEV6-%Change-Post: 1 %
FEV6-%Pred-Post: 70 %
FEV6-%Pred-Pre: 69 %
FEV6-Post: 2.27 L
FEV6-Pre: 2.24 L
FEV6FVC-%Pred-Post: 103 %
FEV6FVC-%Pred-Pre: 103 %
FVC-%Change-Post: 1 %
FVC-%Pred-Post: 68 %
FVC-%Pred-Pre: 67 %
FVC-Post: 2.28 L
FVC-Pre: 2.24 L
Post FEV1/FVC ratio: 78 %
Post FEV6/FVC ratio: 100 %
Pre FEV1/FVC ratio: 77 %
Pre FEV6/FVC Ratio: 100 %
RV % pred: 77 %
RV: 1.53 L
TLC % pred: 79 %
TLC: 4.01 L

## 2020-07-31 NOTE — Progress Notes (Signed)
PFT done today. 

## 2020-07-31 NOTE — Patient Instructions (Signed)
Come back and see me as needed. I think your breathing issues were related to anemia rather than COPD.

## 2020-07-31 NOTE — Progress Notes (Signed)
Kendra Clay    [id number]    [birth year]  Primary Care Physician:Patient, [name] (Inactive) Date of Appointment: 07/31/2020 Established Patient Visit  Chief complaint:   Chief Complaint  Patient presents with   Follow-up    With PFT, breathing under control     HPI: Kendra Clay is a 60 y.o. woman with history of tobacco use and ESRD on HD with shortness of breath. She is the daughter of my patient June Rash.    Interval Updates: Here for follow up after starting albuterol prn and getting PFTs.  Her Hgb was low earlier this year and she has been on iron tablets and has had iron infusions. Has not needed a transfusion. She does feel that her dyspnea has improved with improving her Hgb from 8 to 11.  Not much improvement in her breathing with albuterol.   Had recent fall with hip fracture, non operative. Pubic ramus fracture. Needed home health and rehab at home.    I have reviewed the patient's family social and past medical history and updated as appropriate.   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 Dr Deatra Ina 30 years ago. 2D ECHO  30 yearsa go.   History of blood transfusion    C- Section   History of bronchitis    numerous, last time> 1 year   Hypertension    Night muscle spasms    legs   Polycystic kidney disease    Genetic dx 23 years ago   PONV (postoperative nausea and vomiting)    patch helped   Scoliosis    Strabismus     Past Surgical History:  Procedure Laterality Date   ABDOMINAL HYSTERECTOMY     AV FISTULA PLACEMENT  09/01/2011   Procedure: ARTERIOVENOUS (AV) FISTULA CREATION;  Surgeon: Rosetta Posner, MD;  Location: [facility];  Service: Vascular;  Laterality: Right;   [place]     for lazy eye   TIBIA IM NAIL INSERTION Left 05/19/2018   Procedure: INTRAMEDULLARY (IM) NAIL TIBIAL;  Surgeon: Hiram Gash,  MD;  Location: [place];  Service: Orthopedics;  Laterality: Left;    Family History  Problem Relation Age of Onset   Arthritis Mother    Kidney disease Father    Polycystic kidney disease Son    Asthma Son    Hypertension Maternal Grandmother     Social History   Occupational History   Not on file  Tobacco Use   Smoking status: Former    Packs/day: 1.50    Years: 30.00    Pack years: 45.00    Types: Cigarettes    Start date: 1982    Quit date: 01/10/2015    Years since quitting: 5.5   Smokeless tobacco: Former    Quit date: 01/09/2014  Substance and Sexual Activity   Alcohol use: No    Alcohol/week: 0.0 standard drinks   Drug use: No   Sexual activity: Not on file     Physical Exam: Blood pressure (!) 154/82, pulse 73, temperature 98 F (36.7 C), temperature source Oral, resp. rate 18, height '5\' 3"'$  (1.6 m), weight 127 lb 3.2 oz (57.7 kg), SpO2 100 %.  Gen:      No acute distress ENT:  no nasal polyps, mucus  membranes moist Lungs:    No increased respiratory effort, symmetric chest wall excursion, dimninished bilaterally, no wheezes or crackles CV:         Regular rate and rhythm; no murmurs, rubs, or gallops.  No pedal edema Ext: RUE AVF with thrill and bruit   Data Reviewed: Imaging:   PFTs:  PFT Results Latest Ref Rng & Units 07/31/2020  FVC-Pre L 2.24  FVC-Predicted Pre % 67  FVC-Post L 2.28  FVC-Predicted Post % 68  Pre FEV1/FVC % % 77  Post FEV1/FCV % % 78  FEV1-Pre L 1.73  FEV1-Predicted Pre % 66  FEV1-Post L 1.78  DLCO uncorrected ml/min/mmHg 11.00  DLCO UNC% % 54  DLCO corrected ml/min/mmHg 11.00  DLCO COR %Predicted % 54  DLVA Predicted % 61  TLC L 4.01  TLC % Predicted % 79  RV % Predicted % 77   I have personally reviewed the patient's PFTs and normal spirometry and lung volumes, diffusion capacity is reduced but not corrected for anemia.   Labs:  Immunization status: Immunization History  Administered Date(s) Administered    Hepatitis B, adult 05/26/2012, 06/30/2012, 07/28/2012, 11/24/2012   Influenza Split 10/29/2018   Influenza,inj,Quad PF,6+ Mos 11/07/2019   PFIZER(Purple Top)SARS-COV-2 Vaccination 04/15/2019, 05/13/2019, 11/02/2019   Pneumococcal Conjugate-13 05/15/2014   Pneumococcal Polysaccharide-23 05/12/2012    Assessment:  Shortness of breath Anemia of chronic disease History of tobacco use  Plan/Recommendations: Symptoms of dyspnea improved with correction of her hemoglobin. Albuterol fine to continue as needed for dyspnea. Her PFTs do not show any evidence of COPD. I am happy to see her back as needed if symptoms change. Continue follow up in lung cancer screening clinic.    Return to Care: Return if symptoms worsen or fail to improve.   Lenice Llamas, MD Pulmonary and [place]

## 2020-12-29 ENCOUNTER — Other Ambulatory Visit: Payer: Self-pay | Admitting: Internal Medicine

## 2020-12-29 DIAGNOSIS — J432 Centrilobular emphysema: Secondary | ICD-10-CM

## 2021-07-16 ENCOUNTER — Other Ambulatory Visit: Payer: Self-pay | Admitting: *Deleted

## 2021-07-16 DIAGNOSIS — Z122 Encounter for screening for malignant neoplasm of respiratory organs: Secondary | ICD-10-CM

## 2021-07-16 DIAGNOSIS — Z87891 Personal history of nicotine dependence: Secondary | ICD-10-CM

## 2021-08-13 ENCOUNTER — Ambulatory Visit
Admission: RE | Admit: 2021-08-13 | Discharge: 2021-08-13 | Disposition: A | Discharge status: Home / Self Care | Payer: Medicare Other | Source: Ambulatory Visit | Attending: Acute Care | Admitting: Acute Care

## 2021-08-13 DIAGNOSIS — Z122 Encounter for screening for malignant neoplasm of respiratory organs: Secondary | ICD-10-CM

## 2021-08-13 DIAGNOSIS — Z87891 Personal history of nicotine dependence: Secondary | ICD-10-CM

## 2021-08-14 ENCOUNTER — Other Ambulatory Visit: Payer: Self-pay

## 2021-08-14 DIAGNOSIS — Z122 Encounter for screening for malignant neoplasm of respiratory organs: Secondary | ICD-10-CM

## 2021-08-14 DIAGNOSIS — Z87891 Personal history of nicotine dependence: Secondary | ICD-10-CM

## 2021-08-29 ENCOUNTER — Emergency Department (HOSPITAL_COMMUNITY): Payer: Medicare Other | Admitting: Certified Registered"

## 2021-08-29 ENCOUNTER — Emergency Department (HOSPITAL_COMMUNITY): Payer: Medicare Other

## 2021-08-29 ENCOUNTER — Encounter (HOSPITAL_COMMUNITY): Payer: Self-pay

## 2021-08-29 ENCOUNTER — Other Ambulatory Visit: Payer: Self-pay

## 2021-08-29 ENCOUNTER — Emergency Department (EMERGENCY_DEPARTMENT_HOSPITAL): Payer: Medicare Other | Admitting: Certified Registered"

## 2021-08-29 ENCOUNTER — Encounter (HOSPITAL_COMMUNITY)
Admission: EM | Disposition: A | Discharge status: Rehabilitation Facility | Payer: Self-pay | Source: Home / Self Care | Attending: Family Medicine

## 2021-08-29 ENCOUNTER — Inpatient Hospital Stay (HOSPITAL_COMMUNITY)
Admission: EM | Admit: 2021-08-29 | Discharge: 2021-09-09 | DRG: 480 | Disposition: A | Discharge status: Rehabilitation Facility | Payer: Medicare Other | Attending: Family Medicine | Admitting: Family Medicine

## 2021-08-29 DIAGNOSIS — N2581 Secondary hyperparathyroidism of renal origin: Secondary | ICD-10-CM | POA: Diagnosis present

## 2021-08-29 DIAGNOSIS — Z8249 Family history of ischemic heart disease and other diseases of the circulatory system: Secondary | ICD-10-CM

## 2021-08-29 DIAGNOSIS — R197 Diarrhea, unspecified: Secondary | ICD-10-CM | POA: Diagnosis present

## 2021-08-29 DIAGNOSIS — Z882 Allergy status to sulfonamides status: Secondary | ICD-10-CM

## 2021-08-29 DIAGNOSIS — S52592A Other fractures of lower end of left radius, initial encounter for closed fracture: Secondary | ICD-10-CM | POA: Diagnosis present

## 2021-08-29 DIAGNOSIS — I420 Dilated cardiomyopathy: Secondary | ICD-10-CM | POA: Diagnosis present

## 2021-08-29 DIAGNOSIS — Z881 Allergy status to other antibiotic agents status: Secondary | ICD-10-CM

## 2021-08-29 DIAGNOSIS — S82221B Displaced transverse fracture of shaft of right tibia, initial encounter for open fracture type I or II: Secondary | ICD-10-CM | POA: Diagnosis present

## 2021-08-29 DIAGNOSIS — I10 Essential (primary) hypertension: Secondary | ICD-10-CM | POA: Diagnosis present

## 2021-08-29 DIAGNOSIS — S62102A Fracture of unspecified carpal bone, left wrist, initial encounter for closed fracture: Secondary | ICD-10-CM | POA: Diagnosis not present

## 2021-08-29 DIAGNOSIS — K219 Gastro-esophageal reflux disease without esophagitis: Secondary | ICD-10-CM | POA: Diagnosis present

## 2021-08-29 DIAGNOSIS — S52692A Other fracture of lower end of left ulna, initial encounter for closed fracture: Secondary | ICD-10-CM | POA: Diagnosis present

## 2021-08-29 DIAGNOSIS — Z955 Presence of coronary angioplasty implant and graft: Secondary | ICD-10-CM | POA: Diagnosis not present

## 2021-08-29 DIAGNOSIS — W109XXA Fall (on) (from) unspecified stairs and steps, initial encounter: Secondary | ICD-10-CM | POA: Diagnosis present

## 2021-08-29 DIAGNOSIS — S82401B Unspecified fracture of shaft of right fibula, initial encounter for open fracture type I or II: Secondary | ICD-10-CM | POA: Diagnosis not present

## 2021-08-29 DIAGNOSIS — D61818 Other pancytopenia: Secondary | ICD-10-CM | POA: Diagnosis present

## 2021-08-29 DIAGNOSIS — S82201A Unspecified fracture of shaft of right tibia, initial encounter for closed fracture: Secondary | ICD-10-CM

## 2021-08-29 DIAGNOSIS — Q612 Polycystic kidney, adult type: Secondary | ICD-10-CM | POA: Diagnosis not present

## 2021-08-29 DIAGNOSIS — E785 Hyperlipidemia, unspecified: Secondary | ICD-10-CM | POA: Diagnosis present

## 2021-08-29 DIAGNOSIS — Z79899 Other long term (current) drug therapy: Secondary | ICD-10-CM

## 2021-08-29 DIAGNOSIS — D631 Anemia in chronic kidney disease: Secondary | ICD-10-CM | POA: Diagnosis present

## 2021-08-29 DIAGNOSIS — R0902 Hypoxemia: Secondary | ICD-10-CM | POA: Diagnosis present

## 2021-08-29 DIAGNOSIS — E8889 Other specified metabolic disorders: Secondary | ICD-10-CM | POA: Diagnosis present

## 2021-08-29 DIAGNOSIS — E559 Vitamin D deficiency, unspecified: Secondary | ICD-10-CM | POA: Diagnosis present

## 2021-08-29 DIAGNOSIS — I959 Hypotension, unspecified: Secondary | ICD-10-CM | POA: Diagnosis not present

## 2021-08-29 DIAGNOSIS — W010XXD Fall on same level from slipping, tripping and stumbling without subsequent striking against object, subsequent encounter: Secondary | ICD-10-CM | POA: Diagnosis present

## 2021-08-29 DIAGNOSIS — Z841 Family history of disorders of kidney and ureter: Secondary | ICD-10-CM

## 2021-08-29 DIAGNOSIS — S2249XA Multiple fractures of ribs, unspecified side, initial encounter for closed fracture: Secondary | ICD-10-CM | POA: Diagnosis not present

## 2021-08-29 DIAGNOSIS — E871 Hypo-osmolality and hyponatremia: Secondary | ICD-10-CM | POA: Diagnosis present

## 2021-08-29 DIAGNOSIS — R638 Other symptoms and signs concerning food and fluid intake: Secondary | ICD-10-CM | POA: Diagnosis not present

## 2021-08-29 DIAGNOSIS — I5042 Chronic combined systolic (congestive) and diastolic (congestive) heart failure: Secondary | ICD-10-CM | POA: Diagnosis present

## 2021-08-29 DIAGNOSIS — D62 Acute posthemorrhagic anemia: Secondary | ICD-10-CM | POA: Diagnosis not present

## 2021-08-29 DIAGNOSIS — Z8616 Personal history of COVID-19: Secondary | ICD-10-CM | POA: Diagnosis not present

## 2021-08-29 DIAGNOSIS — Z992 Dependence on renal dialysis: Secondary | ICD-10-CM | POA: Diagnosis not present

## 2021-08-29 DIAGNOSIS — Y9301 Activity, walking, marching and hiking: Secondary | ICD-10-CM | POA: Diagnosis present

## 2021-08-29 DIAGNOSIS — Q613 Polycystic kidney, unspecified: Secondary | ICD-10-CM

## 2021-08-29 DIAGNOSIS — R5381 Other malaise: Secondary | ICD-10-CM | POA: Diagnosis present

## 2021-08-29 DIAGNOSIS — U071 COVID-19: Secondary | ICD-10-CM | POA: Diagnosis present

## 2021-08-29 DIAGNOSIS — R160 Hepatomegaly, not elsewhere classified: Secondary | ICD-10-CM | POA: Diagnosis present

## 2021-08-29 DIAGNOSIS — S72402A Unspecified fracture of lower end of left femur, initial encounter for closed fracture: Secondary | ICD-10-CM | POA: Diagnosis present

## 2021-08-29 DIAGNOSIS — S82201B Unspecified fracture of shaft of right tibia, initial encounter for open fracture type I or II: Secondary | ICD-10-CM | POA: Insufficient documentation

## 2021-08-29 DIAGNOSIS — I5022 Chronic systolic (congestive) heart failure: Secondary | ICD-10-CM | POA: Diagnosis present

## 2021-08-29 DIAGNOSIS — I25810 Atherosclerosis of coronary artery bypass graft(s) without angina pectoris: Secondary | ICD-10-CM | POA: Diagnosis not present

## 2021-08-29 DIAGNOSIS — Y92008 Other place in unspecified non-institutional (private) residence as the place of occurrence of the external cause: Secondary | ICD-10-CM | POA: Diagnosis not present

## 2021-08-29 DIAGNOSIS — K5900 Constipation, unspecified: Secondary | ICD-10-CM | POA: Diagnosis not present

## 2021-08-29 DIAGNOSIS — S72452A Displaced supracondylar fracture without intracondylar extension of lower end of left femur, initial encounter for closed fracture: Secondary | ICD-10-CM | POA: Diagnosis present

## 2021-08-29 DIAGNOSIS — I2510 Atherosclerotic heart disease of native coronary artery without angina pectoris: Secondary | ICD-10-CM | POA: Diagnosis present

## 2021-08-29 DIAGNOSIS — S82201E Unspecified fracture of shaft of right tibia, subsequent encounter for open fracture type I or II with routine healing: Secondary | ICD-10-CM | POA: Diagnosis not present

## 2021-08-29 DIAGNOSIS — M419 Scoliosis, unspecified: Secondary | ICD-10-CM | POA: Diagnosis present

## 2021-08-29 DIAGNOSIS — S52502D Unspecified fracture of the lower end of left radius, subsequent encounter for closed fracture with routine healing: Secondary | ICD-10-CM | POA: Diagnosis not present

## 2021-08-29 DIAGNOSIS — J439 Emphysema, unspecified: Secondary | ICD-10-CM | POA: Diagnosis present

## 2021-08-29 DIAGNOSIS — Z634 Disappearance and death of family member: Secondary | ICD-10-CM

## 2021-08-29 DIAGNOSIS — W1830XA Fall on same level, unspecified, initial encounter: Secondary | ICD-10-CM

## 2021-08-29 DIAGNOSIS — N189 Chronic kidney disease, unspecified: Secondary | ICD-10-CM | POA: Diagnosis not present

## 2021-08-29 DIAGNOSIS — S7292XD Unspecified fracture of left femur, subsequent encounter for closed fracture with routine healing: Secondary | ICD-10-CM | POA: Diagnosis present

## 2021-08-29 DIAGNOSIS — Z888 Allergy status to other drugs, medicaments and biological substances status: Secondary | ICD-10-CM

## 2021-08-29 DIAGNOSIS — I132 Hypertensive heart and chronic kidney disease with heart failure and with stage 5 chronic kidney disease, or end stage renal disease: Secondary | ICD-10-CM | POA: Diagnosis present

## 2021-08-29 DIAGNOSIS — M79605 Pain in left leg: Secondary | ICD-10-CM | POA: Diagnosis present

## 2021-08-29 DIAGNOSIS — Z87891 Personal history of nicotine dependence: Secondary | ICD-10-CM

## 2021-08-29 DIAGNOSIS — S82421B Displaced transverse fracture of shaft of right fibula, initial encounter for open fracture type I or II: Secondary | ICD-10-CM | POA: Diagnosis present

## 2021-08-29 DIAGNOSIS — S52602D Unspecified fracture of lower end of left ulna, subsequent encounter for closed fracture with routine healing: Secondary | ICD-10-CM | POA: Diagnosis not present

## 2021-08-29 DIAGNOSIS — N186 End stage renal disease: Secondary | ICD-10-CM | POA: Diagnosis present

## 2021-08-29 DIAGNOSIS — R066 Hiccough: Secondary | ICD-10-CM | POA: Diagnosis not present

## 2021-08-29 DIAGNOSIS — S8292XA Unspecified fracture of left lower leg, initial encounter for closed fracture: Secondary | ICD-10-CM | POA: Diagnosis not present

## 2021-08-29 DIAGNOSIS — S52502A Unspecified fracture of the lower end of left radius, initial encounter for closed fracture: Secondary | ICD-10-CM

## 2021-08-29 DIAGNOSIS — R110 Nausea: Secondary | ICD-10-CM | POA: Diagnosis not present

## 2021-08-29 DIAGNOSIS — S8291XA Unspecified fracture of right lower leg, initial encounter for closed fracture: Secondary | ICD-10-CM | POA: Diagnosis not present

## 2021-08-29 DIAGNOSIS — R69 Illness, unspecified: Principal | ICD-10-CM

## 2021-08-29 DIAGNOSIS — Z90710 Acquired absence of both cervix and uterus: Secondary | ICD-10-CM

## 2021-08-29 DIAGNOSIS — E876 Hypokalemia: Secondary | ICD-10-CM | POA: Diagnosis present

## 2021-08-29 HISTORY — PX: TIBIA IM NAIL INSERTION: SHX2516

## 2021-08-29 HISTORY — DX: Dependence on renal dialysis: N18.6

## 2021-08-29 HISTORY — PX: ORIF FEMUR FRACTURE: SHX2119

## 2021-08-29 HISTORY — PX: ORIF WRIST FRACTURE: SHX2133

## 2021-08-29 HISTORY — DX: Dependence on renal dialysis: Z99.2

## 2021-08-29 LAB — CBC WITH DIFFERENTIAL/PLATELET
Abs Immature Granulocytes: 0.02 10*3/uL (ref 0.00–0.07)
Basophils Absolute: 0 10*3/uL (ref 0.0–0.1)
Basophils Relative: 0 %
Eosinophils Absolute: 0 10*3/uL (ref 0.0–0.5)
Eosinophils Relative: 0 %
HCT: 24.1 % — ABNORMAL LOW (ref 36.0–46.0)
Hemoglobin: 7.6 g/dL — ABNORMAL LOW (ref 12.0–15.0)
Immature Granulocytes: 1 %
Lymphocytes Relative: 21 %
Lymphs Abs: 0.5 10*3/uL — ABNORMAL LOW (ref 0.7–4.0)
MCH: 28.7 pg (ref 26.0–34.0)
MCHC: 31.5 g/dL (ref 30.0–36.0)
MCV: 90.9 fL (ref 80.0–100.0)
Monocytes Absolute: 0.1 10*3/uL (ref 0.1–1.0)
Monocytes Relative: 5 %
Neutro Abs: 1.9 10*3/uL (ref 1.7–7.7)
Neutrophils Relative %: 73 %
Platelets: 82 10*3/uL — ABNORMAL LOW (ref 150–400)
RBC: 2.65 MIL/uL — ABNORMAL LOW (ref 3.87–5.11)
RDW: 14.6 % (ref 11.5–15.5)
WBC: 2.6 10*3/uL — ABNORMAL LOW (ref 4.0–10.5)
nRBC: 0 % (ref 0.0–0.2)

## 2021-08-29 LAB — COMPREHENSIVE METABOLIC PANEL
ALT: 16 U/L (ref 0–44)
AST: 25 U/L (ref 15–41)
Albumin: 2.6 g/dL — ABNORMAL LOW (ref 3.5–5.0)
Alkaline Phosphatase: 152 U/L — ABNORMAL HIGH (ref 38–126)
Anion gap: 12 (ref 5–15)
BUN: 23 mg/dL — ABNORMAL HIGH (ref 6–20)
CO2: 25 mmol/L (ref 22–32)
Calcium: 7.1 mg/dL — ABNORMAL LOW (ref 8.9–10.3)
Chloride: 95 mmol/L — ABNORMAL LOW (ref 98–111)
Creatinine, Ser: 6.59 mg/dL — ABNORMAL HIGH (ref 0.44–1.00)
GFR, Estimated: 7 mL/min — ABNORMAL LOW (ref >60)
Glucose, Bld: 119 mg/dL — ABNORMAL HIGH (ref 70–99)
Potassium: 3.3 mmol/L — ABNORMAL LOW (ref 3.5–5.1)
Sodium: 132 mmol/L — ABNORMAL LOW (ref 135–145)
Total Bilirubin: 0.7 mg/dL (ref 0.3–1.2)
Total Protein: 5.6 g/dL — ABNORMAL LOW (ref 6.5–8.1)

## 2021-08-29 LAB — PROTIME-INR
INR: 1.2 (ref 0.8–1.2)
Prothrombin Time: 14.8 seconds (ref 11.4–15.2)

## 2021-08-29 LAB — SURGICAL PCR SCREEN
MRSA, PCR: NEGATIVE
Staphylococcus aureus: NEGATIVE

## 2021-08-29 LAB — PREPARE RBC (CROSSMATCH)

## 2021-08-29 SURGERY — INSERTION, INTRAMEDULLARY ROD, TIBIA
Anesthesia: General | Site: Wrist | Laterality: Right

## 2021-08-29 MED ORDER — 0.9 % SODIUM CHLORIDE (POUR BTL) OPTIME
TOPICAL | Status: DC | PRN
  Administered 2021-08-29: 1000 mL
  Administered 2021-08-29: 3000 mL

## 2021-08-29 MED ORDER — SODIUM CHLORIDE 0.9 % IV SOLN: INTRAVENOUS | Status: DC

## 2021-08-29 MED ORDER — DIPHENHYDRAMINE HCL 12.5 MG/5ML PO ELIX: 12.5000 mg | ORAL_SOLUTION | ORAL | Status: DC | PRN

## 2021-08-29 MED ORDER — CEFAZOLIN SODIUM-DEXTROSE 2-4 GM/100ML-% IV SOLN: 2.0000 g | INTRAVENOUS | Status: DC

## 2021-08-29 MED ORDER — HYDRALAZINE HCL 10 MG PO TABS: 10.0000 mg | ORAL_TABLET | Freq: Four times a day (QID) | ORAL | Status: DC | PRN

## 2021-08-29 MED ORDER — FENTANYL CITRATE (PF) 250 MCG/5ML IJ SOLN
INTRAMUSCULAR | Status: DC | PRN
  Administered 2021-08-29 (×2): 50 ug via INTRAVENOUS

## 2021-08-29 MED ORDER — ACETAMINOPHEN 500 MG PO TABS
1000.0000 mg | ORAL_TABLET | Freq: Four times a day (QID) | ORAL | Status: AC
  Administered 2021-08-29 – 2021-08-30 (×4): 1000 mg via ORAL
  Filled 2021-08-29 (×5): qty 2

## 2021-08-29 MED ORDER — SODIUM CHLORIDE 0.9 % IV SOLN: 10.0000 mL/h | Freq: Once | INTRAVENOUS | Status: DC

## 2021-08-29 MED ORDER — HYDROMORPHONE HCL 1 MG/ML IJ SOLN
INTRAMUSCULAR | Status: AC
  Filled 2021-08-29: qty 1

## 2021-08-29 MED ORDER — PROPOFOL 10 MG/ML IV BOLUS
INTRAVENOUS | Status: AC
  Filled 2021-08-29: qty 20

## 2021-08-29 MED ORDER — CEFAZOLIN SODIUM-DEXTROSE 2-4 GM/100ML-% IV SOLN: 2.0000 g | Freq: Three times a day (TID) | INTRAVENOUS | Status: DC

## 2021-08-29 MED ORDER — POVIDONE-IODINE 10 % EX SWAB
2.0000 | Freq: Once | CUTANEOUS | Status: AC
  Administered 2021-08-29: 2 via TOPICAL

## 2021-08-29 MED ORDER — CEFAZOLIN SODIUM-DEXTROSE 1-4 GM/50ML-% IV SOLN
INTRAVENOUS | Status: AC
  Filled 2021-08-29: qty 50

## 2021-08-29 MED ORDER — FENTANYL CITRATE PF 50 MCG/ML IJ SOSY
50.0000 ug | PREFILLED_SYRINGE | INTRAMUSCULAR | Status: DC | PRN
  Administered 2021-08-29 (×2): 50 ug via INTRAVENOUS
  Filled 2021-08-29 (×2): qty 1

## 2021-08-29 MED ORDER — CEFAZOLIN SODIUM-DEXTROSE 1-4 GM/50ML-% IV SOLN
1.0000 g | Freq: Once | INTRAVENOUS | Status: AC
  Administered 2021-08-29: 1 g via INTRAVENOUS
  Filled 2021-08-29: qty 50

## 2021-08-29 MED ORDER — ORAL CARE MOUTH RINSE: 15.0000 mL | Freq: Once | OROMUCOSAL | Status: AC

## 2021-08-29 MED ORDER — METOCLOPRAMIDE HCL 5 MG/ML IJ SOLN: 5.0000 mg | Freq: Three times a day (TID) | INTRAMUSCULAR | Status: DC | PRN

## 2021-08-29 MED ORDER — METOCLOPRAMIDE HCL 5 MG PO TABS: 5.0000 mg | ORAL_TABLET | Freq: Three times a day (TID) | ORAL | Status: DC | PRN

## 2021-08-29 MED ORDER — ONDANSETRON HCL 4 MG/2ML IJ SOLN
INTRAMUSCULAR | Status: DC | PRN
  Administered 2021-08-29: 4 mg via INTRAVENOUS

## 2021-08-29 MED ORDER — SUGAMMADEX SODIUM 200 MG/2ML IV SOLN
INTRAVENOUS | Status: DC | PRN
  Administered 2021-08-29: 125 mg via INTRAVENOUS

## 2021-08-29 MED ORDER — PHENYLEPHRINE 80 MCG/ML (10ML) SYRINGE FOR IV PUSH (FOR BLOOD PRESSURE SUPPORT)
PREFILLED_SYRINGE | INTRAVENOUS | Status: DC | PRN
  Administered 2021-08-29: 160 ug via INTRAVENOUS

## 2021-08-29 MED ORDER — VANCOMYCIN HCL 1000 MG IV SOLR
INTRAVENOUS | Status: AC
  Filled 2021-08-29: qty 60

## 2021-08-29 MED ORDER — CHLORHEXIDINE GLUCONATE 4 % EX LIQD: 60.0000 mL | Freq: Once | CUTANEOUS | Status: DC

## 2021-08-29 MED ORDER — METHOCARBAMOL 1000 MG/10ML IJ SOLN: 500.0000 mg | Freq: Four times a day (QID) | INTRAVENOUS | Status: DC | PRN

## 2021-08-29 MED ORDER — ONDANSETRON HCL 4 MG PO TABS: 4.0000 mg | ORAL_TABLET | Freq: Four times a day (QID) | ORAL | Status: DC | PRN

## 2021-08-29 MED ORDER — ACETAMINOPHEN 325 MG PO TABS
325.0000 mg | ORAL_TABLET | Freq: Four times a day (QID) | ORAL | Status: DC | PRN
  Administered 2021-09-09: 650 mg via ORAL
  Filled 2021-08-29: qty 2

## 2021-08-29 MED ORDER — CHLORHEXIDINE GLUCONATE 0.12 % MT SOLN: 15.0000 mL | Freq: Once | OROMUCOSAL | Status: AC

## 2021-08-29 MED ORDER — DEXAMETHASONE SODIUM PHOSPHATE 10 MG/ML IJ SOLN
INTRAMUSCULAR | Status: DC | PRN
  Administered 2021-08-29: 5 mg via INTRAVENOUS

## 2021-08-29 MED ORDER — CEFAZOLIN SODIUM-DEXTROSE 2-4 GM/100ML-% IV SOLN
2.0000 g | Freq: Once | INTRAVENOUS | Status: DC
Start: 2021-08-29 — End: 2021-08-29
  Filled 2021-08-29: qty 100

## 2021-08-29 MED ORDER — CHLORHEXIDINE GLUCONATE 0.12 % MT SOLN
OROMUCOSAL | Status: AC
  Administered 2021-08-29: 15 mL via OROMUCOSAL
  Filled 2021-08-29: qty 15

## 2021-08-29 MED ORDER — HYDROMORPHONE HCL 1 MG/ML IJ SOLN
0.2500 mg | INTRAMUSCULAR | Status: DC | PRN
  Administered 2021-08-29 (×2): 0.5 mg via INTRAVENOUS

## 2021-08-29 MED ORDER — MIDAZOLAM HCL 2 MG/2ML IJ SOLN
INTRAMUSCULAR | Status: DC | PRN
  Administered 2021-08-29: 1 mg via INTRAVENOUS

## 2021-08-29 MED ORDER — VANCOMYCIN HCL 1000 MG IV SOLR
INTRAVENOUS | Status: DC | PRN
  Administered 2021-08-29 (×3): 1000 mg

## 2021-08-29 MED ORDER — PHENYLEPHRINE HCL-NACL 20-0.9 MG/250ML-% IV SOLN
INTRAVENOUS | Status: DC | PRN
  Administered 2021-08-29: 40 ug/min via INTRAVENOUS

## 2021-08-29 MED ORDER — POLYETHYLENE GLYCOL 3350 17 G PO PACK: 17.0000 g | PACK | Freq: Every day | ORAL | Status: DC | PRN

## 2021-08-29 MED ORDER — ONDANSETRON HCL 4 MG/2ML IJ SOLN
4.0000 mg | Freq: Four times a day (QID) | INTRAMUSCULAR | Status: DC | PRN
  Administered 2021-09-01 – 2021-09-03 (×2): 4 mg via INTRAVENOUS
  Filled 2021-08-29 (×2): qty 2

## 2021-08-29 MED ORDER — METHOCARBAMOL 500 MG PO TABS
500.0000 mg | ORAL_TABLET | Freq: Four times a day (QID) | ORAL | Status: DC | PRN
  Administered 2021-08-29 – 2021-09-03 (×6): 500 mg via ORAL
  Filled 2021-08-29 (×6): qty 1

## 2021-08-29 MED ORDER — LIDOCAINE 2% (20 MG/ML) 5 ML SYRINGE
INTRAMUSCULAR | Status: AC
  Filled 2021-08-29: qty 5

## 2021-08-29 MED ORDER — PROMETHAZINE HCL 25 MG/ML IJ SOLN
6.2500 mg | Freq: Once | INTRAMUSCULAR | Status: DC
Start: 2021-08-29 — End: 2021-08-29

## 2021-08-29 MED ORDER — TRANEXAMIC ACID-NACL 1000-0.7 MG/100ML-% IV SOLN
1000.0000 mg | INTRAVENOUS | Status: AC
Start: 2021-08-29 — End: 2021-08-29
  Administered 2021-08-29: 1000 mg via INTRAVENOUS

## 2021-08-29 MED ORDER — LIDOCAINE 2% (20 MG/ML) 5 ML SYRINGE
INTRAMUSCULAR | Status: DC | PRN
  Administered 2021-08-29: 100 mg via INTRAVENOUS
  Administered 2021-08-29: 50 mg via INTRAVENOUS

## 2021-08-29 MED ORDER — TRAMADOL HCL 50 MG PO TABS
50.0000 mg | ORAL_TABLET | Freq: Two times a day (BID) | ORAL | Status: DC
  Administered 2021-08-29: 50 mg via ORAL
  Filled 2021-08-29 (×2): qty 1

## 2021-08-29 MED ORDER — ROCURONIUM BROMIDE 10 MG/ML (PF) SYRINGE
PREFILLED_SYRINGE | INTRAVENOUS | Status: DC | PRN
  Administered 2021-08-29: 50 mg via INTRAVENOUS

## 2021-08-29 MED ORDER — DOCUSATE SODIUM 100 MG PO CAPS
100.0000 mg | ORAL_CAPSULE | Freq: Two times a day (BID) | ORAL | Status: DC
  Administered 2021-08-31 – 2021-09-09 (×10): 100 mg via ORAL
  Filled 2021-08-29 (×20): qty 1

## 2021-08-29 MED ORDER — MIDAZOLAM HCL 2 MG/2ML IJ SOLN
INTRAMUSCULAR | Status: AC
  Filled 2021-08-29: qty 2

## 2021-08-29 MED ORDER — CEFAZOLIN SODIUM-DEXTROSE 1-4 GM/50ML-% IV SOLN
1.0000 g | Freq: Three times a day (TID) | INTRAVENOUS | Status: DC
  Administered 2021-08-29: 1 g via INTRAVENOUS

## 2021-08-29 MED ORDER — TRANEXAMIC ACID-NACL 1000-0.7 MG/100ML-% IV SOLN
INTRAVENOUS | Status: AC
  Filled 2021-08-29: qty 100

## 2021-08-29 MED ORDER — OXYCODONE HCL 5 MG PO TABS
5.0000 mg | ORAL_TABLET | ORAL | Status: DC | PRN
  Administered 2021-08-29: 5 mg via ORAL
  Administered 2021-08-30: 10 mg via ORAL
  Administered 2021-08-30 (×2): 5 mg via ORAL
  Administered 2021-08-31 – 2021-09-05 (×7): 10 mg via ORAL
  Administered 2021-09-06: 5 mg via ORAL
  Administered 2021-09-07: 10 mg via ORAL
  Administered 2021-09-07: 5 mg via ORAL
  Filled 2021-08-29 (×2): qty 1
  Filled 2021-08-29 (×5): qty 2
  Filled 2021-08-29: qty 1
  Filled 2021-08-29 (×5): qty 2
  Filled 2021-08-29: qty 1

## 2021-08-29 MED ORDER — HYDROMORPHONE HCL 1 MG/ML IJ SOLN
0.5000 mg | INTRAMUSCULAR | Status: DC | PRN
  Administered 2021-09-03: 1 mg via INTRAVENOUS
  Filled 2021-08-29: qty 1

## 2021-08-29 MED ORDER — PROPOFOL 10 MG/ML IV BOLUS
INTRAVENOUS | Status: DC | PRN
  Administered 2021-08-29: 80 mg via INTRAVENOUS

## 2021-08-29 MED ORDER — FENTANYL CITRATE (PF) 250 MCG/5ML IJ SOLN
INTRAMUSCULAR | Status: AC
  Filled 2021-08-29: qty 5

## 2021-08-29 SURGICAL SUPPLY — 126 items
ADH SKN CLS APL DERMABOND .7 (GAUZE/BANDAGES/DRESSINGS) ×9
APL PRP STRL LF DISP 70% ISPRP (MISCELLANEOUS) ×3
BIT DRILL 100X2XQC STRL (BIT) IMPLANT
BIT DRILL 4.3 (BIT) ×4
BIT DRILL 4.3X300MM (BIT) IMPLANT
BIT DRILL CALIBRATED 1.8MM (BIT) IMPLANT
BIT DRILL FLEXIBLE LONG 12 (BIT) ×1 IMPLANT
BIT DRILL LONG 3.3 (BIT) ×1 IMPLANT
BIT DRILL LONG 4.2 (BIT) ×1 IMPLANT
BIT DRILL QC 2.0X100 (BIT) ×4
BIT DRILL QC 3.3X195 (BIT) ×1 IMPLANT
BIT DRILL SHORT 4.2 (BIT) IMPLANT
BIT DRL 100X2XQC STRL (BIT) ×3
BLADE SURG 10 STRL SS (BLADE) ×8 IMPLANT
BNDG CMPR 9X4 STRL LF SNTH (GAUZE/BANDAGES/DRESSINGS) ×3
BNDG CMPR MED 10X6 ELC LF (GAUZE/BANDAGES/DRESSINGS)
BNDG COHESIVE 4X5 TAN STRL (GAUZE/BANDAGES/DRESSINGS) ×3 IMPLANT
BNDG COHESIVE 6X5 TAN STRL LF (GAUZE/BANDAGES/DRESSINGS) ×3 IMPLANT
BNDG ELASTIC 3X5.8 VLCR STR LF (GAUZE/BANDAGES/DRESSINGS) ×4 IMPLANT
BNDG ELASTIC 4X5.8 VLCR STR LF (GAUZE/BANDAGES/DRESSINGS) ×4 IMPLANT
BNDG ELASTIC 6X10 VLCR STRL LF (GAUZE/BANDAGES/DRESSINGS) ×3 IMPLANT
BNDG ELASTIC 6X5.8 VLCR STR LF (GAUZE/BANDAGES/DRESSINGS) ×4 IMPLANT
BNDG ESMARK 4X9 LF (GAUZE/BANDAGES/DRESSINGS) ×4 IMPLANT
BNDG GAUZE ELAST 4 BULKY (GAUZE/BANDAGES/DRESSINGS) ×3 IMPLANT
BRUSH SCRUB EZ PLAIN DRY (MISCELLANEOUS) ×8 IMPLANT
CANISTER SUCT 3000ML PPV (MISCELLANEOUS) ×4 IMPLANT
CAP LOCK NCB (Cap) ×6 IMPLANT
CHLORAPREP W/TINT 26 (MISCELLANEOUS) ×4 IMPLANT
CONT SPEC 4OZ CLIKSEAL STRL BL (MISCELLANEOUS) ×1 IMPLANT
COVER SURGICAL LIGHT HANDLE (MISCELLANEOUS) ×8 IMPLANT
DERMABOND ADVANCED (GAUZE/BANDAGES/DRESSINGS) ×3
DERMABOND ADVANCED .7 DNX12 (GAUZE/BANDAGES/DRESSINGS) IMPLANT
DISTAL RADIUS VOL 2.4NAR 6/3H (Orthopedic Implant) ×4 IMPLANT
DRAPE BILATERAL LIMB T (DRAPES) ×1 IMPLANT
DRAPE C-ARM 42X72 X-RAY (DRAPES) ×5 IMPLANT
DRAPE C-ARMOR (DRAPES) ×5 IMPLANT
DRAPE EXTREMITY ABCS (DRAPES) ×1 IMPLANT
DRAPE HALF SHEET 40X57 (DRAPES) ×8 IMPLANT
DRAPE IMP U-DRAPE 54X76 (DRAPES) ×8 IMPLANT
DRAPE INCISE IOBAN 66X45 STRL (DRAPES) ×2 IMPLANT
DRAPE ORTHO SPLIT 77X108 STRL (DRAPES) ×8
DRAPE SURG 17X23 STRL (DRAPES) ×4 IMPLANT
DRAPE SURG ORHT 6 SPLT 77X108 (DRAPES) ×6 IMPLANT
DRAPE U-SHAPE 47X51 STRL (DRAPES) ×4 IMPLANT
DRESSING MEPILEX FLEX 4X4 (GAUZE/BANDAGES/DRESSINGS) IMPLANT
DRILL BIT SHORT 4.2 (BIT) ×8
DRILL CALIBRATED 1.8MM (BIT) ×4
DRSG ADAPTIC 3X8 NADH LF (GAUZE/BANDAGES/DRESSINGS) ×3 IMPLANT
DRSG MEPILEX BORDER 4X12 (GAUZE/BANDAGES/DRESSINGS) IMPLANT
DRSG MEPILEX BORDER 4X4 (GAUZE/BANDAGES/DRESSINGS) IMPLANT
DRSG MEPILEX BORDER 4X8 (GAUZE/BANDAGES/DRESSINGS) ×3 IMPLANT
DRSG MEPILEX FLEX 4X4 (GAUZE/BANDAGES/DRESSINGS) ×12
DRSG PAD ABDOMINAL 8X10 ST (GAUZE/BANDAGES/DRESSINGS) ×9 IMPLANT
ELECT REM PT RETURN 9FT ADLT (ELECTROSURGICAL) ×4
ELECTRODE REM PT RTRN 9FT ADLT (ELECTROSURGICAL) ×3 IMPLANT
GAUZE SPONGE 4X4 12PLY STRL (GAUZE/BANDAGES/DRESSINGS) ×4 IMPLANT
GAUZE XEROFORM 1X8 LF (GAUZE/BANDAGES/DRESSINGS) ×3 IMPLANT
GLOVE BIO SURGEON STRL SZ 6.5 (GLOVE) ×12 IMPLANT
GLOVE BIO SURGEON STRL SZ7.5 (GLOVE) ×16 IMPLANT
GLOVE BIOGEL PI IND STRL 6.5 (GLOVE) ×3 IMPLANT
GLOVE BIOGEL PI IND STRL 7.0 (GLOVE) ×3 IMPLANT
GLOVE BIOGEL PI IND STRL 7.5 (GLOVE) ×3 IMPLANT
GLOVE BIOGEL PI INDICATOR 6.5 (GLOVE) ×1
GLOVE BIOGEL PI INDICATOR 7.0 (GLOVE) ×1
GLOVE BIOGEL PI INDICATOR 7.5 (GLOVE) ×1
GOWN STRL REUS W/ TWL LRG LVL3 (GOWN DISPOSABLE) ×9 IMPLANT
GOWN STRL REUS W/TWL LRG LVL3 (GOWN DISPOSABLE) ×12
GUIDEWIRE 3.2X400 (WIRE) ×1 IMPLANT
K-WIRE 1.6X150 (WIRE) ×4
K-WIRE 2.0 (WIRE) ×4
K-WIRE FXSTD 280X2XNS SS (WIRE) ×3
KIT BASIN OR (CUSTOM PROCEDURE TRAY) ×4 IMPLANT
KIT TURNOVER KIT B (KITS) ×4 IMPLANT
KWIRE 1.6X150 (WIRE) IMPLANT
KWIRE FXSTD 280X2XNS SS (WIRE) IMPLANT
MANIFOLD NEPTUNE II (INSTRUMENTS) ×4 IMPLANT
NAIL TIB TFNA 10X300 (Nail) ×1 IMPLANT
NEEDLE 22X1 1/2 (OR ONLY) (NEEDLE) IMPLANT
NS IRRIG 1000ML POUR BTL (IV SOLUTION) ×4 IMPLANT
PACK TOTAL JOINT (CUSTOM PROCEDURE TRAY) ×4 IMPLANT
PAD ARMBOARD 7.5X6 YLW CONV (MISCELLANEOUS) ×7 IMPLANT
PAD CAST 3X4 CTTN HI CHSV (CAST SUPPLIES) IMPLANT
PAD CAST 4YDX4 CTTN HI CHSV (CAST SUPPLIES) ×3 IMPLANT
PADDING CAST COTTON 3X4 STRL (CAST SUPPLIES) ×4
PADDING CAST COTTON 4X4 STRL (CAST SUPPLIES) ×4
PADDING CAST COTTON 6X4 STRL (CAST SUPPLIES) ×4 IMPLANT
PLATE DIST FEM 12H (Plate) ×1 IMPLANT
PLATE VOL RADIUS 2.4NAR 6/3H (Orthopedic Implant) IMPLANT
REAMER ROD 3.8 BALL TIP 3X950 (ORTHOPEDIC DISPOSABLE SUPPLIES) ×1 IMPLANT
SCREW 5.0 32MM (Screw) ×2 IMPLANT
SCREW 5.0 70MM (Screw) ×1 IMPLANT
SCREW 5.0 80MM (Screw) ×1 IMPLANT
SCREW CORTEX 2.4X16MM (Screw) ×1 IMPLANT
SCREW LOCK IM 60X5XNS LOPRFL (Screw) IMPLANT
SCREW LOCK IM NAIL 5X60 (Screw) ×4 IMPLANT
SCREW LOCK LP 5X40 (Screw) ×1 IMPLANT
SCREW LOCK LP THRD 5X32 NS (Screw) ×2 IMPLANT
SCREW LOCKING 2.4 VA 14MM (Screw) ×1 IMPLANT
SCREW NCB 3.5X75X5X6.2XST (Screw) IMPLANT
SCREW NCB 4.0MX34M (Screw) ×1 IMPLANT
SCREW NCB 5.0X75MM (Screw) ×12 IMPLANT
SCREW SELF TAP 12M (Screw) ×3 IMPLANT
SCREW VA LOCKING 2.4X16 (Screw) ×1 IMPLANT
SCREW VA LOCKING 2.4X18 (Screw) ×2 IMPLANT
SPLINT PLASTER CAST XFAST 4X15 (CAST SUPPLIES) IMPLANT
SPLINT PLASTER XTRA FAST SET 4 (CAST SUPPLIES) ×1
STAPLER VISISTAT 35W (STAPLE) ×4 IMPLANT
STOCKINETTE TUBULAR 6 INCH (GAUZE/BANDAGES/DRESSINGS) ×1 IMPLANT
STRIP CLOSURE SKIN 1/2X4 (GAUZE/BANDAGES/DRESSINGS) ×1 IMPLANT
SUCTION FRAZIER HANDLE 10FR (MISCELLANEOUS) ×8
SUCTION TUBE FRAZIER 10FR DISP (MISCELLANEOUS) ×3 IMPLANT
SUT ETHILON 3 0 PS 1 (SUTURE) ×6 IMPLANT
SUT MNCRL AB 3-0 PS2 18 (SUTURE) ×5 IMPLANT
SUT VIC AB 0 CT1 27 (SUTURE) ×4
SUT VIC AB 0 CT1 27XBRD ANBCTR (SUTURE) IMPLANT
SUT VIC AB 1 CT1 27 (SUTURE)
SUT VIC AB 1 CT1 27XBRD ANBCTR (SUTURE) IMPLANT
SUT VIC AB 2-0 CT1 27 (SUTURE) ×8
SUT VIC AB 2-0 CT1 TAPERPNT 27 (SUTURE) ×6 IMPLANT
SYR CONTROL 10ML LL (SYRINGE) ×4 IMPLANT
TOWEL GREEN STERILE (TOWEL DISPOSABLE) ×8 IMPLANT
TOWEL GREEN STERILE FF (TOWEL DISPOSABLE) ×4 IMPLANT
TRAY FOLEY MTR SLVR 16FR STAT (SET/KITS/TRAYS/PACK) ×1 IMPLANT
TUBE CONNECTING 12X1/4 (SUCTIONS) ×4 IMPLANT
WATER STERILE IRR 1000ML POUR (IV SOLUTION) ×8 IMPLANT
YANKAUER SUCT BULB TIP NO VENT (SUCTIONS) ×1 IMPLANT

## 2021-08-29 NOTE — ED Triage Notes (Addendum)
Pt BIB [facility] from home d/t a fall. Pt was walking down the steps using her cain, bear some weight on the trashed can w wheels which slipped off the road resulted in a fall. Open R tib fracture noted. Closed L shoulder fracture. Possible L knee fracture. Pulses intact.Did not hit hear head or LOC.  2g Ancef given by EMS. A&O X4. VSS.

## 2021-08-29 NOTE — Anesthesia Procedure Notes (Signed)
Procedure Name: Intubation Date/Time: 08/29/2021 3:37 PM  Performed by: Dorann Lodge, CRNAPre-anesthesia Checklist: Patient identified, Emergency Drugs available, Suction available and Patient being monitored Patient Re-evaluated:Patient Re-evaluated prior to induction Oxygen Delivery Method: Circle System Utilized Preoxygenation: Pre-oxygenation with 100% oxygen Induction Type: IV induction Ventilation: Mask ventilation without difficulty Laryngoscope Size: Mac and 3 Grade View: Grade I Tube type: Oral Tube size: 7.0 mm Number of attempts: 1 Airway Equipment and Method: Stylet Placement Confirmation: ETT inserted through vocal cords under direct vision, positive ETCO2 and breath sounds checked- equal and bilateral Secured at: 22 cm Tube secured with: Tape Dental Injury: Teeth and Oropharynx as per pre-operative assessment

## 2021-08-29 NOTE — Progress Notes (Signed)
Orthopedic Tech Progress Note Patient Details:  Kendra Clay 02-11-1960 [id number]  Ortho Devices Type of Ortho Device: CAM walker Ortho Device/Splint Location: RLE Ortho Device/Splint Interventions: Application   Post Interventions Patient Tolerated: Well  Kendra Clay Kendra Clay 08/29/2021, 7:06 PM

## 2021-08-29 NOTE — H&P (View-Only) (Signed)
Reason for Consult:Polytrauma Referring Physician: Charlesetta Shanks Time called: [id number] Time at bedside: [place] is an 61 y.o. female.  HPI: Kendra Clay was coming in through her garage. She put a hand on the garbage can for support and it rolled out from under her and she fell. She had immediate pain in her left arm and leg as well as her right leg and was bleeding. She was brought to the ED where x-rays showed multiple fxs and orthopedic surgery was consulted. She generally ambulates with a cane and lives with her son. She is RHD. She has had a diarrheal illness for the past few day and, though she has felt weak, does not think it contributed to the fall. She is on HD MWF.  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 Dr Deatra Ina 30 years ago. 2D ECHO  30 yearsa go.   History of blood transfusion    C- Section   History of bronchitis    numerous, last time> 1 year   Hypertension    Night muscle spasms    legs   Polycystic kidney disease    Genetic dx 23 years ago   PONV (postoperative nausea and vomiting)    patch helped   Scoliosis    Strabismus     Past Surgical History:  Procedure Laterality Date   ABDOMINAL HYSTERECTOMY     AV FISTULA PLACEMENT  09/01/2011   Procedure: ARTERIOVENOUS (AV) FISTULA CREATION;  Surgeon: Rosetta Posner, MD;  Location: [place];  Service: Vascular;  Laterality: Right;   [place]     for lazy eye   TIBIA IM NAIL INSERTION Left 05/19/2018   Procedure: INTRAMEDULLARY (IM) NAIL TIBIAL;  Surgeon: Hiram Gash, MD;  Location: [facility];  Service: Orthopedics;  Laterality: Left;    Family History  Problem Relation Age of Onset   Arthritis Mother    Kidney disease Father    Polycystic kidney disease Son    Asthma Son    Hypertension Maternal Grandmother     Social History:  reports that she quit smoking about 6 years ago. Her  smoking use included cigarettes. She started smoking about 41 years ago. She has a 45.00 pack-year smoking history. She quit smokeless tobacco use about 7 years ago. She reports that she does not drink alcohol and does not use drugs.  Allergies:  Allergies  Allergen Reactions   E-Mycin [Erythromycin] Hives   Sulfa Antibiotics Rash    Other reaction(s): Skin Rash Other reaction(s): Skin Rash    Lisinopril Cough    Medications: I have reviewed the patient's current medications.  No results found for this or any previous visit (from the past 48 hour(s)).  DG Knee Left Port  Result Date: 08/29/2021 CLINICAL DATA:  Fall, pain EXAM: PORTABLE LEFT KNEE - 1-2 VIEW COMPARISON:  None Available. FINDINGS: Generalized osteopenia. Comminuted fracture of the distal femoral metaphysis with apex anterior angulation and posterior angulation. No aggressive osseous lesion. Normal alignment. Intramedullary nail in the proximal tibia. Soft tissue are unremarkable. No radiopaque foreign body or soft tissue emphysema. IMPRESSION: 1. Generalized osteopenia. Comminuted fracture of the distal femoral metaphysis with apex anterior angulation and posterior angulation. Electronically Signed   By: Kathreen Devoid M.D.   On: 08/29/2021 13:18   DG Chest Port 1  View  Result Date: 08/29/2021 CLINICAL DATA:  [place]. EXAM: PORTABLE CHEST 1 VIEW COMPARISON:  04/24/2020 FINDINGS: The cardiac silhouette, mediastinal and hilar contours are within normal limits given the AP projection and portable technique. Chronic emphysematous changes and pulmonary scarring but no acute overlying pulmonary process. No pleural effusions or pneumothorax. The bony thorax is grossly intact. No obvious acute rib fractures. Stable thoracic spine scoliosis. IMPRESSION: Chronic lung changes but no acute pulmonary findings. Electronically Signed   By: Marijo Sanes M.D.   On: 08/29/2021 13:13   DG Forearm Left  Result Date: 08/29/2021 CLINICAL DATA:  Fall,  pain EXAM: LEFT FOREARM - 2 VIEW COMPARISON:  None Available. FINDINGS: There are transverse fractures through the distal radial and ulnar metaphyses. There is dorsal displacement of the distal fragments with approximately 1.2 cm dorsal displacement and 1.2 cm overlap of the distal radial fragment. There is mild radial displacement and angulation of the distal fragments. There is overlying soft tissue swelling. IMPRESSION: Displaced transverse fractures through the distal radial and ulnar metaphyses as above. Electronically Signed   By: Valetta Mole M.D.   On: 08/29/2021 13:10   DG Tibia/Fibula Right Port  Result Date: 08/29/2021 CLINICAL DATA:  Fall from standing position.  Pain. EXAM: PORTABLE RIGHT TIBIA AND FIBULA - 2 VIEW COMPARISON:  None Available. FINDINGS: Comminuted transverse tibia and fibular fractures are noted in the midshaft. Lateral and posterior displacement is noted. The knee and ankle joints are located. IMPRESSION: 1. Comminuted transverse midshaft tibia and fibular fractures as described. 2. Lateral and posterior displacement. Electronically Signed   By: San Morelle M.D.   On: 08/29/2021 13:08    Review of Systems  HENT:  Negative for ear discharge, ear pain, hearing loss and tinnitus.   Eyes:  Negative for photophobia and pain.  Respiratory:  Negative for cough and shortness of breath.   Cardiovascular:  Negative for chest pain.  Gastrointestinal:  Negative for abdominal pain, nausea and vomiting.  Genitourinary:  Negative for dysuria, flank pain, frequency and urgency.  Musculoskeletal:  Positive for arthralgias (Right tibia, left wrist and knee). Negative for back pain, myalgias and neck pain.  Neurological:  Negative for dizziness and headaches.  Hematological:  Does not bruise/bleed easily.  Psychiatric/Behavioral:  The patient is not nervous/anxious.    Blood pressure (!) 160/78, pulse 66, temperature 97.8 F (36.6 C), temperature source Oral, resp. rate 19,  SpO2 94 %. Physical Exam Constitutional:      General: She is not in acute distress.    Appearance: She is well-developed. She is not diaphoretic.  HENT:     Head: Normocephalic and atraumatic.  Eyes:     General: No scleral icterus.       Right eye: No discharge.        Left eye: No discharge.     Conjunctiva/sclera: Conjunctivae normal.  Cardiovascular:     Rate and Rhythm: Normal rate and regular rhythm.  Pulmonary:     Effort: Pulmonary effort is normal. No respiratory distress.  Musculoskeletal:     Cervical back: Normal range of motion.     Comments: Left shoulder, elbow, wrist, digits- no skin wounds, wrist swollen and deformed, mod TTP, no instability, no blocks to motion  Sens  Ax/R/M/U intact  Mot   Ax/ R/ PIN/ M/ AIN/ U intact  Rad 1+  RLE No traumatic wounds, ecchymosis, or rash  Lower leg deformity, punctate wound anterior shin  No knee or ankle effusion  Sens DPN, SPN,  TN intact  Motor EHL, ext, flex, evers 5/5  DP 1+, PT 0, No significant edema  LLE No traumatic wounds, ecchymosis, or rash  TTP knee  No ankle effusion  Sens DPN, SPN, TN intact  Motor EHL, ext, flex, evers 5/5  DP 1+, PT 0, No significant edema  Skin:    General: Skin is warm and dry.  Neurological:     Mental Status: She is alert.  Psychiatric:        Mood and Affect: Mood normal.        Behavior: Behavior normal.     Assessment/Plan: Left wrist fx -- Plan ORIF today by Dr. Doreatha Martin Right open tib/fib fx -- I&D, IMN Left distal femur fx -- ORIF    Lisette Abu, PA-C Orthopedic Surgery [phone] 08/29/2021, 1:33 PM

## 2021-08-29 NOTE — Op Note (Signed)
Orthopaedic Surgery Operative Note (CSN: [id number] ) Date of Surgery: 08/29/2021  Admit Date: 08/29/2021   Diagnoses: Pre-Op Diagnoses: Left supracondylar distal femur fracture Right open tibial shaft fracture Left distal radius and ulna fracture  Post-Op Diagnosis: Same  Procedures: CPT 27511-Open reduction internal fixation of left distal femur CPT 27759-Intramedullary nailing of right tibial shaft fracture CPT 25607-Open reduction internal fixation of left distal radius fracture CPT 25545-Percutaneous fixation of left distal ulna fracture CPT 11012-Irrigation and debridement of right open tibia fracture  Surgeons : Primary: Shona Needles, MD  Assistant: Patrecia Pace, PA-C  Location: OR 9   Anesthesia:General   Antibiotics: Ancef 2g preop with 1 gm vancomycin powder placed topically in each surgical incision   Tourniquet time: None used    Estimated Blood Loss: 097 mL  Complications:None  Specimens:None   Implants: Implant Name Type Inv. Item Serial No. Manufacturer Lot No. LRB No. Used Action  CAP LOCK NCB - [id number] Cap CAP LOCK NCB  [employer](ORTH,TRAU,BIO,SG)  Left 6 Implanted  SCREW 5.0 70MM - [id number] Screw SCREW 5.0 70MM  [employer](ORTH,TRAU,BIO,SG)  Left 1 Implanted  SCREW NCB 5.0X75MM - [id number] Screw SCREW NCB 5.0X75MM  [employer](ORTH,TRAU,BIO,SG)  Left 3 Implanted  SCREW 5.0 32MM - [id number] Screw SCREW 5.0 32MM  [employer](ORTH,TRAU,BIO,SG)  Left 2 Implanted  SCREW 5.0 80MM - [id number] Screw SCREW 5.0 80MM  [employer](ORTH,TRAU,BIO,SG)  Left 1 Implanted  SCREW NCB 4.0MX34M - [id number] Screw SCREW NCB 4.0MX34M  [employer](ORTH,TRAU,BIO,SG)  Left 1 Implanted  PLATE DIST FEM 88F - [id number] Plate PLATE DIST FEM 86V  [employer](ORTH,TRAU,BIO,SG)  Left 1 Implanted  NAIL TIB TFNA 10X300 - [id number] Nail NAIL TIB TFNA 10X300  [employer] [id number] Right 1 Implanted  SCREW LOCK LP THRD 5X32 NS - [id number] Screw SCREW LOCK LP THRD 5X32 NS   [employer]PU[employer] ORTH[employer]  Right 2 Implanted  SCREW LOCK LP 5X40 - [id number] Screw SCREW LOCK LP 5X40  DEPU[employer] ORTH[employer]  Right 1 Implanted  SCREW LOCK IM NAIL 5X60 - [id number] Screw SCREW LOCK IM NAIL 5X60  DEPU[employer] ORTH[employer]  Right 1 Implanted  SCREW SELF TAP 36M - [id number] Screw SCREW SELF TAP 36M  DEPU[employer] ORTH[employer]  Left 3 Implanted  DISTAL RADIUS VOL 2.4NAR 6/3H - [id number] Orthopedic Implant DISTAL RADIUS VOL 2.4NAR 6/3H  [employer] ORTH[employer]  Left 1 Implanted  SCREW LOCKING 2.4 VA 14MM - [id number] Screw SCREW LOCKING 2.4 VA 14MM  DE[employer] ORTH[employer]  Left 1 Implanted  SCREW VA LOCKING 2.4X16 - [id number] Screw SCREW VA LOCKING 2.4X16  DEPU[employer] ORTH[employer]  Left 1 Implanted  SCREW VA LOCKING 2.4X18 - [id number] Screw SCREW VA LOCKING 2.4X18  DEPU[employer] ORTH[employer]  Left 2 Implanted  SCREW CORTEX 2.4X16MM - [id number] Screw SCREW CORTEX 2.4X16MM  DEPU[employer] ORTH[employer]  Left 1 Implanted     Indications for Surgery: 61 year old female with end-stage renal disease on dialysis who sustained a fall with a left supracondylar distal femur fracture, right open tibia and fibula fracture, left distal radius and ulna fractures.  Due to the unstable nature of her injuries I recommend proceeding with open reduction internal fixation of her left distal femur and left upper extremity, and a irrigation debridement with intramedullary nailing of her right tibia shaft fracture.  Risks and benefits were discussed with the patient.  Risks included but not limited to bleeding, infection, malunion, nonunion, hardware failure, hardware irritation, nerve and blood vessel injury, DVT, even the possibility anesthetic complications.  She agreed to proceed with surgery and consent  was obtained.  Operative Findings: 1.  Open reduction internal fixation of left supracondylar distal femur fracture using [employer] NCB distal femoral locking plate 2.  Irrigation and debridement of right open tibial shaft fracture type II with intramedullary  nailing using Synthes TNA 10 x 300 mm nail 3.  Open reduction internal fixation of left distal radius fracture using Synthes cross lock volar distal radius plate 4.  Percutaneous fixation of left distal ulna fracture using percutaneous 1.6 mm K wire.  Procedure: The patient was identified in the preoperative holding area. Consent was confirmed with the patient and their family and all questions were answered. The operative extremity was marked after confirmation with the patient. she was then brought back to the operating room by our anesthesia colleagues.  She was placed under general anesthetic and carefully transferred over to radiolucent flat top table.  The bilateral lower extremities and the left upper extremity were prepped and draped in usual sterile fashion.  A timeout was performed to verify the patient, the procedure, and the extremities.  Preoperative antibiotics were dosed.  I for started out with the left lower extremity.  The hip and knee were flexed over a triangle.  Fluoroscopic imaging showed the unstable nature of her injury.  A direct lateral approach to the distal femur was carried down through skin and subcutaneous tissue.  I incised through the IT band and mobilized the vastus lateralis to expose the distal femur.  I was able to access the intra-articular portion of the joint to palpate for intra-articular split.  I did not feel that there was any intra-articular extension.  Reduction maneuver was performed with traction and manipulation of the triangle.  I then used a 12 hole [employer] NCB distal femoral locking plate and attached to a targeting arm and slid it submuscularly along the lateral cortex of the femur.  I aligned the distal portion of plate and held it provisionally with a 2.0 mm K wire.  Proximally I percutaneously placed a 3.3 mm drill bit gaining bicortical fixation.  I returned to the distal segment and placed a 5.0 millimeter screws to bring the distal portion of  plate flush to bone.  I then placed 5.0 millimeter screws into the femoral shaft.  I then exchanged the 3.3 mm drill bit for a 4.0 millimeter screw.  I placed locking caps on the 5.0 millimeter screws in the femoral shaft and removed the targeting arm.  I then placed 3 more 5.0 millimeter screws distally and placed locking caps on all 5 of the screws.  Final fluoroscopic imaging was obtained.  The incision was copiously irrigated.  A gram of vancomycin powder was placed into the incision.  A layered closure of 0 Vicryl, 2-0 Vicryl 3-0 Monocryl and Dermabond was used to close the skin.  I then turned my attention to the right lower extremity.  There were 2 small punctate wounds from where the bone had violated the skin.  I extended these into approximately 5 cm laceration.  I then delivered the tibial shaft through the skin and proceeded to debride it.  I irrigated with low-pressure pulsatile lavage.  A total of 3 L were used.  I then changed gloves and instruments and turned my attention to the nailing portion of the procedure.  A lateral parapatellar incision was then made and carried down through skin and subcutaneous tissue.  I incised through the retinaculum to mobilize the patella medially.  I then used fluoroscopic guidance to place a  threaded guidewire into the proximal metaphysis.  I then used a entry reamer to enter the medullary canal.  I passed a ball-tipped guidewire down the center of the canal and seated into the distal metaphysis.  I then measured the length and chose to use a 300 mm nail.  I then sequentially reamed from 8 mm to 11 mm and decided to place a 10 mm nail.  The 10 x 300 mm nail was placed down the center of the canal.  I then used perfect circle technique to place 2 medial to lateral distal interlocking screws.  I then used the targeting arm proximally to place 2 proximal interlocking screws.  The targeting arm was removed.  Final fluoroscopic imaging was obtained.  The incisions  were copiously irrigated.  A gram of vancomycin powder was split between the traumatic laceration and the surgical incision.  A layered closure of 0 Vicryl, 2-0 Vicryl 3-0 Monocryl for the surgical incisions and 3-0 nylon for the traumatic laceration.  Sterile dressing was applied and then I turned my attention to the left upper extremity.  Fluoroscopic imaging showed the unstable nature of her injury.  A standard FCR approach to the distal radius was carried down through skin and subcutaneous tissue.  I incised through the volar fascia of the FCR mobilized the tendon and incised through the dorsal sheath as well.  I swept the finger flexors out of the way and released the pronator quadratus off of the distal radius.  I reduced the fracture and chose a Synthes cross lock volar distal radius plate and positioned it and held it provisionally with K wires.  I then placed a nonlocking screw into the radial shaft.  I then placed a nonlocking screw into the distal metaphysis to bring the plate flush to bone and confirmed reduction with fluoroscopy.  I then placed locking screws in the distal segment and then placed 2 more nonlocking screws in the radial shaft.  Fluoroscopic imaging showed adequate reduction and stabilization of the radius.  There was some mobility to the ulna lined up well but I felt that some fixation would be warranted.  I was able to place a 1.6 mm K wire percutaneously at the ulnar head and directed across the fracture gaining purchase in the proximal ulnar shaft to hold the reduction.  Final fluoroscopic imaging was then obtained.  The incision was copiously irrigated.  A gram of vancomycin powder was placed into the incision.  A layered closure of 2-0 Vicryl 3-0 Monocryl and Steri-Strips were used to close the incision.  A sterile dressing was applied.  A well-padded volar resting splint was placed.  The patient was then awoke from anesthesia and taken to the PACU in stable condition.    Debridement type: Excisional Debridement  Side: right  Body Location: Tibia  Tools used for debridement: scalpel, scissors, curette, and rongeur  Pre-debridement Wound size (cm):   Length: 1        Width: 1     Depth: 0.5   Post-debridement Wound size (cm):  N/A closed  Debridement depth beyond dead/damaged tissue down to healthy viable tissue: yes  Tissue layer involved: skin, subcutaneous tissue, muscle / fascia, bone  Nature of tissue removed: Devitalized Tissue  Irrigation volume: 3L     Irrigation fluid type: Normal Saline   Post Op Plan/Instructions: The patient be weightbearing as tolerated to the right lower extremity and left lower extremity.  She may be weightbearing as tolerated through her elbow on  the left side but nonweightbearing through her wrist.  She will receive postoperative Ancef for open fracture prophylaxis.  She may be started on a DOAC for DVT prophylaxis once her hemoglobin stabilizes.  We will have her mobilize with physical and Occupational Therapy.  I was present and performed the entire surgery.  Patrecia Pace, PA-C did assist me throughout the case. An assistant was necessary given the difficulty in approach, maintenance of reduction and ability to instrument the fracture.   Katha Hamming, MD [facility]

## 2021-08-29 NOTE — Transfer of Care (Signed)
Immediate Anesthesia Transfer of Care Note  Patient: Kendra Clay  Procedure(s) Performed: INTRAMEDULLARY (IM) NAIL TIBIAL WITH IRRIGATION AND DEBRIDMENT (Right: Leg Lower) OPEN REDUCTION INTERNAL FIXATION (ORIF) DISTAL FEMUR FRACTURE (Left: Leg Lower) OPEN REDUCTION INTERNAL FIXATION (ORIF) WRIST FRACTURE (Left: Wrist)  Patient Location: PACU  Anesthesia Type:General  Level of Consciousness: drowsy and patient cooperative  Airway & Oxygen Therapy: Patient Spontanous Breathing and Patient connected to nasal cannula oxygen  Post-op Assessment: Report given to RN, Post -op Vital signs reviewed and stable and Patient moving all extremities  Post vital signs: Reviewed and stable  Last Vitals:  Vitals Value Taken Time  BP 189/79 08/29/21 1810  Temp    Pulse 75 08/29/21 1812  Resp 26 08/29/21 1812  SpO2 99 % 08/29/21 1812  Vitals shown include unvalidated device data.  Last Pain:  Vitals:   08/29/21 1436  TempSrc: Oral  PainSc: 6          Complications: No notable events documented.

## 2021-08-29 NOTE — Anesthesia Preprocedure Evaluation (Addendum)
Anesthesia Evaluation  Patient identified by MRN, date of birth, ID band Patient awake    Reviewed: Allergy & Precautions, NPO status , Patient's Chart, lab work & pertinent test results  History of Anesthesia Complications (+) PONV and history of anesthetic complications  Airway Mallampati: II  TM Distance: >3 FB     Dental   Pulmonary COPD, former smoker,    breath sounds clear to auscultation       Cardiovascular hypertension,  Rhythm:Regular Rate:Normal     Neuro/Psych    GI/Hepatic negative GI ROS, Neg liver ROS,   Endo/Other    Renal/GU Renal disease     Musculoskeletal   Abdominal   Peds  Hematology   Anesthesia Other Findings   Reproductive/Obstetrics                             Anesthesia Physical Anesthesia Plan  ASA: 3  Anesthesia Plan: General   Post-op Pain Management:    Induction: Intravenous  PONV Risk Score and Plan: 4 or greater and Ondansetron and Dexamethasone  Airway Management Planned: Oral ETT  Additional Equipment:   Intra-op Plan:   Post-operative Plan: Possible Post-op intubation/ventilation  Informed Consent: I have reviewed the patients History and Physical, chart, labs and discussed the procedure including the risks, benefits and alternatives for the proposed anesthesia with the patient or authorized representative who has indicated his/her understanding and acceptance.     Dental advisory given  Plan Discussed with: CRNA and Anesthesiologist  Anesthesia Plan Comments:         Anesthesia Quick Evaluation

## 2021-08-29 NOTE — Progress Notes (Signed)
PHARMACY NOTE:  ANTIMICROBIAL RENAL DOSAGE ADJUSTMENT  Current antimicrobial regimen includes a mismatch between antimicrobial dosage and estimated renal function.  As per policy approved by the Pharmacy & Therapeutics and Medical Executive Committees, the antimicrobial dosage will be adjusted accordingly.  Current antimicrobial dosage:  Ancef 1gm IV q8h  Indication: surgical prophylaxis  Renal Function:  Estimated Creatinine Clearance: 7.5 mL/min (A) (by C-G formula based on SCr of 6.59 mg/dL (H)). [x]      On intermittent HD, scheduled: MWF []      On CRRT    Antimicrobial dosage has been changed to:  Ancef IV 1gm x 1 (for a total 2gm post-op dose).  No additional dosing needed given ESRD.    Thank you for allowing pharmacy to be a part of this patient's care.  Elmer Ramp, [facility] 08/29/2021 8:42 PM

## 2021-08-29 NOTE — Consult Note (Signed)
Reason for Consult:Polytrauma Referring Physician: Charlesetta Shanks Time called: 2409 Time at bedside: Kendra Clay is an 61 y.o. female.  HPI: [place] was coming in through her garage. She put a hand on the garbage can for support and it rolled out from under her and she fell. She had immediate pain in her left arm and leg as well as her right leg and was bleeding. She was brought to the ED where x-rays showed multiple fxs and orthopedic surgery was consulted. She generally ambulates with a cane and lives with her son. She is RHD. She has had a diarrheal illness for the past few day and, though she has felt weak, does not think it contributed to the fall. She is on HD MWF.  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 Dr Deatra Ina 30 years ago. 2D ECHO  30 yearsa go.   History of blood transfusion    C- Section   History of bronchitis    numerous, last time> 1 year   Hypertension    Night muscle spasms    legs   Polycystic kidney disease    Genetic dx 23 years ago   PONV (postoperative nausea and vomiting)    patch helped   Scoliosis    Strabismus     Past Surgical History:  Procedure Laterality Date   ABDOMINAL HYSTERECTOMY     AV FISTULA PLACEMENT  09/01/2011   Procedure: ARTERIOVENOUS (AV) FISTULA CREATION;  Surgeon: Rosetta Posner, MD;  Location: [place];  Service: Vascular;  Laterality: Right;   [place]     for lazy eye   TIBIA IM NAIL INSERTION Left 05/19/2018   Procedure: INTRAMEDULLARY (IM) NAIL TIBIAL;  Surgeon: Hiram Gash, MD;  Location: [place];  Service: Orthopedics;  Laterality: Left;    Family History  Problem Relation Age of Onset   Arthritis Mother    Kidney disease Father    Polycystic kidney disease Son    Asthma Son    Hypertension Maternal Grandmother     Social History:  reports that she quit smoking about 6 years ago. Her  smoking use included cigarettes. She started smoking about 41 years ago. She has a 45.00 pack-year smoking history. She quit smokeless tobacco use about 7 years ago. She reports that she does not drink alcohol and does not use drugs.  Allergies:  Allergies  Allergen Reactions   E-Mycin [Erythromycin] Hives   Sulfa Antibiotics Rash    Other reaction(s): Skin Rash Other reaction(s): Skin Rash    Lisinopril Cough    Medications: I have reviewed the patient's current medications.  No results found for this or any previous visit (from the past 48 hour(s)).  DG Knee Left Port  Result Date: 08/29/2021 CLINICAL DATA:  Fall, pain EXAM: PORTABLE LEFT KNEE - 1-2 VIEW COMPARISON:  None Available. FINDINGS: Generalized osteopenia. Comminuted fracture of the distal femoral metaphysis with apex anterior angulation and posterior angulation. No aggressive osseous lesion. Normal alignment. Intramedullary nail in the proximal tibia. Soft tissue are unremarkable. No radiopaque foreign body or soft tissue emphysema. IMPRESSION: 1. Generalized osteopenia. Comminuted fracture of the distal femoral metaphysis with apex anterior angulation and posterior angulation. Electronically Signed   By: Kathreen Devoid M.D.   On: 08/29/2021 13:18   DG Chest Port 1  View  Result Date: 08/29/2021 CLINICAL DATA:  [place]. EXAM: PORTABLE CHEST 1 VIEW COMPARISON:  04/24/2020 FINDINGS: The cardiac silhouette, mediastinal and hilar contours are within normal limits given the AP projection and portable technique. Chronic emphysematous changes and pulmonary scarring but no acute overlying pulmonary process. No pleural effusions or pneumothorax. The bony thorax is grossly intact. No obvious acute rib fractures. Stable thoracic spine scoliosis. IMPRESSION: Chronic lung changes but no acute pulmonary findings. Electronically Signed   By: Marijo Sanes M.D.   On: 08/29/2021 13:13   DG Forearm Left  Result Date: 08/29/2021 CLINICAL DATA:  Fall,  pain EXAM: LEFT FOREARM - 2 VIEW COMPARISON:  None Available. FINDINGS: There are transverse fractures through the distal radial and ulnar metaphyses. There is dorsal displacement of the distal fragments with approximately 1.2 cm dorsal displacement and 1.2 cm overlap of the distal radial fragment. There is mild radial displacement and angulation of the distal fragments. There is overlying soft tissue swelling. IMPRESSION: Displaced transverse fractures through the distal radial and ulnar metaphyses as above. Electronically Signed   By: Valetta Mole M.D.   On: 08/29/2021 13:10   DG Tibia/Fibula Right Port  Result Date: 08/29/2021 CLINICAL DATA:  Fall from standing position.  Pain. EXAM: PORTABLE RIGHT TIBIA AND FIBULA - 2 VIEW COMPARISON:  None Available. FINDINGS: Comminuted transverse tibia and fibular fractures are noted in the midshaft. Lateral and posterior displacement is noted. The knee and ankle joints are located. IMPRESSION: 1. Comminuted transverse midshaft tibia and fibular fractures as described. 2. Lateral and posterior displacement. Electronically Signed   By: San Morelle M.D.   On: 08/29/2021 13:08    Review of Systems  HENT:  Negative for ear discharge, ear pain, hearing loss and tinnitus.   Eyes:  Negative for photophobia and pain.  Respiratory:  Negative for cough and shortness of breath.   Cardiovascular:  Negative for chest pain.  Gastrointestinal:  Negative for abdominal pain, nausea and vomiting.  Genitourinary:  Negative for dysuria, flank pain, frequency and urgency.  Musculoskeletal:  Positive for arthralgias (Right tibia, left wrist and knee). Negative for back pain, myalgias and neck pain.  Neurological:  Negative for dizziness and headaches.  Hematological:  Does not bruise/bleed easily.  Psychiatric/Behavioral:  The patient is not nervous/anxious.    Blood pressure (!) 160/78, pulse 66, temperature 97.8 F (36.6 C), temperature source Oral, resp. rate 19,  SpO2 94 %. Physical Exam Constitutional:      General: She is not in acute distress.    Appearance: She is well-developed. She is not diaphoretic.  HENT:     Head: Normocephalic and atraumatic.  Eyes:     General: No scleral icterus.       Right eye: No discharge.        Left eye: No discharge.     Conjunctiva/sclera: Conjunctivae normal.  Cardiovascular:     Rate and Rhythm: Normal rate and regular rhythm.  Pulmonary:     Effort: Pulmonary effort is normal. No respiratory distress.  Musculoskeletal:     Cervical back: Normal range of motion.     Comments: Left shoulder, elbow, wrist, digits- no skin wounds, wrist swollen and deformed, mod TTP, no instability, no blocks to motion  Sens  Ax/R/M/U intact  Mot   Ax/ R/ PIN/ M/ AIN/ U intact  Rad 1+  RLE No traumatic wounds, ecchymosis, or rash  Lower leg deformity, punctate wound anterior shin  No knee or ankle effusion  Sens DPN, SPN,  TN intact  Motor EHL, ext, flex, evers 5/5  DP 1+, PT 0, No significant edema  LLE No traumatic wounds, ecchymosis, or rash  TTP knee  No ankle effusion  Sens DPN, SPN, TN intact  Motor EHL, ext, flex, evers 5/5  DP 1+, PT 0, No significant edema  Skin:    General: Skin is warm and dry.  Neurological:     Mental Status: She is alert.  Psychiatric:        Mood and Affect: Mood normal.        Behavior: Behavior normal.     Assessment/Plan: Left wrist fx -- Plan ORIF today by Dr. Doreatha Martin Right open tib/fib fx -- I&D, IMN Left distal femur fx -- ORIF    Lisette Abu, PA-C Orthopedic Surgery [phone] 08/29/2021, 1:33 PM

## 2021-08-29 NOTE — ED Provider Notes (Signed)
[facility] EMERGENCY DEPARTMENT Provider Note   CSN: [id number] Arrival date & time: 08/29/21  1147     History  Chief Complaint  Patient presents with   [name]    Kendra Clay is a 61 y.o. female.  HPI Past medical history significant for ESRD on dialysis.  Patient was going out into her garage down steps and lost her balance.  At baseline she uses her cane for assisted balance, she tried to support herself on a nearby trash can that rolled out from underneath her causing her to fall to the ground.  She reports she fell because she lost her balance and needed to hold onto something.  She denies that she felt that she was passing out.  There was no associated chest pain or shortness of breath precipitating the event.  When she had fallen she had severe injury to her right lower leg with inability to move it and displacement as well as significant injury to her left wrist with displacement and a lot of pain with movement.  Patient was given Ancef 2 g by EMS.  Patient denies that she struck her head or has headache.  She did not have loss of consciousness.  Patient has significant chronic baseline illness but felt that she was at her baseline for health and has been compliant with her dialysis.  No recent issues    Home Medications Prior to Admission medications   Medication Sig Start Date End Date Taking? Authorizing Provider  acetaminophen (TYLENOL) 500 MG tablet Take 2 tablets (1,000 mg total) by mouth every 8 (eight) hours. Patient taking differently: Take 1,000 mg by mouth every 8 (eight) hours as needed for mild pain or headache. 05/20/18   Roxan Hockey, MD  atorvastatin (LIPITOR) 40 MG tablet Take 40 mg by mouth at bedtime.    [provider]  B Complex-C-Zn-Folic Acid (DIALYVITE 696 WITH ZINC) 0.8 MG TABS Take 1 tablet by mouth at bedtime. 08/30/18   [provider]  carvedilol (COREG) 12.5 MG tablet Take 12.5 mg by mouth 2 (two) times daily.  06/11/17   [provider]  estradiol (ESTRACE) 0.1 MG/GM vaginal cream Place 1 Applicatorful vaginally at bedtime as needed (yeast infection). 03/30/20   [provider]  ferric citrate (AURYXIA) 1 GM 210 MG(Fe) tablet Take 210 mg by mouth 3 (three) times daily with meals.    [provider]  isosorbide mononitrate (IMDUR) 30 MG 24 hr tablet Take 30 mg by mouth at bedtime. 08/14/16   [provider]  lidocaine (LIDODERM) 5 % Place 1 patch onto the skin daily. Remove & Discard patch within 12 hours or as directed by MD Patient not taking: Reported on 07/31/2020 04/26/20   Jonetta Osgood, MD  loratadine (CLARITIN) 10 MG tablet Take 10 mg by mouth daily as needed for allergies.    [provider]  losartan (COZAAR) 25 MG tablet Take 25 mg by mouth daily. 05/05/18   [provider]  nitroGLYCERIN (NITROSTAT) 0.4 MG SL tablet Place 0.4 mg under the tongue every 5 (five) minutes as needed for chest pain.    [provider]  pantoprazole (PROTONIX) 40 MG tablet Take 40 mg by mouth 2 (two) times daily. 02/26/20   [provider]  polyethylene glycol powder (MIRALAX) powder Start taking 1 capful 3 times a day. Slowly cut back as needed until you have normal bowel movements. Patient taking differently: Take 17 g by mouth daily as needed for  mild constipation. 08/17/17   Fatima Blank, MD  VENTOLIN HFA 108 [phone] Base) MCG/ACT inhaler INHALE 2 PUFFS INTO THE LUNGS EVERY 6 HOURS AS NEEDED 12/31/20   Spero Geralds, MD      Allergies    E-mycin [erythromycin], Sulfa antibiotics, and Lisinopril    Review of Systems   Review of Systems 10 Systems reviewed negative except as per HPI Physical Exam Updated Vital Signs There were no vitals taken for this visit. Physical Exam Constitutional:      Comments: Patient is alert.  She does have clear mental status.  No respiratory distress at rest.  She is in pain but tolerating well.  HENT:      Head: Normocephalic and atraumatic.     Mouth/Throat:     Mouth: Mucous membranes are moist.     Pharynx: Oropharynx is clear.  Eyes:     Extraocular Movements: Extraocular movements intact.     Conjunctiva/sclera: Conjunctivae normal.  Neck:     Comments: No midline C-spine tenderness. Cardiovascular:     Rate and Rhythm: Normal rate and regular rhythm.  Pulmonary:     Effort: Pulmonary effort is normal.     Breath sounds: Normal breath sounds.  Chest:     Chest wall: No tenderness.  Abdominal:     General: There is no distension.     Palpations: Abdomen is soft.     Tenderness: There is no abdominal tenderness. There is no guarding.  Musculoskeletal:     Comments: Patient has open displaced tib-fib fracture on the right lower extremity.  No active bleeding.  Distal pulse is palpable.  Angulation and pain with any movement at the left wrist.  No open areas of skin.  Radial pulses 2+.  Pain with any movement at the left knee with irregular deformity just above the knee.  Distal pulse is palpable.  Skin:    General: Skin is warm and dry.     Coloration: Skin is pale.  Neurological:     General: No focal deficit present.     Mental Status: She is oriented to person, place, and time.     Coordination: Coordination normal.     ED Results / Procedures / Treatments   Labs (all labs ordered are listed, but only abnormal results are displayed) Labs Reviewed - No data to display  EKG EKG Interpretation  Date/Time:  Thursday August 29 2021 12:00:29 EDT Ventricular Rate:  68 PR Interval:  216 QRS Duration: 152 QT Interval:  525 QTC Calculation: 559 R Axis:   55 Text Interpretation: Sinus rhythm Ventricular bigeminy Prolonged PR interval Right bundle branch block increased ectopy from prior 4/20 Confirmed by Aletta Edouard [phone]) on 08/30/2021 12:31:29 PM  Radiology No results found.  Procedures Procedures   CRITICAL CARE Performed by: Charlesetta Shanks   Total  critical care time: 30 minutes  Critical care time was exclusive of separately billable procedures and treating other patients.  Critical care was necessary to treat or prevent imminent or life-threatening deterioration.  Critical care was time spent personally by me on the following activities: development of treatment plan with patient and/or surrogate as well as nursing, discussions with consultants, evaluation of patient's response to treatment, examination of patient, obtaining history from patient or surrogate, ordering and performing treatments and interventions, ordering and review of laboratory studies, ordering and review of radiographic studies, pulse oximetry and re-evaluation of patient's condition.  Medications Ordered in ED Medications - No data to display  ED  Course/ Medical Decision Making/ A&P                           Medical Decision Making Amount and/or Complexity of Data Reviewed Labs: ordered. Radiology: ordered.   At baseline, patient has complex medical history with ESRD on dialysis.  At baseline she reports gait instability using a cane for support.  Patient had a mechanical fall.  She has multiple significant areas of orthopedic injury.  On clinical exam she has evident open right tib-fib fracture, probable distal femur or knee fracture on the left and clinical wrist fracture on the left.  We will proceed with multiple x-rays.  Patient does not endorse any head injury she has no mental status change, patient is not on blood thinners at this time I do not think CT head or neck imaging is indicated.  Patient was already given Ancef for open fractures.  We will proceed with fentanyl dosing for pain control.  We will proceed directly with orthopedics consult to discuss immediate OR management versus sedation and splinting for multiple fractures including open tib-fib fracture.  Although significant medical comorbidities and multiple fractures, patient is otherwise clinically  stable.  Consult: Reviewed with Orion Crook.  He has evaluated the patient immediately in the emergency department and determination was made to take the patient directly to the OR for management.  X-rays reviewed and looked at by myself include right lower leg with displaced tib-fib fracture, left femur with distal femur fracture impacted and left wrist with displaced radius fracture.  I have rechecked the patient prior to transport to the OR.  She remains alert with clear mental status.  She is in pain but denies need for additional pain medications at this time.  She will be imminently to the OR for management.        Final Clinical Impression(s) / ED Diagnoses Final diagnoses:  Severe comorbid illness  Fracture tibia/fibula, right, open type I or II, initial encounter  Closed fracture of distal end of left femur, unspecified fracture morphology, initial encounter ([place])  Closed fracture of left wrist, initial encounter    Rx / DC Orders ED Discharge Orders     None         Charlesetta Shanks, MD 09/06/21 [phone]

## 2021-08-29 NOTE — Progress Notes (Addendum)
   08/29/21 2249  Treat  Patients response to intervention Effective  Notify: Provider  Provider Name/Title Georges Mouse  Date Provider Notified 08/29/21  Method of Notification  (amion)  Notification Reason Requested by patient/family (home meds and dialysis ordered)  Provider response Other (Comment) (aware)  Date of Provider Response 08/29/21  Time of Provider Response 41   Pt requesting home meds and dialysis. Attending indicates [facility]. Msg Georges Mouse and then later spoke to [facility] consult service. No consult was placed by orthopedics. Needs consult or attending changed to orthopedic. Changing Nurses at this time. RN Ivin Booty aware of need to contact orthopedic for consult and aware of need for trapeze and over head bedframe.

## 2021-08-29 NOTE — Interval H&P Note (Signed)
History and Physical Interval Note:  08/29/2021 2:51 PM  Kendra Clay  has presented today for surgery, with the diagnosis of Right Tibia Fx, Left Wrist fx, Left Distal Fefemur Fx.  The various methods of treatment have been discussed with the patient and family. After consideration of risks, benefits and other options for treatment, the patient has consented to  Procedure(s): INTRAMEDULLARY (IM) NAIL TIBIAL WITH IRRIGATION AND DEBRIDMENT (Right) OPEN REDUCTION INTERNAL FIXATION (ORIF) DISTAL FEMUR FRACTURE (Left) OPEN REDUCTION INTERNAL FIXATION (ORIF) WRIST FRACTURE (Left) as a surgical intervention.  The patient's history has been reviewed, patient examined, no change in status, stable for surgery.  I have reviewed the patient's chart and labs.  Questions were answered to the patient's satisfaction.     Lennette Bihari P Elorah Dewing

## 2021-08-30 ENCOUNTER — Encounter (HOSPITAL_COMMUNITY): Payer: Self-pay | Admitting: Nephrology

## 2021-08-30 ENCOUNTER — Inpatient Hospital Stay (HOSPITAL_COMMUNITY): Payer: Medicare Other

## 2021-08-30 DIAGNOSIS — S72402A Unspecified fracture of lower end of left femur, initial encounter for closed fracture: Secondary | ICD-10-CM

## 2021-08-30 DIAGNOSIS — S82201B Unspecified fracture of shaft of right tibia, initial encounter for open fracture type I or II: Secondary | ICD-10-CM

## 2021-08-30 DIAGNOSIS — I10 Essential (primary) hypertension: Secondary | ICD-10-CM

## 2021-08-30 DIAGNOSIS — N186 End stage renal disease: Secondary | ICD-10-CM

## 2021-08-30 DIAGNOSIS — D61818 Other pancytopenia: Secondary | ICD-10-CM | POA: Diagnosis present

## 2021-08-30 DIAGNOSIS — S82401B Unspecified fracture of shaft of right fibula, initial encounter for open fracture type I or II: Secondary | ICD-10-CM

## 2021-08-30 DIAGNOSIS — I5022 Chronic systolic (congestive) heart failure: Secondary | ICD-10-CM

## 2021-08-30 DIAGNOSIS — W1830XA Fall on same level, unspecified, initial encounter: Secondary | ICD-10-CM

## 2021-08-30 LAB — HEPATITIS B SURFACE ANTIGEN: Hepatitis B Surface Ag: NONREACTIVE

## 2021-08-30 LAB — TYPE AND SCREEN
ABO/RH(D): O POS
Antibody Screen: NEGATIVE
Donor AG Type: NEGATIVE
Donor AG Type: NEGATIVE
Unit division: 0
Unit division: 0
Unit division: 0
Unit division: 0

## 2021-08-30 LAB — CBC WITH DIFFERENTIAL/PLATELET
Abs Immature Granulocytes: 0.03 10*3/uL (ref 0.00–0.07)
Basophils Absolute: 0 10*3/uL (ref 0.0–0.1)
Basophils Relative: 0 %
Eosinophils Absolute: 0 10*3/uL (ref 0.0–0.5)
Eosinophils Relative: 0 %
HCT: 33.3 % — ABNORMAL LOW (ref 36.0–46.0)
Hemoglobin: 11.2 g/dL — ABNORMAL LOW (ref 12.0–15.0)
Immature Granulocytes: 1 %
Lymphocytes Relative: 12 %
Lymphs Abs: 0.6 10*3/uL — ABNORMAL LOW (ref 0.7–4.0)
MCH: 28.8 pg (ref 26.0–34.0)
MCHC: 33.6 g/dL (ref 30.0–36.0)
MCV: 85.6 fL (ref 80.0–100.0)
Monocytes Absolute: 0.2 10*3/uL (ref 0.1–1.0)
Monocytes Relative: 5 %
Neutro Abs: 3.7 10*3/uL (ref 1.7–7.7)
Neutrophils Relative %: 82 %
Platelets: 96 10*3/uL — ABNORMAL LOW (ref 150–400)
RBC: 3.89 MIL/uL (ref 3.87–5.11)
RDW: 15.1 % (ref 11.5–15.5)
WBC: 4.5 10*3/uL (ref 4.0–10.5)
nRBC: 0 % (ref 0.0–0.2)

## 2021-08-30 LAB — HEPATITIS PANEL, ACUTE
HCV Ab: NONREACTIVE
Hep A IgM: NONREACTIVE
Hep B C IgM: NONREACTIVE
Hepatitis B Surface Ag: NONREACTIVE

## 2021-08-30 LAB — BPAM RBC
Blood Product Expiration Date: 202308282359
Blood Product Expiration Date: 202308282359
Blood Product Expiration Date: 202308292359
Blood Product Expiration Date: 202308292359
ISSUE DATE / TIME: 202307271531
ISSUE DATE / TIME: 202307271531
ISSUE DATE / TIME: 202307271704
ISSUE DATE / TIME: 202307272255
Unit Type and Rh: 5100
Unit Type and Rh: 5100
Unit Type and Rh: 5100
Unit Type and Rh: 5100

## 2021-08-30 LAB — COMPREHENSIVE METABOLIC PANEL
ALT: 11 U/L (ref 0–44)
AST: 23 U/L (ref 15–41)
Albumin: 2.7 g/dL — ABNORMAL LOW (ref 3.5–5.0)
Alkaline Phosphatase: 153 U/L — ABNORMAL HIGH (ref 38–126)
Anion gap: 15 (ref 5–15)
BUN: 31 mg/dL — ABNORMAL HIGH (ref 6–20)
CO2: 24 mmol/L (ref 22–32)
Calcium: 7.5 mg/dL — ABNORMAL LOW (ref 8.9–10.3)
Chloride: 91 mmol/L — ABNORMAL LOW (ref 98–111)
Creatinine, Ser: 7.48 mg/dL — ABNORMAL HIGH (ref 0.44–1.00)
GFR, Estimated: 6 mL/min — ABNORMAL LOW (ref >60)
Glucose, Bld: 133 mg/dL — ABNORMAL HIGH (ref 70–99)
Potassium: 4.7 mmol/L (ref 3.5–5.1)
Sodium: 130 mmol/L — ABNORMAL LOW (ref 135–145)
Total Bilirubin: 0.6 mg/dL (ref 0.3–1.2)
Total Protein: 6.3 g/dL — ABNORMAL LOW (ref 6.5–8.1)

## 2021-08-30 LAB — FOLATE: Folate: 6.8 ng/mL (ref >5.9)

## 2021-08-30 LAB — HEPATITIS B CORE ANTIBODY, TOTAL: Hep B Core Total Ab: NONREACTIVE

## 2021-08-30 LAB — RESP PANEL BY RT-PCR (FLU A&B, COVID) ARPGX2
Influenza A by PCR: NEGATIVE
Influenza B by PCR: NEGATIVE
SARS Coronavirus 2 by RT PCR: POSITIVE — AB

## 2021-08-30 LAB — IRON AND TIBC
Iron: 44 ug/dL (ref 28–170)
Saturation Ratios: 28 % (ref 10.4–31.8)
TIBC: 160 ug/dL — ABNORMAL LOW (ref 250–450)
UIBC: 116 ug/dL

## 2021-08-30 LAB — VITAMIN B12: Vitamin B-12: 203 pg/mL (ref 180–914)

## 2021-08-30 LAB — VITAMIN D 25 HYDROXY (VIT D DEFICIENCY, FRACTURES): Vit D, 25-Hydroxy: 14.02 ng/mL — ABNORMAL LOW (ref 30–100)

## 2021-08-30 LAB — RETICULOCYTES
Immature Retic Fract: 11.1 % (ref 2.3–15.9)
RBC.: 3.82 MIL/uL — ABNORMAL LOW (ref 3.87–5.11)
Retic Count, Absolute: 49.7 10*3/uL (ref 19.0–186.0)
Retic Ct Pct: 1.3 % (ref 0.4–3.1)

## 2021-08-30 LAB — C-REACTIVE PROTEIN: CRP: 1 mg/dL — ABNORMAL HIGH (ref <1.0)

## 2021-08-30 LAB — FERRITIN: Ferritin: 3954 ng/mL — ABNORMAL HIGH (ref 11–307)

## 2021-08-30 LAB — TSH: TSH: 0.647 u[IU]/mL (ref 0.350–4.500)

## 2021-08-30 LAB — HEPATITIS B SURFACE ANTIBODY,QUALITATIVE: Hep B S Ab: REACTIVE — AB

## 2021-08-30 LAB — D-DIMER, QUANTITATIVE: D-Dimer, Quant: 2.82 ug/mL-FEU — ABNORMAL HIGH (ref 0.00–0.50)

## 2021-08-30 LAB — PHOSPHORUS: Phosphorus: 5 mg/dL — ABNORMAL HIGH (ref 2.5–4.6)

## 2021-08-30 LAB — MAGNESIUM: Magnesium: 1.7 mg/dL (ref 1.7–2.4)

## 2021-08-30 MED ORDER — MOLNUPIRAVIR EUA 200MG CAPSULE
4.0000 | ORAL_CAPSULE | Freq: Two times a day (BID) | ORAL | Status: AC
  Administered 2021-08-30 – 2021-09-04 (×10): 800 mg via ORAL
  Filled 2021-08-30 (×3): qty 4

## 2021-08-30 MED ORDER — CHLORHEXIDINE GLUCONATE CLOTH 2 % EX PADS
6.0000 | MEDICATED_PAD | Freq: Every day | CUTANEOUS | Status: DC
  Administered 2021-08-30 – 2021-09-05 (×6): 6 via TOPICAL

## 2021-08-30 MED ORDER — PANTOPRAZOLE SODIUM 40 MG PO TBEC
40.0000 mg | DELAYED_RELEASE_TABLET | Freq: Two times a day (BID) | ORAL | Status: DC
  Administered 2021-08-30 – 2021-09-09 (×21): 40 mg via ORAL
  Filled 2021-08-30 (×21): qty 1

## 2021-08-30 MED ORDER — APIXABAN 2.5 MG PO TABS
2.5000 mg | ORAL_TABLET | Freq: Two times a day (BID) | ORAL | Status: DC
  Administered 2021-08-30 – 2021-09-09 (×20): 2.5 mg via ORAL
  Filled 2021-08-30 (×20): qty 1

## 2021-08-30 MED ORDER — CARVEDILOL 12.5 MG PO TABS
12.5000 mg | ORAL_TABLET | Freq: Two times a day (BID) | ORAL | Status: DC
  Administered 2021-08-30: 12.5 mg via ORAL
  Filled 2021-08-30: qty 1

## 2021-08-30 MED ORDER — POTASSIUM CHLORIDE CRYS ER 20 MEQ PO TBCR
20.0000 meq | EXTENDED_RELEASE_TABLET | Freq: Once | ORAL | Status: AC
  Administered 2021-08-30: 20 meq via ORAL
  Filled 2021-08-30: qty 1

## 2021-08-30 MED ORDER — CINACALCET HCL 30 MG PO TABS
30.0000 mg | ORAL_TABLET | Freq: Every day | ORAL | Status: DC
  Administered 2021-08-30: 30 mg via ORAL
  Filled 2021-08-30: qty 1

## 2021-08-30 MED ORDER — ATORVASTATIN CALCIUM 40 MG PO TABS
40.0000 mg | ORAL_TABLET | Freq: Every day | ORAL | Status: DC
  Administered 2021-08-30 – 2021-09-08 (×10): 40 mg via ORAL
  Filled 2021-08-30 (×10): qty 1

## 2021-08-30 MED ORDER — ISOSORBIDE MONONITRATE ER 30 MG PO TB24
30.0000 mg | ORAL_TABLET | Freq: Every day | ORAL | Status: DC
  Administered 2021-08-30: 30 mg via ORAL
  Filled 2021-08-30: qty 1

## 2021-08-30 MED ORDER — FERRIC CITRATE 1 GM 210 MG(FE) PO TABS
210.0000 mg | ORAL_TABLET | Freq: Three times a day (TID) | ORAL | Status: DC
  Administered 2021-08-30 – 2021-09-09 (×23): 210 mg via ORAL
  Filled 2021-08-30 (×24): qty 1

## 2021-08-30 NOTE — Progress Notes (Signed)
Pt receives out-pt HD at [facility] on MWF. Pt arrives around 5:30 for 5:50 chair time. Will assist as needed.   Melven Sartorius Renal Navigator [phone]

## 2021-08-30 NOTE — TOC CAGE-AID Note (Signed)
Transition of Care [facility]) - CAGE-AID Screening   Patient Details  Name: JULIZZA SASSONE MRN: [id number] Date of Birth: [birth year]  Transition of Care [facility]) CM/SW Contact:    Joanne Chars, LCSW Phone Number: 08/30/2021, 11:57 AM   Clinical Narrative:CSW spoke with pt to complete Cage Aid.  Pt denies any alcohol or drug use at this time.  No resources/education needed.     CAGE-AID Screening:    Have You Ever Felt You Ought to Cut Down on Your Drinking or Drug Use?: No Have People Annoyed You By Critizing Your Drinking Or Drug Use?: No Have You Felt Bad Or Guilty About Your Drinking Or Drug Use?: No Have You Ever Had a Drink or Used Drugs First Thing In The Morning to Steady Your Nerves or to Get Rid of a Hangover?: No CAGE-AID Score: 0  Substance Abuse Education Offered: No

## 2021-08-30 NOTE — Progress Notes (Signed)
Orthopaedic Trauma Progress Note  S: Patient working with therapy.  She states that the pain in the wrist is most significant.  However the pain in her lower extremities is bearable.  She was able to transfer to the chair during my evaluation.  O:  Vitals:   08/30/21 0106 08/30/21 0813  BP: (!) 138/57 (!) 143/89  Pulse: 63 78  Resp: 18 17  Temp: 98.3 F (36.8 C) 98.2 F (36.8 C)  SpO2: 96% 98%    Right lower extremity: Boot is in place.  Is clean dry and intact.  Dressing is in good position patient was able to weight-bear.  She is neurovascular intact  Left lower extremity: Dressings are clean dry and intact.  Good knee range of motion.  Compartments are soft compressible.  Motor and sensory function intact.  Left upper extremity: Splint is in place clean dry and intact.  Motor and sensory function intact.  Imaging: Stable postoperative imaging  Labs:  Results for orders placed or performed during the hospital encounter of 08/29/21 (from the past 24 hour(s))  Comprehensive metabolic panel     Status: Abnormal   Collection Time: 08/29/21 12:13 PM  Result Value Ref Range   Sodium 132 (L) 135 - 145 mmol/L   Potassium 3.3 (L) 3.5 - 5.1 mmol/L   Chloride 95 (L) 98 - 111 mmol/L   CO2 25 22 - 32 mmol/L   Glucose, Bld 119 (H) 70 - 99 mg/dL   BUN 23 (H) 6 - 20 mg/dL   Creatinine, Ser 6.59 (H) 0.44 - 1.00 mg/dL   Calcium 7.1 (L) 8.9 - 10.3 mg/dL   Total Protein 5.6 (L) 6.5 - 8.1 g/dL   Albumin 2.6 (L) 3.5 - 5.0 g/dL   AST 25 15 - 41 U/L   ALT 16 0 - 44 U/L   Alkaline Phosphatase 152 (H) 38 - 126 U/L   Total Bilirubin 0.7 0.3 - 1.2 mg/dL   GFR, Estimated 7 (L) >60 mL/min   Anion gap 12 5 - 15  CBC with Differential     Status: Abnormal   Collection Time: 08/29/21 12:13 PM  Result Value Ref Range   WBC 2.6 (L) 4.0 - 10.5 K/uL   RBC 2.65 (L) 3.87 - 5.11 MIL/uL   Hemoglobin 7.6 (L) 12.0 - 15.0 g/dL   HCT 24.1 (L) 36.0 - 46.0 %   MCV 90.9 80.0 - 100.0 fL   MCH 28.7 26.0 - 34.0  pg   MCHC 31.5 30.0 - 36.0 g/dL   RDW 14.6 11.5 - 15.5 %   Platelets 82 (L) 150 - 400 K/uL   nRBC 0.0 0.0 - 0.2 %   Neutrophils Relative % 73 %   Neutro Abs 1.9 1.7 - 7.7 K/uL   Lymphocytes Relative 21 %   Lymphs Abs 0.5 (L) 0.7 - 4.0 K/uL   Monocytes Relative 5 %   Monocytes Absolute 0.1 0.1 - 1.0 K/uL   Eosinophils Relative 0 %   Eosinophils Absolute 0.0 0.0 - 0.5 K/uL   Basophils Relative 0 %   Basophils Absolute 0.0 0.0 - 0.1 K/uL   Immature Granulocytes 1 %   Abs Immature Granulocytes 0.02 0.00 - 0.07 K/uL  Protime-INR     Status: None   Collection Time: 08/29/21 12:13 PM  Result Value Ref Range   Prothrombin Time 14.8 11.4 - 15.2 seconds   INR 1.2 0.8 - 1.2  Type and screen [place]     Status: None  Collection Time: 08/29/21  1:35 PM  Result Value Ref Range   ABO/RH(D) O POS    Antibody Screen NEG    Sample Expiration 09/01/2021,2359    Antibody Identification ANTI E    Unit Number [id number]    Blood Component Type RED CELLS,LR    Unit division 00    Status of Unit REL FROM [facility]    Transfusion Status OK TO TRANSFUSE    Crossmatch Result Compatible    Unit Number [id number]    Blood Component Type RED CELLS,LR    Unit division 00    Status of Unit REL FROM [facility]    Transfusion Status OK TO TRANSFUSE    Crossmatch Result Compatible    Unit Number [id number]    Blood Component Type RED CELLS,LR    Unit division 00    Status of Unit ISSUED,FINAL    Transfusion Status OK TO TRANSFUSE    Crossmatch Result COMPATIBLE    Donor AG Type NEGATIVE FOR E ANTIGEN    Unit Number [id number]    Blood Component Type RED CELLS,LR    Unit division 00    Status of Unit ISSUED,FINAL    Transfusion Status OK TO TRANSFUSE    Crossmatch Result COMPATIBLE    Donor AG Type NEGATIVE FOR E ANTIGEN   Surgical pcr screen     Status: None   Collection Time: 08/29/21  2:38 PM   Specimen: Nasal Mucosa; Nasal Swab  Result Value Ref Range   MRSA, PCR  NEGATIVE NEGATIVE   Staphylococcus aureus NEGATIVE NEGATIVE  Prepare RBC (crossmatch)     Status: None   Collection Time: 08/29/21  3:04 PM  Result Value Ref Range   Order Confirmation      ORDER PROCESSED BY BLOOD BANK Performed at [facility], [place] [address] [place], [place] [address]   CBC with Differential/Platelet     Status: Abnormal   Collection Time: 08/30/21  3:44 AM  Result Value Ref Range   WBC 4.5 4.0 - 10.5 K/uL   RBC 3.89 3.87 - 5.11 MIL/uL   Hemoglobin 11.2 (L) 12.0 - 15.0 g/dL   HCT 33.3 (L) 36.0 - 46.0 %   MCV 85.6 80.0 - 100.0 fL   MCH 28.8 26.0 - 34.0 pg   MCHC 33.6 30.0 - 36.0 g/dL   RDW 15.1 11.5 - 15.5 %   Platelets 96 (L) 150 - 400 K/uL   nRBC 0.0 0.0 - 0.2 %   Neutrophils Relative % 82 %   Neutro Abs 3.7 1.7 - 7.7 K/uL   Lymphocytes Relative 12 %   Lymphs Abs 0.6 (L) 0.7 - 4.0 K/uL   Monocytes Relative 5 %   Monocytes Absolute 0.2 0.1 - 1.0 K/uL   Eosinophils Relative 0 %   Eosinophils Absolute 0.0 0.0 - 0.5 K/uL   Basophils Relative 0 %   Basophils Absolute 0.0 0.0 - 0.1 K/uL   Immature Granulocytes 1 %   Abs Immature Granulocytes 0.03 0.00 - 0.07 K/uL  Comprehensive metabolic panel     Status: Abnormal   Collection Time: 08/30/21  3:44 AM  Result Value Ref Range   Sodium 130 (L) 135 - 145 mmol/L   Potassium 4.7 3.5 - 5.1 mmol/L   Chloride 91 (L) 98 - 111 mmol/L   CO2 24 22 - 32 mmol/L   Glucose, Bld 133 (H) 70 - 99 mg/dL   BUN 31 (H) 6 - 20 mg/dL   Creatinine, Ser 7.48 (H) 0.44 -  1.00 mg/dL   Calcium 7.5 (L) 8.9 - 10.3 mg/dL   Total Protein 6.3 (L) 6.5 - 8.1 g/dL   Albumin 2.7 (L) 3.5 - 5.0 g/dL   AST 23 15 - 41 U/L   ALT 11 0 - 44 U/L   Alkaline Phosphatase 153 (H) 38 - 126 U/L   Total Bilirubin 0.6 0.3 - 1.2 mg/dL   GFR, Estimated 6 (L) >60 mL/min   Anion gap 15 5 - 15  Magnesium     Status: None   Collection Time: 08/30/21  3:44 AM  Result Value Ref Range   Magnesium 1.7 1.7 - 2.4 mg/dL  Phosphorus     Status: Abnormal    Collection Time: 08/30/21  3:44 AM  Result Value Ref Range   Phosphorus 5.0 (H) 2.5 - 4.6 mg/dL  Resp Panel by RT-PCR (Flu A&B, Covid) Anterior Nasal Swab     Status: Abnormal   Collection Time: 08/30/21  6:37 AM   Specimen: Anterior Nasal Swab  Result Value Ref Range   SARS Coronavirus 2 by RT PCR POSITIVE (A) NEGATIVE   Influenza A by PCR NEGATIVE NEGATIVE   Influenza B by PCR NEGATIVE NEGATIVE    Assessment: 61 year old female with end-stage renal disease on dialysis fall  Injuries: 1.  Supracondylar distal femur fracture status post ORIF 2.  Type II open tibial shaft fractures status post I&D and intramedullary nailing 3.  Open reduction internal fixation of left distal radius and percutaneous fixation of left distal ulna  Weightbearing: Weightbearing as tolerated bilateral lower extremities: Nonweightbearing left upper extremity through the wrist, weightbearing as tolerated through the elbow  Insicional and dressing care: Dressings to be changed postoperative day 2 or 3  Orthopedic device(s): Boot to the right lower extremity splint to the left upper extremity  CV/Blood loss: Acute blood loss anemia continue to monitor  VTE prophylaxis: We will recommend a DOAC for DVT prophylaxis  ID: Postoperative Ancef  Medical co-morbidities: Dialysis per nephrology  Dispo: To be determined after PT and OT evaluation  Follow - up plan: 2 weeks for x-rays and suture removal  Shona Needles, MD [facility] [phone] (office) [email or web address]

## 2021-08-30 NOTE — Anesthesia Postprocedure Evaluation (Signed)
Anesthesia Post Note  Patient: Kendra Clay  Procedure(s) Performed: INTRAMEDULLARY (IM) NAIL TIBIAL WITH IRRIGATION AND DEBRIDMENT (Right: Leg Lower) OPEN REDUCTION INTERNAL FIXATION (ORIF) DISTAL FEMUR FRACTURE (Left: Leg Lower) OPEN REDUCTION INTERNAL FIXATION (ORIF) WRIST FRACTURE (Left: Wrist)     Patient location during evaluation: PACU Anesthesia Type: General Level of consciousness: awake Pain management: pain level controlled Vital Signs Assessment: post-procedure vital signs reviewed and stable Respiratory status: spontaneous breathing Cardiovascular status: stable Postop Assessment: no apparent nausea or vomiting Anesthetic complications: no   No notable events documented.  Last Vitals:  Vitals:   08/30/21 0106 08/30/21 0813  BP: (!) 138/57 (!) 143/89  Pulse: 63 78  Resp: 18 17  Temp: 36.8 C 36.8 C  SpO2: 96% 98%    Last Pain:  Vitals:   08/30/21 0813  TempSrc: Oral  PainSc:                  Filimon Miranda

## 2021-08-30 NOTE — Procedures (Signed)
HD Note  Patient refused the 15g needles and wanted the 16g.  Consulted with Dr. Barry Dienes and was directed to use the 16g and get the highest BFR possible.

## 2021-08-30 NOTE — Evaluation (Signed)
Physical Therapy Evaluation Patient Details Name: Kendra Clay MRN: [id number] DOB: July 05, 1960 Today's Date: 08/30/2021  History of Present Illness  Kendra Clay is a 61 y.o. female with past medical history of ESRD on HD MWF, essential hypertension, coronary artery disease status post stent placement in 2017, dilated cardiomyopathy, chronic combined diastolic and systolic CHF, who initially presented to [facility] ED after a ground-level fall while walking up the stairs.  She incurred multiple fractures, to her left supracondylar distal femur fracture, right tibia shaft fracture, left distal radius and ulna fracture.  The patient underwent repair of those fractures on 08/29/21.  Clinical Impression  Patient presents with significant deficits in mobility due to pain, limited AROM bilateral LE's and L UE and decreased strength.  Though she is WBAT in LE's having difficulty with pain and R foot with camboot.  She was previously independent with a cane at home.  Today needed +2 for sit to stand and to step to recliner.  Feel she may need acute inpatient rehab to decrease burden of care and improve independence prior to d/c home.   She has to coordinate therapies around HD.  Son can assist upon d/c.      Recommendations for follow up therapy are one component of a multi-disciplinary discharge planning process, led by the attending physician.  Recommendations may be updated based on patient status, additional functional criteria and insurance authorization.  Follow Up Recommendations Acute inpatient rehab (3hours/day)      Assistance Recommended at Discharge Intermittent Supervision/Assistance  Patient can return home with the following  A lot of help with walking and/or transfers;A lot of help with bathing/dressing/bathroom;Assist for transportation;Help with stairs or ramp for entrance;Direct supervision/assist for medications management;Assistance with cooking/housework    Equipment  Recommendations Wheelchair cushion (measurements PT);Wheelchair (measurements PT)  Recommendations for Other Services       Functional Status Assessment Patient has had a recent decline in their functional status and demonstrates the ability to make significant improvements in function in a reasonable and predictable amount of time.     Precautions / Restrictions Precautions Precautions: Fall Required Braces or Orthoses: Other Brace Other Brace: camboot on R Restrictions Weight Bearing Restrictions: Yes LUE Weight Bearing: Weight bear through elbow only (NWB through wrist) RLE Weight Bearing: Weight bearing as tolerated LLE Weight Bearing: Weight bearing as tolerated      Mobility  Bed Mobility Overal bed mobility: Needs Assistance Bed Mobility: Supine to Sit     Supine to sit: HOB elevated, Max assist     General bed mobility comments: slowly assisting each leg to EOB then pt lifting trunk with rail, assist to scoot hips to EOB    Transfers Overall transfer level: Needs assistance Equipment used: Left platform walker Transfers: Sit to/from Stand, Bed to chair/wheelchair/BSC Sit to Stand: Max assist, +2 physical assistance, From elevated surface   Step pivot transfers: Mod assist, +2 physical assistance       General transfer comment: MD in to visit and assisted with sit to stand after failed attempt with +1 A. Lifting help under arms and hips with bed pad; stepping to recliner with PFRW and slow stepping max cues for sequencing and for UE weight bearing to off load LE's    Ambulation/Gait                  Stairs            Wheelchair Mobility    Modified Rankin (Stroke Patients Only)  Balance Overall balance assessment: Needs assistance Sitting-balance support: Feet supported Sitting balance-Leahy Scale: Fair     Standing balance support: Bilateral upper extremity supported, Reliant on assistive device for balance Standing balance-Leahy  Scale: Poor                               Pertinent Vitals/Pain Pain Assessment Pain Assessment: 0-10 Pain Score: 3  Pain Location: L wrist Pain Descriptors / Indicators: Discomfort, Aching Pain Intervention(s): Monitored during session    Home Living Family/patient expects to be discharged to:: Private residence Living Arrangements: Children Available Help at Discharge: Family;Available 24 hours/day Type of Home: House Home Access: Stairs to enter Entrance Stairs-Rails: None Entrance Stairs-Number of Steps: 2   Home Layout: One level Home Equipment: Cane - single Barista (2 wheels);Rollator (4 wheels);Shower seat - built in      Prior Function Prior Level of Function : Independent/Modified Independent             Mobility Comments: uses cane       Hand Dominance   Dominant Hand: Right    Extremity/Trunk Assessment   Upper Extremity Assessment Upper Extremity Assessment: LUE deficits/detail LUE Deficits / Details: lifts weakly at shoulder through 25% of ROM and flexes elbow about 50%; grips weakly, more painful at thumb    Lower Extremity Assessment Lower Extremity Assessment: RLE deficits/detail;LLE deficits/detail RLE Deficits / Details: AAROM flexion at knee in supine about 30, in sitting about 80, wearing camboot so ankle not assessed, positive quad set RLE: Unable to fully assess due to immobilization LLE Deficits / Details: AAROM limited knee flexion due to pain, in sitting up to about 60, weak quad set and ankle DF/PF noted mildly limited by pain       Communication   Communication: No difficulties  Cognition Arousal/Alertness: Awake/alert Behavior During Therapy: WFL for tasks assessed/performed Overall Cognitive Status: Within Functional Limits for tasks assessed                                          General Comments General comments (skin integrity, edema, etc.): fistula R UE for HD; assisted pt in  ordering meal as had not eaten since prior to surgery    Exercises General Exercises - Lower Extremity Ankle Circles/Pumps: AROM, Left, 5 reps, Supine Quad Sets: AROM, Both, 5 reps, Supine Heel Slides: AAROM, Right, 5 reps, Supine   Assessment/Plan    PT Assessment Patient needs continued PT services  PT Problem List Decreased strength;Decreased mobility;Decreased balance;Decreased knowledge of use of DME;Pain;Decreased activity tolerance;Decreased range of motion;Decreased safety awareness;Decreased knowledge of precautions       PT Treatment Interventions DME instruction;Therapeutic exercise;Gait training;Wheelchair mobility training;Balance training;Functional mobility training;Therapeutic activities;Patient/family education    PT Goals (Current goals can be found in the Care Plan section)  Acute Rehab PT Goals Patient Stated Goal: to return to independent PT Goal Formulation: With patient Time For Goal Achievement: 09/13/21 Potential to Achieve Goals: Good    Frequency Min 5X/week     Co-evaluation               AM-PAC PT "6 Clicks" Mobility  Outcome Measure Help needed turning from your back to your side while in a flat bed without using bedrails?: Total Help needed moving from lying on your back to sitting on the side of  a flat bed without using bedrails?: Total Help needed moving to and from a bed to a chair (including a wheelchair)?: Total Help needed standing up from a chair using your arms (e.g., wheelchair or bedside chair)?: Total Help needed to walk in hospital room?: Total Help needed climbing 3-5 steps with a railing? : Total 6 Click Score: 6    End of Session   Activity Tolerance: Patient limited by pain Patient left: in chair;with call bell/phone within reach;with chair alarm set Nurse Communication: Mobility status;Need for lift equipment PT Visit Diagnosis: Other abnormalities of gait and mobility (R26.89);Difficulty in walking, not elsewhere  classified (R26.2);Pain Pain - Right/Left: Left Pain - part of body: Hip    Time: 6016-5800 PT Time Calculation (min) (ACUTE ONLY): 55 min   Charges:   PT Evaluation $PT Eval Moderate Complexity: 1 Mod PT Treatments $Therapeutic Exercise: 8-22 mins $Therapeutic Activity: 8-22 mins        Magda Kiel, PT Acute Rehabilitation Services [phone] 08/30/2021   Reginia Naas 08/30/2021, 11:05 AM

## 2021-08-30 NOTE — Consult Note (Signed)
Renal Service Consult Note [facility]  Kendra Clay 08/30/2021 Sol Blazing, MD Requesting Physician: Dr. Tana Coast  Reason for Consult: ESRD pt sp fall w/ multiple bone fractures HPI: The patient is a 61 y.o. year-old w/ hx of ESRD, HTN, polycystic kidney disease who presented to ED after falling while walking up stairs. In ED work up showed multiple bone fractures. Labs and VS were stable and pt was taken to OR by orthopedic team for repair of the fractures. Pt did well in surgery. We are asked to see for ESRD.    Pt seen in room.  Was here 1 year ago w/ a fall and pelvic bone fracture, she had COVID at that time.  Her mother died recently 2 wks ago from pulm fibrosis complicated by COVID infection.  She is feeling well post op, has no /co at this time. Due for HD today.   ROS - denies CP, no joint pain, no HA, no blurry vision, no rash, no diarrhea, no nausea/ vomiting, no dysuria, no difficulty voiding   Past Medical History  Past Medical History:  Diagnosis Date   Complication of anesthesia    Enlarged liver    secondary to PKD   Heart murmur    "slight" per ? Dr Deatra Ina 30 years ago. 2D ECHO  30 yearsa go.   History of blood transfusion    C- Section   History of bronchitis    numerous, last time> 1 year   Hypertension    Night muscle spasms    legs   Polycystic kidney disease    Genetic dx 23 years ago   PONV (postoperative nausea and vomiting)    patch helped   Scoliosis    Strabismus    Past Surgical History  Past Surgical History:  Procedure Laterality Date   ABDOMINAL HYSTERECTOMY     AV FISTULA PLACEMENT  09/01/2011   Procedure: ARTERIOVENOUS (AV) FISTULA CREATION;  Surgeon: Rosetta Posner, MD;  Location: [place];  Service: Vascular;  Laterality: Right;   [place]     for lazy eye   TIBIA IM NAIL INSERTION Left 05/19/2018   Procedure: INTRAMEDULLARY (IM) NAIL TIBIAL;  Surgeon: Hiram Gash, MD;  Location: [place];   Service: Orthopedics;  Laterality: Left;   Family History  Family History  Problem Relation Age of Onset   Arthritis Mother    Kidney disease Father    Polycystic kidney disease Son    Asthma Son    Hypertension Maternal Grandmother    Social History  reports that she quit smoking about 6 years ago. Her smoking use included cigarettes. She started smoking about 41 years ago. She has a 45.00 pack-year smoking history. She quit smokeless tobacco use about 7 years ago. She reports that she does not drink alcohol and does not use drugs. Allergies  Allergies  Allergen Reactions   E-Mycin [Erythromycin] Hives   Sulfa Antibiotics Rash    Other reaction(s): Skin Rash Other reaction(s): Skin Rash    Lisinopril Cough   Home medications Prior to Admission medications   Medication Sig Start Date End Date Taking? Authorizing Provider  acetaminophen (TYLENOL) 500 MG tablet Take 2 tablets (1,000 mg total) by mouth every 8 (eight) hours. Patient taking differently: Take 1,000 mg by mouth every 8 (eight) hours as needed for mild pain or headache. 05/20/18  Yes Emokpae, Courage, MD  atorvastatin (LIPITOR) 40 MG tablet Take 40 mg by mouth  at bedtime.   Yes [provider]  carvedilol (COREG) 12.5 MG tablet Take 12.5 mg by mouth 2 (two) times daily. 06/11/17  Yes [provider]  cinacalcet (SENSIPAR) 30 MG tablet Take 30 mg by mouth daily with supper.   Yes [provider]  dextromethorphan-guaiFENesin (MUCINEX DM) 30-600 MG 12hr tablet Take 2 tablets by mouth as needed for cough.   Yes [provider]  ferric citrate (AURYXIA) 1 GM 210 MG(Fe) tablet Take 210 mg by mouth 3 (three) times daily with meals.   Yes [provider]  isosorbide mononitrate (IMDUR) 30 MG 24 hr tablet Take 30 mg by mouth at bedtime. 08/14/16  Yes [provider]  loperamide (IMODIUM A-D) 2 MG tablet Take 2 mg by mouth as needed for diarrhea or loose stools.   Yes [provider]  loratadine (CLARITIN) 10 MG tablet Take 10 mg by mouth daily as needed for allergies.   Yes [provider]  losartan (COZAAR) 25 MG tablet Take 25 mg by mouth daily. 05/05/18  Yes [provider]  pantoprazole (PROTONIX) 40 MG tablet Take 40 mg by mouth 2 (two) times daily. 02/26/20  Yes [provider]  B Complex-C-Zn-Folic Acid (DIALYVITE 505 WITH ZINC) 0.8 MG TABS Take 1 tablet by mouth at bedtime. 08/30/18   [provider]  estradiol (ESTRACE) 0.1 MG/GM vaginal cream Place 1 Applicatorful vaginally at bedtime as needed (yeast infection). 03/30/20   [provider]  lidocaine (LIDODERM) 5 % Place 1 patch onto the skin daily. Remove & Discard patch within 12 hours or as directed by MD Patient not taking: Reported on 07/31/2020 04/26/20   Jonetta Osgood, MD  nitroGLYCERIN (NITROSTAT) 0.4 MG SL tablet Place 0.4 mg under the tongue every 5 (five) minutes as needed for chest pain.    [provider]  polyethylene glycol powder (MIRALAX) powder Start taking 1 capful 3 times a day. Slowly cut back as needed until you have normal bowel movements. Patient taking differently: Take 17 g by mouth daily as needed for mild constipation. 08/17/17   Fatima Blank, MD  VENTOLIN HFA [phone] Base) MCG/ACT inhaler INHALE 2 PUFFS INTO THE LUNGS EVERY 6 HOURS AS NEEDED 12/31/20   Spero Geralds, MD     Vitals:   08/29/21 2047 08/30/21 0106 08/30/21 0116 08/30/21 0813  BP: (!) 151/89 (!) 138/57  (!) 143/89  Pulse: 79 63  78  Resp: 16 18  17   Temp: 97.6 F (36.4 C) 98.3 F (36.8 C)  98.2 F (36.8 C)  TempSrc: Oral Oral  Oral  SpO2: 100% 96%  98%  Weight:   66.3 kg   Height:       Exam Gen alert, no distress No rash, cyanosis or gangrene Sclera anicteric, throat clear  No jvd or bruits Chest clear bilat to bases, no rales/ wheezing RRR no MRG Abd soft ntnd no mass or ascites +bs GU normal MS no joint effusions or  deformity Ext no LE or UE edema, no wounds or ulcers Neuro is alert, Ox 3 , nf    RFA AVF +bruit   Home meds include - atorvastatin, carvedilol 12.5 bid, cinacalcet 30 hs, ferric citrate 210 mg tid ac, isosorbide mononitrate 30 hs, losartan 25 qd, pantoprazole, estradiol, sl nitroclycering, albuterol, prns/ vits / supps   OP HD: [place] MWF  3h 31min  350/1.5   60.5kg  2/2 bath  P2  Hep 1200  RFA AVF -  last HD 7/26 , post wt 60.4kg  - last Hb 9.5 on 7/26 - doxercalciferol 8 ug tiw IV - mircera 75 ug q 4wks, last 7/24   Assessment/ Plan: SP fall/ sp repair of multiple extremity fractures - done 7/27, per pmd / ortho ESRD - on HD MWF. Plan HD today, has not missed HD and vol /labs ok HTN - home meds on hold, will resume when needed.  Vol - no sig vol excess on exam, plan UF 2 L as tolerated w/ HD today Anemia abl - sp 2u prbcs yest, Hb 7.6 > 11 today.  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 Tranfuse prn MBD ckd - CCa a bit low, phos in range. Cont auryxia as binder, cont cinacalcet po qd and IV vdra w/ HD tiw.       Kelly Splinter  MD 08/30/2021, 9:48 AM Recent Labs  Lab 08/29/21 1213 08/30/21 0344  HGB 7.6* 11.2*  ALBUMIN 2.6* 2.7*  CALCIUM 7.1* 7.5*  PHOS  --  5.0*  CREATININE 6.59* 7.48*  K 3.3* 4.7

## 2021-08-30 NOTE — Progress Notes (Signed)
Inpatient Rehab Admissions Coordinator:   Per PT recommendations, pt was screened for CIR by Shann Medal, PT, DPT.  Pt does meet criteria for acute inpatient rehabilitation program, however note covid + on 7/27.  Will need to be cleared from airborne isolation before we can consider CIR admit.  Will follow peripherally for now and let TOC team know.   Shann Medal, PT, DPT Admissions Coordinator [phone] 08/30/21  12:20 PM

## 2021-08-30 NOTE — Progress Notes (Signed)
[facility] for apixaban Indication: VTE prophylaxis  Allergies  Allergen Reactions   E-Mycin [Erythromycin] Hives   Sulfa Antibiotics Rash    Other reaction(s): Skin Rash Other reaction(s): Skin Rash    Lisinopril Cough    Patient Measurements: Height: 5\' 3"  (160 cm) Weight: 66.3 kg (146 lb 2.6 oz) IBW/kg (Calculated) : 52.4  Vital Signs: Temp: 98 F (36.7 C) (07/28 1345) Temp Source: Oral (07/28 1345) BP: 148/74 (07/28 1345) Pulse Rate: 71 (07/28 1345)  Labs: Recent Labs    08/29/21 1213 08/30/21 0344  HGB 7.6* 11.2*  HCT 24.1* 33.3*  PLT 82* 96*  LABPROT 14.8  --   INR 1.2  --   CREATININE 6.59* 7.48*    Estimated Creatinine Clearance: 7.3 mL/min (A) (by C-G formula based on SCr of 7.48 mg/dL (H)).   Medical History: Past Medical History:  Diagnosis Date   Complication of anesthesia    Enlarged liver    secondary to PKD   ESRD on hemodialysis ([place])    MWF [place]   Heart murmur    "slight" per ? Dr Deatra Ina 30 years ago. 2D ECHO  30 yearsa go.   History of blood transfusion    C- Section   History of bronchitis    numerous, last time> 1 year   Hypertension    Night muscle spasms    legs   Polycystic kidney disease    Genetic dx 23 years ago   PONV (postoperative nausea and vomiting)    patch helped   Scoliosis    Strabismus     Medications:  Scheduled:   acetaminophen  1,000 mg Oral Q6H   atorvastatin  40 mg Oral QHS   carvedilol  12.5 mg Oral BID   Chlorhexidine Gluconate Cloth  6 each Topical Q0600   cinacalcet  30 mg Oral Q supper   docusate sodium  100 mg Oral BID   ferric citrate  210 mg Oral TID WC   isosorbide mononitrate  30 mg Oral QHS   pantoprazole  40 mg Oral BID    Assessment: 89 yof who is s/p fall, found to have supracondylar distal femur facture now s/p ORIF. No AC PTA. Of note, pt has hx of ESRD.  Plan for ortho to do apixaban for DVT prophylaxis. Manufacturer doesn't recommend  any dose adjustment given ESRD status.   Goal of Therapy:  Monitor platelets by anticoagulation protocol: Yes   Plan:  Apixaban 2.5 mg BID  Monitor for s/sx of bleeding, CBC   Antonietta Jewel, PharmD, BCCCP Clinical Pharmacist  Phone: [phone] 08/30/2021 3:39 PM  Please check AMION for all [facility] phone numbers After 10:00 PM, call [place] [phone]

## 2021-08-30 NOTE — Consult Note (Signed)
Initial Consultation Note   Patient: Kendra Clay HER:[id number] DOB: [birth year] PCP: Patient, [name] DOA: 08/29/2021 DOS: the patient was seen and examined on 08/30/2021 Primary service: Dory Horn, MD  Referring physician: Thereasa Solo, PA-Orthopedic surgery. Reason for consult: Medical management  Assessment/Plan:  Left supracondylar distal femur fracture/right open tibia shaft fracture/left distal radius and ulna fracture post mechanical fall, status post repair on 08/29/2021 by orthopedic surgery Dr. Doreatha Martin. Management per orthopedic surgery. Pain control and bowel regimen in place Consider holding off Tramadol 50 mg BID in ESRD due to risk of accumulation of active metabolites.  Acute blood loss anemia post orthopedic surgery/anemia of chronic disease Baseline hemoglobin 10 Hemoglobin 7.6 K  Repeat CBC and transfuse hemoglobin less than 7.0. The patient consented to blood transfusion if needed.  Pancytopenia  WBC 2.6, hemoglobin 7.6, platelet count 82 Repeat CBC with differential in the morning Closely monitor  Chronic combined diastolic and systolic CHF Last 2D echo done on 08/15/2015 revealed LVEF 40% with grade 1 diastolic dysfunction. Volume status and electrolytes managed with hemodialysis. Start strict I's and O's and daily weight.  ESRD on HD MWF Consult nephrology in the morning to resume hemodialysis home schedule. Volume status and electrolytes managed with hemodialysis.  Hypokalemia Serum potassium 3.3 Repleted judiciously in the setting of ESRD with p.o. KCl 20 mEq x 1.  Ambulatory dysfunction post orthopedic surgical repair PT/OT evaluation under the guidance of orthopedic surgery Fall precautions TOC consulted to assist with dc planning   [facility] will continue to follow the patient.  Thank you for allowing [place] to participate in the care of this patient.  HPI: Kendra Clay is a 61 y.o. female with past medical history of ESRD on HD MWF,  essential hypertension, coronary artery disease status post stent placement in 2017, dilated cardiomyopathy, chronic combined diastolic and systolic CHF, who initially presented to [facility] ED after a ground-level fall while walking up the stairs.  She incurred multiple fractures, to her left supracondylar distal femur fracture, right tibia shaft fracture, left distal radius and ulna fracture.  The patient underwent repair of those fractures by orthopedic surgery Dr. Doreatha Martin on 08/29/2021.    On 08/30/2021, around midnight, [facility], hospitalist service, was consulted for medical management.  Review of Systems: As mentioned in the history of present illness. All other systems reviewed and are negative. 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 Dr Deatra Ina 30 years ago. 2D ECHO  30 yearsa go.   History of blood transfusion    C- Section   History of bronchitis    numerous, last time> 1 year   Hypertension    Night muscle spasms    legs   Polycystic kidney disease    Genetic dx 23 years ago   PONV (postoperative nausea and vomiting)    patch helped   Scoliosis    Strabismus    Past Surgical History:  Procedure Laterality Date   ABDOMINAL HYSTERECTOMY     AV FISTULA PLACEMENT  09/01/2011   Procedure: ARTERIOVENOUS (AV) FISTULA CREATION;  Surgeon: Rosetta Posner, MD;  Location: [place];  Service: Vascular;  Laterality: Right;   [place]     for lazy eye   TIBIA IM NAIL INSERTION Left 05/19/2018   Procedure: INTRAMEDULLARY (IM) NAIL TIBIAL;  Surgeon: Hiram Gash, MD;  Location: [place];  Service: Orthopedics;  Laterality: Left;   Social History:  reports that she quit smoking about 6 years ago. Her smoking use included cigarettes. She started smoking about 41 years ago. She has a 45.00 pack-year smoking history. She quit smokeless tobacco use about 7 years ago. She reports that she  does not drink alcohol and does not use drugs.  Allergies  Allergen Reactions   E-Mycin [Erythromycin] Hives   Sulfa Antibiotics Rash    Other reaction(s): Skin Rash Other reaction(s): Skin Rash    Lisinopril Cough    Family History  Problem Relation Age of Onset   Arthritis Mother    Kidney disease Father    Polycystic kidney disease Son    Asthma Son    Hypertension Maternal Grandmother     Prior to Admission medications   Medication Sig Start Date End Date Taking? Authorizing Provider  acetaminophen (TYLENOL) 500 MG tablet Take 2 tablets (1,000 mg total) by mouth every 8 (eight) hours. Patient taking differently: Take 1,000 mg by mouth every 8 (eight) hours as needed for mild pain or headache. 05/20/18  Yes Emokpae, Courage, MD  atorvastatin (LIPITOR) 40 MG tablet Take 40 mg by mouth at bedtime.   Yes [provider]  carvedilol (COREG) 12.5 MG tablet Take 12.5 mg by mouth 2 (two) times daily. 06/11/17  Yes [provider]  cinacalcet (SENSIPAR) 30 MG tablet Take 30 mg by mouth daily with supper.   Yes [provider]  dextromethorphan-guaiFENesin (MUCINEX DM) 30-600 MG 12hr tablet Take 2 tablets by mouth as needed for cough.   Yes [provider]  ferric citrate (AURYXIA) 1 GM 210 MG(Fe) tablet Take 210 mg by mouth 3 (three) times daily with meals.   Yes [provider]  isosorbide mononitrate (IMDUR) 30 MG 24 hr tablet Take 30 mg by mouth at bedtime. 08/14/16  Yes [provider]  loperamide (IMODIUM A-D) 2 MG tablet Take 2 mg by mouth as needed for diarrhea or loose stools.   Yes [provider]  loratadine (CLARITIN) 10 MG tablet Take 10 mg by mouth daily as needed for allergies.   Yes [provider]  losartan (COZAAR) 25 MG tablet Take 25 mg by mouth daily. 05/05/18  Yes [provider]  pantoprazole (PROTONIX) 40 MG tablet Take 40 mg by mouth 2 (two) times daily. 02/26/20  Yes [provider]  B Complex-C-Zn-Folic Acid (DIALYVITE 341 WITH ZINC) 0.8 MG TABS Take 1 tablet by mouth at bedtime. 08/30/18   [provider]  estradiol (ESTRACE) 0.1 MG/GM vaginal cream Place 1 Applicatorful vaginally at bedtime as needed (yeast infection). 03/30/20   [provider]  lidocaine (LIDODERM) 5 % Place 1 patch onto the skin daily. Remove & Discard patch within 12 hours or as directed by MD Patient not taking: Reported on 07/31/2020 04/26/20   Jonetta Osgood, MD  nitroGLYCERIN (NITROSTAT) 0.4 MG SL tablet Place 0.4 mg under the tongue every 5 (five) minutes as needed for chest pain.    [provider]  polyethylene glycol powder (MIRALAX) powder Start taking 1 capful 3 times a day. Slowly cut back as needed until you have normal bowel movements. Patient taking differently: Take 17 g by mouth daily as needed for mild constipation. 08/17/17   Fatima Blank, MD  VENTOLIN HFA 108 [phone] Base) MCG/ACT inhaler INHALE 2 PUFFS INTO THE LUNGS EVERY 6 HOURS AS NEEDED 12/31/20   Spero Geralds, MD  Physical Exam: Vitals:   08/29/21 1940 08/29/21 2010 08/29/21 2029 08/29/21 2047  BP: (!) 147/87 (!) 159/79 133/66 (!) 151/89  Pulse: 84 81 81 79  Resp: 19 20 14 16   Temp:   97.6 F (36.4 C) 97.6 F (36.4 C)  TempSrc:    Oral  SpO2: 100% 96% 94% 100%  Weight:      Height:      General: Well developed and well nourished, in no acute distress.  Alert and noted x3. Cardiovascular: Regular rate and rhythm no rubs or gallops Pulmonary: Clear to auscultation no wheezes or rales Abdomen: Soft nontender normal bowel sounds present. Musculoskeletal: No lower extremity edema bilaterally.    Family Communication: None at bedside Primary team communication: Discussed with orthopedic surgery. Thank you very much for involving [place] in the care of your patient.  Author: Kayleen Memos, DO 08/30/2021 12:29 AM  For on call review [email or web address].

## 2021-08-30 NOTE — Discharge Instructions (Signed)

## 2021-08-30 NOTE — Progress Notes (Signed)
OT Cancellation Note  Patient Details Name: DELSIE AMADOR MRN: [id number] DOB: 1960/02/20   Cancelled Treatment:    Reason Eval/Treat Not Completed: Patient at procedure or test/ unavailable. Unable to complete OT evaluation. Pt receiving HD at this time. Will complete evaluation tomorrow AM.   Ailene Ravel, OTR/L,CBIS  Supplemental OT - [facility] and [facility] Dress  08/30/2021, 4:41 PM

## 2021-08-30 NOTE — Progress Notes (Signed)
Received patient in bed, alert and oriented.  On going HD. Informed consent signed and in chart.  Tx duration: 3.5H  HD treatment completed. Pt did not tolerate the tx. Fistula without signs and symptoms of complications. Venous access took 20 mins to stop bleeding. Left pt in her isolation room, alert, oriented and in no acute distress. Report given to bedside RN.  Total UF removed:1100 ml  Medication given: None  Post HD VS:BP 101/44mmhg , HR 76 bpm, RR 16brpm, sats 95 on RA, T 98.43F  Post HD weight: 64.7kg

## 2021-08-30 NOTE — Plan of Care (Signed)

## 2021-08-30 NOTE — Progress Notes (Addendum)
Kendra Clay, is a 61 y.o. female, DOB - 1960-02-10, JSE:[id number] Admit date - 08/29/2021    Outpatient Primary MD for the Kendra Clay is Kendra Clay, [name]  LOS - 1  days  Chief Complaint  Kendra Clay presents with   Fall       Brief summary   Kendra Clay is a 61 year old female with ESRD on HD MWF, HTN, CAD status post stent in 2017, chronic combined diastolic and systolic CHF presented after a ground-level fall while walking up the stairs.  Kendra Clay incurred multiple fractures, underwent repair by orthopedic surgery on 08/29/2021. Kendra Clay denied any syncopal episode, no chest pain or acute shortness of breath. At baseline lives at home with her son and ambulates with a cane.  Kendra Clay did report diarrheal illness in the last few days and had felt weak.  Assessment & Plan    Principal Problem: Left supracondylar distal femur fracture Right open tibial shaft fracture Left distal radius and ulna fracture -Status post OR on 7/27, underwent ORIF of left distal femur fracture, IM nailing of right tibial shaft fracture, ORIF left distal radius fracture, perk fixation of left distal ulna fracture irrigation and debridement of right open tibial fracture. -Management per orthopedics -Pain control, DVT prophylaxis as recommended by Ortho. -Check vitamin D, TSH, anemia panel   Active Problems: ESRD on hemodialysis MWF, history of polycystic kidney disease -Nephrology consulted, last dialysis on Wednesday before admission, will undergo HD today per her schedule  Incidental COVID-19 positive this morning -Asymptomatic, no hypoxia, will obtain portable chest x-ray, inflammatory markers -Had diarrheal illness last week otherwise no respiratory symptoms. -If inflammatory markers elevated or infiltrates, will start Paxlovid Addendum 2:00 p.m. Per Kendra Clay, she has COVID vaccination and boosters,  last booster was more than a year ago.  She thinks her son likely has [place] as well.  She has been feeling congested, nasal stuffiness.  Chest x-ray did not show any infiltrates -Awaiting inflammatory markers  Chronic HFrEF, CAD, stent in 2017 -No recent echo in epic, echo 2017 showed EF of 40-45%, grade 1 DD, follows cardiology, Dr Otho Perl in [place].  Per office note, Kendra Clay had 2D echo in 2022 which had shown normal left ventricular systolic function. -Currently compensated, volume management with HD -No acute chest pain or shortness of breath  Essential hypertension - will resume Imdur, Coreg  Acute on chronic anemia, anemia of chronic disease/ESRD - Presented with hemoglobin of 7.6, at baseline hemoglobin ~10 -Obtain anemia panel, will transfuse 2 units packed RBCs on 7/27  Pancytopenia with chronic thrombocytopenia -WBC count improving, transfused 2 units packed RBCs, hemoglobin improving to 11.2 -Has chronic thrombocytopenia, platelets 121 K in 04/2020, presented with platelet count of 82 K, improved to 96 K today   Code Status: Full CODE STATUS DVT Prophylaxis:  SCDs Start: 08/29/21 2116   Level of Care: Level of care: Med-Surg Family Communication: Updated Kendra Clay   Disposition Plan:      Remains inpatient appropriate: Will likely need SNF rehab with  multiple factors  Procedures:  Procedures: 08/29/2021 Open reduction internal fixation of left distal femur Intramedullary nailing of right tibial shaft fracture Open reduction internal fixation of left distal radius fracture Percutaneous fixation of left distal ulna fracture Irrigation and debridement of right open tibia fracture  Consultants:   Orthopedics  Antimicrobials:   Anti-infectives (From admission, onward)    Start     Dose/Rate Route Frequency Ordered Stop   08/29/21 2215  ceFAZolin (ANCEF) IVPB 2g/100 mL premix  Status:  Discontinued        2 g 200 mL/hr over 30 Minutes Intravenous Every 8 hours 08/29/21  2116 08/29/21 2140   08/29/21 2130  ceFAZolin (ANCEF) IVPB 1 g/50 mL premix        1 g 100 mL/hr over 30 Minutes Intravenous  Once 08/29/21 2042 08/29/21 2231   08/29/21 1642  vancomycin (VANCOCIN) powder  Status:  Discontinued          As needed 08/29/21 1643 08/29/21 1806   08/29/21 1445  ceFAZolin (ANCEF) IVPB 2g/100 mL premix  Status:  Discontinued        2 g 200 mL/hr over 30 Minutes Intravenous On call to O.R. 08/29/21 1427 08/29/21 1445   08/29/21 1445  ceFAZolin (ANCEF) IVPB 1 g/50 mL premix  Status:  Discontinued        1 g 100 mL/hr over 30 Minutes Intravenous Every 8 hours 08/29/21 1442 08/29/21 2041   08/29/21 1444  ceFAZolin (ANCEF) 1-4 GM/50ML-% IVPB       Note to Pharmacy: Bobbie Stack M: cabinet override      08/29/21 1444 08/29/21 1556   08/29/21 1345  ceFAZolin (ANCEF) IVPB 2g/100 mL premix  Status:  Discontinued        2 g 200 mL/hr over 30 Minutes Intravenous  Once 08/29/21 1340 08/29/21 1445          Medications  acetaminophen  1,000 mg Oral Q6H   atorvastatin  40 mg Oral QHS   carvedilol  12.5 mg Oral BID   Chlorhexidine Gluconate Cloth  6 each Topical Q0600   cinacalcet  30 mg Oral Q supper   docusate sodium  100 mg Oral BID   ferric citrate  210 mg Oral TID WC   isosorbide mononitrate  30 mg Oral QHS   pantoprazole  40 mg Oral BID      Subjective:   Sonita Michiels was seen and examined today AM.  Pain with moving otherwise doing well.  Multiple surgeries yesterday.  Kendra Clay denies dizziness, chest pain, shortness of breath, abdominal pain, N/V  Objective:   Vitals:   08/29/21 2047 08/30/21 0106 08/30/21 0116 08/30/21 0813  BP: (!) 151/89 (!) 138/57  (!) 143/89  Pulse: 79 63  78  Resp: 16 18  17   Temp: 97.6 F (36.4 C) 98.3 F (36.8 C)  98.2 F (36.8 C)  TempSrc: Oral Oral  Oral  SpO2: 100% 96%  98%  Weight:   66.3 kg   Height:        Intake/Output Summary (Last 24 hours) at 08/30/2021 1045 Last data filed at 08/30/2021 0600 Gross per  24 hour  Intake 1815.16 ml  Output 160 ml  Net 1655.16 ml     Wt Readings from Last 3 Encounters:  08/30/21 66.3 kg  07/31/20 57.7 kg  06/28/20 55.5 kg     Exam General: Alert and oriented x 3, NAD Cardiovascular: S1 S2 auscultated,  RRR Respiratory: Clear to auscultation bilaterally Gastrointestinal: Soft, nontender,  nondistended, NBS Ext: dressing intact left wrist, right immobilizer Neuro: no acute FND's Psych: Normal affect and demeanor, alert and oriented x3     Data Reviewed:  I have personally reviewed following labs    CBC Lab Results  Component Value Date   WBC 4.5 08/30/2021   RBC 3.89 08/30/2021   HGB 11.2 (L) 08/30/2021   HCT 33.3 (L) 08/30/2021   MCV 85.6 08/30/2021   MCH 28.8 08/30/2021   PLT 96 (L) 08/30/2021   MCHC 33.6 08/30/2021   RDW 15.1 08/30/2021   LYMPHSABS 0.6 (L) 08/30/2021   MONOABS 0.2 08/30/2021   EOSABS 0.0 08/30/2021   BASOSABS 0.0 35/00/9381     Last metabolic panel Lab Results  Component Value Date   NA 130 (L) 08/30/2021   K 4.7 08/30/2021   CL 91 (L) 08/30/2021   CO2 24 08/30/2021   BUN 31 (H) 08/30/2021   CREATININE 7.48 (H) 08/30/2021   GLUCOSE 133 (H) 08/30/2021   GFRNONAA 6 (L) 08/30/2021   GFRAA 8 (L) 05/20/2018   CALCIUM 7.5 (L) 08/30/2021   PHOS 5.0 (H) 08/30/2021   PROT 6.3 (L) 08/30/2021   ALBUMIN 2.7 (L) 08/30/2021   BILITOT 0.6 08/30/2021   ALKPHOS 153 (H) 08/30/2021   AST 23 08/30/2021   ALT 11 08/30/2021   ANIONGAP 15 08/30/2021    CBG (last 3)  No results for input(s): "GLUCAP" in the last 72 hours.    Coagulation Profile: Recent Labs  Lab 08/29/21 1213  INR 1.2     Radiology Studies: I have personally reviewed the imaging studies  DG Wrist 2 Views Left  Result Date: 08/29/2021 CLINICAL DATA:  Postop EXAM: LEFT WRIST - 2 VIEW COMPARISON:  08/29/2021 FINDINGS: Interval surgical plate and screw fixation of distal radius fracture with decreased displacement and overriding. External pin  fixation of the distal ulna fracture with residual close to 1/2 shaft diameter ulnar and dorsal displacement of distal fracture fragment. IMPRESSION: Interval surgical fixation of distal radius and ulna fracture Electronically Signed   By: Donavan Foil M.D.   On: 08/29/2021 19:10   DG Knee Left Port  Result Date: 08/29/2021 CLINICAL DATA:  Postop EXAM: PORTABLE LEFT KNEE - 1-2 VIEW COMPARISON:  08/29/2021 FINDINGS: Previous intramedullary rodding of the tibia. Interval surgical plate and multiple screw fixation of comminuted distal femoral fracture with decreased impaction and displacement. Gas in the soft tissues consistent with recent surgery. IMPRESSION: Interval surgical fixation of comminuted distal femoral fracture Electronically Signed   By: Donavan Foil M.D.   On: 08/29/2021 19:08   DG Tibia/Fibula Right Port  Result Date: 08/29/2021 CLINICAL DATA:  Postop EXAM: PORTABLE RIGHT TIBIA AND FIBULA - 2 VIEW COMPARISON:  08/29/2021 FINDINGS: Interval intramedullary rodding and screw fixation of comminuted midshaft tibial fracture with overall decreased displacement and angulation. Comminuted fracture involving the mid to distal shaft of the fibula with residual 1/2 shaft diameter lateral and anterior displacement of distal fracture fragment, overall improved. IMPRESSION: 1. Interval surgical fixation of comminuted tibial shaft fracture with decreased angulation and displacement 2. Comminuted fibular shaft fracture with overall decreased displacement and angulation Electronically Signed   By: Donavan Foil M.D.   On: 08/29/2021 19:07   DG Tibia/Fibula Right  Result Date: 08/29/2021 CLINICAL DATA:  Tibial fracture EXAM: RIGHT TIBIA AND FIBULA - 2 VIEW COMPARISON:  Same day tibia/fibula radiographs FINDINGS: Five C-arm fluoroscopic images were obtained intraoperatively and submitted for post operative interpretation. Initial image demonstrates the displaced and comminuted tibial  and fibular diaphyseal  fractures. Subsequent images demonstrate intramedullary rod and screw fixation of the tibia with improved fracture alignment. The fibular fracture alignment is also improved on the final images. Total dose 3.5 mGy total fluoro time 176.7 seconds. Please see the performing provider's procedural report for further detail. IMPRESSION: Intraoperative images during surgical fixation of the tibial diaphyseal fracture as above. Electronically Signed   By: Valetta Mole M.D.   On: 08/29/2021 18:50   DG Knee Complete 4 Views Left  Result Date: 08/29/2021 CLINICAL DATA:  Femur fracture. EXAM: LEFT KNEE - COMPLETE 4+ VIEW COMPARISON:  Same-day knee radiographs FINDINGS: Six C-arm fluoroscopic images were obtained intraoperatively and submitted for post operative interpretation. The initial image demonstrates similar alignment of the comminuted distal femoral fracture. Subsequent images demonstrate sideplate and screw fixation of the fracture with improved alignment. Hardware alignment is within expected limits, without evidence of complication. Total fluoro time 176.7 seconds, dose 3.5 mGy. Please see the performing provider's procedural report for further detail. IMPRESSION: Intraoperative images during surgical fixation of the distal femoral fracture as above. Electronically Signed   By: Valetta Mole M.D.   On: 08/29/2021 18:48   DG Wrist Complete Left  Result Date: 08/29/2021 CLINICAL DATA:  Wrist fracture. EXAM: LEFT WRIST - COMPLETE 3+ VIEW COMPARISON:  Same-day forearm radiographs FINDINGS: Six C-arm fluoroscopic images were obtained intraoperatively and submitted for post operative interpretation. The initial image demonstrates similar alignment of the distal radial and ulnar fractures. Subsequent images demonstrate sideplate and screw fixation of the distal radial fracture with improved alignment. The final images demonstrate pin fixation across the ulnar fracture with improved alignment. Fluoro time 176.7  seconds. Dose 3.5 mGy. Please see the performing provider's procedural report for further detail. IMPRESSION: Intraoperative images during surgical fixation of the distal radial and ulnar fractures as above. Electronically Signed   By: Valetta Mole M.D.   On: 08/29/2021 18:47   DG C-Arm 1-60 Min-No Report  Result Date: 08/29/2021 Fluoroscopy was utilized by the requesting physician.  No radiographic interpretation.   DG C-Arm 1-60 Min-No Report  Result Date: 08/29/2021 Fluoroscopy was utilized by the requesting physician.  No radiographic interpretation.   DG C-Arm 1-60 Min-No Report  Result Date: 08/29/2021 Fluoroscopy was utilized by the requesting physician.  No radiographic interpretation.   DG Knee Left Port  Result Date: 08/29/2021 CLINICAL DATA:  Fall, pain EXAM: PORTABLE LEFT KNEE - 1-2 VIEW COMPARISON:  None Available. FINDINGS: Generalized osteopenia. Comminuted fracture of the distal femoral metaphysis with apex anterior angulation and posterior angulation. No aggressive osseous lesion. Normal alignment. Intramedullary nail in the proximal tibia. Soft tissue are unremarkable. No radiopaque foreign body or soft tissue emphysema. IMPRESSION: 1. Generalized osteopenia. Comminuted fracture of the distal femoral metaphysis with apex anterior angulation and posterior angulation. Electronically Signed   By: Kathreen Devoid M.D.   On: 08/29/2021 13:18   DG Chest Port 1 View  Result Date: 08/29/2021 CLINICAL DATA:  [place]. EXAM: PORTABLE CHEST 1 VIEW COMPARISON:  04/24/2020 FINDINGS: The cardiac silhouette, mediastinal and hilar contours are within normal limits given the AP projection and portable technique. Chronic emphysematous changes and pulmonary scarring but no acute overlying pulmonary process. No pleural effusions or pneumothorax. The bony thorax is grossly intact. No obvious acute rib fractures. Stable thoracic spine scoliosis. IMPRESSION: Chronic lung changes but no acute pulmonary  findings. Electronically Signed   By: Marijo Sanes M.D.   On: 08/29/2021 13:13   DG Forearm Left  Result Date:  08/29/2021 CLINICAL DATA:  Fall, pain EXAM: LEFT FOREARM - 2 VIEW COMPARISON:  None Available. FINDINGS: There are transverse fractures through the distal radial and ulnar metaphyses. There is dorsal displacement of the distal fragments with approximately 1.2 cm dorsal displacement and 1.2 cm overlap of the distal radial fragment. There is mild radial displacement and angulation of the distal fragments. There is overlying soft tissue swelling. IMPRESSION: Displaced transverse fractures through the distal radial and ulnar metaphyses as above. Electronically Signed   By: Valetta Mole M.D.   On: 08/29/2021 13:10   DG Tibia/Fibula Right Port  Result Date: 08/29/2021 CLINICAL DATA:  Fall from standing position.  Pain. EXAM: PORTABLE RIGHT TIBIA AND FIBULA - 2 VIEW COMPARISON:  None Available. FINDINGS: Comminuted transverse tibia and fibular fractures are noted in the midshaft. Lateral and posterior displacement is noted. The knee and ankle joints are located. IMPRESSION: 1. Comminuted transverse midshaft tibia and fibular fractures as described. 2. Lateral and posterior displacement. Electronically Signed   By: San Morelle M.D.   On: 08/29/2021 13:08       Tammy Wickliffe M.D. [facility] 08/30/2021, 10:45 AM  Available via Epic secure chat 7am-7pm After 7 pm, please refer to night coverage provider listed on amion.

## 2021-08-30 NOTE — Progress Notes (Signed)
Spoke to Ortho tech needs clarification with MD this am about trapeze and over head bed frame due to risk for injury with upper extremity injury.

## 2021-08-31 DIAGNOSIS — S82201B Unspecified fracture of shaft of right tibia, initial encounter for open fracture type I or II: Secondary | ICD-10-CM | POA: Diagnosis not present

## 2021-08-31 DIAGNOSIS — S52502A Unspecified fracture of the lower end of left radius, initial encounter for closed fracture: Secondary | ICD-10-CM

## 2021-08-31 DIAGNOSIS — S72402A Unspecified fracture of lower end of left femur, initial encounter for closed fracture: Secondary | ICD-10-CM | POA: Diagnosis not present

## 2021-08-31 DIAGNOSIS — I5022 Chronic systolic (congestive) heart failure: Secondary | ICD-10-CM | POA: Diagnosis not present

## 2021-08-31 LAB — BASIC METABOLIC PANEL
Anion gap: 12 (ref 5–15)
BUN: 20 mg/dL (ref 6–20)
CO2: 24 mmol/L (ref 22–32)
Calcium: 6.7 mg/dL — ABNORMAL LOW (ref 8.9–10.3)
Chloride: 95 mmol/L — ABNORMAL LOW (ref 98–111)
Creatinine, Ser: 4.87 mg/dL — ABNORMAL HIGH (ref 0.44–1.00)
GFR, Estimated: 10 mL/min — ABNORMAL LOW (ref >60)
Glucose, Bld: 109 mg/dL — ABNORMAL HIGH (ref 70–99)
Potassium: 3.4 mmol/L — ABNORMAL LOW (ref 3.5–5.1)
Sodium: 131 mmol/L — ABNORMAL LOW (ref 135–145)

## 2021-08-31 LAB — CBC
HCT: 28 % — ABNORMAL LOW (ref 36.0–46.0)
HCT: 29.2 % — ABNORMAL LOW (ref 36.0–46.0)
Hemoglobin: 9.3 g/dL — ABNORMAL LOW (ref 12.0–15.0)
Hemoglobin: 9.7 g/dL — ABNORMAL LOW (ref 12.0–15.0)
MCH: 28.7 pg (ref 26.0–34.0)
MCH: 29.1 pg (ref 26.0–34.0)
MCHC: 33.2 g/dL (ref 30.0–36.0)
MCHC: 33.2 g/dL (ref 30.0–36.0)
MCV: 86.4 fL (ref 80.0–100.0)
MCV: 87.7 fL (ref 80.0–100.0)
Platelets: 92 10*3/uL — ABNORMAL LOW (ref 150–400)
Platelets: 100 10*3/uL — ABNORMAL LOW (ref 150–400)
RBC: 3.24 MIL/uL — ABNORMAL LOW (ref 3.87–5.11)
RBC: 3.33 MIL/uL — ABNORMAL LOW (ref 3.87–5.11)
RDW: 15.2 % (ref 11.5–15.5)
RDW: 15.3 % (ref 11.5–15.5)
WBC: 5 10*3/uL (ref 4.0–10.5)
WBC: 6.3 10*3/uL (ref 4.0–10.5)
nRBC: 0 % (ref 0.0–0.2)
nRBC: 0 % (ref 0.0–0.2)

## 2021-08-31 LAB — LACTIC ACID, PLASMA: Lactic Acid, Venous: 2.1 mmol/L (ref 0.5–1.9)

## 2021-08-31 LAB — HEPATITIS B SURFACE ANTIBODY, QUANTITATIVE: Hep B S AB Quant (Post): 149.4 m[IU]/mL (ref >9.9)

## 2021-08-31 MED ORDER — SODIUM CHLORIDE 0.9 % IV BOLUS
250.0000 mL | Freq: Once | INTRAVENOUS | Status: AC
  Administered 2021-08-31: 250 mL via INTRAVENOUS

## 2021-08-31 MED ORDER — PREDNISONE 20 MG PO TABS
40.0000 mg | ORAL_TABLET | Freq: Every day | ORAL | Status: AC
  Administered 2021-08-31 – 2021-09-02 (×3): 40 mg via ORAL
  Filled 2021-08-31 (×2): qty 2

## 2021-08-31 MED ORDER — POTASSIUM CHLORIDE CRYS ER 20 MEQ PO TBCR
40.0000 meq | EXTENDED_RELEASE_TABLET | Freq: Once | ORAL | Status: AC
  Administered 2021-08-31: 40 meq via ORAL
  Filled 2021-08-31: qty 2

## 2021-08-31 MED ORDER — VITAMIN D (ERGOCALCIFEROL) 1.25 MG (50000 UNIT) PO CAPS
50000.0000 [IU] | ORAL_CAPSULE | ORAL | Status: DC
  Administered 2021-08-31: 50000 [IU] via ORAL
  Filled 2021-08-31 (×2): qty 1

## 2021-08-31 MED ORDER — CARVEDILOL 3.125 MG PO TABS: 3.1250 mg | ORAL_TABLET | Freq: Two times a day (BID) | ORAL | Status: DC

## 2021-08-31 NOTE — Plan of Care (Signed)
  Problem: Education: Goal: Knowledge of General Education information will improve Description: Including pain rating scale, medication(s)/side effects and non-pharmacologic comfort measures Outcome: Progressing   Problem: Clinical Measurements: Goal: Will remain free from infection Outcome: Progressing Goal: Diagnostic test results will improve Outcome: Progressing   

## 2021-08-31 NOTE — Progress Notes (Signed)
Kendra Clay, is a 61 y.o. female, DOB - 1960/03/25, XTK:[id number] Admit date - 08/29/2021    Outpatient Primary MD for the Kendra Clay is Kendra Clay, [name]  LOS - 2  days  Chief Complaint  Kendra Clay presents with   Fall       Brief summary   Kendra Clay is a 61 year old female with ESRD on HD MWF, HTN, CAD status post stent in 2017, chronic combined diastolic and systolic CHF presented after a ground-level fall while walking up the stairs.  Kendra Clay incurred multiple fractures, underwent repair by orthopedic surgery on 08/29/2021. Kendra Clay denied any syncopal episode, no chest pain or acute shortness of breath. At baseline lives at home with her son and ambulates with a cane.  Kendra Clay did report diarrheal illness in the last few days and had felt weak.  Assessment & Plan    Principal Problem: Left supracondylar distal femur fracture Right open tibial shaft fracture Left distal radius and ulna fracture -Status post OR on 7/27, underwent ORIF of left distal femur fracture, IM nailing of right tibial shaft fracture, ORIF left distal radius fracture, perk fixation of left distal ulna fracture irrigation and debridement of right open tibial fracture. -Management per orthopedics -Pain control, DVT prophylaxis as recommended by Ortho.  Active problems  ESRD on hemodialysis MWF, history of polycystic kidney disease -Nephrology consulted, HD per renal  COVID-19  -CRP 1.0, D-dimer 2.82, no acute infiltrates, however overnight developed hypoxia, was placed on 3 L O2.  Has diarrhea -Started on molnupiravir on 7/28, will place on prednisone with quick taper  Vitamin D deficiency Vitamin D level 14.0, started on vitamin D 50,000 units weekly  Chronic HFrEF, CAD, stent in 2017 -No recent echo in epic, echo 2017 showed EF of 40-45%, grade 1 DD, follows cardiology, Dr Otho Perl in [place].  Per  office note, Kendra Clay had 2D echo in 2022 which had shown normal left ventricular systolic function. -Currently compensated, volume management with HD   Essential hypertension -BP soft, hold Imdur, Coreg  Acute on chronic anemia, anemia of chronic disease/ESRD - Presented with hemoglobin of 7.6, at baseline hemoglobin ~10 -Kendra Clay was transfused 2 units on 7/27, hemoglobin stable at 9.3 today  Pancytopenia with chronic thrombocytopenia -WBC count improving, hemoglobin 9.3, platelets 92 K, has chronic thrombocytopenia   Code Status: Full CODE STATUS DVT Prophylaxis:  apixaban (ELIQUIS) tablet 2.5 mg Start: 08/30/21 2200 SCDs Start: 08/29/21 2116 apixaban (ELIQUIS) tablet 2.5 mg   Level of Care: Level of care: Med-Surg Family Communication: Updated Kendra Clay   Disposition Plan:      Remains inpatient appropriate: PT recommended CIR, placed consult   Procedures:  Procedures: 08/29/2021 Open reduction internal fixation of left distal femur Intramedullary nailing of right tibial shaft fracture Open reduction internal fixation of left distal radius fracture Percutaneous fixation of left distal ulna fracture Irrigation and debridement of right open tibia fracture  Consultants:   Orthopedics  Antimicrobials:   Anti-infectives (From admission, onward)    Start  Dose/Rate Route Frequency Ordered Stop   08/30/21 2200  molnupiravir EUA (LAGEVRIO) capsule 800 mg        4 capsule Oral 2 times daily 08/30/21 1727 09/04/21 1830   08/29/21 2215  ceFAZolin (ANCEF) IVPB 2g/100 mL premix  Status:  Discontinued        2 g 200 mL/hr over 30 Minutes Intravenous Every 8 hours 08/29/21 2116 08/29/21 2140   08/29/21 2130  ceFAZolin (ANCEF) IVPB 1 g/50 mL premix        1 g 100 mL/hr over 30 Minutes Intravenous  Once 08/29/21 2042 08/29/21 2231   08/29/21 1642  vancomycin (VANCOCIN) powder  Status:  Discontinued          As needed 08/29/21 1643 08/29/21 1806   08/29/21 1445  ceFAZolin (ANCEF)  IVPB 2g/100 mL premix  Status:  Discontinued        2 g 200 mL/hr over 30 Minutes Intravenous On call to O.R. 08/29/21 1427 08/29/21 1445   08/29/21 1445  ceFAZolin (ANCEF) IVPB 1 g/50 mL premix  Status:  Discontinued        1 g 100 mL/hr over 30 Minutes Intravenous Every 8 hours 08/29/21 1442 08/29/21 2041   08/29/21 1444  ceFAZolin (ANCEF) 1-4 GM/50ML-% IVPB       Note to Pharmacy: Bobbie Stack M: cabinet override      08/29/21 1444 08/29/21 1556   08/29/21 1345  ceFAZolin (ANCEF) IVPB 2g/100 mL premix  Status:  Discontinued        2 g 200 mL/hr over 30 Minutes Intravenous  Once 08/29/21 1340 08/29/21 1445          Medications  apixaban  2.5 mg Oral BID   atorvastatin  40 mg Oral QHS   Chlorhexidine Gluconate Cloth  6 each Topical Q0600   docusate sodium  100 mg Oral BID   ferric citrate  210 mg Oral TID WC   molnupiravir EUA  4 capsule Oral BID   pantoprazole  40 mg Oral BID   predniSONE  40 mg Oral QAC breakfast      Subjective:   Makila Colombe was seen and examined today AM.  Not feeling too well today, overnight was placed on 3 L O2 via [place], stated that her oxygen saturations were lower.  Feeling somewhat emotional today.  Has pain on minimal movement.  Objective:   Vitals:   08/30/21 2115 08/30/21 2133 08/31/21 0500 08/31/21 0919  BP: (!) 85/57 (!) 101/54 (!) 115/58 124/60  Pulse: 73 76 82 89  Resp: 12 14  16   Temp:  98.7 F (37.1 C) 98.3 F (36.8 C) 98.3 F (36.8 C)  TempSrc:   Oral Oral  SpO2: 98% 94%  100%  Weight:  64.7 kg 69.8 kg   Height:        Intake/Output Summary (Last 24 hours) at 08/31/2021 1354 Last data filed at 08/31/2021 1006 Gross per 24 hour  Intake 480 ml  Output 1100 ml  Net -620 ml     Wt Readings from Last 3 Encounters:  08/31/21 69.8 kg  07/31/20 57.7 kg  06/28/20 55.5 kg    Physical Exam General: Alert and oriented x 3, NAD Cardiovascular: S1 S2 clear, RRR Respiratory: CTAB, no wheezing Gastrointestinal: Soft,  nontender, nondistended, NBS Ext: left upper extremity splint, left lower extremity dressings intact Psych: Normal affect and demeanor, alert and oriented x3    Data Reviewed:  I have personally reviewed following labs    CBC Lab  Results  Component Value Date   WBC 5.0 08/31/2021   RBC 3.24 (L) 08/31/2021   HGB 9.3 (L) 08/31/2021   HCT 28.0 (L) 08/31/2021   MCV 86.4 08/31/2021   MCH 28.7 08/31/2021   PLT 92 (L) 08/31/2021   MCHC 33.2 08/31/2021   RDW 15.2 08/31/2021   LYMPHSABS 0.6 (L) 08/30/2021   MONOABS 0.2 08/30/2021   EOSABS 0.0 08/30/2021   BASOSABS 0.0 35/32/9924     Last metabolic panel Lab Results  Component Value Date   NA 131 (L) 08/31/2021   K 3.4 (L) 08/31/2021   CL 95 (L) 08/31/2021   CO2 24 08/31/2021   BUN 20 08/31/2021   CREATININE 4.87 (H) 08/31/2021   GLUCOSE 109 (H) 08/31/2021   GFRNONAA 10 (L) 08/31/2021   GFRAA 8 (L) 05/20/2018   CALCIUM 6.7 (L) 08/31/2021   PHOS 5.0 (H) 08/30/2021   PROT 6.3 (L) 08/30/2021   ALBUMIN 2.7 (L) 08/30/2021   BILITOT 0.6 08/30/2021   ALKPHOS 153 (H) 08/30/2021   AST 23 08/30/2021   ALT 11 08/30/2021   ANIONGAP 12 08/31/2021    CBG (last 3)  No results for input(s): "GLUCAP" in the last 72 hours.    Coagulation Profile: Recent Labs  Lab 08/29/21 1213  INR 1.2     Radiology Studies: I have personally reviewed the imaging studies  DG CHEST PORT 1 VIEW  Result Date: 08/30/2021 CLINICAL DATA:  COVID-19 EXAM: PORTABLE CHEST 1 VIEW COMPARISON:  Radiographs 08/29/2021 FINDINGS: Chronic emphysematous changes. Bandlike opacities in the right lower lung may represent atelectasis or scarring. Otherwise no focal consolidation, pleural effusion, or pneumothorax. Cardiomediastinal silhouette at the upper limits of normal. Vascular stent right axilla. No acute osseous abnormality. IMPRESSION: Chronic emphysematous changes.  No acute cardiopulmonary process. Electronically Signed   By: Placido Sou M.D.   On:  08/30/2021 13:14   DG Wrist 2 Views Left  Result Date: 08/29/2021 CLINICAL DATA:  Postop EXAM: LEFT WRIST - 2 VIEW COMPARISON:  08/29/2021 FINDINGS: Interval surgical plate and screw fixation of distal radius fracture with decreased displacement and overriding. External pin fixation of the distal ulna fracture with residual close to 1/2 shaft diameter ulnar and dorsal displacement of distal fracture fragment. IMPRESSION: Interval surgical fixation of distal radius and ulna fracture Electronically Signed   By: Donavan Foil M.D.   On: 08/29/2021 19:10   DG Knee Left Port  Result Date: 08/29/2021 CLINICAL DATA:  Postop EXAM: PORTABLE LEFT KNEE - 1-2 VIEW COMPARISON:  08/29/2021 FINDINGS: Previous intramedullary rodding of the tibia. Interval surgical plate and multiple screw fixation of comminuted distal femoral fracture with decreased impaction and displacement. Gas in the soft tissues consistent with recent surgery. IMPRESSION: Interval surgical fixation of comminuted distal femoral fracture Electronically Signed   By: Donavan Foil M.D.   On: 08/29/2021 19:08   DG Tibia/Fibula Right Port  Result Date: 08/29/2021 CLINICAL DATA:  Postop EXAM: PORTABLE RIGHT TIBIA AND FIBULA - 2 VIEW COMPARISON:  08/29/2021 FINDINGS: Interval intramedullary rodding and screw fixation of comminuted midshaft tibial fracture with overall decreased displacement and angulation. Comminuted fracture involving the mid to distal shaft of the fibula with residual 1/2 shaft diameter lateral and anterior displacement of distal fracture fragment, overall improved. IMPRESSION: 1. Interval surgical fixation of comminuted tibial shaft fracture with decreased angulation and displacement 2. Comminuted fibular shaft fracture with overall decreased displacement and angulation Electronically Signed   By: Donavan Foil M.D.   On: 08/29/2021 19:07  DG Tibia/Fibula Right  Result Date: 08/29/2021 CLINICAL DATA:  Tibial fracture EXAM: RIGHT  TIBIA AND FIBULA - 2 VIEW COMPARISON:  Same day tibia/fibula radiographs FINDINGS: Five C-arm fluoroscopic images were obtained intraoperatively and submitted for post operative interpretation. Initial image demonstrates the displaced and comminuted tibial and fibular diaphyseal fractures. Subsequent images demonstrate intramedullary rod and screw fixation of the tibia with improved fracture alignment. The fibular fracture alignment is also improved on the final images. Total dose 3.5 mGy total fluoro time 176.7 seconds. Please see the performing provider's procedural report for further detail. IMPRESSION: Intraoperative images during surgical fixation of the tibial diaphyseal fracture as above. Electronically Signed   By: Valetta Mole M.D.   On: 08/29/2021 18:50   DG Knee Complete 4 Views Left  Result Date: 08/29/2021 CLINICAL DATA:  Femur fracture. EXAM: LEFT KNEE - COMPLETE 4+ VIEW COMPARISON:  Same-day knee radiographs FINDINGS: Six C-arm fluoroscopic images were obtained intraoperatively and submitted for post operative interpretation. The initial image demonstrates similar alignment of the comminuted distal femoral fracture. Subsequent images demonstrate sideplate and screw fixation of the fracture with improved alignment. Hardware alignment is within expected limits, without evidence of complication. Total fluoro time 176.7 seconds, dose 3.5 mGy. Please see the performing provider's procedural report for further detail. IMPRESSION: Intraoperative images during surgical fixation of the distal femoral fracture as above. Electronically Signed   By: Valetta Mole M.D.   On: 08/29/2021 18:48   DG Wrist Complete Left  Result Date: 08/29/2021 CLINICAL DATA:  Wrist fracture. EXAM: LEFT WRIST - COMPLETE 3+ VIEW COMPARISON:  Same-day forearm radiographs FINDINGS: Six C-arm fluoroscopic images were obtained intraoperatively and submitted for post operative interpretation. The initial image demonstrates similar  alignment of the distal radial and ulnar fractures. Subsequent images demonstrate sideplate and screw fixation of the distal radial fracture with improved alignment. The final images demonstrate pin fixation across the ulnar fracture with improved alignment. Fluoro time 176.7 seconds. Dose 3.5 mGy. Please see the performing provider's procedural report for further detail. IMPRESSION: Intraoperative images during surgical fixation of the distal radial and ulnar fractures as above. Electronically Signed   By: Valetta Mole M.D.   On: 08/29/2021 18:47   DG C-Arm 1-60 Min-No Report  Result Date: 08/29/2021 Fluoroscopy was utilized by the requesting physician.  No radiographic interpretation.   DG C-Arm 1-60 Min-No Report  Result Date: 08/29/2021 Fluoroscopy was utilized by the requesting physician.  No radiographic interpretation.   DG C-Arm 1-60 Min-No Report  Result Date: 08/29/2021 Fluoroscopy was utilized by the requesting physician.  No radiographic interpretation.       Estill Cotta M.D. [facility] 08/31/2021, 1:54 PM  Available via Epic secure chat 7am-7pm After 7 pm, please refer to night coverage provider listed on amion.

## 2021-08-31 NOTE — Progress Notes (Signed)
Mobility Specialist Progress Note:   08/31/21 1545  Mobility  Activity Transferred from chair to bed  Level of Assistance Moderate assist, patient does 50-74% (+2)  Assistive Device Front wheel walker (platform)  RLE Weight Bearing WBAT  LLE Weight Bearing WBAT  Distance Ambulated (ft) 2 ft  Activity Response Tolerated fair  $Mobility charge 1 Mobility   Pt eager to get back to bed. Required modA+2 to stand from recliner and pivot to chair. Pt states she feels much better after RLE re-dressing. Pt left in bed with all needs met.   Nelta Numbers Acute Rehab Secure Chat or Office Phone: [phone]

## 2021-08-31 NOTE — Plan of Care (Signed)

## 2021-08-31 NOTE — Progress Notes (Signed)
Orthopaedic Trauma Progress Note  S: Patient working with therapy this moring. They got her moved to the recliner.  She states that the pain in the wrist is most significant.  Left leg hurts more than right leg. Denies distal n/t. Dressings changed to BLE today. All incisions healing well.  O:  Vitals:   08/31/21 0500 08/31/21 0919  BP: (!) 115/58 124/60  Pulse: 82 89  Resp:  16  Temp: 98.3 F (36.8 C) 98.3 F (36.8 C)  SpO2:  100%    Right lower extremity: Boot is in place.  Is clean dry and intact.  Dressing is in good position patient was able to weight-bear.  She is neurovascular intact  Left lower extremity: Dressings are clean dry and intact.  Good knee range of motion.  Compartments are soft compressible.  Motor and sensory function intact.  Left upper extremity: Splint is in place clean dry and intact.  Motor and sensory function intact.  Imaging: Stable postoperative imaging  Labs:  Results for orders placed or performed during the hospital encounter of 08/29/21 (from the past 24 hour(s))  D-dimer, quantitative     Status: Abnormal   Collection Time: 08/30/21  2:41 PM  Result Value Ref Range   D-Dimer, Quant 2.82 (H) 0.00 - 0.50 ug/mL-FEU  CBC     Status: Abnormal   Collection Time: 08/31/21  7:19 AM  Result Value Ref Range   WBC 5.0 4.0 - 10.5 K/uL   RBC 3.24 (L) 3.87 - 5.11 MIL/uL   Hemoglobin 9.3 (L) 12.0 - 15.0 g/dL   HCT 28.0 (L) 36.0 - 46.0 %   MCV 86.4 80.0 - 100.0 fL   MCH 28.7 26.0 - 34.0 pg   MCHC 33.2 30.0 - 36.0 g/dL   RDW 15.2 11.5 - 15.5 %   Platelets 92 (L) 150 - 400 K/uL   nRBC 0.0 0.0 - 0.2 %  Basic metabolic panel     Status: Abnormal   Collection Time: 08/31/21  7:19 AM  Result Value Ref Range   Sodium 131 (L) 135 - 145 mmol/L   Potassium 3.4 (L) 3.5 - 5.1 mmol/L   Chloride 95 (L) 98 - 111 mmol/L   CO2 24 22 - 32 mmol/L   Glucose, Bld 109 (H) 70 - 99 mg/dL   BUN 20 6 - 20 mg/dL   Creatinine, Ser 4.87 (H) 0.44 - 1.00 mg/dL   Calcium 6.7  (L) 8.9 - 10.3 mg/dL   GFR, Estimated 10 (L) >60 mL/min   Anion gap 12 5 - 15    Assessment: 61 year old female with end-stage renal disease on dialysis fall  Injuries: 1.  Supracondylar distal femur fracture status post ORIF 2.  Type II open tibial shaft fractures status post I&D and intramedullary nailing 3.  Open reduction internal fixation of left distal radius and percutaneous fixation of left distal ulna  Weightbearing: Weightbearing as tolerated bilateral lower extremities: Nonweightbearing left upper extremity through the wrist, weightbearing as tolerated through the elbow  Insicional and dressing care: Dressings to be changed postoperative day 2 or 3  Orthopedic device(s): Boot to the right lower extremity splint to the left upper extremity  CV/Blood loss: Acute blood loss anemia continue to monitor  VTE prophylaxis: We will recommend a DOAC for DVT prophylaxis  ID: Postoperative Ancef  Medical co-morbidities: Dialysis per nephrology  Dispo: To be determined after PT and OT evaluation  Follow - up plan: 2 weeks for x-rays and suture removal  Charlies Constable,  MD Orthopaedic Surgery

## 2021-08-31 NOTE — Evaluation (Signed)
Occupational Therapy Evaluation Patient Details Name: Kendra Clay MRN: [id number] DOB: 1960-08-21 Today's Date: 08/31/2021   History of Present Illness Kendra Clay is a 61 y.o. female with past medical history of ESRD on HD MWF, essential hypertension, coronary artery disease status post stent placement in 2017, dilated cardiomyopathy, chronic combined diastolic and systolic CHF, who initially presented to [facility] ED after a ground-level fall while walking up the stairs.  She incurred multiple fractures, to her left supracondylar distal femur fracture, right tibia shaft fracture, left distal radius and ulna fracture.  The patient underwent repair of those fractures on 08/29/21.   Clinical Impression   This 61 yo female admitted with above presents to acute OT with PLOF of being totally independent with all basic ADLs and IADLs. Currently she is setup/S-total A for basic ADLs and Max A +2 for all mobility. She will continue to benefit from acute OT with follow up on AIR.      Recommendations for follow up therapy are one component of a multi-disciplinary discharge planning process, led by the attending physician.  Recommendations may be updated based on patient status, additional functional criteria and insurance authorization.   Follow Up Recommendations  Acute inpatient rehab (3hours/day)    Assistance Recommended at Discharge Frequent or constant Supervision/Assistance  Patient can return home with the following Two people to help with bathing/dressing/bathroom;Two people to help with walking and/or transfers;Assistance with cooking/housework;Assist for transportation;Help with stairs or ramp for entrance    Functional Status Assessment  Patient has had a recent decline in their functional status and demonstrates the ability to make significant improvements in function in a reasonable and predictable amount of time.  Equipment Recommendations  Other (comment) (TBD next vneue)        Precautions / Restrictions Precautions Precautions: Fall Required Braces or Orthoses: Other Brace Other Brace: camboot on R Restrictions Weight Bearing Restrictions: Yes LUE Weight Bearing: Weight bear through elbow only RLE Weight Bearing: Weight bearing as tolerated LLE Weight Bearing: Weight bearing as tolerated      Mobility Bed Mobility Overal bed mobility: Needs Assistance Bed Mobility: Supine to Sit     Supine to sit: Max assist, HOB elevated, +2 for physical assistance     General bed mobility comments: slowly moving each leg with A, scooting hips around with A, A with up with trunk, A for scooting hips forward to EOB    Transfers Overall transfer level: Needs assistance Equipment used: Left platform walker Transfers: Sit to/from Stand, Bed to chair/wheelchair/BSC Sit to Stand: Max assist, +2 physical assistance, From elevated surface     Step pivot transfers: +2 physical assistance, Max assist     General transfer comment: Pt could not achieve full upright positioning in [place] so when she went to take a step she had difficulty unweighting leg to advance foot (more so with RLE with Cam boot than LLE); A to shift weight and advance LEs. VCs and A to put LEs forward a little before sitting so they would not be so painful when she sat down due to bending at knees      Balance Overall balance assessment: Needs assistance Sitting-balance support: No upper extremity supported, Feet supported Sitting balance-Leahy Scale: Fair     Standing balance support: Bilateral upper extremity supported, Reliant on assistive device for balance Standing balance-Leahy Scale: Poor Standing balance comment: heavy reliance on PFRW and A from therapists  ADL either performed or assessed with clinical judgement   ADL Overall ADL's : Needs assistance/impaired Eating/Feeding: Modified independent;Sitting   Grooming: Set  up;Supervision/safety;Sitting   Upper Body Bathing: Minimal assistance;Sitting   Lower Body Bathing: Maximal assistance Lower Body Bathing Details (indicate cue type and reason): Max A +2 sit<>stand Upper Body Dressing : Maximal assistance;Sitting   Lower Body Dressing: Total assistance Lower Body Dressing Details (indicate cue type and reason): Max A +2 sit<>stand Toilet Transfer: Maximal assistance;+2 for physical assistance;Stand-pivot Toilet Transfer Details (indicate cue type and reason): Right PFRW Toileting- Clothing Manipulation and Hygiene: Total assistance Toileting - Clothing Manipulation Details (indicate cue type and reason): Can maintain standing with Mod A             Vision Baseline Vision/History: 1 Wears glasses Ability to See in Adequate Light: 0 Adequate Patient Visual Report: No change from baseline              Pertinent Vitals/Pain Pain Assessment Pain Assessment: Faces Faces Pain Scale: Hurts even more Pain Location: Bil LEs with movement and ambulation Pain Descriptors / Indicators: Aching, Grimacing, Guarding, Sore Pain Intervention(s): Limited activity within patient's tolerance, Monitored during session, Premedicated before session, Repositioned     Hand Dominance Right   Extremity/Trunk Assessment Upper Extremity Assessment Upper Extremity Assessment: LUE deficits/detail;RUE deficits/detail RUE Deficits / Details: generalized weakness LUE Deficits / Details: limited due to casting from hand to 2/3 of forearm LUE Coordination: decreased fine motor;decreased gross motor           Communication Communication Communication: No difficulties   Cognition Arousal/Alertness: Awake/alert Behavior During Therapy: WFL for tasks assessed/performed Overall Cognitive Status: Within Functional Limits for tasks assessed                                                  Home Living Family/patient expects to be discharged to::  Private residence Living Arrangements: Children Available Help at Discharge: Family;Available 24 hours/day Type of Home: House Home Access: Stairs to enter [employer] of Steps: 2 Entrance Stairs-Rails: None Home Layout: One level     Bathroom Shower/Tub: Teacher, early years/pre: Standard     Home Equipment: Cane - single Barista (2 wheels);Rollator (4 wheels);Shower seat - built in          Prior Functioning/Environment Prior Level of Function : Independent/Modified Independent             Mobility Comments: uses cane          OT Problem List: Decreased strength;Decreased range of motion;Impaired balance (sitting and/or standing);Pain      OT Treatment/Interventions: Self-care/ADL training;DME and/or AE instruction;Patient/family education;Balance training    OT Goals(Current goals can be found in the care plan section) Acute Rehab OT Goals Patient Stated Goal: for moving to not be so painful OT Goal Formulation: With patient Time For Goal Achievement: 09/13/21 Potential to Achieve Goals: Good  OT Frequency: Min 2X/week    Co-evaluation PT/OT/SLP Co-Evaluation/Treatment: Yes Reason for Co-Treatment: Complexity of the patient's impairments (multi-system involvement);For patient/therapist safety PT goals addressed during session: Mobility/safety with mobility;Balance;Proper use of DME;Strengthening/ROM OT goals addressed during session: Strengthening/ROM;ADL's and self-care      AM-PAC OT "6 Clicks" Daily Activity     Outcome Measure Help from another person eating meals?: A Little Help from another person taking care of  personal grooming?: A Little Help from another person toileting, which includes using toliet, bedpan, or urinal?: Total Help from another person bathing (including washing, rinsing, drying)?: A Lot Help from another person to put on and taking off regular upper body clothing?: A Lot Help from another person  to put on and taking off regular lower body clothing?: Total 6 Click Score: 12   End of Session Equipment Utilized During Treatment: Gait belt (right PFRW) Nurse Communication: Mobility status (to chat text when ready to go back to bed)  Activity Tolerance: Patient tolerated treatment well Patient left: in chair;with call bell/phone within reach;with chair alarm set  OT Visit Diagnosis: Unsteadiness on feet (R26.81);Other abnormalities of gait and mobility (R26.89);Muscle weakness (generalized) (M62.81);Pain Pain - part of body:  (both legs)                Time: 1100-1127 OT Time Calculation (min): 27 min Charges:  OT General Charges $OT Visit: 1 Visit OT Evaluation $OT Eval Moderate Complexity: [place], OTR/L Acute [employer] [facility] [phone] Office [phone]    Almon Register 08/31/2021, 12:15 PM

## 2021-08-31 NOTE — Progress Notes (Signed)
[facility] Progress Note   Subjective:    Seen and examined patient at bedside. Just finished working with PT and sitting up in recliner. Reports generalized pain which is expected. Denies SOB and CP. Tolerated yesterday's HD with net UF 1.1L. Ortho now at bedside to perform dressing change.  Objective Vitals:   08/30/21 2115 08/30/21 2133 08/31/21 0500 08/31/21 0919  BP: (!) 85/57 (!) 101/54 (!) 115/58 124/60  Pulse: 73 76 82 89  Resp: 12 14  16   Temp:  98.7 F (37.1 C) 98.3 F (36.8 C) 98.3 F (36.8 C)  TempSrc:   Oral Oral  SpO2: 98% 94%  100%  Weight:  64.7 kg 69.8 kg   Height:       Physical Exam General: Older woman; NAD Heart: S1 and S2; No murmurs, gallops, or rubs Lungs: Clear bilaterally Abdomen: Soft and non-tender Extremities: R boot in place-no edema R thigh; No edema LLE Dialysis Access: R AVF (+) B/T   Filed Weights   08/30/21 1736 08/30/21 2133 08/31/21 0500  Weight: 67.8 kg 64.7 kg 69.8 kg    Intake/Output Summary (Last 24 hours) at 08/31/2021 1148 Last data filed at 08/31/2021 1006 Gross per 24 hour  Intake 600 ml  Output 1100 ml  Net -500 ml    Additional Objective Labs: Basic Metabolic Panel: Recent Labs  Lab 08/29/21 1213 08/30/21 0344 08/31/21 0719  NA 132* 130* 131*  K 3.3* 4.7 3.4*  CL 95* 91* 95*  CO2 25 24 24   GLUCOSE 119* 133* 109*  BUN 23* 31* 20  CREATININE 6.59* 7.48* 4.87*  CALCIUM 7.1* 7.5* 6.7*  PHOS  --  5.0*  --    Liver Function Tests: Recent Labs  Lab 08/29/21 1213 08/30/21 0344  AST 25 23  ALT 16 11  ALKPHOS 152* 153*  BILITOT 0.7 0.6  PROT 5.6* 6.3*  ALBUMIN 2.6* 2.7*   No results for input(s): "LIPASE", "AMYLASE" in the last 168 hours. CBC: Recent Labs  Lab 08/29/21 1213 08/30/21 0344 08/31/21 0719  WBC 2.6* 4.5 5.0  NEUTROABS 1.9 3.7  --   HGB 7.6* 11.2* 9.3*  HCT 24.1* 33.3* 28.0*  MCV 90.9 85.6 86.4  PLT 82* 96* 92*   Blood Culture No results found for: "SDES",  "SPECREQUEST", "CULT", "REPTSTATUS"  Cardiac Enzymes: No results for input(s): "CKTOTAL", "CKMB", "CKMBINDEX", "TROPONINI" in the last 168 hours. CBG: No results for input(s): "GLUCAP" in the last 168 hours. Iron Studies:  Recent Labs    08/30/21 1036  IRON 44  TIBC 160*  FERRITIN 3,954*   Lab Results  Component Value Date   INR 1.2 08/29/2021   INR 1.21 08/14/2015   Studies/Results: DG CHEST PORT 1 VIEW  Result Date: 08/30/2021 CLINICAL DATA:  COVID-19 EXAM: PORTABLE CHEST 1 VIEW COMPARISON:  Radiographs 08/29/2021 FINDINGS: Chronic emphysematous changes. Bandlike opacities in the right lower lung may represent atelectasis or scarring. Otherwise no focal consolidation, pleural effusion, or pneumothorax. Cardiomediastinal silhouette at the upper limits of normal. Vascular stent right axilla. No acute osseous abnormality. IMPRESSION: Chronic emphysematous changes.  No acute cardiopulmonary process. Electronically Signed   By: Placido Sou M.D.   On: 08/30/2021 13:14   DG Wrist 2 Views Left  Result Date: 08/29/2021 CLINICAL DATA:  Postop EXAM: LEFT WRIST - 2 VIEW COMPARISON:  08/29/2021 FINDINGS: Interval surgical plate and screw fixation of distal radius fracture with decreased displacement and overriding. External pin fixation of the distal ulna fracture with residual close to 1/2  shaft diameter ulnar and dorsal displacement of distal fracture fragment. IMPRESSION: Interval surgical fixation of distal radius and ulna fracture Electronically Signed   By: Donavan Foil M.D.   On: 08/29/2021 19:10   DG Knee Left Port  Result Date: 08/29/2021 CLINICAL DATA:  Postop EXAM: PORTABLE LEFT KNEE - 1-2 VIEW COMPARISON:  08/29/2021 FINDINGS: Previous intramedullary rodding of the tibia. Interval surgical plate and multiple screw fixation of comminuted distal femoral fracture with decreased impaction and displacement. Gas in the soft tissues consistent with recent surgery. IMPRESSION: Interval  surgical fixation of comminuted distal femoral fracture Electronically Signed   By: Donavan Foil M.D.   On: 08/29/2021 19:08   DG Tibia/Fibula Right Port  Result Date: 08/29/2021 CLINICAL DATA:  Postop EXAM: PORTABLE RIGHT TIBIA AND FIBULA - 2 VIEW COMPARISON:  08/29/2021 FINDINGS: Interval intramedullary rodding and screw fixation of comminuted midshaft tibial fracture with overall decreased displacement and angulation. Comminuted fracture involving the mid to distal shaft of the fibula with residual 1/2 shaft diameter lateral and anterior displacement of distal fracture fragment, overall improved. IMPRESSION: 1. Interval surgical fixation of comminuted tibial shaft fracture with decreased angulation and displacement 2. Comminuted fibular shaft fracture with overall decreased displacement and angulation Electronically Signed   By: Donavan Foil M.D.   On: 08/29/2021 19:07   DG Tibia/Fibula Right  Result Date: 08/29/2021 CLINICAL DATA:  Tibial fracture EXAM: RIGHT TIBIA AND FIBULA - 2 VIEW COMPARISON:  Same day tibia/fibula radiographs FINDINGS: Five C-arm fluoroscopic images were obtained intraoperatively and submitted for post operative interpretation. Initial image demonstrates the displaced and comminuted tibial and fibular diaphyseal fractures. Subsequent images demonstrate intramedullary rod and screw fixation of the tibia with improved fracture alignment. The fibular fracture alignment is also improved on the final images. Total dose 3.5 mGy total fluoro time 176.7 seconds. Please see the performing provider's procedural report for further detail. IMPRESSION: Intraoperative images during surgical fixation of the tibial diaphyseal fracture as above. Electronically Signed   By: Valetta Mole M.D.   On: 08/29/2021 18:50   DG Knee Complete 4 Views Left  Result Date: 08/29/2021 CLINICAL DATA:  Femur fracture. EXAM: LEFT KNEE - COMPLETE 4+ VIEW COMPARISON:  Same-day knee radiographs FINDINGS: Six C-arm  fluoroscopic images were obtained intraoperatively and submitted for post operative interpretation. The initial image demonstrates similar alignment of the comminuted distal femoral fracture. Subsequent images demonstrate sideplate and screw fixation of the fracture with improved alignment. Hardware alignment is within expected limits, without evidence of complication. Total fluoro time 176.7 seconds, dose 3.5 mGy. Please see the performing provider's procedural report for further detail. IMPRESSION: Intraoperative images during surgical fixation of the distal femoral fracture as above. Electronically Signed   By: Valetta Mole M.D.   On: 08/29/2021 18:48   DG Wrist Complete Left  Result Date: 08/29/2021 CLINICAL DATA:  Wrist fracture. EXAM: LEFT WRIST - COMPLETE 3+ VIEW COMPARISON:  Same-day forearm radiographs FINDINGS: Six C-arm fluoroscopic images were obtained intraoperatively and submitted for post operative interpretation. The initial image demonstrates similar alignment of the distal radial and ulnar fractures. Subsequent images demonstrate sideplate and screw fixation of the distal radial fracture with improved alignment. The final images demonstrate pin fixation across the ulnar fracture with improved alignment. Fluoro time 176.7 seconds. Dose 3.5 mGy. Please see the performing provider's procedural report for further detail. IMPRESSION: Intraoperative images during surgical fixation of the distal radial and ulnar fractures as above. Electronically Signed   By: Court Joy.D.  On: 08/29/2021 18:47   DG C-Arm 1-60 Min-No Report  Result Date: 08/29/2021 Fluoroscopy was utilized by the requesting physician.  No radiographic interpretation.   DG C-Arm 1-60 Min-No Report  Result Date: 08/29/2021 Fluoroscopy was utilized by the requesting physician.  No radiographic interpretation.   DG C-Arm 1-60 Min-No Report  Result Date: 08/29/2021 Fluoroscopy was utilized by the requesting physician.  No  radiographic interpretation.   DG Knee Left Port  Result Date: 08/29/2021 CLINICAL DATA:  Fall, pain EXAM: PORTABLE LEFT KNEE - 1-2 VIEW COMPARISON:  None Available. FINDINGS: Generalized osteopenia. Comminuted fracture of the distal femoral metaphysis with apex anterior angulation and posterior angulation. No aggressive osseous lesion. Normal alignment. Intramedullary nail in the proximal tibia. Soft tissue are unremarkable. No radiopaque foreign body or soft tissue emphysema. IMPRESSION: 1. Generalized osteopenia. Comminuted fracture of the distal femoral metaphysis with apex anterior angulation and posterior angulation. Electronically Signed   By: Kathreen Devoid M.D.   On: 08/29/2021 13:18   DG Chest Port 1 View  Result Date: 08/29/2021 CLINICAL DATA:  [place]. EXAM: PORTABLE CHEST 1 VIEW COMPARISON:  04/24/2020 FINDINGS: The cardiac silhouette, mediastinal and hilar contours are within normal limits given the AP projection and portable technique. Chronic emphysematous changes and pulmonary scarring but no acute overlying pulmonary process. No pleural effusions or pneumothorax. The bony thorax is grossly intact. No obvious acute rib fractures. Stable thoracic spine scoliosis. IMPRESSION: Chronic lung changes but no acute pulmonary findings. Electronically Signed   By: Marijo Sanes M.D.   On: 08/29/2021 13:13   DG Forearm Left  Result Date: 08/29/2021 CLINICAL DATA:  Fall, pain EXAM: LEFT FOREARM - 2 VIEW COMPARISON:  None Available. FINDINGS: There are transverse fractures through the distal radial and ulnar metaphyses. There is dorsal displacement of the distal fragments with approximately 1.2 cm dorsal displacement and 1.2 cm overlap of the distal radial fragment. There is mild radial displacement and angulation of the distal fragments. There is overlying soft tissue swelling. IMPRESSION: Displaced transverse fractures through the distal radial and ulnar metaphyses as above. Electronically Signed   By:  Valetta Mole M.D.   On: 08/29/2021 13:10   DG Tibia/Fibula Right Port  Result Date: 08/29/2021 CLINICAL DATA:  Fall from standing position.  Pain. EXAM: PORTABLE RIGHT TIBIA AND FIBULA - 2 VIEW COMPARISON:  None Available. FINDINGS: Comminuted transverse tibia and fibular fractures are noted in the midshaft. Lateral and posterior displacement is noted. The knee and ankle joints are located. IMPRESSION: 1. Comminuted transverse midshaft tibia and fibular fractures as described. 2. Lateral and posterior displacement. Electronically Signed   By: San Morelle M.D.   On: 08/29/2021 13:08    Medications:  sodium chloride     sodium chloride 10 mL/hr at 08/29/21 2200   methocarbamol (ROBAXIN) IV      apixaban  2.5 mg Oral BID   atorvastatin  40 mg Oral QHS   Chlorhexidine Gluconate Cloth  6 each Topical Q0600   cinacalcet  30 mg Oral Q supper   docusate sodium  100 mg Oral BID   ferric citrate  210 mg Oral TID WC   molnupiravir EUA  4 capsule Oral BID   pantoprazole  40 mg Oral BID    Dialysis Orders: [place] MWF  3h 58min  350/1.5   60.5kg  2/2 bath  P2  Hep 1200  RFA AVF - last HD 7/26 , post wt 60.4kg  - last Hb 9.5 on 7/26 - doxercalciferol 8 ug tiw  IV - mircera 75 ug q 4wks, last 7/24   Assessment/Plan: SP fall/ sp repair of multiple extremity fractures - done 7/27, per pmd / ortho ESRD - on HD MWF. Next HD 7/31. HTN - home meds on hold, will resume when needed. BP soft this AM-appears to be stabilizing. Vol - no sig vol excess on exam Anemia abl - sp 2u prbcs yest, Hb 7.6 > 11 yesterday but now down to 9.3 today. Last ESA given on 7/24. Transfuse for Hgb <7. 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 Will hold Sensipar for now. Cont auryxia as binder and IV vdra w/ HD tiw.    Kendra Poet, NP [facility] 08/31/2021,11:48 AM  LOS: 2 days

## 2021-08-31 NOTE — Progress Notes (Signed)
Physical Therapy Treatment Patient Details Name: Kendra Clay MRN: [id number] DOB: Nov 04, 1960 Today's Date: 08/31/2021   History of Present Illness Kendra Clay is a 61 y.o. female with past medical history of ESRD on HD MWF, essential hypertension, coronary artery disease status post stent placement in 2017, dilated cardiomyopathy, chronic combined diastolic and systolic CHF, who initially presented to [facility] ED after a ground-level fall while walking up the stairs.  She incurred multiple fractures, to her left supracondylar distal femur fracture, right tibia shaft fracture, left distal radius and ulna fracture.  The patient underwent repair of those fractures on 08/29/21.    PT Comments    Pt agreeable to participate and progressing towards her physical therapy goals. Due to complexity of injuries, pain remains a limiting factor despite premedication. Pt able to stand and take pivotal steps to chair with two person assist. Emphasis on upright posture, weight shift, and manual assist for taking steps. Suspect continued progress based on age, PLOF, and motivation. Recommend AIR to address deficits and maximize functional mobility.    Recommendations for follow up therapy are one component of a multi-disciplinary discharge planning process, led by the attending physician.  Recommendations may be updated based on patient status, additional functional criteria and insurance authorization.  Follow Up Recommendations  Acute inpatient rehab (3hours/day)     Assistance Recommended at Discharge Intermittent Supervision/Assistance  Patient can return home with the following A lot of help with walking and/or transfers;A lot of help with bathing/dressing/bathroom;Assist for transportation;Help with stairs or ramp for entrance;Direct supervision/assist for medications management;Assistance with cooking/housework   Equipment Recommendations  Wheelchair cushion (measurements PT);Wheelchair (measurements  PT)    Recommendations for Other Services       Precautions / Restrictions Precautions Precautions: Fall Required Braces or Orthoses: Other Brace Other Brace: camboot on R Restrictions Weight Bearing Restrictions: Yes LUE Weight Bearing: Weight bear through elbow only RLE Weight Bearing: Weight bearing as tolerated LLE Weight Bearing: Weight bearing as tolerated     Mobility  Bed Mobility Overal bed mobility: Needs Assistance Bed Mobility: Supine to Sit     Supine to sit: Max assist, +2 for physical assistance     General bed mobility comments: slowly moving each leg with assist, scooting hips around with bed pad, elevating trunk, for scooting hips forward to EOB    Transfers Overall transfer level: Needs assistance Equipment used: Left platform walker Transfers: Sit to/from Stand, Bed to chair/wheelchair/BSC Sit to Stand: Max assist, +2 physical assistance, From elevated surface   Step pivot transfers: +2 physical assistance, Max assist       General transfer comment: Pt could not achieve full upright positioning in PFRW so when she went to take a step she had difficulty unweighting leg to advance foot (more so with RLE with Cam boot than LLE); assist to shift weight and advance LEs. VCs and A to put LEs forward a little before sitting so they would not be so painful when she sat down due to bending at knees    Ambulation/Gait                   Stairs             Wheelchair Mobility    Modified Rankin (Stroke Patients Only)       Balance Overall balance assessment: Needs assistance Sitting-balance support: No upper extremity supported, Feet supported Sitting balance-Leahy Scale: Fair     Standing balance support: Bilateral upper extremity supported, Reliant on  assistive device for balance Standing balance-Leahy Scale: Poor Standing balance comment: heavy reliance on PFRW and A from therapists                             Cognition Arousal/Alertness: Awake/alert Behavior During Therapy: WFL for tasks assessed/performed Overall Cognitive Status: Within Functional Limits for tasks assessed                                          Exercises General Exercises - Lower Extremity Long Arc Quad: AAROM, Both, 5 reps, Seated    General Comments        Pertinent Vitals/Pain Pain Assessment Pain Assessment: Faces Faces Pain Scale: Hurts worst Pain Location: BLE's with knee flexion, L wrist Pain Descriptors / Indicators: Aching, Grimacing, Guarding, Sore Pain Intervention(s): Monitored during session, Limited activity within patient's tolerance, Premedicated before session    Home Living Family/patient expects to be discharged to:: Private residence Living Arrangements: Children Available Help at Discharge: Family;Available 24 hours/day Type of Home: House Home Access: Stairs to enter Entrance Stairs-Rails: None Entrance Stairs-Number of Steps: 2   Home Layout: One level Home Equipment: Cane - single Barista (2 wheels);Rollator (4 wheels);Shower seat - built in      Prior Function            PT Goals (current goals can now be found in the care plan section) Acute Rehab PT Goals Patient Stated Goal: to return to independent Potential to Achieve Goals: Good Progress towards PT goals: Progressing toward goals    Frequency    Min 5X/week      PT Plan Current plan remains appropriate    Co-evaluation PT/OT/SLP Co-Evaluation/Treatment: Yes Reason for Co-Treatment: Complexity of the patient's impairments (multi-system involvement);For patient/therapist safety;To address functional/ADL transfers PT goals addressed during session: Mobility/safety with mobility OT goals addressed during session: Strengthening/ROM;ADL's and self-care      AM-PAC PT "6 Clicks" Mobility   Outcome Measure  Help needed turning from your back to your side while in a flat bed without  using bedrails?: A Lot Help needed moving from lying on your back to sitting on the side of a flat bed without using bedrails?: Total Help needed moving to and from a bed to a chair (including a wheelchair)?: Total Help needed standing up from a chair using your arms (e.g., wheelchair or bedside chair)?: Total Help needed to walk in hospital room?: Total Help needed climbing 3-5 steps with a railing? : Total 6 Click Score: 7    End of Session Equipment Utilized During Treatment: Gait belt;Other (comment) (R CAM boot) Activity Tolerance: Patient limited by pain Patient left: in chair;with call bell/phone within reach;with chair alarm set Nurse Communication: Mobility status;Need for lift equipment PT Visit Diagnosis: Other abnormalities of gait and mobility (R26.89);Difficulty in walking, not elsewhere classified (R26.2);Pain Pain - Right/Left: Left Pain - part of body: Hip     Time: 1100-1126 PT Time Calculation (min) (ACUTE ONLY): 26 min  Charges:  $Therapeutic Activity: 8-22 mins                     Wyona Almas, PT, DPT Acute Rehabilitation Services Office [phone]    Deno Etienne 08/31/2021, 12:58 PM

## 2021-09-01 DIAGNOSIS — S72402A Unspecified fracture of lower end of left femur, initial encounter for closed fracture: Secondary | ICD-10-CM | POA: Diagnosis not present

## 2021-09-01 DIAGNOSIS — S52502A Unspecified fracture of the lower end of left radius, initial encounter for closed fracture: Secondary | ICD-10-CM | POA: Diagnosis not present

## 2021-09-01 DIAGNOSIS — I5022 Chronic systolic (congestive) heart failure: Secondary | ICD-10-CM | POA: Diagnosis not present

## 2021-09-01 DIAGNOSIS — D61818 Other pancytopenia: Secondary | ICD-10-CM

## 2021-09-01 DIAGNOSIS — S82201B Unspecified fracture of shaft of right tibia, initial encounter for open fracture type I or II: Secondary | ICD-10-CM | POA: Diagnosis not present

## 2021-09-01 LAB — BASIC METABOLIC PANEL
Anion gap: 11 (ref 5–15)
BUN: 34 mg/dL — ABNORMAL HIGH (ref 6–20)
CO2: 27 mmol/L (ref 22–32)
Calcium: 7.3 mg/dL — ABNORMAL LOW (ref 8.9–10.3)
Chloride: 91 mmol/L — ABNORMAL LOW (ref 98–111)
Creatinine, Ser: 7.11 mg/dL — ABNORMAL HIGH (ref 0.44–1.00)
GFR, Estimated: 6 mL/min — ABNORMAL LOW (ref >60)
Glucose, Bld: 95 mg/dL (ref 70–99)
Potassium: 3.6 mmol/L (ref 3.5–5.1)
Sodium: 129 mmol/L — ABNORMAL LOW (ref 135–145)

## 2021-09-01 LAB — LACTIC ACID, PLASMA: Lactic Acid, Venous: 1.6 mmol/L (ref 0.5–1.9)

## 2021-09-01 MED ORDER — LIDOCAINE-PRILOCAINE 2.5-2.5 % EX CREA
1.0000 | TOPICAL_CREAM | CUTANEOUS | Status: DC | PRN
  Filled 2021-09-01: qty 5

## 2021-09-01 MED ORDER — MIDODRINE HCL 5 MG PO TABS
10.0000 mg | ORAL_TABLET | Freq: Three times a day (TID) | ORAL | Status: DC
  Administered 2021-09-01 – 2021-09-02 (×4): 10 mg via ORAL
  Filled 2021-09-01 (×6): qty 2

## 2021-09-01 MED ORDER — DOXERCALCIFEROL 4 MCG/2ML IV SOLN
4.0000 ug | INTRAVENOUS | Status: DC
  Administered 2021-09-02 – 2021-09-04 (×2): 4 ug via INTRAVENOUS
  Filled 2021-09-01 (×4): qty 2

## 2021-09-01 MED ORDER — ALTEPLASE 2 MG IJ SOLR
2.0000 mg | Freq: Once | INTRAMUSCULAR | Status: DC | PRN
  Filled 2021-09-01: qty 2

## 2021-09-01 MED ORDER — PENTAFLUOROPROP-TETRAFLUOROETH EX AERO: 1.0000 | INHALATION_SPRAY | CUTANEOUS | Status: DC | PRN

## 2021-09-01 MED ORDER — LIDOCAINE HCL (PF) 1 % IJ SOLN
5.0000 mL | INTRAMUSCULAR | Status: DC | PRN
  Filled 2021-09-01: qty 5

## 2021-09-01 MED ORDER — HEPARIN SODIUM (PORCINE) 1000 UNIT/ML DIALYSIS
1000.0000 [IU] | INTRAMUSCULAR | Status: DC | PRN
  Filled 2021-09-01: qty 1

## 2021-09-01 NOTE — Progress Notes (Signed)
[facility] Progress Note   Subjective:    Seen and examined patient at bedside. Informed of hypotension overnight so Midodrine was started. Ant-hypertensives now on hold. Plan for HD 7/31.  Objective Vitals:   09/01/21 0510 09/01/21 0600 09/01/21 0700 09/01/21 0710  BP: (!) 86/51 (!) 86/64 (!) 94/54   Pulse:  96 100   Resp:  17 13   Temp:  98.1 F (36.7 C)  98.4 F (36.9 C)  TempSrc:  Oral    SpO2:  97% 98%   Weight:      Height:       Physical Exam General: Older woman; NAD Heart: S1 and S2; No murmurs, gallops, or rubs Lungs: Clear bilaterally Abdomen: Soft and non-tender Extremities: R boot in place-no edema R thigh; No edema LLE Dialysis Access: R AVF (+) B/T  Filed Weights   08/30/21 1736 08/30/21 2133 08/31/21 0500  Weight: 67.8 kg 64.7 kg 69.8 kg    Intake/Output Summary (Last 24 hours) at 09/01/2021 1053 Last data filed at 09/01/2021 0320 Gross per 24 hour  Intake 250 ml  Output --  Net 250 ml    Additional Objective Labs: Basic Metabolic Panel: Recent Labs  Lab 08/30/21 0344 08/31/21 0719 09/01/21 0736  NA 130* 131* 129*  K 4.7 3.4* 3.6  CL 91* 95* 91*  CO2 24 24 27   GLUCOSE 133* 109* 95  BUN 31* 20 34*  CREATININE 7.48* 4.87* 7.11*  CALCIUM 7.5* 6.7* 7.3*  PHOS 5.0*  --   --    Liver Function Tests: Recent Labs  Lab 08/29/21 1213 08/30/21 0344  AST 25 23  ALT 16 11  ALKPHOS 152* 153*  BILITOT 0.7 0.6  PROT 5.6* 6.3*  ALBUMIN 2.6* 2.7*   No results for input(s): "LIPASE", "AMYLASE" in the last 168 hours. CBC: Recent Labs  Lab 08/29/21 1213 08/30/21 0344 08/31/21 0719 08/31/21 2157  WBC 2.6* 4.5 5.0 6.3  NEUTROABS 1.9 3.7  --   --   HGB 7.6* 11.2* 9.3* 9.7*  HCT 24.1* 33.3* 28.0* 29.2*  MCV 90.9 85.6 86.4 87.7  PLT 82* 96* 92* 100*   Blood Culture No results found for: "SDES", "SPECREQUEST", "CULT", "REPTSTATUS"  Cardiac Enzymes: No results for input(s): "CKTOTAL", "CKMB", "CKMBINDEX", "TROPONINI" in the  last 168 hours. CBG: No results for input(s): "GLUCAP" in the last 168 hours. Iron Studies:  Recent Labs    08/30/21 1036  IRON 44  TIBC 160*  FERRITIN 3,954*   Lab Results  Component Value Date   INR 1.2 08/29/2021   INR 1.21 08/14/2015   Studies/Results: DG CHEST PORT 1 VIEW  Result Date: 08/30/2021 CLINICAL DATA:  COVID-19 EXAM: PORTABLE CHEST 1 VIEW COMPARISON:  Radiographs 08/29/2021 FINDINGS: Chronic emphysematous changes. Bandlike opacities in the right lower lung may represent atelectasis or scarring. Otherwise no focal consolidation, pleural effusion, or pneumothorax. Cardiomediastinal silhouette at the upper limits of normal. Vascular stent right axilla. No acute osseous abnormality. IMPRESSION: Chronic emphysematous changes.  No acute cardiopulmonary process. Electronically Signed   By: Placido Sou M.D.   On: 08/30/2021 13:14    Medications:  sodium chloride     sodium chloride 10 mL/hr at 08/29/21 2200   methocarbamol (ROBAXIN) IV      apixaban  2.5 mg Oral BID   atorvastatin  40 mg Oral QHS   Chlorhexidine Gluconate Cloth  6 each Topical Q0600   docusate sodium  100 mg Oral BID   ferric citrate  210 mg Oral  TID WC   midodrine  10 mg Oral TID WC   molnupiravir EUA  4 capsule Oral BID   pantoprazole  40 mg Oral BID   predniSONE  40 mg Oral QAC breakfast   Vitamin D (Ergocalciferol)  50,000 Units Oral Q7 days    Dialysis Orders: [facility] MWF  3h 43min  350/1.5   60.5kg  2/2 bath  P2  Hep 1200  RFA AVF - last HD 7/26 , post wt 60.4kg  - last Hb 9.5 on 7/26 - doxercalciferol 8 ug tiw IV - mircera 75 ug q 4wks, last 7/24  Assessment/Plan: SP fall/ sp repair of multiple extremity fractures - done 7/27, per pmd / ortho ESRD - on HD MWF. Next HD 7/31. HTN - BP dropped overnight. Home meds stopped and now on Midodrine. Vol - no sig vol excess on exam Anemia abl - sp 2u prbcs yest, Hb 7.6 > 11 yesterday but now down to 9.3 today. Last ESA given on 7/24.  Transfuse for Hgb <7. 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 Ca trending up slowly after Sensipar was held yesterday. Monitor trend. Cont auryxia as binder and IV vdra w/ HD tiw. Weekly PO Vit D stopped since we will be giving Hectorol with HD.  Tobie Poet, NP [facility] 09/01/2021,10:53 AM  LOS: 3 days

## 2021-09-01 NOTE — Progress Notes (Signed)
Kendra Clay, is a 61 y.o. female, DOB - [birth year], WUJ:[id number] Admit date - 08/29/2021    Outpatient Primary MD for the patient is Patient, [name]  LOS - 3  days  Chief Complaint  Patient presents with   Fall       Brief summary   Patient is a 61 year old female with ESRD on HD MWF, HTN, CAD status post stent in 2017, chronic combined diastolic and systolic CHF presented after a ground-level fall while walking up the stairs.  Patient incurred multiple fractures, underwent repair by orthopedic surgery on 08/29/2021. Patient denied any syncopal episode, no chest pain or acute shortness of breath. At baseline lives at home with her son and ambulates with a cane.  Patient did report diarrheal illness in the last few days and had felt weak.  Assessment & Plan    Principal Problem: Left supracondylar distal femur fracture Right open tibial shaft fracture Left distal radius and ulna fracture -Status post OR on 7/27, underwent ORIF of left distal femur fracture, IM nailing of right tibial shaft fracture, ORIF left distal radius fracture, perk fixation of left distal ulna fracture irrigation and debridement of right open tibial fracture. -Management per orthopedics -Pain currently controlled, recommended DOACs for DVT prophylaxis, started on apixaban  Active problems  ESRD on hemodialysis MWF, history of polycystic kidney disease -Nephrology following, continue HD per renal  COVID-19  -CRP 1.0, D-dimer 2.82, no acute infiltrates, however overnight developed hypoxia, was placed on 3 L O2.  Has diarrhea -Started on molnupiravir on 7/28, on prednisone with quick taper -No worsening hypoxia.  If patient drops any hypoxia or chest pain, persistent hypotension, obtain CT angiogram of the chest to rule out PE  Vitamin D deficiency Vitamin D level 14.0 -Vitamin D oral  replacement stopped, nephrology will give Hectorol with HD.  Volume management with  Chronic HFrEF, CAD, stent in 2017 -No recent echo in epic, echo 2017 showed EF of 40-45%, grade 1 DD, follows cardiology, Dr Otho Perl in [place].  Per office note, patient had 2D echo in 2022 which had shown normal left ventricular systolic function. Volume management with HD overnight  Essential hypertension -Hypotensive, Coreg and Imdur were held 729.  Started on midodrine this morning Further management per nephrology  Acute on chronic anemia, anemia of chronic disease/ESRD - Presented with hemoglobin of 7.6, at baseline hemoglobin ~10 -Patient was transfused 2 units on 7/27, H&H stable 9.7  Pancytopenia with chronic thrombocytopenia -Counts improving, WBC 6.3, hemoglobin 9.7, platelets 100 K   Code Status: Full CODE STATUS DVT Prophylaxis:  apixaban (ELIQUIS) tablet 2.5 mg Start: 08/30/21 2200 SCDs Start: 08/29/21 2116 apixaban (ELIQUIS) tablet 2.5 mg   Level of Care: Level of care: Med-Surg Family Communication: Updated patient   Disposition Plan:      Remains inpatient appropriate: Awaiting CIR Procedures:  Procedures: 08/29/2021 Open reduction internal fixation of left distal femur Intramedullary nailing of right tibial shaft fracture Open reduction internal fixation of left distal  radius fracture Percutaneous fixation of left distal ulna fracture Irrigation and debridement of right open tibia fracture  Consultants:   Orthopedics  Antimicrobials:   Anti-infectives (From admission, onward)    Start     Dose/Rate Route Frequency Ordered Stop   08/30/21 2200  molnupiravir EUA (LAGEVRIO) capsule 800 mg        4 capsule Oral 2 times daily 08/30/21 1727 09/04/21 1830   08/29/21 2215  ceFAZolin (ANCEF) IVPB 2g/100 mL premix  Status:  Discontinued        2 g 200 mL/hr over 30 Minutes Intravenous Every 8 hours 08/29/21 2116 08/29/21 2140   08/29/21 2130  ceFAZolin (ANCEF) IVPB 1 g/50 mL  premix        1 g 100 mL/hr over 30 Minutes Intravenous  Once 08/29/21 2042 08/29/21 2231   08/29/21 1642  vancomycin (VANCOCIN) powder  Status:  Discontinued          As needed 08/29/21 1643 08/29/21 1806   08/29/21 1445  ceFAZolin (ANCEF) IVPB 2g/100 mL premix  Status:  Discontinued        2 g 200 mL/hr over 30 Minutes Intravenous On call to O.R. 08/29/21 1427 08/29/21 1445   08/29/21 1445  ceFAZolin (ANCEF) IVPB 1 g/50 mL premix  Status:  Discontinued        1 g 100 mL/hr over 30 Minutes Intravenous Every 8 hours 08/29/21 1442 08/29/21 2041   08/29/21 1444  ceFAZolin (ANCEF) 1-4 GM/50ML-% IVPB       Note to Pharmacy: Bobbie Stack M: cabinet override      08/29/21 1444 08/29/21 1556   08/29/21 1345  ceFAZolin (ANCEF) IVPB 2g/100 mL premix  Status:  Discontinued        2 g 200 mL/hr over 30 Minutes Intravenous  Once 08/29/21 1340 08/29/21 1445          Medications  apixaban  2.5 mg Oral BID   atorvastatin  40 mg Oral QHS   Chlorhexidine Gluconate Cloth  6 each Topical Q0600   docusate sodium  100 mg Oral BID   [START ON 09/02/2021] doxercalciferol  4 mcg Intravenous Q M,W,F-HD   ferric citrate  210 mg Oral TID WC   midodrine  10 mg Oral TID WC   molnupiravir EUA  4 capsule Oral BID   pantoprazole  40 mg Oral BID   predniSONE  40 mg Oral QAC breakfast      Subjective:   Kendra Clay was seen and examined today AM.  No acute complaints per patient currently, alert and oriented.  Overnight hypotensive with BP in 70s.  O2 sats 100% on 2 L   Objective:   Vitals:   09/01/21 0600 09/01/21 0700 09/01/21 0710 09/01/21 1233  BP: (!) 86/64 (!) 94/54  112/73  Pulse: 96 100  82  Resp: 17 13  13   Temp: 98.1 F (36.7 C)  98.4 F (36.9 C) 98.2 F (36.8 C)  TempSrc: Oral   Oral  SpO2: 97% 98%  100%  Weight:      Height:        Intake/Output Summary (Last 24 hours) at 09/01/2021 1309 Last data filed at 09/01/2021 0320 Gross per 24 hour  Intake 250 ml  Output --  Net 250  ml     Wt Readings from Last 3 Encounters:  08/31/21 69.8 kg  07/31/20 57.7 kg  06/28/20 55.5 kg   Physical Exam General: Alert and oriented x 3, NAD Cardiovascular: S1 S2 clear,  RRR Respiratory: CTAB, no wheezing Gastrointestinal: Soft, nontender, nondistended, NBS Ext: LUE in splint, LLE dressing intact, RLE immobilizer Neuro: no new deficits Skin: No rashes Psych: Normal affect and demeanor, alert and oriented x3   Data Reviewed:  I have personally reviewed following labs    CBC Lab Results  Component Value Date   WBC 6.3 08/31/2021   RBC 3.33 (L) 08/31/2021   HGB 9.7 (L) 08/31/2021   HCT 29.2 (L) 08/31/2021   MCV 87.7 08/31/2021   MCH 29.1 08/31/2021   PLT 100 (L) 08/31/2021   MCHC 33.2 08/31/2021   RDW 15.3 08/31/2021   LYMPHSABS 0.6 (L) 08/30/2021   MONOABS 0.2 08/30/2021   EOSABS 0.0 08/30/2021   BASOSABS 0.0 16/11/9602     Last metabolic panel Lab Results  Component Value Date   NA 129 (L) 09/01/2021   K 3.6 09/01/2021   CL 91 (L) 09/01/2021   CO2 27 09/01/2021   BUN 34 (H) 09/01/2021   CREATININE 7.11 (H) 09/01/2021   GLUCOSE 95 09/01/2021   GFRNONAA 6 (L) 09/01/2021   GFRAA 8 (L) 05/20/2018   CALCIUM 7.3 (L) 09/01/2021   PHOS 5.0 (H) 08/30/2021   PROT 6.3 (L) 08/30/2021   ALBUMIN 2.7 (L) 08/30/2021   BILITOT 0.6 08/30/2021   ALKPHOS 153 (H) 08/30/2021   AST 23 08/30/2021   ALT 11 08/30/2021   ANIONGAP 11 09/01/2021    CBG (last 3)  No results for input(s): "GLUCAP" in the last 72 hours.    Coagulation Profile: Recent Labs  Lab 08/29/21 1213  INR 1.2      Mittie Knittel M.D. [facility] 09/01/2021, 1:09 PM  Available via Epic secure chat 7am-7pm After 7 pm, please refer to night coverage provider listed on amion.

## 2021-09-02 ENCOUNTER — Encounter (HOSPITAL_COMMUNITY): Payer: Self-pay | Admitting: Student

## 2021-09-02 DIAGNOSIS — S52502A Unspecified fracture of the lower end of left radius, initial encounter for closed fracture: Secondary | ICD-10-CM | POA: Diagnosis not present

## 2021-09-02 DIAGNOSIS — S72402A Unspecified fracture of lower end of left femur, initial encounter for closed fracture: Secondary | ICD-10-CM | POA: Diagnosis not present

## 2021-09-02 DIAGNOSIS — I5022 Chronic systolic (congestive) heart failure: Secondary | ICD-10-CM | POA: Diagnosis not present

## 2021-09-02 DIAGNOSIS — S82201B Unspecified fracture of shaft of right tibia, initial encounter for open fracture type I or II: Secondary | ICD-10-CM | POA: Diagnosis not present

## 2021-09-02 LAB — BASIC METABOLIC PANEL
Anion gap: 13 (ref 5–15)
BUN: 48 mg/dL — ABNORMAL HIGH (ref 6–20)
CO2: 25 mmol/L (ref 22–32)
Calcium: 7.5 mg/dL — ABNORMAL LOW (ref 8.9–10.3)
Chloride: 88 mmol/L — ABNORMAL LOW (ref 98–111)
Creatinine, Ser: 8.3 mg/dL — ABNORMAL HIGH (ref 0.44–1.00)
GFR, Estimated: 5 mL/min — ABNORMAL LOW (ref >60)
Glucose, Bld: 130 mg/dL — ABNORMAL HIGH (ref 70–99)
Potassium: 4.6 mmol/L (ref 3.5–5.1)
Sodium: 126 mmol/L — ABNORMAL LOW (ref 135–145)

## 2021-09-02 LAB — PHOSPHORUS: Phosphorus: 4.8 mg/dL — ABNORMAL HIGH (ref 2.5–4.6)

## 2021-09-02 MED ORDER — PENTAFLUOROPROP-TETRAFLUOROETH EX AERO
INHALATION_SPRAY | CUTANEOUS | Status: AC
  Filled 2021-09-02: qty 30

## 2021-09-02 MED ORDER — MENTHOL 3 MG MT LOZG
1.0000 | LOZENGE | OROMUCOSAL | Status: DC | PRN
Start: 2021-09-02 — End: 2021-09-09
  Filled 2021-09-02 (×2): qty 9

## 2021-09-02 MED ORDER — PREDNISONE 20 MG PO TABS
20.0000 mg | ORAL_TABLET | Freq: Every day | ORAL | Status: AC
  Administered 2021-09-03: 20 mg via ORAL
  Filled 2021-09-02 (×3): qty 1

## 2021-09-02 MED ORDER — VITAMIN D (ERGOCALCIFEROL) 1.25 MG (50000 UNIT) PO CAPS
50000.0000 [IU] | ORAL_CAPSULE | ORAL | Status: DC
  Administered 2021-09-08: 50000 [IU] via ORAL
  Filled 2021-09-02: qty 1

## 2021-09-02 NOTE — Progress Notes (Signed)
Patient receiving HD at bedside. No complaints or needs at this time. HD nurse also present in patients room.

## 2021-09-02 NOTE — Progress Notes (Signed)
Received patient in bed to unit.  Alert and oriented.  Informed consent signed and in  chart.   Treatment initiated:    0808 Treatment completed:  1200 Patient tolerated well.   alert, without acute distress.  Hand-off given to patient's nurse.   Access used:    AVF Access issues: NO  Total UF removed: 2000 ml Medication(s) given: Hectoral 3mcg IVP Post HD VS: 98.3 94/59 80 14 100% RA Post HD weight: 68kg   [facility]

## 2021-09-02 NOTE — Progress Notes (Signed)
Kendra Clay, is a 61 y.o. female, DOB - 1960/08/13, YQM:[id number] Admit date - 08/29/2021    Outpatient Primary MD for the patient is Patient, [name]  LOS - 4  days  Chief Complaint  Patient presents with   Fall       Brief summary   Patient is a 61 year old female with ESRD on HD MWF, HTN, CAD status post stent in 2017, chronic combined diastolic and systolic CHF presented after a ground-level fall while walking up the stairs.  Patient incurred multiple fractures, underwent repair by orthopedic surgery on 08/29/2021. Patient denied any syncopal episode, no chest pain or acute shortness of breath. At baseline lives at home with her son and ambulates with a cane.  Patient did report diarrheal illness in the last few days and had felt weak.  Assessment & Plan    Principal Problem: Left supracondylar distal femur fracture Right open tibial shaft fracture Left distal radius and ulna fracture -Status post OR on 7/27, underwent ORIF of left distal femur fracture, IM nailing of right tibial shaft fracture, ORIF left distal radius fracture, perk fixation of left distal ulna fracture irrigation and debridement of right open tibial fracture. -Management per orthopedics -Pain currently controlled, Ortho recommended DOACs for DVT prophylaxis, started on apixaban -Continue PT OT, recommended CIR -Needs 10 days of isolation from positive COVID-19 test on 7/28 till 8/6  Active problems  ESRD on hemodialysis MWF, history of polycystic kidney disease Acute on chronic hyponatremia -Baseline sodium 131-132 sodium trending down, 129-> 126 today -Nephrology following, continue HD per renal  COVID-19  -CRP 1.0, D-dimer 2.82, no acute infiltrates, however overnight developed hypoxia, was placed on 3 L O2.  Has diarrhea -Started on molnupiravir on 7/28, on prednisone 40 mg daily x 3d,  taper to prednisone 20 mg daily tomorrow x 2 days -Improving, O2 sats 97% on 1 L.    Vitamin D deficiency Vitamin D level 14.0 -Vitamin D oral replacement stopped, nephrology will give Hectorol with HD.  Volume management with  Chronic HFrEF, CAD, stent in 2017 -No recent echo in epic, echo 2017 showed EF of 40-45%, grade 1 DD, follows cardiology, Dr Otho Perl in [place].  Per office note, patient had 2D echo in 2022 which had shown normal left ventricular systolic function. -Volume management with HD  Essential hypertension -BP has been soft, Coreg, Imdur held, placed on midodrine on 7/30   Acute on chronic anemia, anemia of chronic disease/ESRD - Presented with hemoglobin of 7.6, at baseline hemoglobin ~10 -Patient was transfused 2 units on 7/27, H&H stable 9.7  Pancytopenia with chronic thrombocytopenia -Counts improving, WBC 6.3, hemoglobin 9.7, platelets 100 K   Code Status: Full CODE STATUS DVT Prophylaxis:  apixaban (ELIQUIS) tablet 2.5 mg Start: 08/30/21 2200 SCDs Start: 08/29/21 2116 apixaban (ELIQUIS) tablet 2.5 mg   Level of Care: Level of care: Med-Surg Family Communication: Updated patient   Disposition Plan:      Remains inpatient appropriate: Awaiting CIR Procedures:  Procedures: 08/29/2021 Open reduction internal fixation of left distal femur Intramedullary nailing of right tibial shaft fracture Open reduction internal fixation of left distal radius fracture Percutaneous fixation of left distal ulna fracture Irrigation and debridement of right open tibia fracture  Consultants:   Orthopedics  Antimicrobials:   Anti-infectives (From admission, onward)    Start     Dose/Rate Route Frequency Ordered Stop   08/30/21 2200  molnupiravir EUA (LAGEVRIO) capsule 800 mg        4 capsule Oral 2 times daily 08/30/21 1727 09/04/21 1830   08/29/21 2215  ceFAZolin (ANCEF) IVPB 2g/100 mL premix  Status:  Discontinued        2 g 200 mL/hr over 30 Minutes Intravenous Every  8 hours 08/29/21 2116 08/29/21 2140   08/29/21 2130  ceFAZolin (ANCEF) IVPB 1 g/50 mL premix        1 g 100 mL/hr over 30 Minutes Intravenous  Once 08/29/21 2042 08/29/21 2231   08/29/21 1642  vancomycin (VANCOCIN) powder  Status:  Discontinued          As needed 08/29/21 1643 08/29/21 1806   08/29/21 1445  ceFAZolin (ANCEF) IVPB 2g/100 mL premix  Status:  Discontinued        2 g 200 mL/hr over 30 Minutes Intravenous On call to O.R. 08/29/21 1427 08/29/21 1445   08/29/21 1445  ceFAZolin (ANCEF) IVPB 1 g/50 mL premix  Status:  Discontinued        1 g 100 mL/hr over 30 Minutes Intravenous Every 8 hours 08/29/21 1442 08/29/21 2041   08/29/21 1444  ceFAZolin (ANCEF) 1-4 GM/50ML-% IVPB       Note to Pharmacy: Bobbie Stack M: cabinet override      08/29/21 1444 08/29/21 1556   08/29/21 1345  ceFAZolin (ANCEF) IVPB 2g/100 mL premix  Status:  Discontinued        2 g 200 mL/hr over 30 Minutes Intravenous  Once 08/29/21 1340 08/29/21 1445          Medications  apixaban  2.5 mg Oral BID   atorvastatin  40 mg Oral QHS   Chlorhexidine Gluconate Cloth  6 each Topical Q0600   docusate sodium  100 mg Oral BID   doxercalciferol  4 mcg Intravenous Q M,W,F-HD   ferric citrate  210 mg Oral TID WC   midodrine  10 mg Oral TID WC   molnupiravir EUA  4 capsule Oral BID   pantoprazole  40 mg Oral BID   pentafluoroprop-tetrafluoroeth       predniSONE  40 mg Oral QAC breakfast      Subjective:   Kendra Clay was seen and examined today AM.  No acute complaints.  Pain currently controlled if not moving.  BP still soft.  No fevers or chills, no nausea vomiting or diarrhea.  O2 sat stable  Objective:   Vitals:   09/02/21 1000 09/02/21 1015 09/02/21 1045 09/02/21 1100  BP: (!) 101/53 (!) 106/54 (!) 93/56 (!) 93/59  Pulse: 79 78 74 84  Resp: 18 15 17 18   Temp:      TempSrc:      SpO2: 94% 97% 95% 97%  Weight:      Height:       No intake or output data in the 24 hours ending 09/02/21  1107    Wt Readings from Last 3 Encounters:  09/02/21 70 kg  07/31/20 57.7 kg  06/28/20 55.5 kg   Physical Exam General: Alert and oriented x  3, NAD Cardiovascular: S1 S2 clear, RRR. Respiratory: CTAB, no wheezing Gastrointestinal: Soft, nontender, nondistended, NBS Ext: left UE in splint, RLE immobilizer, LLE dressing intact Psych: Normal affect and demeanor, alert and oriented x3   Data Reviewed:  I have personally reviewed following labs    CBC Lab Results  Component Value Date   WBC 6.3 08/31/2021   RBC 3.33 (L) 08/31/2021   HGB 9.7 (L) 08/31/2021   HCT 29.2 (L) 08/31/2021   MCV 87.7 08/31/2021   MCH 29.1 08/31/2021   PLT 100 (L) 08/31/2021   MCHC 33.2 08/31/2021   RDW 15.3 08/31/2021   LYMPHSABS 0.6 (L) 08/30/2021   MONOABS 0.2 08/30/2021   EOSABS 0.0 08/30/2021   BASOSABS 0.0 08/65/7846     Last metabolic panel Lab Results  Component Value Date   NA 126 (L) 09/02/2021   K 4.6 09/02/2021   CL 88 (L) 09/02/2021   CO2 25 09/02/2021   BUN 48 (H) 09/02/2021   CREATININE 8.30 (H) 09/02/2021   GLUCOSE 130 (H) 09/02/2021   GFRNONAA 5 (L) 09/02/2021   GFRAA 8 (L) 05/20/2018   CALCIUM 7.5 (L) 09/02/2021   PHOS 4.8 (H) 09/02/2021   PROT 6.3 (L) 08/30/2021   ALBUMIN 2.7 (L) 08/30/2021   BILITOT 0.6 08/30/2021   ALKPHOS 153 (H) 08/30/2021   AST 23 08/30/2021   ALT 11 08/30/2021   ANIONGAP 13 09/02/2021       Coagulation Profile: Recent Labs  Lab 08/29/21 1213  INR 1.2      Kendra Clay M.D. [facility] 09/02/2021, 11:07 AM  Available via Epic secure chat 7am-7pm After 7 pm, please refer to night coverage provider listed on amion.

## 2021-09-02 NOTE — Progress Notes (Signed)
Physical Therapy Treatment Patient Details Name: Kendra Clay MRN: [id number] DOB: Aug 26, 1960 Today's Date: 09/02/2021   History of Present Illness Kendra Clay is a 61 y.o. female with past medical history of ESRD on HD MWF, essential hypertension, coronary artery disease status post stent placement in 2017, dilated cardiomyopathy, chronic combined diastolic and systolic CHF, who initially presented to [facility] ED after a ground-level fall while walking up the stairs.  She incurred multiple fractures, to her left supracondylar distal femur fracture, right tibia shaft fracture, left distal radius and ulna fracture.  The patient underwent repair of those fractures on 08/29/21.    PT Comments    Session focused on bed mobility and transfer training. Pt received with BM; NT present to assist with peri care. Pt requiring two person mod-max assist for functional mobility. Transferred to chair via squat pivot towards right. Able to achieve increased right knee flexion today. Would benefit from AIR to address deficits and maximize functional mobility.    Recommendations for follow up therapy are one component of a multi-disciplinary discharge planning process, led by the attending physician.  Recommendations may be updated based on patient status, additional functional criteria and insurance authorization.  Follow Up Recommendations  Acute inpatient rehab (3hours/day)     Assistance Recommended at Discharge Intermittent Supervision/Assistance  Patient can return home with the following A lot of help with walking and/or transfers;A lot of help with bathing/dressing/bathroom;Assist for transportation;Help with stairs or ramp for entrance;Direct supervision/assist for medications management;Assistance with cooking/housework   Equipment Recommendations  Wheelchair cushion (measurements PT);Wheelchair (measurements PT)    Recommendations for Other Services       Precautions / Restrictions  Precautions Precautions: Fall Required Braces or Orthoses: Other Brace Other Brace: camboot on R Restrictions Weight Bearing Restrictions: Yes LUE Weight Bearing: Weight bear through elbow only RLE Weight Bearing: Weight bearing as tolerated LLE Weight Bearing: Weight bearing as tolerated     Mobility  Bed Mobility Overal bed mobility: Needs Assistance Bed Mobility: Supine to Sit, Rolling Rolling: Mod assist   Supine to sit: Max assist, +2 for physical assistance     General bed mobility comments: ModA for rolling to R/L for peri care. slowly moving each leg with assist, scooting hips around with bed pad, elevating trunk, for scooting hips forward to EOB    Transfers Overall transfer level: Needs assistance Equipment used: None Transfers: Bed to chair/wheelchair/BSC       Squat pivot transfers: Mod assist, +2 physical assistance     General transfer comment: Pt performed squat pivot from bed to chair towards right with modA. Cues for increased R knee flexion, pushing up with R hand, and hooking L arm around PT to facilitate    Ambulation/Gait                   Stairs             Wheelchair Mobility    Modified Rankin (Stroke Patients Only)       Balance Overall balance assessment: Needs assistance Sitting-balance support: No upper extremity supported, Feet supported Sitting balance-Leahy Scale: Fair                                      Cognition Arousal/Alertness: Awake/alert Behavior During Therapy: WFL for tasks assessed/performed Overall Cognitive Status: Within Functional Limits for tasks assessed  Exercises      General Comments        Pertinent Vitals/Pain Pain Assessment Pain Assessment: Faces Faces Pain Scale: Hurts whole lot Pain Location: BLE's with knee flexion, L wrist Pain Descriptors / Indicators: Aching, Grimacing, Guarding, Sore Pain  Intervention(s): Limited activity within patient's tolerance, Monitored during session, Premedicated before session    Home Living                          Prior Function            PT Goals (current goals can now be found in the care plan section) Acute Rehab PT Goals Patient Stated Goal: to return to independent Potential to Achieve Goals: Good Progress towards PT goals: Progressing toward goals    Frequency    Min 5X/week      PT Plan Current plan remains appropriate    Co-evaluation              AM-PAC PT "6 Clicks" Mobility   Outcome Measure  Help needed turning from your back to your side while in a flat bed without using bedrails?: A Lot Help needed moving from lying on your back to sitting on the side of a flat bed without using bedrails?: Total Help needed moving to and from a bed to a chair (including a wheelchair)?: Total Help needed standing up from a chair using your arms (e.g., wheelchair or bedside chair)?: Total Help needed to walk in hospital room?: Total Help needed climbing 3-5 steps with a railing? : Total 6 Click Score: 7    End of Session Equipment Utilized During Treatment: Gait belt;Other (comment) (R CAM boot) Activity Tolerance: Patient limited by pain Patient left: in chair;with call bell/phone within reach;with chair alarm set Nurse Communication: Mobility status;Need for lift equipment PT Visit Diagnosis: Other abnormalities of gait and mobility (R26.89);Difficulty in walking, not elsewhere classified (R26.2);Pain Pain - Right/Left: Left Pain - part of body: Hip     Time: 1771-1657 PT Time Calculation (min) (ACUTE ONLY): 26 min  Charges:  $Therapeutic Activity: 23-37 mins                     Wyona Almas, PT, DPT Acute Rehabilitation Services Office [phone]    Deno Etienne 09/02/2021, 4:12 PM

## 2021-09-02 NOTE — Progress Notes (Addendum)
[facility] Progress Note   Subjective:   Patient seen and examined at bedside.  Just completed dialysis.  Tolerated dialysis well.  Denies pain, CP, SOB, abdominal pain and n/v/d.    Objective Vitals:   09/02/21 1115 09/02/21 1130 09/02/21 1139 09/02/21 1200  BP: (!) 93/56 (!) 68/49 (S) (!) 92/57 (!) 94/59  Pulse: 87 85  80  Resp: 15 18 15 14   Temp:    98.3 F (36.8 C)  TempSrc:    Oral  SpO2: 99% 97% 98% 100%  Weight:    68 kg  Height:       Physical Exam General:chronically ill appearing female in NAD Heart:RRR, no mrg Lungs:mostly CTAB, scattered wheezing Abdomen:soft, NTND Extremities:no LE edema, R leg in boot,  L arm in splint Dialysis Access: RU AVF +b/t   Filed Weights   08/31/21 0500 09/02/21 0745 09/02/21 1200  Weight: 69.8 kg 70 kg 68 kg    Intake/Output Summary (Last 24 hours) at 09/02/2021 1219 Last data filed at 09/02/2021 1130 Gross per 24 hour  Intake --  Output 2000 ml  Net -2000 ml    Additional Objective Labs: Basic Metabolic Panel: Recent Labs  Lab 08/30/21 0344 08/31/21 0719 09/01/21 0736 09/02/21 0141  NA 130* 131* 129* 126*  K 4.7 3.4* 3.6 4.6  CL 91* 95* 91* 88*  CO2 24 24 27 25   GLUCOSE 133* 109* 95 130*  BUN 31* 20 34* 48*  CREATININE 7.48* 4.87* 7.11* 8.30*  CALCIUM 7.5* 6.7* 7.3* 7.5*  PHOS 5.0*  --   --  4.8*   Liver Function Tests: Recent Labs  Lab 08/29/21 1213 08/30/21 0344  AST 25 23  ALT 16 11  ALKPHOS 152* 153*  BILITOT 0.7 0.6  PROT 5.6* 6.3*  ALBUMIN 2.6* 2.7*    CBC: Recent Labs  Lab 08/29/21 1213 08/30/21 0344 08/31/21 0719 08/31/21 2157  WBC 2.6* 4.5 5.0 6.3  NEUTROABS 1.9 3.7  --   --   HGB 7.6* 11.2* 9.3* 9.7*  HCT 24.1* 33.3* 28.0* 29.2*  MCV 90.9 85.6 86.4 87.7  PLT 82* 96* 92* 100*    Medications:  sodium chloride     sodium chloride 10 mL/hr at 08/29/21 2200   methocarbamol (ROBAXIN) IV      apixaban  2.5 mg Oral BID   atorvastatin  40 mg Oral QHS   Chlorhexidine  Gluconate Cloth  6 each Topical Q0600   docusate sodium  100 mg Oral BID   doxercalciferol  4 mcg Intravenous Q M,W,F-HD   ferric citrate  210 mg Oral TID WC   midodrine  10 mg Oral TID WC   molnupiravir EUA  4 capsule Oral BID   pantoprazole  40 mg Oral BID   pentafluoroprop-tetrafluoroeth       [START ON 09/03/2021] predniSONE  20 mg Oral QAC breakfast   predniSONE  40 mg Oral QAC breakfast    Dialysis Orders: [facility] MWF  3h 2min  350/1.5   60.5kg  2/2 bath  P2  Hep 1200  RFA AVF - last HD 7/26 , post wt 60.4kg  - last Hb 9.5 on 7/26 - doxercalciferol 8 ug tiw IV - mircera 75 ug q 4wks, last 7/24   Assessment/Plan: SP fall/ multiple extremity fractures - s/p repair 7/27, per pmd / ortho COVID 19 w/hypoxia - on molnupiravir, prednisone taper, O2 reduced to 1L today ESRD - on HD MWF. HD today per regular schedule. HTN - Home meds on hold  since 7/28 d/t hypotension.  Started on midodrine 10mg  TID 7/30. Vol - does not appear volume overloaded.  UF as tolerated with HD. Anemia abl - Hgb 9.7, s/p 2 units pRBC on 08/29/21.  Last ESA given on 7/24. Transfuse prn for Hgb <7. 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 Ca trending up slowly, Sensipar on hold. Monitor trend. Cont auryxia as binder and IV vdra w/ HD tiw.  Nutrition - Renal diet w/fluid restrictions. Alb low, give protein supplements.  Vit D deficiency - Vit D 14. restart weekly Vit D Chronic HFrEF, CAD - per primary  Kendra Mow, PA-C [place] 09/02/2021,12:19 PM  LOS: 4 days   Nephrology attending: The patient was seen and examined at the bedside.  Chart reviewed and I agree with above. ESRD on HD with COVID-positive, had a fall with multiple extremities fracture status postrepair.  Managing for dialysis.  Patient received dialysis today with around 2 L ultrafiltration, discussed with HD nurse.  Tolerated well.  Continue rehab and current management.  Kendra Clay, [facility].

## 2021-09-03 DIAGNOSIS — S52502A Unspecified fracture of the lower end of left radius, initial encounter for closed fracture: Secondary | ICD-10-CM | POA: Diagnosis not present

## 2021-09-03 DIAGNOSIS — I5022 Chronic systolic (congestive) heart failure: Secondary | ICD-10-CM | POA: Diagnosis not present

## 2021-09-03 DIAGNOSIS — S82201B Unspecified fracture of shaft of right tibia, initial encounter for open fracture type I or II: Secondary | ICD-10-CM | POA: Diagnosis not present

## 2021-09-03 DIAGNOSIS — S72402A Unspecified fracture of lower end of left femur, initial encounter for closed fracture: Secondary | ICD-10-CM | POA: Diagnosis not present

## 2021-09-03 LAB — BASIC METABOLIC PANEL
Anion gap: 9 (ref 5–15)
BUN: 31 mg/dL — ABNORMAL HIGH (ref 6–20)
CO2: 28 mmol/L (ref 22–32)
Calcium: 7.7 mg/dL — ABNORMAL LOW (ref 8.9–10.3)
Chloride: 92 mmol/L — ABNORMAL LOW (ref 98–111)
Creatinine, Ser: 5.63 mg/dL — ABNORMAL HIGH (ref 0.44–1.00)
GFR, Estimated: 8 mL/min — ABNORMAL LOW (ref >60)
Glucose, Bld: 120 mg/dL — ABNORMAL HIGH (ref 70–99)
Potassium: 4.2 mmol/L (ref 3.5–5.1)
Sodium: 129 mmol/L — ABNORMAL LOW (ref 135–145)

## 2021-09-03 LAB — CBC
HCT: 29.4 % — ABNORMAL LOW (ref 36.0–46.0)
Hemoglobin: 9.7 g/dL — ABNORMAL LOW (ref 12.0–15.0)
MCH: 28.8 pg (ref 26.0–34.0)
MCHC: 33 g/dL (ref 30.0–36.0)
MCV: 87.2 fL (ref 80.0–100.0)
Platelets: 161 10*3/uL (ref 150–400)
RBC: 3.37 MIL/uL — ABNORMAL LOW (ref 3.87–5.11)
RDW: 15.2 % (ref 11.5–15.5)
WBC: 6.9 10*3/uL (ref 4.0–10.5)
nRBC: 0 % (ref 0.0–0.2)

## 2021-09-03 MED ORDER — MIDODRINE HCL 5 MG PO TABS
5.0000 mg | ORAL_TABLET | Freq: Three times a day (TID) | ORAL | Status: DC
  Administered 2021-09-03 (×3): 5 mg via ORAL
  Filled 2021-09-03 (×3): qty 1

## 2021-09-03 MED ORDER — CHLORHEXIDINE GLUCONATE CLOTH 2 % EX PADS
6.0000 | MEDICATED_PAD | Freq: Every day | CUTANEOUS | Status: DC
Start: 2021-09-03 — End: 2021-09-05
  Administered 2021-09-03 – 2021-09-05 (×2): 6 via TOPICAL

## 2021-09-03 NOTE — Progress Notes (Signed)
Pt upset about being left in chair for two hours on dayshift. This RN and CNA bathed pt, changed linen, and tx pt from chair to the bed. She was very thankful that we were able to assist her. Pt has no pain and is provided w/snacks.   Pt states that she is fine and is ordering breakfast.

## 2021-09-03 NOTE — Progress Notes (Addendum)
Occupational Therapy Treatment Patient Details Name: Kendra Clay MRN: [id number] DOB: 11-25-1960 Today's Date: 09/03/2021   History of present illness Kendra Clay is a 61 y.o. female with past medical history of ESRD on HD MWF, essential hypertension, coronary artery disease status post stent placement in 2017, dilated cardiomyopathy, chronic combined diastolic and systolic CHF, who initially presented to [facility] ED after a ground-level fall while walking up the stairs.  She incurred multiple fractures, to her left supracondylar distal femur fracture, right tibia shaft fracture, left distal radius and ulna fracture.  The patient underwent repair of those fractures on 08/29/21.   OT comments  Co-tx completed with PT this session focusing on functional transfers from bed to chair utilizing left platform walker, proper weight shift technique and posture when standing, and establishing LUE HEP. OT modified platform walker to allow patient to achieve and maintain appropriate standing posture while decreasing pain and providing the needed UE positioning to manage walker needing less physical assistance. After adjustments were made, pt demonstrated improved standing posture and verbalized increased comfort when standing. HEP provided regarding A/ROM shoulder, elbow, and hand exercises. Provided verbal and visual demonstration with patient returning demonstration and verbalizing understanding. HEP handout left on patient's room tray table. All needs met prior to therapy departure.   Recommendations for follow up therapy are one component of a multi-disciplinary discharge planning process, led by the attending physician.  Recommendations may be updated based on patient status, additional functional criteria and insurance authorization.    Follow Up Recommendations  Acute inpatient rehab (3hours/day)    Assistance Recommended at Discharge Frequent or constant Supervision/Assistance  Patient can return home  with the following  Two people to help with bathing/dressing/bathroom;Two people to help with walking and/or transfers;Assistance with cooking/housework;Assist for transportation;Help with stairs or ramp for entrance;Assistance with feeding         Precautions / Restrictions Precautions Precautions: Fall Precaution Comments: airborne/contact: COVID + Required Braces or Orthoses: Other Brace Other Brace: camboot on RLE Restrictions Weight Bearing Restrictions: Yes LUE Weight Bearing: Non weight bearing (weightbear through left elbow only) RLE Weight Bearing: Weight bearing as tolerated LLE Weight Bearing: Weight bearing as tolerated       Mobility        Patient Response: Anxious  Transfers Overall transfer level: Needs assistance Equipment used: Left platform walker Transfers: Sit to/from Stand, Bed to chair/wheelchair/BSC Sit to Stand: Mod assist, +2 physical assistance, From elevated surface     Step pivot transfers: Mod assist, +2 physical assistance, From elevated surface     General transfer comment: VC and tacticle cues provided for form and technique     Balance Overall balance assessment: Needs assistance Sitting-balance support: No upper extremity supported, Feet unsupported Sitting balance-Leahy Scale: Fair Sitting balance - Comments: sitting on EOB working on scooting towards the EOB.   Standing balance support: Bilateral upper extremity supported, During functional activity, Reliant on assistive device for balance Standing balance-Leahy Scale: Poor Standing balance comment: heavy reliance on platform walker and  therapists       ADL either performed or assessed with clinical judgement      Cognition Arousal/Alertness: Awake/alert Behavior During Therapy: WFL for tasks assessed/performed Overall Cognitive Status: Within Functional Limits for tasks assessed       General Comments: slight anxiety noted during session when provided with education  and multimodel cueing to correct form and/or technique during functional mobility and use of walker.        Exercises Shoulder  Exercises Shoulder Flexion: AROM, Left, 5 reps, Seated Shoulder ABduction: AROM, Left, 5 reps, Seated Elbow Flexion: AROM, Left, 10 reps, Seated Elbow Extension: AROM, Left, 10 reps, Seated Digit Composite Flexion: AROM, Left, 10 reps, Seated (3" hold in flexion) Composite Extension: AROM, Left, 10 reps, Seated (3" hold in flexion) Hand Exercises Thumb Abduction: AROM, Left, 10 reps, Seated Thumb Adduction: AROM, Left, 10 reps, Seated Other Exercises Other Exercises: towel roll squeeze; 10X gentle, left hand Other Exercises: towel/washcloth crumple, 5-10X, left hand Other Exercises: Composite finger abduction/adduction; 10X, left hand            Pertinent Vitals/ Pain       Pain Assessment Pain Assessment: 0-10 Pain Score: 2  Pain Location: left wrist Pain Descriptors / Indicators: Aching, Discomfort Pain Intervention(s): Monitored during session, Premedicated before session         Frequency  Min 2X/week        Progress Toward Goals  OT Goals(current goals can now be found in the care plan section)  Progress towards OT goals: Progressing toward goals     Plan Discharge plan remains appropriate;Frequency remains appropriate    Co-evaluation    PT/OT/SLP Co-Evaluation/Treatment: Yes Reason for Co-Treatment: To address functional/ADL transfers;Complexity of the patient's impairments (multi-system involvement)   OT goals addressed during session: Strengthening/ROM;Proper use of Adaptive equipment and DME      AM-PAC OT "6 Clicks" Daily Activity     Outcome Measure   Help from another person eating meals?: A Little Help from another person taking care of personal grooming?: A Little Help from another person toileting, which includes using toliet, bedpan, or urinal?: Total Help from another person bathing (including washing,  rinsing, drying)?: A Lot Help from another person to put on and taking off regular upper body clothing?: A Lot Help from another person to put on and taking off regular lower body clothing?: Total 6 Click Score: 12    End of Session Equipment Utilized During Treatment: Gait belt;Other (comment) (right platform RW)  OT Visit Diagnosis: Unsteadiness on feet (R26.81);Other abnormalities of gait and mobility (R26.89);Muscle weakness (generalized) (M62.81);Pain Pain - Right/Left: Left Pain - part of body: Arm (bilateral legs)   Activity Tolerance Patient tolerated treatment well   Patient Left in chair;with call bell/phone within reach;with chair alarm set           Time: 5520-8022 Co-tx time: 1055-1148 (24 mins total) OT Time Calculation (min): 50 min  Charges: OT General Charges $OT Visit: 1 Visit OT Treatments $Therapeutic Activity: 8-22 mins $Therapeutic Exercise: 8-22 mins  Ailene Ravel, OTR/L,CBIS  Supplemental OT - [facility] and [facility]   Tre Sanker, Clarene Duke 09/03/2021, 1:34 PM

## 2021-09-03 NOTE — Progress Notes (Signed)
Physical Therapy Treatment Patient Details Name: Kendra Clay MRN: [id number] DOB: 1960-02-04 Today's Date: 09/03/2021   History of Present Illness Kendra Clay is a 61 y.o. female with past medical history of ESRD on HD MWF, essential hypertension, coronary artery disease status post stent placement in 2017, dilated cardiomyopathy, chronic combined diastolic and systolic CHF, who initially presented to [facility] ED after a ground-level fall while walking up the stairs.  She incurred multiple fractures, to her left supracondylar distal femur fracture, right tibia shaft fracture, left distal radius and ulna fracture.  The patient underwent repair of those fractures on 08/29/21.    PT Comments    Pt progressing steadily towards her physical therapy goals; remains very motivated to participate and demonstrates good activity tolerance. Reports 2/10 left wrist pain. Focus on therapeutic exercises for BLE ROM/strengthening, bed mobility and transfer training. Pt requiring two person assist for step pivot transfers using left platform RW. OT adjusting platform for optimal positioning. Continue to recommend acute inpatient rehab (AIR) for post-acute therapy needs.    Recommendations for follow up therapy are one component of a multi-disciplinary discharge planning process, led by the attending physician.  Recommendations may be updated based on patient status, additional functional criteria and insurance authorization.  Follow Up Recommendations  Acute inpatient rehab (3hours/day)     Assistance Recommended at Discharge Intermittent Supervision/Assistance  Patient can return home with the following A lot of help with walking and/or transfers;A lot of help with bathing/dressing/bathroom;Assist for transportation;Help with stairs or ramp for entrance;Direct supervision/assist for medications management;Assistance with cooking/housework   Equipment Recommendations  Wheelchair cushion (measurements  PT);Wheelchair (measurements PT)    Recommendations for Other Services       Precautions / Restrictions Precautions Precautions: Fall Precaution Comments: airborne/contact: COVID + Required Braces or Orthoses: Other Brace Other Brace: camboot on RLE Restrictions Weight Bearing Restrictions: Yes LUE Weight Bearing: Weight bear through elbow only RLE Weight Bearing: Weight bearing as tolerated LLE Weight Bearing: Weight bearing as tolerated     Mobility  Bed Mobility Overal bed mobility: Needs Assistance Bed Mobility: Supine to Sit     Supine to sit: Min assist     General bed mobility comments: Pt progressing into long sitting without physical assist, minA for LLE negotiation. Cues for head/hip relationship with scooting to edge of bed. Guided with use of bed pad    Transfers Overall transfer level: Needs assistance Equipment used: Left platform walker Transfers: Sit to/from Stand, Bed to chair/wheelchair/BSC Sit to Stand: Mod assist, +2 physical assistance, From elevated surface   Step pivot transfers: Mod assist, +2 physical assistance, From elevated surface       General transfer comment: VC and tacticle cues provided for form and technique. Manual assist for moving RLE x 1    Ambulation/Gait                   Stairs             Wheelchair Mobility    Modified Rankin (Stroke Patients Only)       Balance Overall balance assessment: Needs assistance Sitting-balance support: No upper extremity supported, Feet unsupported Sitting balance-Leahy Scale: Fair     Standing balance support: Bilateral upper extremity supported, During functional activity, Reliant on assistive device for balance Standing balance-Leahy Scale: Poor Standing balance comment: heavy reliance on platform walker and  therapists  Cognition Arousal/Alertness: Awake/alert Behavior During Therapy: WFL for tasks  assessed/performed Overall Cognitive Status: Within Functional Limits for tasks assessed                                          Exercises General Exercises - Lower Extremity Quad Sets: Both, Other reps (comment), Supine (15 reps (2 sets)) Heel Slides: Both, 20 reps, Supine (2 sets of 10) Straight Leg Raises: Both, Supine, 20 reps (2 sets of 10)    General Comments        Pertinent Vitals/Pain Pain Assessment Pain Assessment: Faces Pain Score: 2  Pain Location: left wrist Pain Descriptors / Indicators: Aching, Discomfort Pain Intervention(s): Monitored during session    Home Living                          Prior Function            PT Goals (current goals can now be found in the care plan section) Acute Rehab PT Goals Patient Stated Goal: to return to independent Potential to Achieve Goals: Good Progress towards PT goals: Progressing toward goals    Frequency    Min 5X/week      PT Plan Current plan remains appropriate    Co-evaluation PT/OT/SLP Co-Evaluation/Treatment: Yes Reason for Co-Treatment: For patient/therapist safety;To address functional/ADL transfers;Complexity of the patient's impairments (multi-system involvement) PT goals addressed during session: Mobility/safety with mobility;Strengthening/ROM OT goals addressed during session: Strengthening/ROM;Proper use of Adaptive equipment and DME      AM-PAC PT "6 Clicks" Mobility   Outcome Measure  Help needed turning from your back to your side while in a flat bed without using bedrails?: A Little Help needed moving from lying on your back to sitting on the side of a flat bed without using bedrails?: A Little Help needed moving to and from a bed to a chair (including a wheelchair)?: Total Help needed standing up from a chair using your arms (e.g., wheelchair or bedside chair)?: Total Help needed to walk in hospital room?: Total Help needed climbing 3-5 steps with a  railing? : Total 6 Click Score: 10    End of Session Equipment Utilized During Treatment: Gait belt;Other (comment) (R CAM boot) Activity Tolerance: Patient limited by pain Patient left: in chair;with call bell/phone within reach;with chair alarm set Nurse Communication: Mobility status PT Visit Diagnosis: Other abnormalities of gait and mobility (R26.89);Difficulty in walking, not elsewhere classified (R26.2);Pain Pain - Right/Left: Left Pain - part of body: Hip     Time: 8280-0349 PT Time Calculation (min) (ACUTE ONLY): 41 min  Charges:  $Therapeutic Exercise: 8-22 mins $Therapeutic Activity: 8-22 mins                     Wyona Almas, PT, DPT Acute Rehabilitation Services Office [phone]    Deno Etienne 09/03/2021, 2:08 PM

## 2021-09-03 NOTE — Plan of Care (Signed)

## 2021-09-03 NOTE — Progress Notes (Signed)
Kendra Clay, is a 61 y.o. female, DOB - 11/05/1960, JME:[id number] Admit date - 08/29/2021    Outpatient Primary MD for the patient is Patient, [name]  LOS - 5  days  Chief Complaint  Patient presents with   Fall       Brief summary   Patient is a 61 year old female with ESRD on HD MWF, HTN, CAD status post stent in 2017, chronic combined diastolic and systolic CHF presented after a ground-level fall while walking up the stairs.  Patient incurred multiple fractures, underwent repair by orthopedic surgery on 08/29/2021. Patient denied any syncopal episode, no chest pain or acute shortness of breath. At baseline lives at home with her son and ambulates with a cane.  Patient did report diarrheal illness in the last few days and had felt weak.  Assessment & Plan    Principal Problem: Left supracondylar distal femur fracture Right open tibial shaft fracture Left distal radius and ulna fracture -Status post OR on 7/27, underwent ORIF of left distal femur fracture, IM nailing of right tibial shaft fracture, ORIF left distal radius fracture, perk fixation of left distal ulna fracture irrigation and debridement of right open tibial fracture. -Management per orthopedics -Pain currently controlled, Ortho recommended DOACs for DVT prophylaxis, started on apixaban -Continue PT OT, recommended CIR   Active problems  ESRD on hemodialysis MWF, history of polycystic kidney disease Acute on chronic hyponatremia -Baseline sodium 131-132, sodium improving 126 yesterday-> 129 -Nephrology following, continue HD per renal  COVID-19  -CRP 1.0, D-dimer 2.82, no acute infiltrates, however overnight developed hypoxia, was placed on 3 L O2.  Has diarrhea -Started on molnupiravir on 7/28, day #4/5 today -Tapered prednisone, hypoxia improved, off O2 today  Vitamin D deficiency Vitamin D  level 14.0 -Vitamin D oral replacement stopped, nephrology will give Hectorol with HD.  Volume management with  Chronic HFrEF, CAD, stent in 2017 -No recent echo in epic, echo 2017 showed EF of 40-45%, grade 1 DD, follows cardiology, Dr Otho Perl in [place].  Per office note, patient had 2D echo in 2022 which had shown normal left ventricular systolic function. -Volume management with HD  Essential hypertension -Patient was placed on midodrine 10 mg 3 times daily on 7/30 due to hypotension.   -BP now elevated, decreased midodrine to 5 mg 3 times daily -If BP remains elevated, low threshold to discontinue midodrine  Acute on chronic anemia, anemia of chronic disease/ESRD - Presented with hemoglobin of 7.6, at baseline hemoglobin ~10 -Patient was transfused 2 units on 7/27 -H&H stable  Pancytopenia with chronic thrombocytopenia -Counts improving, platelets 161 K   Code Status: Full CODE STATUS DVT Prophylaxis:  apixaban (ELIQUIS) tablet 2.5 mg Start: 08/30/21 2200 SCDs Start: 08/29/21 2116 apixaban (ELIQUIS) tablet 2.5 mg   Level of Care: Level of care: Med-Surg Family Communication: Updated patient   Disposition Plan:      Remains inpatient appropriate: Awaiting CIR Procedures:  Procedures: 08/29/2021 Open reduction internal fixation of left distal femur  Intramedullary nailing of right tibial shaft fracture Open reduction internal fixation of left distal radius fracture Percutaneous fixation of left distal ulna fracture Irrigation and debridement of right open tibia fracture  Consultants:   Orthopedics  Antimicrobials:   Anti-infectives (From admission, onward)    Start     Dose/Rate Route Frequency Ordered Stop   08/30/21 2200  molnupiravir EUA (LAGEVRIO) capsule 800 mg        4 capsule Oral 2 times daily 08/30/21 1727 09/04/21 1830   08/29/21 2215  ceFAZolin (ANCEF) IVPB 2g/100 mL premix  Status:  Discontinued        2 g 200 mL/hr over 30 Minutes Intravenous Every 8  hours 08/29/21 2116 08/29/21 2140   08/29/21 2130  ceFAZolin (ANCEF) IVPB 1 g/50 mL premix        1 g 100 mL/hr over 30 Minutes Intravenous  Once 08/29/21 2042 08/29/21 2231   08/29/21 1642  vancomycin (VANCOCIN) powder  Status:  Discontinued          As needed 08/29/21 1643 08/29/21 1806   08/29/21 1445  ceFAZolin (ANCEF) IVPB 2g/100 mL premix  Status:  Discontinued        2 g 200 mL/hr over 30 Minutes Intravenous On call to O.R. 08/29/21 1427 08/29/21 1445   08/29/21 1445  ceFAZolin (ANCEF) IVPB 1 g/50 mL premix  Status:  Discontinued        1 g 100 mL/hr over 30 Minutes Intravenous Every 8 hours 08/29/21 1442 08/29/21 2041   08/29/21 1444  ceFAZolin (ANCEF) 1-4 GM/50ML-% IVPB       Note to Pharmacy: Alycia Patten: cabinet override      08/29/21 1444 08/29/21 1556   08/29/21 1345  ceFAZolin (ANCEF) IVPB 2g/100 mL premix  Status:  Discontinued        2 g 200 mL/hr over 30 Minutes Intravenous  Once 08/29/21 1340 08/29/21 1445          Medications  apixaban  2.5 mg Oral BID   atorvastatin  40 mg Oral QHS   Chlorhexidine Gluconate Cloth  6 each Topical Q0600   Chlorhexidine Gluconate Cloth  6 each Topical Q0600   docusate sodium  100 mg Oral BID   doxercalciferol  4 mcg Intravenous Q M,W,F-HD   ferric citrate  210 mg Oral TID WC   midodrine  5 mg Oral TID WC   molnupiravir EUA  4 capsule Oral BID   pantoprazole  40 mg Oral BID   predniSONE  20 mg Oral QAC breakfast   [START ON 09/08/2021] Vitamin D (Ergocalciferol)  50,000 Units Oral Q7 days      Subjective:   Kendra Clay was seen and examined today AM.  Complaining of hiccups otherwise having better day today than yesterday.  Pain is controlled.  BP is now elevated.  Hypoxia improved, not on O2 during my exam.  Objective:   Vitals:   09/03/21 0500 09/03/21 0700 09/03/21 0800 09/03/21 0900  BP: (!) 159/74 (!) 145/97 (!) 143/76 121/76  Pulse: 65 79 66   Resp: 14 16 16 17   Temp:   98 F (36.7 C)   TempSrc:       SpO2: 97% 95% 96%   Weight:      Height:        Intake/Output Summary (Last 24 hours) at 09/03/2021 1327 Last data filed at 09/02/2021 2035 Gross per 24 hour  Intake 360 ml  Output --  Net 360 ml  Wt Readings from Last 3 Encounters:  09/02/21 68 kg  07/31/20 57.7 kg  06/28/20 55.5 kg   Physical Exam General: Alert and oriented x 3, NAD Cardiovascular: S1 S2 clear, RRR.  Respiratory: CTAB, no wheezing, rales or rhonchi Gastrointestinal: Soft, nontender, nondistended, NBS Ext: left UE in splint, LLE dressing intact, RLE in immobilizer Psych: Normal affect and demeanor, alert and oriented x3    Data Reviewed:  I have personally reviewed following labs    CBC Lab Results  Component Value Date   WBC 6.9 09/03/2021   RBC 3.37 (L) 09/03/2021   HGB 9.7 (L) 09/03/2021   HCT 29.4 (L) 09/03/2021   MCV 87.2 09/03/2021   MCH 28.8 09/03/2021   PLT 161 09/03/2021   MCHC 33.0 09/03/2021   RDW 15.2 09/03/2021   LYMPHSABS 0.6 (L) 08/30/2021   MONOABS 0.2 08/30/2021   EOSABS 0.0 08/30/2021   BASOSABS 0.0 54/49/2010     Last metabolic panel Lab Results  Component Value Date   NA 129 (L) 09/03/2021   K 4.2 09/03/2021   CL 92 (L) 09/03/2021   CO2 28 09/03/2021   BUN 31 (H) 09/03/2021   CREATININE 5.63 (H) 09/03/2021   GLUCOSE 120 (H) 09/03/2021   GFRNONAA 8 (L) 09/03/2021   GFRAA 8 (L) 05/20/2018   CALCIUM 7.7 (L) 09/03/2021   PHOS 4.8 (H) 09/02/2021   PROT 6.3 (L) 08/30/2021   ALBUMIN 2.7 (L) 08/30/2021   BILITOT 0.6 08/30/2021   ALKPHOS 153 (H) 08/30/2021   AST 23 08/30/2021   ALT 11 08/30/2021   ANIONGAP 9 09/03/2021       Coagulation Profile: Recent Labs  Lab 08/29/21 1213  INR 1.2      Tyrianna Lightle M.D. [facility] 09/03/2021, 1:27 PM  Available via Epic secure chat 7am-7pm After 7 pm, please refer to night coverage provider listed on amion.

## 2021-09-03 NOTE — Patient Instructions (Signed)
Repeat all exercises 10 times, 1-2 times per day.  2) Shoulder Flexion  Seated/Standing:         Begin with arms at your side with thumbs pointed up, slowly raise both arms up and forward towards overhead.       3) Horizontal abduction/adduction   Standing/standing:           Begin with arms straight out in front of you, bring out to the side in at "T" shape. Keep arms straight entire time.       5) Shoulder Abduction  Standing/seated      Begin with your arms next to your side. Slowly move your arms out to the side so that they go overhead, in a jumping jack or snow angel movement.   ELBOW: Flex your elbow all way up then all the way straight out. 10X     AROM: Finger Flexion / Extension   Actively bend fingers of right hand. Start with knuckles furthest from palm, and slowly make a fist. Hold __3__ seconds. Relax. Then straighten fingers as far as possible. Repeat ___10_ times per set. Do __1__ sets per session. Do __3-5__ sessions per day.  Copyright  VHI. All rights reserved.   Paper Crumpling Exercise   Begin with right palm down on piece of paper. Maintaining contact between surface and heel of hand, crumple paper into a ball. Repeat __10__ times per set. Do __1__ sets per session. Do _3-5___ sessions per day.  Copyright  VHI. All rights reserved.     Towel Roll Squeeze   With right forearm resting on surface, gently squeeze towel. Repeat __10__ times per set. Do __1__ sets per session. Do __3-5__ sessions per day.  Copyright  VHI. All rights reserved.   Abduction / Adduction (Active)    With hand flat on table, spread all fingers apart, then bring them together as close as possible. Repeat __10__ times. Do __3-5__ sessions per day.  Copyright  VHI. All rights reserved.  AROM: Thumb Abduction / Adduction   Actively pull right thumb away from palm as far as possible. Hold __3__ seconds. Then bring thumb back to touch fingers. Try  not to bend fingers toward thumb. Repeat __10__ times per set. Do _1___ sets per session. Do _3-5___ sessions per day.  Copyright  VHI. All rights reserved.

## 2021-09-03 NOTE — Progress Notes (Addendum)
[facility] Progress Note   Subjective:   Patient seen and examined at bedside.  Biggest complaint is hiccups for last 2 days.  Denies CP, SOB, abdominal pain, and n/v/d.   Objective Vitals:   09/03/21 0500 09/03/21 0700 09/03/21 0800 09/03/21 0900  BP: (!) 159/74 (!) 145/97 (!) 143/76 121/76  Pulse: 65 79 66   Resp: 14 16 16 17   Temp:   98 F (36.7 C)   TempSrc:      SpO2: 97% 95% 96%   Weight:      Height:       Physical Exam General:chronically ill appearing female in NAD Heart:RRR, no mrg Lungs:CTAB, nml WOB Abdomen:soft, NTND Extremities:no LE edema, R leg in boot, L arm in splint Dialysis Access: RU AVF +b/t   Filed Weights   08/31/21 0500 09/02/21 0745 09/02/21 1200  Weight: 69.8 kg 70 kg 68 kg    Intake/Output Summary (Last 24 hours) at 09/03/2021 0952 Last data filed at 09/02/2021 2035 Gross per 24 hour  Intake 500 ml  Output 2000 ml  Net -1500 ml    Additional Objective Labs: Basic Metabolic Panel: Recent Labs  Lab 08/30/21 0344 08/31/21 0719 09/01/21 0736 09/02/21 0141 09/03/21 0604  NA 130*   < > 129* 126* 129*  K 4.7   < > 3.6 4.6 4.2  CL 91*   < > 91* 88* 92*  CO2 24   < > 27 25 28   GLUCOSE 133*   < > 95 130* 120*  BUN 31*   < > 34* 48* 31*  CREATININE 7.48*   < > 7.11* 8.30* 5.63*  CALCIUM 7.5*   < > 7.3* 7.5* 7.7*  PHOS 5.0*  --   --  4.8*  --    < > = values in this interval not displayed.   Liver Function Tests: Recent Labs  Lab 08/29/21 1213 08/30/21 0344  AST 25 23  ALT 16 11  ALKPHOS 152* 153*  BILITOT 0.7 0.6  PROT 5.6* 6.3*  ALBUMIN 2.6* 2.7*   CBC: Recent Labs  Lab 08/29/21 1213 08/30/21 0344 08/31/21 0719 08/31/21 2157 09/03/21 0604  WBC 2.6* 4.5 5.0 6.3 6.9  NEUTROABS 1.9 3.7  --   --   --   HGB 7.6* 11.2* 9.3* 9.7* 9.7*  HCT 24.1* 33.3* 28.0* 29.2* 29.4*  MCV 90.9 85.6 86.4 87.7 87.2  PLT 82* 96* 92* 100* 161    Medications:  sodium chloride     sodium chloride 10 mL/hr at 08/29/21 2200    methocarbamol (ROBAXIN) IV      apixaban  2.5 mg Oral BID   atorvastatin  40 mg Oral QHS   Chlorhexidine Gluconate Cloth  6 each Topical Q0600   docusate sodium  100 mg Oral BID   doxercalciferol  4 mcg Intravenous Q M,W,F-HD   ferric citrate  210 mg Oral TID WC   midodrine  5 mg Oral TID WC   molnupiravir EUA  4 capsule Oral BID   pantoprazole  40 mg Oral BID   predniSONE  20 mg Oral QAC breakfast   [START ON 09/08/2021] Vitamin D (Ergocalciferol)  50,000 Units Oral Q7 days    Dialysis Orders: [facility] MWF  3h 88min  350/1.5   60.5kg  2/2 bath  P2  Hep 1200  RFA AVF - last HD 7/26 , post wt 60.4kg  - last Hb 9.5 on 7/26 - doxercalciferol 8 ug tiw IV - mircera 75 ug q 4wks,  last 7/24   Assessment/Plan: SP fall/ multiple extremity fractures - s/p repair 7/27, per pmd / ortho COVID 19 w/hypoxia - on molnupiravir, prednisone taper, on RA today ESRD - on HD MWF. HD tomorrow per regular schedule. HTN - Home meds on hold since 7/28 d/t hypotension.  Started on midodrine 10mg  TID 7/30 - BP elevated throughout the night, midodrine decreased to 5mg  TID, low threshold to d/c if BP elevated. Vol - does not appear volume overloaded.  UF as tolerated with HD. Anemia abl - Hgb 9.7, s/p 2 units pRBC on 08/29/21.  Last ESA given on 7/24. Transfuse prn for Hgb <7. 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 Ca trending up slowly, Sensipar on hold. Monitor trend. Cont auryxia as binder and IV vdra w/ HD tiw.  Nutrition - Renal diet w/fluid restrictions. Alb low, give protein supplements.  Vit D deficiency - Vit D 14. restart weekly Vit D Chronic HFrEF, CAD - per primary  Jen Mow, PA-C [facility] 09/03/2021,9:52 AM  LOS: 5 days   Nephrology attending: The patient was seen and examined at the bedside.  Chart reviewed and I agree with above. ESRD on HD with COVID-positive, had a fall with multiple extremities fracture status postrepair.  Managing for dialysis.  Tolerated dialysis well  yesterday with 2 L UF.  Next HD tomorrow.  Patient like to minimize blood draw to only during dialysis days.  I will communicate this with the primary team as well.  Katheran James, [facility].

## 2021-09-04 DIAGNOSIS — I10 Essential (primary) hypertension: Secondary | ICD-10-CM | POA: Diagnosis not present

## 2021-09-04 DIAGNOSIS — N186 End stage renal disease: Secondary | ICD-10-CM | POA: Diagnosis not present

## 2021-09-04 DIAGNOSIS — S82201B Unspecified fracture of shaft of right tibia, initial encounter for open fracture type I or II: Secondary | ICD-10-CM | POA: Diagnosis not present

## 2021-09-04 DIAGNOSIS — I5022 Chronic systolic (congestive) heart failure: Secondary | ICD-10-CM | POA: Diagnosis not present

## 2021-09-04 LAB — CBC
HCT: 27.5 % — ABNORMAL LOW (ref 36.0–46.0)
Hemoglobin: 9 g/dL — ABNORMAL LOW (ref 12.0–15.0)
MCH: 28.4 pg (ref 26.0–34.0)
MCHC: 32.7 g/dL (ref 30.0–36.0)
MCV: 86.8 fL (ref 80.0–100.0)
Platelets: 175 10*3/uL (ref 150–400)
RBC: 3.17 MIL/uL — ABNORMAL LOW (ref 3.87–5.11)
RDW: 14.9 % (ref 11.5–15.5)
WBC: 6.9 10*3/uL (ref 4.0–10.5)
nRBC: 0 % (ref 0.0–0.2)

## 2021-09-04 LAB — BASIC METABOLIC PANEL
Anion gap: 14 (ref 5–15)
BUN: 57 mg/dL — ABNORMAL HIGH (ref 6–20)
CO2: 25 mmol/L (ref 22–32)
Calcium: 7.5 mg/dL — ABNORMAL LOW (ref 8.9–10.3)
Chloride: 87 mmol/L — ABNORMAL LOW (ref 98–111)
Creatinine, Ser: 7.89 mg/dL — ABNORMAL HIGH (ref 0.44–1.00)
GFR, Estimated: 5 mL/min — ABNORMAL LOW (ref >60)
Glucose, Bld: 94 mg/dL (ref 70–99)
Potassium: 3.8 mmol/L (ref 3.5–5.1)
Sodium: 126 mmol/L — ABNORMAL LOW (ref 135–145)

## 2021-09-04 NOTE — Progress Notes (Signed)
PT Cancellation Note  Patient Details Name: CAMY LEDER MRN: [id number] DOB: 25-Nov-1960   Cancelled Treatment:    Reason Eval/Treat Not Completed: Patient at procedure or test/unavailable Currently receiving HD in room. PT will re-attempt as time allows.   Cissy Galbreath A. Gilford Rile PT, DPT Acute Rehabilitation Services Office [phone]    Linna Hoff 09/04/2021, 2:41 PM

## 2021-09-04 NOTE — Progress Notes (Addendum)
[facility] Progress Note   Subjective:   Patient seen and examined at bedside.  Hiccups improved today.  Denies CP, SOB, abdominal pain and n/v/d.   Objective Vitals:   09/03/21 0900 09/03/21 1400 09/03/21 2000 09/04/21 0429  BP: 121/76 (!) 152/67 (!) 187/99 (!) 179/74  Pulse:  66 65   Resp: 17 18 16 18   Temp:  98 F (36.7 C) 98.2 F (36.8 C)   TempSrc:   Oral Oral  SpO2:  95% 96% 98%  Weight:      Height:       Physical Exam General:chronically ill appearing female in NAD Heart:RRR, no mrg Lungs:CTAB, nml WOB on RA Abdomen:soft, NTND Extremities:no LE edema, L arm in splint, R leg in boot Dialysis Access: RU AVF +b/t   Filed Weights   08/31/21 0500 09/02/21 0745 09/02/21 1200  Weight: 69.8 kg 70 kg 68 kg    Intake/Output Summary (Last 24 hours) at 09/04/2021 0941 Last data filed at 09/04/2021 0500 Gross per 24 hour  Intake 240 ml  Output 1 ml  Net 239 ml    Additional Objective Labs: Basic Metabolic Panel: Recent Labs  Lab 08/30/21 0344 08/31/21 0719 09/01/21 0736 09/02/21 0141 09/03/21 0604  NA 130*   < > 129* 126* 129*  K 4.7   < > 3.6 4.6 4.2  CL 91*   < > 91* 88* 92*  CO2 24   < > 27 25 28   GLUCOSE 133*   < > 95 130* 120*  BUN 31*   < > 34* 48* 31*  CREATININE 7.48*   < > 7.11* 8.30* 5.63*  CALCIUM 7.5*   < > 7.3* 7.5* 7.7*  PHOS 5.0*  --   --  4.8*  --    < > = values in this interval not displayed.   Liver Function Tests: Recent Labs  Lab 08/29/21 1213 08/30/21 0344  AST 25 23  ALT 16 11  ALKPHOS 152* 153*  BILITOT 0.7 0.6  PROT 5.6* 6.3*  ALBUMIN 2.6* 2.7*   CBC: Recent Labs  Lab 08/29/21 1213 08/30/21 0344 08/31/21 0719 08/31/21 2157 09/03/21 0604  WBC 2.6* 4.5 5.0 6.3 6.9  NEUTROABS 1.9 3.7  --   --   --   HGB 7.6* 11.2* 9.3* 9.7* 9.7*  HCT 24.1* 33.3* 28.0* 29.2* 29.4*  MCV 90.9 85.6 86.4 87.7 87.2  PLT 82* 96* 92* 100* 161    Medications:  sodium chloride     sodium chloride 10 mL/hr at 08/29/21 2200    methocarbamol (ROBAXIN) IV      apixaban  2.5 mg Oral BID   atorvastatin  40 mg Oral QHS   Chlorhexidine Gluconate Cloth  6 each Topical Q0600   Chlorhexidine Gluconate Cloth  6 each Topical Q0600   docusate sodium  100 mg Oral BID   doxercalciferol  4 mcg Intravenous Q M,W,F-HD   ferric citrate  210 mg Oral TID WC   midodrine  5 mg Oral TID WC   molnupiravir EUA  4 capsule Oral BID   pantoprazole  40 mg Oral BID   predniSONE  20 mg Oral QAC breakfast   [START ON 09/08/2021] Vitamin D (Ergocalciferol)  50,000 Units Oral Q7 days    Dialysis Orders: [facility] MWF  3h 57min  350/1.5   60.5kg  2/2 bath  P2  Hep 1200  RFA AVF - last HD 7/26 , post wt 60.4kg  - last Hb 9.5 on 7/26 - doxercalciferol  8 ug tiw IV - mircera 75 ug q 4wks, last 7/24   Assessment/Plan: SP fall/ multiple extremity fractures - s/p repair 7/27, per pmd / ortho COVID 19 w/hypoxia - on molnupiravir, prednisone taper, on RA  ESRD - on HD MWF. HD today per regular schedule. HTN - Home meds on hold since 7/28 d/t hypotension.  Started on midodrine 10mg  TID 7/30, decreased to 5mg  TID yesterday d/t elevated BP, will d/c today.  Vol - does not appear volume overloaded.  UF as tolerated with HD. Anemia abl - Hgb 9.7, s/p 2 units pRBC on 08/29/21.  Last ESA given on 7/24. Transfuse prn for Hgb <7. 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 Ca trending up slowly, Sensipar on hold. Monitor trend. Cont auryxia as binder and IV vdra w/ HD tiw.  Nutrition - Renal diet w/fluid restrictions. Alb low, give protein supplements.  Vit D deficiency - Vit D 14. restart weekly Vit D Chronic HFrEF, CAD - per primary  Jen Mow, PA-C [facility] 09/04/2021,9:41 AM  LOS: 6 days   Nephrology attending: I have personally seen and examined the patient.  Chart reviewed and I agree with above. Regular dialysis today, continue current management.  Clinically stable.  Katheran James, [facility].

## 2021-09-04 NOTE — Progress Notes (Signed)
PROGRESS NOTE    Kendra Clay  YQM:[id number] DOB: 1960/11/08 DOA: 08/29/2021 PCP: Patient, [name]   Brief Narrative: No notes on file   Assessment and Plan:  Left supracondylar distal femur fracture Right open tibial shaft fracture Left distal radius and ulnar fracture Secondary to fall at home. Orthopedic surgery consulted and performed ORIF of left distal femur fracture, IM nailing of right tibial shaft fracture, ORIF of left distal radius fracture, percutaneous fixation of left distal ulna fracture and I&D of right open tibial fracture. Recommendation for Eliquis for VTE prophylaxis per orthopedic surgery. Plan for discharge to CIR once off of airborne isolation.  ESRD on HD Nephrology consult. Patient received hemodialysis on a MWF schedule. Patient is receiving bedside HD while on airborne precautions for COVID-19 infection.  Acute on chronic hyponatremia Labile. Management via hemodialysis. Asymptomatic.  COVID-19 infection Associated hypoxia without evidence of pneumonia. Patient treated with molnupiravir and prednisone. Currently asymptomatic.  -Complete molnupiravir -Continue prednisone; complete steroid course  Vitamin D deficiency Hectorol with HD per nephrology.  Chronic heart failure with reduced EF Stable. Volume management with HD.  CAD History of stent placement. No chest pain currently. Not previously on antiplatelet therapy. -Continue Lipitor  Primary hypertension Patient is on losartan, Imdur and Coreg as an outpatient which are currently held. Blood pressure is well controlled currently off home antihypertensives in setting of hemodialysis.  Acute on chronic anemia Anemia of chronic disease Acute anemia secondary to acute fractures and surgical management.  Pancytopenia Transient and resolved. Leukocytopenia and thrombocytopenia now resolved.  DVT prophylaxis: Eliquis Code Status:   Code Status: Full Code Family Communication: None at  bedside Disposition Plan: Discharge to acute inpatient rehabilitation once airborne precautions are lifted   Consultants:  Orthopedic surgery  Procedures:  Open reduction internal fixation of left distal femur Intramedullary nailing of right tibial shaft fracture Open reduction internal fixation of left distal radius fracture Percutaneous fixation of left distal ulna fracture Irrigation and debridement of right open tibia fracture  Antimicrobials: Vancomycin Cefazolin Molunpiravir    Subjective: Patient reports no issues overnight or this morning.  Objective: BP 136/64 (BP Location: Left Arm)   Pulse 77   Temp 98.2 F (36.8 C) (Oral)   Resp 18   Ht 5\' 3"  (1.6 m)   Wt 69.9 kg   SpO2 100%   BMI 27.30 kg/m   Examination:  General exam: Appears calm and comfortable Respiratory system: Clear to auscultation. Respiratory effort normal. Cardiovascular system: S1 & S2 heard, RRR. Gastrointestinal system: Abdomen is nondistended, soft and nontender. No organomegaly or masses felt. Normal bowel sounds heard. Central nervous system: Alert and oriented. No focal neurological deficits. Skin: No cyanosis. No rashes Psychiatry: Judgement and insight appear normal. Mood & affect appropriate.    Data Reviewed: I have personally reviewed following labs and imaging studies  CBC Lab Results  Component Value Date   WBC 6.9 09/04/2021   RBC 3.17 (L) 09/04/2021   HGB 9.0 (L) 09/04/2021   HCT 27.5 (L) 09/04/2021   MCV 86.8 09/04/2021   MCH 28.4 09/04/2021   PLT 175 09/04/2021   MCHC 32.7 09/04/2021   RDW 14.9 09/04/2021   LYMPHSABS 0.6 (L) 08/30/2021   MONOABS 0.2 08/30/2021   EOSABS 0.0 08/30/2021   BASOSABS 0.0 52/84/1324     Last metabolic panel Lab Results  Component Value Date   NA 126 (L) 09/04/2021   K 3.8 09/04/2021   CL 87 (L) 09/04/2021   CO2 25  09/04/2021   BUN 57 (H) 09/04/2021   CREATININE 7.89 (H) 09/04/2021   GLUCOSE 94 09/04/2021   GFRNONAA 5 (L)  09/04/2021   GFRAA 8 (L) 05/20/2018   CALCIUM 7.5 (L) 09/04/2021   PHOS 4.8 (H) 09/02/2021   PROT 6.3 (L) 08/30/2021   ALBUMIN 2.7 (L) 08/30/2021   BILITOT 0.6 08/30/2021   ALKPHOS 153 (H) 08/30/2021   AST 23 08/30/2021   ALT 11 08/30/2021   ANIONGAP 14 09/04/2021    GFR: Estimated Creatinine Clearance: 7.1 mL/min (A) (by C-G formula based on SCr of 7.89 mg/dL (H)).  Recent Results (from the past 240 hour(s))  Surgical pcr screen     Status: None   Collection Time: 08/29/21  2:38 PM   Specimen: Nasal Mucosa; Nasal Swab  Result Value Ref Range Status   MRSA, PCR NEGATIVE NEGATIVE Final   Staphylococcus aureus NEGATIVE NEGATIVE Final    Comment: (NOTE) The Xpert SA Assay (FDA approved for NASAL specimens in patients 35 years of age and older), is one component of a comprehensive surveillance program. It is not intended to diagnose infection nor to guide or monitor treatment. Performed at [facility], [place] [address] [place], [place] [address]   Resp Panel by RT-PCR (Flu A&B, Covid) Anterior Nasal Swab     Status: Abnormal   Collection Time: 08/30/21  6:37 AM   Specimen: Anterior Nasal Swab  Result Value Ref Range Status   SARS Coronavirus 2 by RT PCR POSITIVE (A) NEGATIVE Final    Comment: (NOTE) SARS-CoV-2 target nucleic acids are DETECTED.  The SARS-CoV-2 RNA is generally detectable in upper respiratory specimens during the acute phase of infection. Positive results are indicative of the presence of the identified virus, but do not rule out bacterial infection or co-infection with other pathogens not detected by the test. Clinical correlation with patient history and other diagnostic information is necessary to determine patient infection status. The expected result is Negative.  Fact Sheet for Patients: EntrepreneurPulse.com.au  Fact Sheet for Healthcare Providers: IncredibleEmployment.be  This test is not yet approved or  cleared by the [place] FDA and  has been authorized for detection and/or diagnosis of SARS-CoV-2 by FDA under an Emergency Use Authorization (EUA).  This EUA will remain in effect (meaning this test can be used) for the duration of  the COVID-19 declaration under Section 564(b)(1) of the A ct, 21 U.S.C. section 360bbb-3(b)(1), unless the authorization is terminated or revoked sooner.     Influenza A by PCR NEGATIVE NEGATIVE Final   Influenza B by PCR NEGATIVE NEGATIVE Final    Comment: (NOTE) The Xpert Xpress SARS-CoV-2/FLU/RSV plus assay is intended as an aid in the diagnosis of influenza from Nasopharyngeal swab specimens and should not be used as a sole basis for treatment. Nasal washings and aspirates are unacceptable for Xpert Xpress SARS-CoV-2/FLU/RSV testing.  Fact Sheet for Patients: EntrepreneurPulse.com.au  Fact Sheet for Healthcare Providers: IncredibleEmployment.be  This test is not yet approved or cleared by the [place] FDA and has been authorized for detection and/or diagnosis of SARS-CoV-2 by FDA under an Emergency Use Authorization (EUA). This EUA will remain in effect (meaning this test can be used) for the duration of the COVID-19 declaration under Section 564(b)(1) of the Act, 21 U.S.C. section 360bbb-3(b)(1), unless the authorization is terminated or revoked.  Performed at [facility], [place] [address], [place], [place] [address]       Radiology Studies: No results found.  LOS: 6 days    Cordelia Poche, MD [facility] 09/04/2021, 5:46 PM   If 7PM-7AM, please contact night-coverage [email or web address]

## 2021-09-04 NOTE — TOC Initial Note (Signed)
Transition of Care [facility]) - Initial/Assessment Note    Patient Details  Name: Kendra Clay MRN: [id number] Date of Birth: December 19, 1960  [facility] CM/SW Contact:    Sharin Mons, RN Phone Number: 09/04/2021, 3:33 PM  Clinical Narrative:                 Admitted after a fall. Suffered multi fx,  s/p repair 08/29/2021. COVID (+). From home with son. States on disability 2/2 ERSD, HD pt. Prior to injury independent with ADL's, uses cane/ walker intermittently.  Pt states would like to transition to CIR per PT's recommendation.  Pt on airborne precautions  x 10 days, 1st noted 7/28. Once completed pt can be considered for CIR. Pt states only interested in CIR.  TOC team following  and will assist  with needs...  Expected Discharge Plan: IP Rehab Facility Barriers to Discharge: Continued Medical Work up   Patient Goals and CMS Choice        Expected Discharge Plan and Services Expected Discharge Plan: [place]   Discharge Planning Services: CM Consult                                          Prior Living Arrangements/Services       Do you feel safe going back to the place where you live?: Yes               Activities of Daily Living Home Assistive Devices/Equipment: Eyeglasses, Environmental consultant (specify type), Cane (specify quad or straight) ADL Screening (condition at time of admission) Patient's cognitive ability adequate to safely complete daily activities?: Yes Is the patient deaf or have difficulty hearing?: No Does the patient have difficulty seeing, even when wearing glasses/contacts?: No Does the patient have difficulty concentrating, remembering, or making decisions?: No Patient able to express need for assistance with ADLs?: Yes Does the patient have difficulty dressing or bathing?: Yes Independently performs ADLs?: No Communication: Independent Dressing (OT): Needs assistance Is this a change from baseline?: Change from  baseline, expected to last <3days Grooming: Needs assistance Is this a change from baseline?: Change from baseline, expected to last <3 days Feeding: Independent Bathing: Needs assistance Is this a change from baseline?: Change from baseline, expected to last <3 days Toileting: Needs assistance Is this a change from baseline?: Change from baseline, expected to last <3 days In/Out Bed: Needs assistance Is this a change from baseline?: Change from baseline, expected to last <3 days Walks in Home: Needs assistance Is this a change from baseline?: Change from baseline, expected to last <3 days Does the patient have difficulty walking or climbing stairs?: Yes Weakness of Legs: Both Weakness of Arms/Hands: Left  Permission Sought/Granted   Permission granted to share information with : Yes, Verbal Permission Granted  Share Information with NAME: Catrena Vari  [phone]           Emotional Assessment Appearance:: Appears stated age Attitude/Demeanor/Rapport: Gracious Affect (typically observed): Accepting Orientation: : Oriented to Self, Oriented to Place, Oriented to  Time, Oriented to Situation Alcohol / Substance Use: Not Applicable Psych Involvement: No (comment)  Admission diagnosis:  Ground-level fall [W18.30XA] Patient Active Problem List   Diagnosis Date Noted   Pancytopenia ([place]) 08/30/2021   Essential hypertension 08/30/2021   Closed fracture of left distal femur ([place]) 08/29/2021   Fracture of shaft of right tibia  and fibula, open type I or II, initial encounter 08/29/2021   Closed fracture of left distal radius 08/29/2021   Ground-level fall 08/29/2021   COVID-19 virus infection 04/25/2020   Hypertensive urgency 04/25/2020   COPD (chronic obstructive pulmonary disease) ([place]) 04/25/2020   HLD (hyperlipidemia) 04/25/2020   Pelvic fracture ([place]) 04/25/2020   Pubic ramus fracture ([place]) 04/24/2020   History of anemia due to CKD 05/19/2018   Chronic HFrEF (heart  failure with reduced ejection fraction) ([place]) 05/19/2018   Tibia/fibula fracture, left, closed, initial encounter 05/18/2018   SOB (shortness of breath) 08/14/2015   Acute respiratory failure with hypoxia ([facility]) 08/14/2015   Normocytic normochromic anemia 08/14/2015   Polycystic kidney disease 07/17/2013   End stage renal disease ([place]) 08/26/2011   PCP:  Patient, [name] Pharmacy:   [facility] [place], [place] AT [address] [place] [address] Phone: [phone] Fax: [phone]  [facility] [address] [place] [address] Phone: [phone] Fax: [place], [place] [address] Phone: [phone] Fax: [phone]     Social Determinants of Health (SDOH) Interventions    Readmission Risk Interventions     No data to display

## 2021-09-05 DIAGNOSIS — N186 End stage renal disease: Secondary | ICD-10-CM | POA: Diagnosis not present

## 2021-09-05 DIAGNOSIS — I10 Essential (primary) hypertension: Secondary | ICD-10-CM | POA: Diagnosis not present

## 2021-09-05 DIAGNOSIS — I5022 Chronic systolic (congestive) heart failure: Secondary | ICD-10-CM | POA: Diagnosis not present

## 2021-09-05 DIAGNOSIS — S82201B Unspecified fracture of shaft of right tibia, initial encounter for open fracture type I or II: Secondary | ICD-10-CM | POA: Diagnosis not present

## 2021-09-05 MED ORDER — CHLORHEXIDINE GLUCONATE CLOTH 2 % EX PADS
6.0000 | MEDICATED_PAD | Freq: Every day | CUTANEOUS | Status: DC
  Administered 2021-09-05 – 2021-09-09 (×5): 6 via TOPICAL

## 2021-09-05 MED ORDER — DOXERCALCIFEROL 4 MCG/2ML IV SOLN
8.0000 ug | INTRAVENOUS | Status: DC
  Administered 2021-09-06 – 2021-09-09 (×2): 8 ug via INTRAVENOUS
  Filled 2021-09-05 (×2): qty 4

## 2021-09-05 MED ORDER — MIDODRINE HCL 5 MG PO TABS
5.0000 mg | ORAL_TABLET | ORAL | Status: DC
  Administered 2021-09-06: 5 mg via ORAL
  Filled 2021-09-05: qty 1

## 2021-09-05 NOTE — Progress Notes (Signed)
Physical Therapy Treatment Patient Details Name: Kendra Clay MRN: [id number] DOB: 06/02/1960 Today's Date: 09/05/2021   History of Present Illness Kendra Clay is a 61 y.o. female with past medical history of ESRD on HD MWF, essential hypertension, coronary artery disease status post stent placement in 2017, dilated cardiomyopathy, chronic combined diastolic and systolic CHF, who initially presented to [facility] ED after a ground-level fall while walking up the stairs.  She incurred multiple fractures, to her left supracondylar distal femur fracture, right tibia shaft fracture, left distal radius and ulna fracture.  The patient underwent repair of those fractures on 08/29/21.    PT Comments    Patient seen in conjunction with OT to maximize activity tolerance. Patient required modA+2 for sit to stand and step pivot transfer with L PFRW. Patient continues to have difficulty advancing R LE due to weakness and weight of CAM boot. Patient limited by pain but seems to be improving from previous sessions. Instructed patient on quad sets, SLR, heel slides, and hip abduction/adduction to perform in recliner and/or bed. Continue to recommend acute inpatient rehab (AIR) for post-acute therapy needs.     Recommendations for follow up therapy are one component of a multi-disciplinary discharge planning process, led by the attending physician.  Recommendations may be updated based on patient status, additional functional criteria and insurance authorization.  Follow Up Recommendations  Acute inpatient rehab (3hours/day)     Assistance Recommended at Discharge Intermittent Supervision/Assistance  Patient can return home with the following A lot of help with walking and/or transfers;A lot of help with bathing/dressing/bathroom;Assist for transportation;Help with stairs or ramp for entrance;Direct supervision/assist for medications management;Assistance with cooking/housework   Equipment Recommendations   Wheelchair cushion (measurements PT);Wheelchair (measurements PT)    Recommendations for Other Services       Precautions / Restrictions Precautions Precautions: Fall Required Braces or Orthoses: Other Brace Other Brace: camboot on RLE with transfers/mobility only Restrictions Weight Bearing Restrictions: Yes LUE Weight Bearing: Weight bear through elbow only RLE Weight Bearing: Weight bearing as tolerated LLE Weight Bearing: Weight bearing as tolerated     Mobility  Bed Mobility Overal bed mobility: Needs Assistance Bed Mobility: Supine to Sit     Supine to sit: Min assist          Transfers Overall transfer level: Needs assistance Equipment used: Left platform [name] Transfers: Sit to/from Stand, Bed to chair/wheelchair/BSC Sit to Stand: Mod assist, +2 physical assistance, From elevated surface   Step pivot transfers: Mod assist, +2 physical assistance, From elevated surface            Ambulation/Gait                   Stairs             Wheelchair Mobility    Modified Rankin (Stroke Patients Only)       Balance Overall balance assessment: Needs assistance Sitting-balance support: No upper extremity supported, Feet unsupported Sitting balance-Leahy Scale: Fair     Standing balance support: Bilateral upper extremity supported, During functional activity, Reliant on assistive device for balance Standing balance-Leahy Scale: Poor Standing balance comment: heavy reliance on platform [name] and external assist.                            Cognition Arousal/Alertness: Awake/alert Behavior During Therapy: WFL for tasks assessed/performed Overall Cognitive Status: Within Functional Limits for tasks assessed  Exercises Other Exercises Other Exercises: instructed on SLR, quad sets, heel slides, and hip abduction/adduciton    General Comments        Pertinent  Vitals/Pain Pain Assessment Pain Assessment: 0-10 Pain Score: 3  Pain Location: R ankle and L wrist Pain Descriptors / Indicators: Aching, Discomfort Pain Intervention(s): Limited activity within patient's tolerance, Monitored during session, Repositioned    Home Living                          Prior Function            PT Goals (current goals can now be found in the care plan section) Acute Rehab PT Goals PT Goal Formulation: With patient Time For Goal Achievement: 09/13/21 Potential to Achieve Goals: Good Progress towards PT goals: Progressing toward goals    Frequency    Min 5X/week      PT Plan Current plan remains appropriate    Co-evaluation PT/OT/SLP Co-Evaluation/Treatment: Yes Reason for Co-Treatment: Complexity of the patient's impairments (multi-system involvement);For patient/therapist safety PT goals addressed during session: Mobility/safety with mobility;Balance;Proper use of DME OT goals addressed during session: Strengthening/ROM      AM-PAC PT "6 Clicks" Mobility   Outcome Measure  Help needed turning from your back to your side while in a flat bed without using bedrails?: A Little Help needed moving from lying on your back to sitting on the side of a flat bed without using bedrails?: A Little Help needed moving to and from a bed to a chair (including a wheelchair)?: Total Help needed standing up from a chair using your arms (e.g., wheelchair or bedside chair)?: Total Help needed to walk in hospital room?: Total Help needed climbing 3-5 steps with a railing? : Total 6 Click Score: 10    End of Session Equipment Utilized During Treatment: Gait belt (R CAM boot) Activity Tolerance: Patient tolerated treatment well Patient left: in chair;with call bell/phone within reach;with chair alarm set Nurse Communication: Mobility status PT Visit Diagnosis: Other abnormalities of gait and mobility (R26.89);Difficulty in walking, not elsewhere  classified (R26.2);Pain Pain - Right/Left: Left Pain - part of body: Hip     Time: 1043-1106 PT Time Calculation (min) (ACUTE ONLY): 23 min  Charges:  $Therapeutic Activity: 8-22 mins                     [name] A. Gilford Rile PT, DPT Acute Rehabilitation Services Office [phone]    Linna Hoff 09/05/2021, 12:18 PM

## 2021-09-05 NOTE — Progress Notes (Signed)
Kendra Clay  KPV:[id number] DOB: 12-18-1960 DOA: 08/29/2021 PCP: Patient, [name]   Brief Narrative: Kendra Clay is a 61 y.o. female with a history of ESRD on HD, CAD s/p stent placement, chronic combined heart failure. Patient presented after a fall, suffering multiple fractures. Patient was managed by trauma surgery on admission. During admission, she was found to be COVID-19 positive and started on an antiviral and steroids for treatment. Plan for acute inpatient rehab on discharge.   Assessment and Plan:  Left supracondylar distal femur fracture Right open tibial shaft fracture Left distal radius and ulnar fracture Secondary to fall at home. Orthopedic surgery consulted and performed ORIF of left distal femur fracture, IM nailing of right tibial shaft fracture, ORIF of left distal radius fracture, percutaneous fixation of left distal ulna fracture and I&D of right open tibial fracture. Recommendation for Eliquis for VTE prophylaxis per orthopedic surgery. Plan for discharge to CIR once off of airborne isolation.  ESRD on HD Nephrology consult. Patient received hemodialysis on a MWF schedule. Patient is receiving bedside HD while on airborne precautions for COVID-19 infection.  Acute on chronic hyponatremia Labile. Management via hemodialysis. Asymptomatic.  COVID-19 infection Associated hypoxia without evidence of pneumonia. Patient treated with molnupiravir and prednisone. Currently asymptomatic. Completed molnupiravir and prednisone.  Vitamin D deficiency Hectorol with HD per nephrology.  Chronic heart failure with reduced EF Stable. Volume management with HD.  CAD History of stent placement. No chest pain currently. Not previously on antiplatelet therapy. -Continue Lipitor  Primary hypertension Patient is on losartan, Imdur and Coreg as an outpatient which are currently held. Blood pressure is well controlled currently off home  antihypertensives in setting of hemodialysis.  Acute on chronic anemia Anemia of chronic disease Acute anemia secondary to acute fractures and surgical management.  Pancytopenia Transient and resolved. Leukocytopenia and thrombocytopenia now resolved.  DVT prophylaxis: Eliquis Code Status:   Code Status: Full Code Family Communication: None at bedside Disposition Plan: Discharge to acute inpatient rehabilitation once airborne precautions are lifted   Consultants:  Orthopedic surgery  Procedures:  Open reduction internal fixation of left distal femur Intramedullary nailing of right tibial shaft fracture Open reduction internal fixation of left distal radius fracture Percutaneous fixation of left distal ulna fracture Irrigation and debridement of right open tibia fracture  Antimicrobials: Vancomycin Cefazolin Molunpiravir    Subjective: Continues to report no issues overnight.  Objective: BP (!) 121/54 (BP Location: Left Arm)   Pulse 90   Temp 98.5 F (36.9 C) (Oral)   Resp 18   Ht 5\' 3"  (1.6 m)   Wt 69.8 kg   SpO2 97%   BMI 27.26 kg/m   Examination:  General exam: Appears calm and comfortable Respiratory system: Clear to auscultation. Respiratory effort normal. Cardiovascular system: S1 & S2 heard, RRR.  Gastrointestinal system: Abdomen is nondistended, soft and nontender. Normal bowel sounds heard. Central nervous system: Alert and oriented. No focal neurological deficits. Psychiatry: Judgement and insight appear normal. Mood & affect appropriate.    Data Reviewed: I have personally reviewed following labs and imaging studies  CBC Lab Results  Component Value Date   WBC 6.9 09/04/2021   RBC 3.17 (L) 09/04/2021   HGB 9.0 (L) 09/04/2021   HCT 27.5 (L) 09/04/2021   MCV 86.8 09/04/2021   MCH 28.4 09/04/2021   PLT 175 09/04/2021   MCHC 32.7 09/04/2021   RDW 14.9 09/04/2021   LYMPHSABS 0.6 (L) 08/30/2021   MONOABS  0.2 08/30/2021   EOSABS 0.0  08/30/2021   BASOSABS 0.0 51/76/1607     Last metabolic panel Lab Results  Component Value Date   NA 126 (L) 09/04/2021   K 3.8 09/04/2021   CL 87 (L) 09/04/2021   CO2 25 09/04/2021   BUN 57 (H) 09/04/2021   CREATININE 7.89 (H) 09/04/2021   GLUCOSE 94 09/04/2021   GFRNONAA 5 (L) 09/04/2021   GFRAA 8 (L) 05/20/2018   CALCIUM 7.5 (L) 09/04/2021   PHOS 4.8 (H) 09/02/2021   PROT 6.3 (L) 08/30/2021   ALBUMIN 2.7 (L) 08/30/2021   BILITOT 0.6 08/30/2021   ALKPHOS 153 (H) 08/30/2021   AST 23 08/30/2021   ALT 11 08/30/2021   ANIONGAP 14 09/04/2021    GFR: Estimated Creatinine Clearance: 7.1 mL/min (A) (by C-G formula based on SCr of 7.89 mg/dL (H)).  Recent Results (from the past 240 hour(s))  Surgical pcr screen     Status: None   Collection Time: 08/29/21  2:38 PM   Specimen: Nasal Mucosa; Nasal Swab  Result Value Ref Range Status   MRSA, PCR NEGATIVE NEGATIVE Final   Staphylococcus aureus NEGATIVE NEGATIVE Final    Comment: (NOTE) The Xpert SA Assay (FDA approved for NASAL specimens in patients 58 years of age and older), is one component of a comprehensive surveillance program. It is not intended to diagnose infection nor to guide or monitor treatment. Performed at [facility], [place] [address], [place],  [address]   Resp Panel by RT-PCR (Flu A&B, Covid) Anterior Nasal Swab     Status: Abnormal   Collection Time: 08/30/21  6:37 AM   Specimen: Anterior Nasal Swab  Result Value Ref Range Status   SARS Coronavirus 2 by RT PCR POSITIVE (A) NEGATIVE Final    Comment: (NOTE) SARS-CoV-2 target nucleic acids are DETECTED.  The SARS-CoV-2 RNA is generally detectable in upper respiratory specimens during the acute phase of infection. Positive results are indicative of the presence of the identified virus, but do not rule out bacterial infection or co-infection with other pathogens not detected by the test. Clinical correlation with patient history and other  diagnostic information is necessary to determine patient infection status. The expected result is Negative.  Fact Sheet for Patients: EntrepreneurPulse.com.au  Fact Sheet for Healthcare Providers: IncredibleEmployment.be  This test is not yet approved or cleared by the [place] FDA and  has been authorized for detection and/or diagnosis of SARS-CoV-2 by FDA under an Emergency Use Authorization (EUA).  This EUA will remain in effect (meaning this test can be used) for the duration of  the COVID-19 declaration under Section 564(b)(1) of the A ct, 21 U.S.C. section 360bbb-3(b)(1), unless the authorization is terminated or revoked sooner.     Influenza A by PCR NEGATIVE NEGATIVE Final   Influenza B by PCR NEGATIVE NEGATIVE Final    Comment: (NOTE) The Xpert Xpress SARS-CoV-2/FLU/RSV plus assay is intended as an aid in the diagnosis of influenza from Nasopharyngeal swab specimens and should not be used as a sole basis for treatment. Nasal washings and aspirates are unacceptable for Xpert Xpress SARS-CoV-2/FLU/RSV testing.  Fact Sheet for Patients: EntrepreneurPulse.com.au  Fact Sheet for Healthcare Providers: IncredibleEmployment.be  This test is not yet approved or cleared by the [place] FDA and has been authorized for detection and/or diagnosis of SARS-CoV-2 by FDA under an Emergency Use Authorization (EUA). This EUA will remain in effect (meaning this test can be used) for the duration of the COVID-19  declaration under Section 564(b)(1) of the Act, 21 U.S.C. section 360bbb-3(b)(1), unless the authorization is terminated or revoked.  Performed at [facility], [place] [address], [place], [place] [address]       Radiology Studies: No results found.    LOS: 7 days    Cordelia Poche, MD [facility] 09/05/2021, 12:33 PM   If 7PM-7AM, please contact  night-coverage [email or web address]

## 2021-09-05 NOTE — Hospital Course (Addendum)
Kendra Clay is a 61 y.o. female with a history of ESRD on HD, CAD s/p stent placement, chronic combined heart failure. Patient presented after a fall, suffering multiple fractures. Patient was managed by trauma surgery on admission. During admission, she was found to be COVID-19 positive and started on an antiviral and steroids for treatment. Patient set up for discharge to acute inpatient rehabilitation.

## 2021-09-05 NOTE — Progress Notes (Signed)
Orthopaedic Trauma Progress Note  S: Patient doing okay having some pain in her ankle and wrist.  No significant other complaints.  Bleeding from her airborne precautions to the left before she can go to inpatient rehab.  O:  Vitals:   09/04/21 2002 09/05/21 0803  BP: 106/60 (!) 121/54  Pulse:  90  Resp: 16 18  Temp: 98 F (36.7 C) 98.5 F (36.9 C)  SpO2:  97%   No acute distress, awake alert and oriented x3  Left upper extremity: Splint is in place clean dry and intact.  Motor and sensory function to median, ulnar and radial nerve Lower extremity: Incisions are clean dry and intact.  Patient is neurovascularly intact Right lower extremity: Dressings removed.  Boot removed.  Traumatic laceration is clean and dry.  Surgical incisions are within normal limits.  Imaging: No new imaging  Labs:  Results for orders placed or performed during the hospital encounter of 08/29/21 (from the past 24 hour(s))  Basic metabolic panel     Status: Abnormal   Collection Time: 09/04/21  2:00 PM  Result Value Ref Range   Sodium 126 (L) 135 - 145 mmol/L   Potassium 3.8 3.5 - 5.1 mmol/L   Chloride 87 (L) 98 - 111 mmol/L   CO2 25 22 - 32 mmol/L   Glucose, Bld 94 70 - 99 mg/dL   BUN 57 (H) 6 - 20 mg/dL   Creatinine, Ser 7.89 (H) 0.44 - 1.00 mg/dL   Calcium 7.5 (L) 8.9 - 10.3 mg/dL   GFR, Estimated 5 (L) >60 mL/min   Anion gap 14 5 - 15  CBC     Status: Abnormal   Collection Time: 09/04/21  2:00 PM  Result Value Ref Range   WBC 6.9 4.0 - 10.5 K/uL   RBC 3.17 (L) 3.87 - 5.11 MIL/uL   Hemoglobin 9.0 (L) 12.0 - 15.0 g/dL   HCT 27.5 (L) 36.0 - 46.0 %   MCV 86.8 80.0 - 100.0 fL   MCH 28.4 26.0 - 34.0 pg   MCHC 32.7 30.0 - 36.0 g/dL   RDW 14.9 11.5 - 15.5 %   Platelets 175 150 - 400 K/uL   nRBC 0.0 0.0 - 0.2 %    Assessment: 61 year old female status post fall with multiple orthopedic injuries  Injuries: 1.  ORIF of left distal femur fracture 2.  I&D and intramedullary nailing of right open  tibia fracture 3.  Open reduction internal fixation of left distal radius and percutaneous fixation of left distal ulna  Weightbearing: Weightbearing as tolerated bilateral lower extremities, weightbearing as tolerated through elbow and left upper extremity and nonweightbearing through the wrist  Insicional and dressing care: Splint to left upper extremity, other incisions can be left open to air  Orthopedic device(s): Boot to right lower extremity for therapy and mobilization but can come off while she is in bed.  Dispo: PT and OT evaluation with plans for inpatient rehab likely next week  Follow - up plan:  Will continue to follow while inpatient.  Plan for x-rays next week.  Shona Needles, MD [facility] [phone] (office) [email or web address]

## 2021-09-05 NOTE — Plan of Care (Signed)

## 2021-09-05 NOTE — Progress Notes (Addendum)
[facility] Progress Note   Subjective:   Patient seen and examined at bedside.  Feeling better today.  Ortho removed boot & splint/dressing from LE extremities.  Feels much better without it.  Pain well controlled.  Denies CP, SOB, abdominal pain and n/v/d.   Objective Vitals:   09/04/21 1815 09/04/21 2002 09/05/21 0500 09/05/21 0803  BP: 136/68 106/60  (!) 121/54  Pulse:    90  Resp: 16 16  18   Temp:  98 F (36.7 C)  98.5 F (36.9 C)  TempSrc:  Oral  Oral  SpO2:    97%  Weight:   69.8 kg   Height:       Physical Exam General:chronically ill appearing female in NAD Heart:RRR, no mrg Lungs:CTAB, nml WOB on RA Abdomen:soft, NTND Extremities:1+ LE edema b/l Dialysis Access: aneurysmal RU AVF +b/t   Filed Weights   09/04/21 1339 09/04/21 1402 09/05/21 0500  Weight: 69.9 kg 69.9 kg 69.8 kg    Intake/Output Summary (Last 24 hours) at 09/05/2021 1008 Last data filed at 09/05/2021 0700 Gross per 24 hour  Intake 720 ml  Output 1200 ml  Net -480 ml    Additional Objective Labs: Basic Metabolic Panel: Recent Labs  Lab 08/30/21 0344 08/31/21 0719 09/02/21 0141 09/03/21 0604 09/04/21 1400  NA 130*   < > 126* 129* 126*  K 4.7   < > 4.6 4.2 3.8  CL 91*   < > 88* 92* 87*  CO2 24   < > 25 28 25   GLUCOSE 133*   < > 130* 120* 94  BUN 31*   < > 48* 31* 57*  CREATININE 7.48*   < > 8.30* 5.63* 7.89*  CALCIUM 7.5*   < > 7.5* 7.7* 7.5*  PHOS 5.0*  --  4.8*  --   --    < > = values in this interval not displayed.   Liver Function Tests: Recent Labs  Lab 08/29/21 1213 08/30/21 0344  AST 25 23  ALT 16 11  ALKPHOS 152* 153*  BILITOT 0.7 0.6  PROT 5.6* 6.3*  ALBUMIN 2.6* 2.7*   CBC: Recent Labs  Lab 08/29/21 1213 08/30/21 0344 08/31/21 0719 08/31/21 2157 09/03/21 0604 09/04/21 1400  WBC 2.6* 4.5 5.0 6.3 6.9 6.9  NEUTROABS 1.9 3.7  --   --   --   --   HGB 7.6* 11.2* 9.3* 9.7* 9.7* 9.0*  HCT 24.1* 33.3* 28.0* 29.2* 29.4* 27.5*  MCV 90.9 85.6 86.4  87.7 87.2 86.8  PLT 82* 96* 92* 100* 161 175   Medications:  sodium chloride     sodium chloride 10 mL/hr at 08/29/21 2200   methocarbamol (ROBAXIN) IV      apixaban  2.5 mg Oral BID   atorvastatin  40 mg Oral QHS   Chlorhexidine Gluconate Cloth  6 each Topical Q0600   Chlorhexidine Gluconate Cloth  6 each Topical Q0600   docusate sodium  100 mg Oral BID   doxercalciferol  4 mcg Intravenous Q M,W,F-HD   ferric citrate  210 mg Oral TID WC   pantoprazole  40 mg Oral BID   [START ON 09/08/2021] Vitamin D (Ergocalciferol)  50,000 Units Oral Q7 days    Dialysis Orders: [facility] MWF  3h 18min  350/1.5   60.5kg  2/2 bath  P2  Hep 1200  RFA AVF - last HD 7/26 , post wt 60.4kg  - last Hb 9.5 on 7/26 - doxercalciferol 8 ug tiw IV - mircera  75 ug q 4wks, last 7/24   Assessment/Plan: SP fall/ multiple extremity fractures - s/p repair 7/27, per pmd / ortho. To go to CIR once off precautions.  COVID 19 w/hypoxia - on molnupiravir, prednisone taper, on RA  ESRD - on HD MWF. HD tomorrow per regular schedule. HTN - Home meds on hold since 7/28 d/t hypotension.  Started on midodrine 10mg  TID 7/30, decreased to 5mg  TID 8/1 and d/c on 8/2 d/t elevated BP. BP drop w/HD, will add back midodrine pre HD for now.  BP in goal today. Vol - LE edema noted on exam with Na dropping, increase UF goal with HD tomorrow. Anemia abl - Hgb 9.0, s/p 2 units pRBC on 08/29/21.  Last ESA given on 7/24. Transfuse prn for Hgb <7. 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 CCa ok. Sensipar on hold. Monitor trend. Cont auryxia as binder. Hectorol dose was 1/2 of typical OP dose, increased to usual dose 48mcg qHD.  Nutrition - Renal diet w/fluid restrictions. Alb low, give protein supplements.  Vit D deficiency - Vit D 14. restart weekly Vit D Chronic HFrEF, CAD - per primary  Jen Mow, PA-C [facility] 09/05/2021,10:08 AM  LOS: 7 days   Nephrology attending.   I have personally seen and examined the patient.  Chart  reviewed.  I agree with above. Tolerating dialysis well.  Regular HD tomorrow, UF as tolerated.  Hypervolemic hyponatremia expect to improve after ultrafiltration.  Recommend fluid restriction.  COVID-positive.  Katheran James, [facility].

## 2021-09-05 NOTE — Progress Notes (Signed)
Occupational Therapy Treatment Patient Details Name: Kendra Clay MRN: [id number] DOB: Jun 24, 1960 Today's Date: 09/05/2021   History of present illness Kendra Clay is a 61 y.o. female with past medical history of ESRD on HD MWF, essential hypertension, coronary artery disease status post stent placement in 2017, dilated cardiomyopathy, chronic combined diastolic and systolic CHF, who initially presented to [facility] ED after a ground-level fall while walking up the stairs.  She incurred multiple fractures, to her left supracondylar distal femur fracture, right tibia shaft fracture, left distal radius and ulna fracture.  The patient underwent repair of those fractures on 08/29/21.   OT comments  OT/PT co-treatment session with focus on bed mobility, functional transfers and BUE AROM/strengthening in prep for ADLs. Patient able to transition from supine to EOB with Min A. Continues to require Mod A +2 for sit to stand and stand-pivot transfers with PFRW. Completed 1 set x10 reps each of LUE HEP to facilitate soft tissue elongation for pain management. Seated in recliner at conclusion of session. OT will continue to follow acutely.    Recommendations for follow up therapy are one component of a multi-disciplinary discharge planning process, led by the attending physician.  Recommendations may be updated based on patient status, additional functional criteria and insurance authorization.    Follow Up Recommendations  Acute inpatient rehab (3hours/day)    Assistance Recommended at Discharge Frequent or constant Supervision/Assistance  Patient can return home with the following  Two people to help with bathing/dressing/bathroom;Two people to help with walking and/or transfers;Assistance with cooking/housework;Assist for transportation;Help with stairs or ramp for entrance;Assistance with feeding   Equipment Recommendations  Other (comment) (Defer to next level of care.)    Recommendations for Other  Services      Precautions / Restrictions Precautions Precautions: Fall Required Braces or Orthoses: Other Brace Other Brace: camboot on RLE with transfers/mobility only Restrictions Weight Bearing Restrictions: Yes LUE Weight Bearing: Weight bear through elbow only RLE Weight Bearing: Weight bearing as tolerated LLE Weight Bearing: Weight bearing as tolerated       Mobility Bed Mobility Overal bed mobility: Needs Assistance Bed Mobility: Supine to Sit     Supine to sit: Min assist          Transfers Overall transfer level: Needs assistance Equipment used: Left platform walker Transfers: Sit to/from Stand, Bed to chair/wheelchair/BSC Sit to Stand: Mod assist, +2 physical assistance, From elevated surface     Step pivot transfers: Mod assist, +2 physical assistance, From elevated surface           Balance Overall balance assessment: Needs assistance Sitting-balance support: No upper extremity supported, Feet unsupported Sitting balance-Leahy Scale: Fair     Standing balance support: Bilateral upper extremity supported, During functional activity, Reliant on assistive device for balance Standing balance-Leahy Scale: Poor Standing balance comment: heavy reliance on platform walker and external assist.                           ADL either performed or assessed with clinical judgement   ADL Overall ADL's : Needs assistance/impaired                                       General ADL Comments: Defer to note for focus of treatment session.    Extremity/Trunk Assessment  Vision       Perception     Praxis      Cognition Arousal/Alertness: Awake/alert Behavior During Therapy: WFL for tasks assessed/performed Overall Cognitive Status: Within Functional Limits for tasks assessed                                          Exercises Exercises: General Upper Extremity General Exercises - Upper  Extremity Shoulder Flexion: AROM, 10 reps, Seated Shoulder ABduction: AROM, 10 reps, Seated Shoulder ADduction: AROM, 10 reps, Seated Elbow Flexion: AROM, 10 reps, Seated Elbow Extension: AROM, 10 reps, Seated Digit Composite Flexion: AROM, 10 reps, Seated Composite Extension: AROM, 10 reps, Seated Hand Exercises Thumb Abduction: AROM, 10 reps, Seated Thumb Adduction: AROM, 10 reps, Seated    Shoulder Instructions       General Comments      Pertinent Vitals/ Pain       Pain Assessment Pain Assessment: 0-10 Pain Score: 3  Pain Location: R ankle and L wrist Pain Descriptors / Indicators: Aching, Discomfort Pain Intervention(s): Limited activity within patient's tolerance, Monitored during session, Repositioned, Patient requesting pain meds-RN notified  Home Living                                          Prior Functioning/Environment              Frequency  Min 2X/week        Progress Toward Goals  OT Goals(current goals can now be found in the care plan section)  Progress towards OT goals: Progressing toward goals  Acute Rehab OT Goals Patient Stated Goal: To go to inpatient rehab OT Goal Formulation: With patient Time For Goal Achievement: 09/13/21 Potential to Achieve Goals: Good ADL Goals Pt Will Perform Upper Body Dressing: with set-up;with supervision;sitting Pt Will Perform Lower Body Dressing: with min assist;sit to/from stand Pt Will Transfer to Toilet: with min assist;ambulating;bedside commode Pt Will Perform Toileting - Clothing Manipulation and hygiene: with min assist;sit to/from stand Pt/caregiver will Perform Home Exercise Program: Increased strength;Right Upper extremity;With theraband;With written HEP provided Additional ADL Goal #1: Pt will be S in and OOB for basic ADLs  Plan Discharge plan remains appropriate;Frequency remains appropriate    Co-evaluation    PT/OT/SLP Co-Evaluation/Treatment: Yes Reason for  Co-Treatment: Complexity of the patient's impairments (multi-system involvement);For patient/therapist safety   OT goals addressed during session: Strengthening/ROM      AM-PAC OT "6 Clicks" Daily Activity     Outcome Measure   Help from another person eating meals?: A Little Help from another person taking care of personal grooming?: A Little Help from another person toileting, which includes using toliet, bedpan, or urinal?: Total Help from another person bathing (including washing, rinsing, drying)?: A Lot Help from another person to put on and taking off regular upper body clothing?: A Lot Help from another person to put on and taking off regular lower body clothing?: Total 6 Click Score: 12    End of Session Equipment Utilized During Treatment: Gait belt;Other (comment) (PFRW)  OT Visit Diagnosis: Unsteadiness on feet (R26.81);Other abnormalities of gait and mobility (R26.89);Muscle weakness (generalized) (M62.81);Pain Pain - part of body:  (R ankle; L wrist)   Activity Tolerance Patient tolerated treatment well;Patient limited by pain   Patient Left in chair;with  call bell/phone within reach;with chair alarm set   Nurse Communication Mobility status        Time: 2010-0712 OT Time Calculation (min): 31 min  Charges: OT General Charges $OT Visit: 1 Visit OT Treatments $Therapeutic Activity: 8-22 mins  Lauramae Kneisley H. OTR/L Supplemental OT, Department of rehab services [phone]  Kline Bulthuis R H. 09/05/2021, 11:26 AM

## 2021-09-05 NOTE — Progress Notes (Signed)
Inpatient Rehabilitation Admissions Coordinator    Noted COVID + 08/30/21. Patients are eligible to be considered for admit to the [facility] when cleared from airborne precautions by acute MD or Infectious disease regardless of onset day. Please clarify when that is to occur so we can assess candidacy at that time. Please call me with any questions.    Danne Baxter, RN, MSN Rehab Admissions Coordinator [phone] 09/05/2021 10:47 AM

## 2021-09-06 DIAGNOSIS — N186 End stage renal disease: Secondary | ICD-10-CM | POA: Diagnosis not present

## 2021-09-06 DIAGNOSIS — S82201B Unspecified fracture of shaft of right tibia, initial encounter for open fracture type I or II: Secondary | ICD-10-CM | POA: Diagnosis not present

## 2021-09-06 DIAGNOSIS — I5022 Chronic systolic (congestive) heart failure: Secondary | ICD-10-CM | POA: Diagnosis not present

## 2021-09-06 DIAGNOSIS — I10 Essential (primary) hypertension: Secondary | ICD-10-CM | POA: Diagnosis not present

## 2021-09-06 LAB — CBC
HCT: 26.3 % — ABNORMAL LOW (ref 36.0–46.0)
Hemoglobin: 8.7 g/dL — ABNORMAL LOW (ref 12.0–15.0)
MCH: 28.7 pg (ref 26.0–34.0)
MCHC: 33.1 g/dL (ref 30.0–36.0)
MCV: 86.8 fL (ref 80.0–100.0)
Platelets: 115 10*3/uL — ABNORMAL LOW (ref 150–400)
RBC: 3.03 MIL/uL — ABNORMAL LOW (ref 3.87–5.11)
RDW: 14.7 % (ref 11.5–15.5)
WBC: 5.4 10*3/uL (ref 4.0–10.5)
nRBC: 0 % (ref 0.0–0.2)

## 2021-09-06 LAB — BASIC METABOLIC PANEL
Anion gap: 11 (ref 5–15)
BUN: 22 mg/dL — ABNORMAL HIGH (ref 6–20)
CO2: 31 mmol/L (ref 22–32)
Calcium: 8.5 mg/dL — ABNORMAL LOW (ref 8.9–10.3)
Chloride: 92 mmol/L — ABNORMAL LOW (ref 98–111)
Creatinine, Ser: 3.89 mg/dL — ABNORMAL HIGH (ref 0.44–1.00)
GFR, Estimated: 13 mL/min — ABNORMAL LOW (ref >60)
Glucose, Bld: 101 mg/dL — ABNORMAL HIGH (ref 70–99)
Potassium: 4.2 mmol/L (ref 3.5–5.1)
Sodium: 134 mmol/L — ABNORMAL LOW (ref 135–145)

## 2021-09-06 LAB — RENAL FUNCTION PANEL
Albumin: 2.2 g/dL — ABNORMAL LOW (ref 3.5–5.0)
Anion gap: 10 (ref 5–15)
BUN: 41 mg/dL — ABNORMAL HIGH (ref 6–20)
CO2: 27 mmol/L (ref 22–32)
Calcium: 7.9 mg/dL — ABNORMAL LOW (ref 8.9–10.3)
Chloride: 91 mmol/L — ABNORMAL LOW (ref 98–111)
Creatinine, Ser: 6.74 mg/dL — ABNORMAL HIGH (ref 0.44–1.00)
GFR, Estimated: 7 mL/min — ABNORMAL LOW (ref >60)
Glucose, Bld: 93 mg/dL (ref 70–99)
Phosphorus: 3.3 mg/dL (ref 2.5–4.6)
Potassium: 4.2 mmol/L (ref 3.5–5.1)
Sodium: 128 mmol/L — ABNORMAL LOW (ref 135–145)

## 2021-09-06 MED ORDER — DARBEPOETIN ALFA 150 MCG/0.3ML IJ SOSY
150.0000 ug | PREFILLED_SYRINGE | INTRAMUSCULAR | Status: DC
  Administered 2021-09-09: 150 ug via INTRAVENOUS
  Filled 2021-09-06: qty 0.3

## 2021-09-06 MED ORDER — GUAIFENESIN ER 600 MG PO TB12
1200.0000 mg | ORAL_TABLET | Freq: Two times a day (BID) | ORAL | Status: DC
Start: 2021-09-06 — End: 2021-09-09
  Administered 2021-09-06 – 2021-09-09 (×7): 1200 mg via ORAL
  Filled 2021-09-06 (×7): qty 2

## 2021-09-06 MED ORDER — PROSOURCE PLUS PO LIQD
30.0000 mL | Freq: Two times a day (BID) | ORAL | Status: DC
  Administered 2021-09-06 – 2021-09-09 (×7): 30 mL via ORAL
  Filled 2021-09-06 (×7): qty 30

## 2021-09-06 MED ORDER — MIDODRINE HCL 5 MG PO TABS: 5.0000 mg | ORAL_TABLET | ORAL | Status: DC | PRN

## 2021-09-06 NOTE — Progress Notes (Signed)
HD called to confirm that they were on their way to bedside. And to go ahead and give Midodrine because pt BP drops during HD. Nurse went in to give med pt stated that she did want to take it until later. Nurse explained that HD was on the way to bedside now and that why it was being given. Pt took Medication then and put it under her tongue and then took it out when I turned my back and put it on the table. She stated that she dose it all the time and then take the meds later.

## 2021-09-06 NOTE — Progress Notes (Signed)
Inpatient Rehabilitation Admissions Coordinator   I will place rehab consult as I was notified that her [place] isolation would be removed on 8/7. We will assess candidacy for a possible CIR admit .  Danne Baxter, RN, MSN Rehab Admissions Coordinator [phone] 09/06/2021 8:11 AM

## 2021-09-06 NOTE — Progress Notes (Signed)
PT Cancellation Note  Patient Details Name: Kendra Clay MRN: [id number] DOB: 06-09-1960   Cancelled Treatment:    Reason Eval/Treat Not Completed: Patient at procedure or test/unavailable Currently receiving HD treatment in room. Will re-attempt as schedule allows.   Saatvik Thielman A. Gilford Rile PT, DPT Acute Rehabilitation Services Office [phone]    Linna Hoff 09/06/2021, 12:45 PM

## 2021-09-06 NOTE — Progress Notes (Signed)
PROGRESS NOTE    Kendra Clay  VPX:[id number] DOB: 1960/05/18 DOA: 08/29/2021 PCP: Patient, [name]   Brief Narrative: Kendra Clay is a 61 y.o. female with a history of ESRD on HD, CAD s/p stent placement, chronic combined heart failure. Patient presented after a fall, suffering multiple fractures. Patient was managed by trauma surgery on admission. During admission, she was found to be COVID-19 positive and started on an antiviral and steroids for treatment. Plan for acute inpatient rehab on discharge.   Assessment and Plan:  Left supracondylar distal femur fracture Right open tibial shaft fracture Left distal radius and ulnar fracture Secondary to fall at home. Orthopedic surgery consulted and performed ORIF of left distal femur fracture, IM nailing of right tibial shaft fracture, ORIF of left distal radius fracture, percutaneous fixation of left distal ulna fracture and I&D of right open tibial fracture. Recommendation for Eliquis for VTE prophylaxis per orthopedic surgery. Plan for discharge to CIR once off of airborne isolation.  ESRD on HD Nephrology consult. Patient received hemodialysis on a MWF schedule. Patient is receiving bedside HD while on airborne precautions for COVID-19 infection.  Acute on chronic hyponatremia Labile. Management via hemodialysis. Asymptomatic.  COVID-19 infection Associated hypoxia without evidence of pneumonia. Patient treated with molnupiravir and prednisone. Currently asymptomatic. Completed molnupiravir and prednisone. -Flutter valve -Mucinex  Vitamin D deficiency Hectorol with HD per nephrology.  Chronic heart failure with reduced EF Stable. Volume management with HD.  CAD History of stent placement. No chest pain currently. Not previously on antiplatelet therapy. -Continue Lipitor  Primary hypertension Patient is on losartan, Imdur and Coreg as an outpatient which are currently held. Blood pressure is well controlled  currently off home antihypertensives in setting of hemodialysis.  Acute on chronic anemia Anemia of chronic disease Acute anemia secondary to acute fractures and surgical management.  Pancytopenia Transient and resolved. Leukocytopenia and thrombocytopenia now resolved.  DVT prophylaxis: Eliquis Code Status:   Code Status: Full Code Family Communication: None at bedside Disposition Plan: Discharge to acute inpatient rehabilitation once airborne precautions are lifted. Anticipate medical readiness for discharge on 8/7 morning.   Consultants:  Orthopedic surgery  Procedures:  Open reduction internal fixation of left distal femur Intramedullary nailing of right tibial shaft fracture Open reduction internal fixation of left distal radius fracture Percutaneous fixation of left distal ulna fracture Irrigation and debridement of right open tibia fracture  Antimicrobials: Vancomycin Cefazolin Molunpiravir    Subjective: Patient feels like she needs to cough up phlegm. No other issues. Currently receiving HD at bedside  Objective: BP 137/69 (BP Location: Left Arm)   Pulse 96   Temp 98.4 F (36.9 C)   Resp 19   Ht 5\' 3"  (1.6 m)   Wt 69.7 kg   SpO2 100%   BMI 27.22 kg/m   Examination:  General exam: Appears calm and comfortable Respiratory system: Respiratory effort normal. Gastrointestinal system: Abdomen is non-distended Central nervous system: Alert and oriented. No focal neurological deficits. Musculoskeletal: No edema. No calf tenderness Psychiatry: Judgement and insight appear normal. Mood & affect appropriate.    Data Reviewed: I have personally reviewed following labs and imaging studies  CBC Lab Results  Component Value Date   WBC 5.4 09/06/2021   RBC 3.03 (L) 09/06/2021   HGB 8.7 (L) 09/06/2021   HCT 26.3 (L) 09/06/2021   MCV 86.8 09/06/2021   MCH 28.7 09/06/2021   PLT 115 (L) 09/06/2021   MCHC 33.1 09/06/2021   RDW 14.7 09/06/2021  LYMPHSABS 0.6  (L) 08/30/2021   MONOABS 0.2 08/30/2021   EOSABS 0.0 08/30/2021   BASOSABS 0.0 88/41/6606     Last metabolic panel Lab Results  Component Value Date   NA 128 (L) 09/06/2021   K 4.2 09/06/2021   CL 91 (L) 09/06/2021   CO2 27 09/06/2021   BUN 41 (H) 09/06/2021   CREATININE 6.74 (H) 09/06/2021   GLUCOSE 93 09/06/2021   GFRNONAA 7 (L) 09/06/2021   GFRAA 8 (L) 05/20/2018   CALCIUM 7.9 (L) 09/06/2021   PHOS 3.3 09/06/2021   PROT 6.3 (L) 08/30/2021   ALBUMIN 2.2 (L) 09/06/2021   BILITOT 0.6 08/30/2021   ALKPHOS 153 (H) 08/30/2021   AST 23 08/30/2021   ALT 11 08/30/2021   ANIONGAP 10 09/06/2021    GFR: Estimated Creatinine Clearance: 8.3 mL/min (A) (by C-G formula based on SCr of 6.74 mg/dL (H)).  Recent Results (from the past 240 hour(s))  Surgical pcr screen     Status: None   Collection Time: 08/29/21  2:38 PM   Specimen: Nasal Mucosa; Nasal Swab  Result Value Ref Range Status   MRSA, PCR NEGATIVE NEGATIVE Final   Staphylococcus aureus NEGATIVE NEGATIVE Final    Comment: (NOTE) The Xpert SA Assay (FDA approved for NASAL specimens in patients 69 years of age and older), is one component of a comprehensive surveillance program. It is not intended to diagnose infection nor to guide or monitor treatment. Performed at [facility], [place] [address] [place], [place] [address]   Resp Panel by RT-PCR (Flu A&B, Covid) Anterior Nasal Swab     Status: Abnormal   Collection Time: 08/30/21  6:37 AM   Specimen: Anterior Nasal Swab  Result Value Ref Range Status   SARS Coronavirus 2 by RT PCR POSITIVE (A) NEGATIVE Final    Comment: (NOTE) SARS-CoV-2 target nucleic acids are DETECTED.  The SARS-CoV-2 RNA is generally detectable in upper respiratory specimens during the acute phase of infection. Positive results are indicative of the presence of the identified virus, but do not rule out bacterial infection or co-infection with other pathogens not detected by the test.  Clinical correlation with patient history and other diagnostic information is necessary to determine patient infection status. The expected result is Negative.  Fact Sheet for Patients: EntrepreneurPulse.com.au  Fact Sheet for Healthcare Providers: IncredibleEmployment.be  This test is not yet approved or cleared by the [place] FDA and  has been authorized for detection and/or diagnosis of SARS-CoV-2 by FDA under an Emergency Use Authorization (EUA).  This EUA will remain in effect (meaning this test can be used) for the duration of  the COVID-19 declaration under Section 564(b)(1) of the A ct, 21 U.S.C. section 360bbb-3(b)(1), unless the authorization is terminated or revoked sooner.     Influenza A by PCR NEGATIVE NEGATIVE Final   Influenza B by PCR NEGATIVE NEGATIVE Final    Comment: (NOTE) The Xpert Xpress SARS-CoV-2/FLU/RSV plus assay is intended as an aid in the diagnosis of influenza from Nasopharyngeal swab specimens and should not be used as a sole basis for treatment. Nasal washings and aspirates are unacceptable for Xpert Xpress SARS-CoV-2/FLU/RSV testing.  Fact Sheet for Patients: EntrepreneurPulse.com.au  Fact Sheet for Healthcare Providers: IncredibleEmployment.be  This test is not yet approved or cleared by the [place] FDA and has been authorized for detection and/or diagnosis of SARS-CoV-2 by FDA under an Emergency Use Authorization (EUA). This EUA will remain in effect (meaning this test can be used)  for the duration of the COVID-19 declaration under Section 564(b)(1) of the Act, 21 U.S.C. section 360bbb-3(b)(1), unless the authorization is terminated or revoked.  Performed at [facility], [place] [address], [place], [place] [address]       Radiology Studies: No results found.    LOS: 8 days    Cordelia Poche, MD [facility] 09/06/2021, 11:36 AM   If  7PM-7AM, please contact night-coverage [email or web address]

## 2021-09-06 NOTE — Progress Notes (Addendum)
[facility] Progress Note   Subjective:  Seen during HD - tolerating fine. No CP/dyspnea. Says was given midodrine early this morning - usually only takes prn with HD, will change the dosing instructions for her.  Objective Vitals:   09/06/21 0857 09/06/21 0900 09/06/21 0930 09/06/21 1000  BP: 122/69 (!) 137/58 132/65 126/71  Pulse: 80 80 86   Resp:      Temp:      TempSrc:      SpO2: 95% 100% 91%   Weight:      Height:       Physical Exam General: Well appearing woman, NAD. Room air Heart: RRR; no murmur Lungs: CTAB, no rales Abdomen: soft Extremities: No LE edema; RLE with bruising and 2 sutured incisions. L forearm bandaged Dialysis Access: RUE AVF + thrill  Additional Objective Labs: Basic Metabolic Panel: Recent Labs  Lab 09/02/21 0141 09/03/21 0604 09/04/21 1400 09/06/21 0850  NA 126* 129* 126* 128*  K 4.6 4.2 3.8 4.2  CL 88* 92* 87* 91*  CO2 25 28 25 27   GLUCOSE 130* 120* 94 93  BUN 48* 31* 57* 41*  CREATININE 8.30* 5.63* 7.89* 6.74*  CALCIUM 7.5* 7.7* 7.5* 7.9*  PHOS 4.8*  --   --  3.3   Liver Function Tests: Recent Labs  Lab 09/06/21 0850  ALBUMIN 2.2*   CBC: Recent Labs  Lab 08/31/21 0719 08/31/21 2157 09/03/21 0604 09/04/21 1400 09/06/21 0850  WBC 5.0 6.3 6.9 6.9 5.4  HGB 9.3* 9.7* 9.7* 9.0* 8.7*  HCT 28.0* 29.2* 29.4* 27.5* 26.3*  MCV 86.4 87.7 87.2 86.8 86.8  PLT 92* 100* 161 175 115*   Medications:  sodium chloride     sodium chloride 10 mL/hr at 08/29/21 2200   methocarbamol (ROBAXIN) IV      apixaban  2.5 mg Oral BID   atorvastatin  40 mg Oral QHS   Chlorhexidine Gluconate Cloth  6 each Topical Q0600   docusate sodium  100 mg Oral BID   doxercalciferol  8 mcg Intravenous Q M,W,F-HD   ferric citrate  210 mg Oral TID WC   midodrine  5 mg Oral Q M,W,F-HD   pantoprazole  40 mg Oral BID   [START ON 09/08/2021] Vitamin D (Ergocalciferol)  50,000 Units Oral Q7 days    Dialysis Orders: [facility] MWF  3h 67min   350/1.5   60.5kg  2/2 bath  P2  Hep 1200  RFA AVF - last Hb 9.5 on 7/26 - doxercalciferol 76mcg IV q HD - Mircera 57mcg IV q 4wks, last 7/24  Assessment/Plan: Fall w/ traumatic extremity Fx: S/p ORIF L distal femur Fx, ORIF L distal radius/ulnar Fx, IM nailing of R open tibial Fx on 08/29/21.  Per ortho. On Eliquis for DVT prophylaxis. To CIR once off COVID precautions (8/7). COVID: Hypoxic on admit. Finished course of molnupiravir + steroids. ESRD: Continue HD on MWF schedule - HD today. HTN/volume: BP stable - prior OP meds on hold (losartan, isosorbide, coreg). Uses midodrine only prn with HD - will adjust orders. Weight much higher but does not appear overloaded, ? from metal plates in extremities. Anemia of ESRD: Hgb 8.7. ESA due on 8/7 - ordering. S/p 2U PRBCs on 7/27. Follow. Secondary hyperparathyroidism: CorrCa/Phos good. Continue Auryxia as binder + VDRA. Nutrition: Alb low, adding supplements for wound healing. CAD/HFrEF Dispo: To CIR next week  [name], PA-C 09/06/2021, 10:07 AM  [facility]  Nephrology attending: I have seen and examined the patient.  Chart reviewed.  I agree with above. Clinically remains stable.  Plan for regular dialysis today.  Continue current management. Katheran James, CKA

## 2021-09-07 DIAGNOSIS — I10 Essential (primary) hypertension: Secondary | ICD-10-CM | POA: Diagnosis not present

## 2021-09-07 DIAGNOSIS — S82201B Unspecified fracture of shaft of right tibia, initial encounter for open fracture type I or II: Secondary | ICD-10-CM | POA: Diagnosis not present

## 2021-09-07 DIAGNOSIS — I5022 Chronic systolic (congestive) heart failure: Secondary | ICD-10-CM | POA: Diagnosis not present

## 2021-09-07 DIAGNOSIS — N186 End stage renal disease: Secondary | ICD-10-CM | POA: Diagnosis not present

## 2021-09-07 NOTE — Progress Notes (Signed)
ANTICOAGULATION CONSULT NOTE   Pharmacy Consult for apixaban Indication: VTE prophylaxis  Allergies  Allergen Reactions   E-Mycin [Erythromycin] Hives   Sulfa Antibiotics Rash    Other reaction(s): Skin Rash Other reaction(s): Skin Rash    Lisinopril Cough    Patient Measurements: Height: 5\' 3"  (160 cm) Weight: 69.7 kg (153 lb 10.6 oz) IBW/kg (Calculated) : 52.4  Labs: Recent Labs    09/04/21 1400 09/06/21 0850 09/06/21 1632  HGB 9.0* 8.7*  --   HCT 27.5* 26.3*  --   PLT 175 115*  --   CREATININE 7.89* 6.74* 3.89*     Estimated Creatinine Clearance: 14.4 mL/min (A) (by C-G formula based on SCr of 3.89 mg/dL (H)).  Assessment: 35 yof who is s/p fall, found to have supracondylar distal femur facture now s/p ORIF. No AC PTA. Of note, pt has hx of ESRD.  Plan for ortho to do apixaban for DVT prophylaxis. Manufacturer doesn't recommend any dose adjustment given ESRD status.   Goal of Therapy:  Monitor platelets by anticoagulation protocol: Yes   Plan:  Continue apixaban 2.5 mg PO BID  Monitor for s/sx of bleeding, CBC  Plan is for education once COVID precautions discontinued Pharmacy signing off but will continue to follow peripherally - please re-consult if needed  Thank you for involving pharmacy in this patient's care.  Renold Genta, PharmD, BCPS Clinical Pharmacist Clinical phone for 09/07/2021 until 3p is [phone] 09/07/2021 10:34 AM

## 2021-09-07 NOTE — Progress Notes (Signed)
Occupational Therapy Treatment Patient Details Name: Kendra Clay MRN: [id number] DOB: 1960-08-10 Today's Date: 09/07/2021   History of present illness Kendra Clay is a 61 y.o. female with past medical history of ESRD on HD MWF, essential hypertension, coronary artery disease status post stent placement in 2017, dilated cardiomyopathy, chronic combined diastolic and systolic CHF, who initially presented to [facility] ED after a ground-level fall while walking up the stairs.  She incurred multiple fractures, to her left supracondylar distal femur fracture, right tibia shaft fracture, left distal radius and ulna fracture.  The patient underwent repair of those fractures on 08/29/21.   OT comments  Pt. Seen for skilled OT treatment session. Bed mobility with min a.  Max/total a for lb peri care. Set up for ub bathing.  Mod/max a for pivotal steps eob to recliner.  Pt. Required max encouragement and redirection for task initiation and completion secondary to limiting comments and behaviors.  Reviewed benefits and importance of engaging in her adls and mobility to continue to gain strength, safety, and independence prior to home.  AIR remains a good d/c plan for her.   Recommendations for follow up therapy are one component of a multi-disciplinary discharge planning process, led by the attending physician.  Recommendations may be updated based on patient status, additional functional criteria and insurance authorization.    Follow Up Recommendations  Acute inpatient rehab (3hours/day)    Assistance Recommended at Discharge Frequent or constant Supervision/Assistance  Patient can return home with the following  Two people to help with bathing/dressing/bathroom;Two people to help with walking and/or transfers;Assistance with cooking/housework;Assist for transportation;Help with stairs or ramp for entrance;Assistance with feeding   Equipment Recommendations  Other (comment)    Recommendations for Other  Services      Precautions / Restrictions Precautions Precautions: Fall Precaution Comments: airborne/contact: COVID + Other Brace: camboot on RLE with transfers/mobility only Restrictions LUE Weight Bearing: Weight bear through elbow only RLE Weight Bearing: Weight bearing as tolerated LLE Weight Bearing: Weight bearing as tolerated       Mobility Bed Mobility Overal bed mobility: Needs Assistance Bed Mobility: Supine to Sit     Supine to sit: Min assist     General bed mobility comments: physical assistance for long sitting, mina a for guiding bles out of bed    Transfers Overall transfer level: Needs assistance Equipment used: Left platform walker Transfers: Sit to/from Stand, Bed to chair/wheelchair/BSC Sit to Stand: Mod assist, From elevated surface     Step pivot transfers: Mod assist, Max assist     General transfer comment: max cues for posture and to iniate movement of RLE mid transfer encouraged stepping back one more time pt. states "i cant but look i made it to sit down anyway" and had a semi controlled sit not fully squared up to recliner     Balance                                           ADL either performed or assessed with clinical judgement   ADL Overall ADL's : Needs assistance/impaired     Grooming: Wash/dry face;Applying deodorant;Set up;Sitting   Upper Body Bathing: Set up;Sitting;Cueing for compensatory techniques Upper Body Bathing Details (indicate cue type and reason): educated on how to wash her R arm pit one handed tech. Lower Body Bathing: Maximal assistance;Total assistance;Sit to/from stand Lower Body Bathing  Details (indicate cue type and reason): pt. in standing with platform walker encouraged to attempt cleaning her peri areas "i cant" Upper Body Dressing : Minimal assistance;Sitting   Lower Body Dressing: Maximal assistance;Sitting/lateral leans Lower Body Dressing Details (indicate cue type and reason):  education for pt. to assist as able with cam boot. assisted into long sitting with great resistance, once in longsitting assisted with velcro and encouraged her she states the velcro "is too strong and my son is just [place] do this for me anyway" reviewed importance of her efforts prior to assistance so she can continue with strengthening and increasing independence Toilet Transfer: Maximal assistance;Cueing for safety;Cueing for sequencing Toilet Transfer Details (indicate cue type and reason): PFRW- simulated with pivotal steps from eob to recliner         Functional mobility during ADLs: Maximal assistance General ADL Comments: pt. requires max encouragement and redirection for attempts at participation and completion of tasks.  leads with "no" or "i cant" but once engaged demonstrates abilties for mobility and adls.  at end of sesion, reviewed all that she had accomplished she states "i knew i can do it i can do a lot of things im just going to complain about it first".  pt. also shared her mother passed away 3 weeks ago on 5w here at [facility]. states she believes that contributed to her fall and accident that brought her here.    Extremity/Trunk Assessment              Vision       Perception     Praxis      Cognition Arousal/Alertness: Awake/alert Behavior During Therapy: WFL for tasks assessed/performed Overall Cognitive Status: Within Functional Limits for tasks assessed                                          Exercises      Shoulder Instructions       General Comments  Pt. Shared recent loss of her mother here at [facility], reports her son is available to assist at home as needed    Pertinent Vitals/ Pain       Pain Assessment Pain Assessment: Faces Faces Pain Scale: Hurts little more Pain Location: R LE Pain Descriptors / Indicators: Aching, Discomfort Pain Intervention(s): Limited activity within patient's tolerance, Monitored during session, Patient  requesting pain meds-RN notified, Repositioned  Home Living                                          Prior Functioning/Environment              Frequency  Min 2X/week        Progress Toward Goals  OT Goals(current goals can now be found in the care plan section)  Progress towards OT goals: Progressing toward goals     Plan Discharge plan remains appropriate;Frequency remains appropriate    Co-evaluation                 AM-PAC OT "6 Clicks" Daily Activity     Outcome Measure   Help from another person eating meals?: A Little Help from another person taking care of personal grooming?: A Little Help from another person toileting, which includes using toliet, bedpan, or urinal?: Total Help from another  person bathing (including washing, rinsing, drying)?: A Lot Help from another person to put on and taking off regular upper body clothing?: A Lot Help from another person to put on and taking off regular lower body clothing?: Total 6 Click Score: 12    End of Session Equipment Utilized During Treatment: Gait belt;Other (comment) (LPFRW)  OT Visit Diagnosis: Unsteadiness on feet (R26.81);Other abnormalities of gait and mobility (R26.89);Muscle weakness (generalized) (M62.81);Pain Pain - Right/Left: Left   Activity Tolerance Patient tolerated treatment well   Patient Left in chair;with call bell/phone within reach   Nurse Communication Mobility status;Patient requests pain meds        Time: 6431-4276 OT Time Calculation (min): 28 min  Charges: OT General Charges $OT Visit: 1 Visit OT Treatments $Self Care/Home Management : 23-37 mins  Sonia Baller, COTA/L Acute Rehabilitation [phone]   Tanya Nones 09/07/2021, 12:27 PM

## 2021-09-07 NOTE — Progress Notes (Addendum)
[facility] Progress Note   Subjective:  Seen in room - no new issues, still with chest congestion related to COVID and requesting a mucinex -> will pass msg to her RN. Did fine with dialysis yesterday, net UF 1.8L.  Objective Vitals:   09/06/21 1200 09/06/21 1209 09/06/21 2043 09/07/21 0546  BP: (!) 91/58 119/71 122/89 (!) 158/67  Pulse:   94 83  Resp:   18   Temp:  97.7 F (36.5 C) 99 F (37.2 C) 98.7 F (37.1 C)  TempSrc:  Oral Oral Oral  SpO2: 100% 100% 97% 96%  Weight:      Height:       Physical Exam General: Well appearing woman, NAD. Room air Heart: RRR; no murmur Lungs: CTAB, no rales Abdomen: soft Extremities: No LE edema; RLE with bruising and 2 sutured incisions. L forearm bandaged Dialysis Access: RUE AVF + thrill  Additional Objective Labs: Basic Metabolic Panel: Recent Labs  Lab 09/02/21 0141 09/03/21 0604 09/04/21 1400 09/06/21 0850 09/06/21 1632  NA 126*   < > 126* 128* 134*  K 4.6   < > 3.8 4.2 4.2  CL 88*   < > 87* 91* 92*  CO2 25   < > 25 27 31   GLUCOSE 130*   < > 94 93 101*  BUN 48*   < > 57* 41* 22*  CREATININE 8.30*   < > 7.89* 6.74* 3.89*  CALCIUM 7.5*   < > 7.5* 7.9* 8.5*  PHOS 4.8*  --   --  3.3  --    < > = values in this interval not displayed.   Liver Function Tests: Recent Labs  Lab 09/06/21 0850  ALBUMIN 2.2*   CBC: Recent Labs  Lab 08/31/21 2157 09/03/21 0604 09/04/21 1400 09/06/21 0850  WBC 6.3 6.9 6.9 5.4  HGB 9.7* 9.7* 9.0* 8.7*  HCT 29.2* 29.4* 27.5* 26.3*  MCV 87.7 87.2 86.8 86.8  PLT 100* 161 175 115*   Medications:  sodium chloride     sodium chloride 10 mL/hr at 08/29/21 2200   methocarbamol (ROBAXIN) IV      (feeding supplement) PROSource Plus  30 mL Oral BID BM   apixaban  2.5 mg Oral BID   atorvastatin  40 mg Oral QHS   Chlorhexidine Gluconate Cloth  6 each Topical Q0600   [START ON 09/09/2021] darbepoetin (ARANESP) injection - DIALYSIS  150 mcg Intravenous Q Mon-HD   docusate  sodium  100 mg Oral BID   doxercalciferol  8 mcg Intravenous Q M,W,F-HD   ferric citrate  210 mg Oral TID WC   guaiFENesin  1,200 mg Oral BID   pantoprazole  40 mg Oral BID   [START ON 09/08/2021] Vitamin D (Ergocalciferol)  50,000 Units Oral Q7 days    Dialysis Orders: [facility] MWF  3h 79min  350/1.5   60.5kg  2/2 bath  P2  Hep 1200  RFA AVF - last Hb 9.5 on 7/26 - doxercalciferol 73mcg IV q HD - Mircera 38mcg IV q 4wks, last 7/24   Assessment/Plan: Fall w/ traumatic extremity Fx: S/p ORIF L distal femur Fx, ORIF L distal radius/ulnar Fx, IM nailing of R open tibial Fx on 08/29/21.  Per ortho. On Eliquis for DVT prophylaxis. To CIR once off COVID precautions (8/7). COVID: Hypoxic on admit. Finished course of molnupiravir + steroids. ESRD: Continue HD on MWF schedule - next 8/7. HTN/volume: BP stable - prior OP meds on hold (losartan, isosorbide, coreg). Uses midodrine only  prn with HD - will adjust orders. Weight much higher but does not appear overloaded, ? from metal plates in extremities. Anemia of ESRD: Hgb 8.7. ESA due on 8/7 - ordered. S/p 2U PRBCs on 7/27. Follow. Secondary hyperparathyroidism: CorrCa/Phos good. Continue Auryxia as binder + VDRA. Nutrition: Alb low, adding supplements for wound healing. CAD/HFrEF Dispo: To CIR next week  Kendra Penton, PA-C 09/07/2021, 9:56 AM  [facility]  Nephrology attending: I have personally seen and examined the patient.  Chart reviewed.  I agree with above.    Tolerating dialysis well, 1.8 L ultrafiltration yesterday.  No clinical symptoms.  Probably inpatient rehab early next week.  Next HD on Monday.  Kendra Clay, [facility].

## 2021-09-07 NOTE — Progress Notes (Signed)
PROGRESS NOTE    Kendra Clay  YFV:[id number] DOB: 1960-09-19 DOA: 08/29/2021 PCP: Patient, [name]   Brief Narrative: Kendra Clay is a 61 y.o. female with a history of ESRD on HD, CAD s/p stent placement, chronic combined heart failure. Patient presented after a fall, suffering multiple fractures. Patient was managed by trauma surgery on admission. During admission, she was found to be COVID-19 positive and started on an antiviral and steroids for treatment. Plan for acute inpatient rehab on discharge.   Assessment and Plan:  Left supracondylar distal femur fracture Right open tibial shaft fracture Left distal radius and ulnar fracture Secondary to fall at home. Orthopedic surgery consulted and performed ORIF of left distal femur fracture, IM nailing of right tibial shaft fracture, ORIF of left distal radius fracture, percutaneous fixation of left distal ulna fracture and I&D of right open tibial fracture. Recommendation for Eliquis for VTE prophylaxis per orthopedic surgery. Plan for discharge to CIR once off of airborne isolation.  ESRD on HD Nephrology consult. Patient received hemodialysis on a MWF schedule. Patient is receiving bedside HD while on airborne precautions for COVID-19 infection.  Acute on chronic hyponatremia Labile. Management via hemodialysis. Asymptomatic.  COVID-19 infection Associated hypoxia without evidence of pneumonia. Patient treated with molnupiravir and prednisone. Currently asymptomatic. Completed molnupiravir and prednisone. -Flutter valve -Mucinex  Vitamin D deficiency Hectorol with HD per nephrology.  Chronic heart failure with reduced EF Stable. Volume management with HD.  CAD History of stent placement. No chest pain currently. Not previously on antiplatelet therapy. -Continue Lipitor  Primary hypertension Patient is on losartan, Imdur and Coreg as an outpatient which are currently held. Blood pressure is well controlled  currently off home antihypertensives in setting of hemodialysis.  Acute on chronic anemia Anemia of chronic disease Acute anemia secondary to acute fractures and surgical management.  Pancytopenia Transient and resolved. Leukocytopenia and thrombocytopenia now resolved.  DVT prophylaxis: Eliquis Code Status:   Code Status: Full Code Family Communication: None at bedside Disposition Plan: Discharge to acute inpatient rehabilitation once airborne precautions are lifted. Anticipate medical readiness for discharge on 8/7 morning.   Consultants:  Orthopedic surgery  Procedures:  Open reduction internal fixation of left distal femur Intramedullary nailing of right tibial shaft fracture Open reduction internal fixation of left distal radius fracture Percutaneous fixation of left distal ulna fracture Irrigation and debridement of right open tibia fracture  Antimicrobials: Vancomycin Cefazolin Molunpiravir    Subjective: Patient is now able to cough up her phlegm. No other issues.  Objective: BP (!) 158/67 (BP Location: Left Arm)   Pulse 83   Temp 98.7 F (37.1 C) (Oral)   Resp 18   Ht 5\' 3"  (1.6 m)   Wt 69.7 kg   SpO2 96%   BMI 27.22 kg/m   Examination:  General exam: Appears calm and comfortable Respiratory system: Respiratory effort normal. Gastrointestinal system: Abdomen is non-distended. Central nervous system: Alert and oriented. No focal neurological deficits. Psychiatry: Judgement and insight appear normal. Mood & affect appropriate.   Data Reviewed: I have personally reviewed following labs and imaging studies  CBC Lab Results  Component Value Date   WBC 5.4 09/06/2021   RBC 3.03 (L) 09/06/2021   HGB 8.7 (L) 09/06/2021   HCT 26.3 (L) 09/06/2021   MCV 86.8 09/06/2021   MCH 28.7 09/06/2021   PLT 115 (L) 09/06/2021   MCHC 33.1 09/06/2021   RDW 14.7 09/06/2021   LYMPHSABS 0.6 (L) 08/30/2021   MONOABS 0.2  08/30/2021   EOSABS 0.0 08/30/2021   BASOSABS  0.0 26/33/3545     Last metabolic panel Lab Results  Component Value Date   NA 134 (L) 09/06/2021   K 4.2 09/06/2021   CL 92 (L) 09/06/2021   CO2 31 09/06/2021   BUN 22 (H) 09/06/2021   CREATININE 3.89 (H) 09/06/2021   GLUCOSE 101 (H) 09/06/2021   GFRNONAA 13 (L) 09/06/2021   GFRAA 8 (L) 05/20/2018   CALCIUM 8.5 (L) 09/06/2021   PHOS 3.3 09/06/2021   PROT 6.3 (L) 08/30/2021   ALBUMIN 2.2 (L) 09/06/2021   BILITOT 0.6 08/30/2021   ALKPHOS 153 (H) 08/30/2021   AST 23 08/30/2021   ALT 11 08/30/2021   ANIONGAP 11 09/06/2021    GFR: Estimated Creatinine Clearance: 14.4 mL/min (A) (by C-G formula based on SCr of 3.89 mg/dL (H)).  Recent Results (from the past 240 hour(s))  Surgical pcr screen     Status: None   Collection Time: 08/29/21  2:38 PM   Specimen: Nasal Mucosa; Nasal Swab  Result Value Ref Range Status   MRSA, PCR NEGATIVE NEGATIVE Final   Staphylococcus aureus NEGATIVE NEGATIVE Final    Comment: (NOTE) The Xpert SA Assay (FDA approved for NASAL specimens in patients 33 years of age and older), is one component of a comprehensive surveillance program. It is not intended to diagnose infection nor to guide or monitor treatment. Performed at [facility], [place] [address] [place], [place] [address]   Resp Panel by RT-PCR (Flu A&B, Covid) Anterior Nasal Swab     Status: Abnormal   Collection Time: 08/30/21  6:37 AM   Specimen: Anterior Nasal Swab  Result Value Ref Range Status   SARS Coronavirus 2 by RT PCR POSITIVE (A) NEGATIVE Final    Comment: (NOTE) SARS-CoV-2 target nucleic acids are DETECTED.  The SARS-CoV-2 RNA is generally detectable in upper respiratory specimens during the acute phase of infection. Positive results are indicative of the presence of the identified virus, but do not rule out bacterial infection or co-infection with other pathogens not detected by the test. Clinical correlation with patient history and other diagnostic information  is necessary to determine patient infection status. The expected result is Negative.  Fact Sheet for Patients: EntrepreneurPulse.com.au  Fact Sheet for Healthcare Providers: IncredibleEmployment.be  This test is not yet approved or cleared by the [place] FDA and  has been authorized for detection and/or diagnosis of SARS-CoV-2 by FDA under an Emergency Use Authorization (EUA).  This EUA will remain in effect (meaning this test can be used) for the duration of  the COVID-19 declaration under Section 564(b)(1) of the A ct, 21 U.S.C. section 360bbb-3(b)(1), unless the authorization is terminated or revoked sooner.     Influenza A by PCR NEGATIVE NEGATIVE Final   Influenza B by PCR NEGATIVE NEGATIVE Final    Comment: (NOTE) The Xpert Xpress SARS-CoV-2/FLU/RSV plus assay is intended as an aid in the diagnosis of influenza from Nasopharyngeal swab specimens and should not be used as a sole basis for treatment. Nasal washings and aspirates are unacceptable for Xpert Xpress SARS-CoV-2/FLU/RSV testing.  Fact Sheet for Patients: EntrepreneurPulse.com.au  Fact Sheet for Healthcare Providers: IncredibleEmployment.be  This test is not yet approved or cleared by the [place] FDA and has been authorized for detection and/or diagnosis of SARS-CoV-2 by FDA under an Emergency Use Authorization (EUA). This EUA will remain in effect (meaning this test can be used) for the duration of the COVID-19 declaration  under Section 564(b)(1) of the Act, 21 U.S.C. section 360bbb-3(b)(1), unless the authorization is terminated or revoked.  Performed at [facility], [place] [address], [place], [place] [address]       Radiology Studies: No results found.    LOS: 9 days    Kendra Poche, MD [facility] 09/07/2021, 9:15 AM   If 7PM-7AM, please contact night-coverage [email or web address]

## 2021-09-07 NOTE — Progress Notes (Signed)
Inpatient Rehab Admissions Coordinator:    I spoke with pt. Regarding potential CIR admit. She is interested, reports he son can provide 24/7 support at d/c. I will follow up with her on Monday once off COVID precautions for potential admit.  Clemens Catholic, [place], [place] Admissions Coordinator  [phone] ([place]) [phone] (office)

## 2021-09-08 DIAGNOSIS — I5022 Chronic systolic (congestive) heart failure: Secondary | ICD-10-CM | POA: Diagnosis not present

## 2021-09-08 DIAGNOSIS — N186 End stage renal disease: Secondary | ICD-10-CM | POA: Diagnosis not present

## 2021-09-08 DIAGNOSIS — S82201B Unspecified fracture of shaft of right tibia, initial encounter for open fracture type I or II: Secondary | ICD-10-CM | POA: Diagnosis not present

## 2021-09-08 DIAGNOSIS — I10 Essential (primary) hypertension: Secondary | ICD-10-CM | POA: Diagnosis not present

## 2021-09-08 NOTE — Progress Notes (Addendum)
Kendra Clay Progress Note   Subjective:  Seen in room. Chest congestion is only symptom. No dyspnea/CP. COVID precuations end tomorrow and plan is for CIR transfer. Will be due for HD tomorrow as well.  Objective Vitals:   09/06/21 2043 09/07/21 0546 09/07/21 2039 09/08/21 0444  BP: 122/89 (!) 158/67 132/67 135/75  Pulse: 94 83 98 92  Resp: 18  16 16   Temp: 99 F (37.2 C) 98.7 F (37.1 C) 99 F (37.2 C) 98.6 F (37 C)  TempSrc: Oral Oral Oral Oral  SpO2: 97% 96% 95% 95%  Weight:      Height:       Physical Exam General: Well appearing woman, NAD. Room air Heart: RRR; no murmur Lungs: CTAB, no rales Abdomen: soft Extremities: No LE edema; RLE with bruising and 2 sutured incisions. L forearm bandaged Dialysis Access: RUE AVF + thrill    Additional Objective Labs: Basic Metabolic Panel: Recent Labs  Lab 09/02/21 0141 09/03/21 0604 09/04/21 1400 09/06/21 0850 09/06/21 1632  NA 126*   < > 126* 128* 134*  K 4.6   < > 3.8 4.2 4.2  CL 88*   < > 87* 91* 92*  CO2 25   < > 25 27 31   GLUCOSE 130*   < > 94 93 101*  BUN 48*   < > 57* 41* 22*  CREATININE 8.30*   < > 7.89* 6.74* 3.89*  CALCIUM 7.5*   < > 7.5* 7.9* 8.5*  PHOS 4.8*  --   --  3.3  --    < > = values in this interval not displayed.   Liver Function Tests: Recent Labs  Lab 09/06/21 0850  ALBUMIN 2.2*   CBC: Recent Labs  Lab 09/03/21 0604 09/04/21 1400 09/06/21 0850  WBC 6.9 6.9 5.4  HGB 9.7* 9.0* 8.7*  HCT 29.4* 27.5* 26.3*  MCV 87.2 86.8 86.8  PLT 161 175 115*   Medications:  sodium chloride 10 mL/hr at 08/29/21 2200   methocarbamol (ROBAXIN) IV      (feeding supplement) PROSource Plus  30 mL Oral BID BM   apixaban  2.5 mg Oral BID   atorvastatin  40 mg Oral QHS   Chlorhexidine Gluconate Cloth  6 each Topical Q0600   [START ON 09/09/2021] darbepoetin (ARANESP) injection - DIALYSIS  150 mcg Intravenous Q Mon-HD   docusate sodium  100 mg Oral BID   doxercalciferol  8 mcg  Intravenous Q M,W,F-HD   ferric citrate  210 mg Oral TID WC   guaiFENesin  1,200 mg Oral BID   pantoprazole  40 mg Oral BID   Vitamin D (Ergocalciferol)  50,000 Units Oral Q7 days    Dialysis Orders: [facility] MWF  3h 52min  350/1.5   60.5kg  2/2 bath  P2  Hep 1200  RFA AVF - last Hb 9.5 on 7/26 - doxercalciferol 32mcg IV q HD - Mircera 22mcg IV q 4wks, last 7/24   Assessment/Plan: Fall w/ traumatic extremity Fx: S/p ORIF L distal femur Fx, ORIF L distal radius/ulnar Fx, IM nailing of R open tibial Fx on 08/29/21.  Per ortho. On Eliquis for DVT prophylaxis. To CIR once off COVID precautions (8/7). COVID: Hypoxic on admit. Finished course of molnupiravir + steroids. ESRD: Continue HD on MWF schedule - next 8/7. HTN/volume: BP stable - prior OP meds on hold (losartan, isosorbide, coreg). Uses midodrine only prn with HD - will adjust orders. Weight much higher but does not appear overloaded, ? from  metal plates in extremities. Anemia of ESRD: Hgb 8.7. ESA due on 8/7 - ordered. S/p 2U PRBCs on 7/27. Follow. Secondary hyperparathyroidism: CorrCa/Phos good. Continue Auryxia as binder + VDRA. Nutrition: Alb low, adding supplements for wound healing. CAD/HFrEF Dispo: To CIR next week    [name], PA-C 09/08/2021, 10:23 AM  [facility]  Nephrology attending: I have personally seen and examined the patient.  Chart reviewed.  I agree with above. ESRD on HD, tolerating dialysis well.  Plan for next HD tomorrow.  Completing COVID isolation today and possibly she will go to CIR tomorrow.  Katheran James, [place] [facility].

## 2021-09-08 NOTE — Progress Notes (Signed)
PROGRESS NOTE    Kendra Clay  WNI:[id number] DOB: 1960/10/04 DOA: 08/29/2021 PCP: Patient, [name]   Brief Narrative: Kendra Clay is a 61 y.o. female with a history of ESRD on HD, CAD s/p stent placement, chronic combined heart failure. Patient presented after a fall, suffering multiple fractures. Patient was managed by trauma surgery on admission. During admission, she was found to be COVID-19 positive and started on an antiviral and steroids for treatment. Plan for acute inpatient rehab on discharge.   Assessment and Plan:  Left supracondylar distal femur fracture Right open tibial shaft fracture Left distal radius and ulnar fracture Secondary to fall at home. Orthopedic surgery consulted and performed ORIF of left distal femur fracture, IM nailing of right tibial shaft fracture, ORIF of left distal radius fracture, percutaneous fixation of left distal ulna fracture and I&D of right open tibial fracture. Recommendation for Eliquis for VTE prophylaxis per orthopedic surgery. Plan for discharge to CIR once off of airborne isolation.  ESRD on HD Nephrology consult. Patient received hemodialysis on a MWF schedule. Patient is receiving bedside HD while on airborne precautions for COVID-19 infection.  Acute on chronic hyponatremia Labile. Management via hemodialysis. Asymptomatic.  COVID-19 infection Associated hypoxia without evidence of pneumonia. Patient treated with molnupiravir and prednisone. Currently asymptomatic except for some phlegm production. Completed molnupiravir and prednisone. -Flutter valve -Mucinex  Vitamin D deficiency Hectorol with HD per nephrology.  Chronic heart failure with reduced EF Stable. Volume management with HD.  CAD History of stent placement. No chest pain currently. Not previously on antiplatelet therapy. -Continue Lipitor  Primary hypertension Patient is on losartan, Imdur and Coreg as an outpatient which are currently held. Blood  pressure is well controlled currently off home antihypertensives in setting of hemodialysis.  Acute on chronic anemia Anemia of chronic disease Acute anemia secondary to acute fractures and surgical management.  Pancytopenia Transient and resolved. Leukocytopenia and thrombocytopenia now resolved.  DVT prophylaxis: Eliquis Code Status:   Code Status: Full Code Family Communication: None at bedside Disposition Plan: Discharge to acute inpatient rehabilitation once airborne precautions are lifted. Anticipate medical readiness for discharge on 8/7 morning.   Consultants:  Orthopedic surgery  Procedures:  Open reduction internal fixation of left distal femur Intramedullary nailing of right tibial shaft fracture Open reduction internal fixation of left distal radius fracture Percutaneous fixation of left distal ulna fracture Irrigation and debridement of right open tibia fracture  Antimicrobials: Vancomycin Cefazolin Molunpiravir    Subjective: Phlegm production has been easier since starting flutter valve and Mucinex. No other concerns.  Objective: BP 135/75 (BP Location: Left Arm)   Pulse 92   Temp 98.6 F (37 C) (Oral)   Resp 16   Ht 5\' 3"  (1.6 m)   Wt 69.7 kg   SpO2 95%   BMI 27.22 kg/m   Examination:  General exam: Appears calm and comfortable Respiratory system: Respiratory effort normal. Central nervous system: Alert and oriented. No focal neurological deficits. Psychiatry: Judgement and insight appear normal. Mood & affect appropriate.   Data Reviewed: I have personally reviewed following labs and imaging studies  CBC Lab Results  Component Value Date   WBC 5.4 09/06/2021   RBC 3.03 (L) 09/06/2021   HGB 8.7 (L) 09/06/2021   HCT 26.3 (L) 09/06/2021   MCV 86.8 09/06/2021   MCH 28.7 09/06/2021   PLT 115 (L) 09/06/2021   MCHC 33.1 09/06/2021   RDW 14.7 09/06/2021   LYMPHSABS 0.6 (L) 08/30/2021   MONOABS  0.2 08/30/2021   EOSABS 0.0 08/30/2021    BASOSABS 0.0 73/42/8768     Last metabolic panel Lab Results  Component Value Date   NA 134 (L) 09/06/2021   K 4.2 09/06/2021   CL 92 (L) 09/06/2021   CO2 31 09/06/2021   BUN 22 (H) 09/06/2021   CREATININE 3.89 (H) 09/06/2021   GLUCOSE 101 (H) 09/06/2021   GFRNONAA 13 (L) 09/06/2021   GFRAA 8 (L) 05/20/2018   CALCIUM 8.5 (L) 09/06/2021   PHOS 3.3 09/06/2021   PROT 6.3 (L) 08/30/2021   ALBUMIN 2.2 (L) 09/06/2021   BILITOT 0.6 08/30/2021   ALKPHOS 153 (H) 08/30/2021   AST 23 08/30/2021   ALT 11 08/30/2021   ANIONGAP 11 09/06/2021    GFR: Estimated Creatinine Clearance: 14.4 mL/min (A) (by C-G formula based on SCr of 3.89 mg/dL (H)).  Recent Results (from the past 240 hour(s))  Surgical pcr screen     Status: None   Collection Time: 08/29/21  2:38 PM   Specimen: Nasal Mucosa; Nasal Swab  Result Value Ref Range Status   MRSA, PCR NEGATIVE NEGATIVE Final   Staphylococcus aureus NEGATIVE NEGATIVE Final    Comment: (NOTE) The Xpert SA Assay (FDA approved for NASAL specimens in patients 34 years of age and older), is one component of a comprehensive surveillance program. It is not intended to diagnose infection nor to guide or monitor treatment. Performed at [facility], [place] [address] [place], [place] [address]   Resp Panel by RT-PCR (Flu A&B, Covid) Anterior Nasal Swab     Status: Abnormal   Collection Time: 08/30/21  6:37 AM   Specimen: Anterior Nasal Swab  Result Value Ref Range Status   SARS Coronavirus 2 by RT PCR POSITIVE (A) NEGATIVE Final    Comment: (NOTE) SARS-CoV-2 target nucleic acids are DETECTED.  The SARS-CoV-2 RNA is generally detectable in upper respiratory specimens during the acute phase of infection. Positive results are indicative of the presence of the identified virus, but do not rule out bacterial infection or co-infection with other pathogens not detected by the test. Clinical correlation with patient history and other diagnostic  information is necessary to determine patient infection status. The expected result is Negative.  Fact Sheet for Patients: EntrepreneurPulse.com.au  Fact Sheet for Healthcare Providers: IncredibleEmployment.be  This test is not yet approved or cleared by the [place] FDA and  has been authorized for detection and/or diagnosis of SARS-CoV-2 by FDA under an Emergency Use Authorization (EUA).  This EUA will remain in effect (meaning this test can be used) for the duration of  the COVID-19 declaration under Section 564(b)(1) of the A ct, 21 U.S.C. section 360bbb-3(b)(1), unless the authorization is terminated or revoked sooner.     Influenza A by PCR NEGATIVE NEGATIVE Final   Influenza B by PCR NEGATIVE NEGATIVE Final    Comment: (NOTE) The Xpert Xpress SARS-CoV-2/FLU/RSV plus assay is intended as an aid in the diagnosis of influenza from Nasopharyngeal swab specimens and should not be used as a sole basis for treatment. Nasal washings and aspirates are unacceptable for Xpert Xpress SARS-CoV-2/FLU/RSV testing.  Fact Sheet for Patients: EntrepreneurPulse.com.au  Fact Sheet for Healthcare Providers: IncredibleEmployment.be  This test is not yet approved or cleared by the [place] FDA and has been authorized for detection and/or diagnosis of SARS-CoV-2 by FDA under an Emergency Use Authorization (EUA). This EUA will remain in effect (meaning this test can be used) for the duration of the COVID-19  declaration under Section 564(b)(1) of the Act, 21 U.S.C. section 360bbb-3(b)(1), unless the authorization is terminated or revoked.  Performed at [facility], [place] [address], [place], [place] [address]       Radiology Studies: No results found.    LOS: 10 days    Cordelia Poche, MD [facility] 09/08/2021, 12:14 PM   If 7PM-7AM, please contact night-coverage [email or web address]

## 2021-09-08 NOTE — PMR Pre-admission (Signed)
PMR Admission Coordinator Pre-Admission Assessment  Patient: Kendra Clay is an 61 y.o., female MRN: [id number] DOB: Sep 28, 1960 Height: _0  (160 cm) Weight: 69.7 kg  Insurance Information HMO:     PPO:      PCP:      IPA:      80/20: yes     OTHER:  PRIMARY: Medicare A and B      Policy#:   [id number]    Subscriber: Pt. Phone#: Verified online    Fax#:  Pre-Cert#:       Employer:  Benefits:  Phone #:      Name:  Eff. Date: Parts A 08/03/12 and B effective 08/04/14 Deduct: $1600      Out of Pocket Max:  None      Life Max: N/A  CIR: 100%      SNF: 100 days Outpatient: 80%     Co-Pay: 20% Home Health: 100%      Co-Pay: none DME: 80%     Co-Pay: 20% Providers: patient's choice SECONDARY: A[employer]      Policy#: [id number]     Phone#:   Financial Counselor:      Phone#:   The "Data Collection Information Summary" for patients in Inpatient Rehabilitation Facilities with attached "Privacy Act [place] Records" was provided and verbally reviewed with: Patient  Emergency Contact Information Contact Information     Name Relation Home Work Mobile   [place] Son   [phone]       Current Medical History  Patient Admitting Diagnosis: Critical Illness Myopathy History of Present Illness: Kendra Clay is a 61 y.o. female with past medical history of ESRD on HD MWF, essential hypertension, coronary artery disease status post stent placement in 2017, dilated cardiomyopathy, chronic combined diastolic and systolic CHF, who initially presented to [facility] ED7/[facility] after a ground-level fall while walking up the stairs.  She incurred multiple fractures, to her left supracondylar distal femur fracture, right tibia shaft fracture, left distal radius and ulna fracture.  The patient underwent ORIF of L femur fracture and L wrist fracture and IM nail with I&D of R tibia with ortho on 08/29/21. Pt. Also tested positive for COVID 08/29/21 and was placed on 10 day airborne/contact precautions.  Pt. With associated hypoxia without evidence of pneumonia. Patient treated with molnupiravir and prednisone. Currently asymptomatic. Completed molnupiravir and prednisone. Pt. Seen by PT/OT and they recommended CIR to assist return to PLOF.     Patient's medical record from [facility] has been reviewed by the rehabilitation admission coordinator and physician.  Past Medical History  Past Medical History:  Diagnosis Date   Complication of anesthesia    Enlarged liver    secondary to PKD   ESRD on hemodialysis ([place])    MWF [place]   Heart murmur    "slight" per ? Dr Deatra Ina 30 years ago. 2D ECHO  30 yearsa go.   History of blood transfusion    C- Section   History of bronchitis    numerous, last time> 1 year   Hypertension    Night muscle spasms    legs   Polycystic kidney disease    Genetic dx 23 years ago   PONV (postoperative nausea and vomiting)    patch helped   Scoliosis    Strabismus     Has the patient had major surgery during 100 days prior to admission? Yes  Family History   family history includes Arthritis in her mother; Asthma in her  son; Hypertension in her maternal grandmother; Kidney disease in her father; Polycystic kidney disease in her son.  Current Medications  Current Facility-Administered Medications:    (feeding supplement) PROSource Plus liquid 30 mL, 30 mL, Oral, BID BM, Stovall, Kathryn R, PA-C, 30 mL at 09/07/21 1500   0.9 %  sodium chloride infusion, , Intravenous, Continuous, Corinne Ports, PA-C, Last Rate: 10 mL/hr at 08/29/21 2200, New Bag at 08/29/21 2200   acetaminophen (TYLENOL) tablet 325-650 mg, 325-650 mg, Oral, Q6H PRN, Corinne Ports, PA-C   apixaban (ELIQUIS) tablet 2.5 mg, 2.5 mg, Oral, BID, Rai, Ripudeep K, MD, 2.5 mg at 09/07/21 2131   atorvastatin (LIPITOR) tablet 40 mg, 40 mg, Oral, QHS, Rai, Ripudeep K, MD, 40 mg at 09/07/21 2131   Chlorhexidine Gluconate Cloth 2 % PADS 6 each, 6 each, Topical, Q0600,  Penninger, Ria Comment, PA, 6 each at 09/07/21 1337   [START ON 09/09/2021] Darbepoetin Alfa (ARANESP) injection 150 mcg, 150 mcg, Intravenous, Q Mon-HD, Stovall, Kathryn R, PA-C   diphenhydrAMINE (BENADRYL) 12.5 MG/5ML elixir 12.5-25 mg, 12.5-25 mg, Oral, Q4H PRN, McClung, Sarah A, PA-C   docusate sodium (COLACE) capsule 100 mg, 100 mg, Oral, BID, Corinne Ports, PA-C, 100 mg at 09/07/21 2131   doxercalciferol (HECTOROL) injection 8 mcg, 8 mcg, Intravenous, Q M,W,F-HD, Penninger, Lindsay, PA, 8 mcg at 09/06/21 0946   ferric citrate (AURYXIA) tablet 210 mg, 210 mg, Oral, TID WC, Rai, Ripudeep K, MD, 210 mg at 09/08/21 0816   guaiFENesin (MUCINEX) 12 hr tablet 1,200 mg, 1,200 mg, Oral, BID, Mariel Aloe, MD, 1,200 mg at 09/07/21 2131   hydrALAZINE (APRESOLINE) tablet 10 mg, 10 mg, Oral, Q6H PRN, Corinne Ports, PA-C   HYDROmorphone (DILAUDID) injection 0.5-1 mg, 0.5-1 mg, Intravenous, Q4H PRN, Corinne Ports, PA-C, 1 mg at 09/03/21 1514   menthol-cetylpyridinium (CEPACOL) lozenge 3 mg, 1 lozenge, Oral, PRN, Rai, Ripudeep K, MD   methocarbamol (ROBAXIN) tablet 500 mg, 500 mg, Oral, Q6H PRN, 500 mg at 09/03/21 0859 **OR** methocarbamol (ROBAXIN) 500 mg in dextrose 5 % 50 mL IVPB, 500 mg, Intravenous, Q6H PRN, McClung, Sarah A, PA-C   metoCLOPramide (REGLAN) tablet 5-10 mg, 5-10 mg, Oral, Q8H PRN **OR** metoCLOPramide (REGLAN) injection 5-10 mg, 5-10 mg, Intravenous, Q8H PRN, McClung, Sarah A, PA-C   midodrine (PROAMATINE) tablet 5 mg, 5 mg, Oral, PRN, Stovall, Kathryn R, PA-C   ondansetron (ZOFRAN) tablet 4 mg, 4 mg, Oral, Q6H PRN **OR** ondansetron (ZOFRAN) injection 4 mg, 4 mg, Intravenous, Q6H PRN, McClung, Sarah A, PA-C, 4 mg at 09/03/21 1823   oxyCODONE (Oxy IR/ROXICODONE) immediate release tablet 5-10 mg, 5-10 mg, Oral, Q4H PRN, Corinne Ports, PA-C, 5 mg at 09/07/21 2131   pantoprazole (PROTONIX) EC tablet 40 mg, 40 mg, Oral, BID, Rai, Ripudeep K, MD, 40 mg at 09/07/21 2132   polyethylene  glycol (MIRALAX / GLYCOLAX) packet 17 g, 17 g, Oral, Daily PRN, McClung, Sarah A, PA-C   Vitamin D (Ergocalciferol) (DRISDOL) 1.25 MG (50000 UNIT) capsule 50,000 Units, 50,000 Units, Oral, Q7 days, Penninger, Ria Comment, PA  Patients Current Diet:  Diet Order             Diet renal with fluid restriction Fluid restriction: 1200 mL Fluid; Room service appropriate? Yes; Fluid consistency: Thin  Diet effective now                   Precautions / Restrictions Precautions Precautions: Fall Precaution Comments: airborne/contact: COVID + Other Brace: camboot on  RLE with transfers/mobility only Restrictions Weight Bearing Restrictions: Yes LUE Weight Bearing: Weight bear through elbow only RLE Weight Bearing: Weight bearing as tolerated LLE Weight Bearing: Weight bearing as tolerated   Has the patient had 2 or more falls or a fall with injury in the past year? No  Prior Activity Level Community (5-7x/wk): Pt active in the community PTA  Prior Functional Level Self Care: Did the patient need help bathing, dressing, using the toilet or eating? Independent  Indoor Mobility: Did the patient need assistance with walking from room to room (with or without device)? Independent  Stairs: Did the patient need assistance with internal or external stairs (with or without device)? Independent  Functional Cognition: Did the patient need help planning regular tasks such as shopping or remembering to take medications? Independent  Patient Information Are you of Hispanic, Latino/a,or Spanish origin?: A. No, not of Hispanic, Latino/a, or Spanish origin What is your race?: A. White Do you need or want an interpreter to communicate with a doctor or health care staff?: 0. No  Patient's Response To:  Health Literacy and Transportation Is the patient able to respond to health literacy and transportation needs?: Yes Health Literacy - How often do you need to have someone help you when you read  instructions, pamphlets, or other written material from your doctor or pharmacy?: Never In the past 12 months, has lack of transportation kept you from medical appointments or from getting medications?: No In the past 12 months, has lack of transportation kept you from meetings, work, or from getting things needed for daily living?: No  Home Assistive Devices / [place] Devices/Equipment: Wende Mott, Environmental consultant (specify type), Cane (specify quad or straight) Home Equipment: Cane - single point, Conservation officer, nature (2 wheels), Rollator (4 wheels), Shower seat - built in  Prior Device Use: Indicate devices/aids used by the patient prior to current illness, exacerbation or injury? None of the above  Current Functional Level Cognition  Overall Cognitive Status: Within Functional Limits for tasks assessed Orientation Level: Oriented X4 General Comments: slight anxiety noted during session when provided with education and multimodel cueing to correct form and/or technique during functional mobility and use of walker.    Extremity Assessment (includes Sensation/Coordination)  Upper Extremity Assessment: LUE deficits/detail, RUE deficits/detail RUE Deficits / Details: generalized weakness LUE Deficits / Details: limited due to casting from hand to 2/3 of forearm LUE Coordination: decreased fine motor, decreased gross motor  Lower Extremity Assessment: RLE deficits/detail, LLE deficits/detail RLE Deficits / Details: AAROM flexion at knee in supine about 30, in sitting about 80, wearing camboot so ankle not assessed, positive quad set RLE: Unable to fully assess due to immobilization LLE Deficits / Details: AAROM limited knee flexion due to pain, in sitting up to about 60, weak quad set and ankle DF/PF noted mildly limited by pain    ADLs  Overall ADL's : Needs assistance/impaired Eating/Feeding: Modified independent, Sitting Grooming: Wash/dry face, Applying deodorant, Set up,  Sitting Upper Body Bathing: Set up, Sitting, Cueing for compensatory techniques Upper Body Bathing Details (indicate cue type and reason): educated on how to wash her R arm pit one handed tech. Lower Body Bathing: Maximal assistance, Total assistance, Sit to/from stand Lower Body Bathing Details (indicate cue type and reason): pt. in standing with platform walker encouraged to attempt cleaning her peri areas "i cant" Upper Body Dressing : Minimal assistance, Sitting Lower Body Dressing: Maximal assistance, Sitting/lateral leans Lower Body Dressing Details (indicate cue type and reason): education  for pt. to assist as able with cam boot. assisted into long sitting with great resistance, once in longsitting assisted with velcro and encouraged her she states the velcro "is too strong and my son is just [place] do this for me anyway" reviewed importance of her efforts prior to assistance so she can continue with strengthening and increasing independence Toilet Transfer: Maximal assistance, Cueing for safety, Cueing for sequencing Toilet Transfer Details (indicate cue type and reason): PFRW- simulated with pivotal steps from eob to [place] and Hygiene: Total assistance Toileting - Clothing Manipulation Details (indicate cue type and reason): Can maintain standing with Mod A Functional mobility during ADLs: Maximal assistance General ADL Comments: pt. requires max encouragement and redirection for attempts at participation and completion of tasks.  leads with "no" or "i cant" but once engaged demonstrates abilties for mobility and adls.  at end of sesion, reviewed all that she had accomplished she states "i knew i can do it i can do a lot of things im just going to complain about it first".  pt. also shared her mother passed away 3 weeks ago on 5w here at [facility]. states she believes that contributed to her fall and accident that brought her here.    Mobility  Overal bed  mobility: Needs Assistance Bed Mobility: Supine to Sit Rolling: Mod assist Supine to sit: Min assist General bed mobility comments: physical assistance for long sitting, mina a for guiding bles out of bed    Transfers  Overall transfer level: Needs assistance Equipment used: Left platform walker Transfers: Sit to/from Stand, Bed to chair/wheelchair/BSC Sit to Stand: Mod assist, From elevated surface Bed to/from chair/wheelchair/BSC transfer type:: Step pivot Squat pivot transfers: Mod assist, +2 physical assistance Step pivot transfers: Mod assist, Max assist General transfer comment: max cues for posture and to iniate movement of RLE mid transfer encouraged stepping back one more time pt. states "i cant but look i made it to sit down anyway" and had a semi controlled sit not fully squared up to recliner    Ambulation / Gait / Stairs / Wheelchair Mobility       Posture / Balance Dynamic Sitting Balance Sitting balance - Comments: sitting on EOB working on scooting towards the EOB. Balance Overall balance assessment: Needs assistance Sitting-balance support: No upper extremity supported, Feet unsupported Sitting balance-Leahy Scale: Fair Sitting balance - Comments: sitting on EOB working on scooting towards the EOB. Standing balance support: Bilateral upper extremity supported, During functional activity, Reliant on assistive device for balance Standing balance-Leahy Scale: Poor Standing balance comment: heavy reliance on platform walker and external assist.    Special needs/care consideration Special service needs none  MWF HD    Previous Home Environment (from acute therapy documentation) Living Arrangements: Children  Lives With: Family Available Help at Discharge: Family, Available 24 hours/day Type of Home: House Home Layout: One level Home Access: Stairs to enter Entrance Stairs-Rails: None Entrance Stairs-Number of Steps: 2 Bathroom Shower/Tub: Print production planner: Standard Home Care Services: No  Discharge Living Setting Plans for Discharge Living Setting: House Type of Home at Discharge: House Discharge Home Layout: One level Discharge Home Access: Stairs to enter Entrance Stairs-Rails: None Entrance Stairs-Number of Steps: 2 Discharge Bathroom Shower/Tub: Tub/shower unit Discharge Bathroom Toilet: Standard Discharge Bathroom Accessibility: Yes How Accessible: Accessible via wheelchair, Accessible via walker Does the patient have any problems obtaining your medications?: No  Social/Family/Support Systems Patient Roles: Other (Comment) Contact Information: [phone] Anticipated Caregiver: Inocente Salles  Shimer (son) Anticipated Caregiver's Contact Information: [phone] Ability/Limitations of Caregiver: 24/7 Caregiver Availability: 24/7 Discharge Plan Discussed with Primary Caregiver: Yes Is Caregiver In Agreement with Plan?: Yes  Goals Patient/Family Goal for Rehab: PT/OT Mod I Expected length of stay: 5-7 days Pt/Family Agrees to Admission and willing to participate: Yes Program Orientation Provided & Reviewed with Pt/Caregiver Including Roles  & Responsibilities: Yes  Decrease burden of Care through IP rehab admission: n/a  Possible need for SNF placement upon discharge: not anticipated  Patient Condition: I have reviewed medical records from [facility], spoken with CM, and patient and family member. I met with patient at the bedside for inpatient rehabilitation assessment.  Patient will benefit from ongoing PT and OT, can actively participate in 3 hours of therapy a day 5 days of the week, and can make measurable gains during the admission.  Patient will also benefit from the coordinated team approach during an Inpatient Acute Rehabilitation admission.  The patient will receive intensive therapy as well as Rehabilitation physician, nursing, social worker, and care management interventions.  Due to safety, disease  management, medication administration, pain management, and patient education the patient requires 24 hour a day rehabilitation nursing.  The patient is currently min A  with mobility and basic ADLs.  Discharge setting and therapy post discharge at home with home health is anticipated.  Patient has agreed to participate in the Acute Inpatient Rehabilitation Program and will admit today.  Preadmission Screen Completed By:  Genella Mech, 09/08/2021 11:44 AM ______________________________________________________________________   Discussed status with Dr. Curlene Dolphin on 09/09/21 at 81 and received approval for admission today.  Admission Coordinator:  Genella Mech, CCC-SLP, time 1030/Date 09/09/21   Assessment/Plan: Diagnosis: Polytrauma after fall with Left supracondylar distal femur fracture, Right open tibial shaft fracture, Left distal radius and ulnar fracture Does the need for close, 24 hr/day Medical supervision in concert with the patient's rehab needs make it unreasonable for this patient to be served in a less intensive setting? Yes Co-Morbidities requiring supervision/potential complications: ESRD on HD< hyponatremia, recent Covid 19, Heart failure with decreased EF, anemia, pancytopenia Due to bladder management, bowel management, safety, skin/wound care, disease management, medication administration, pain management, and patient education, does the patient require 24 hr/day rehab nursing? Yes Does the patient require coordinated care of a physician, rehab nurse, PT, OT, and SLP to address physical and functional deficits in the context of the above medical diagnosis(es)? Yes Addressing deficits in the following areas: balance, endurance, locomotion, strength, transferring, bowel/bladder control, bathing, dressing, feeding, grooming, and toileting Can the patient actively participate in an intensive therapy program of at least 3 hrs of therapy 5 days a week? Yes The potential for patient to  make measurable gains while on inpatient rehab is excellent Anticipated functional outcomes upon discharge from inpatient rehab: modified independent PT, modified independent OT, n/a SLP Estimated rehab length of stay to reach the above functional goals is: 5-7 Anticipated discharge destination: Home 10. Overall Rehab/Functional Prognosis: excellent   MD Signature: Jennye Boroughs MD

## 2021-09-09 ENCOUNTER — Inpatient Hospital Stay (HOSPITAL_COMMUNITY)
Admission: RE | Admit: 2021-09-09 | Discharge: 2021-09-24 | DRG: 559 | Disposition: A | Discharge status: Home Health | Payer: Medicare Other | Source: Intra-hospital | Attending: Physical Medicine & Rehabilitation | Admitting: Physical Medicine & Rehabilitation

## 2021-09-09 ENCOUNTER — Encounter (HOSPITAL_COMMUNITY): Payer: Self-pay | Admitting: Physical Medicine & Rehabilitation

## 2021-09-09 ENCOUNTER — Other Ambulatory Visit: Payer: Self-pay

## 2021-09-09 DIAGNOSIS — D61818 Other pancytopenia: Secondary | ICD-10-CM | POA: Diagnosis present

## 2021-09-09 DIAGNOSIS — I132 Hypertensive heart and chronic kidney disease with heart failure and with stage 5 chronic kidney disease, or end stage renal disease: Secondary | ICD-10-CM | POA: Diagnosis present

## 2021-09-09 DIAGNOSIS — Z8271 Family history of polycystic kidney: Secondary | ICD-10-CM

## 2021-09-09 DIAGNOSIS — S8291XA Unspecified fracture of right lower leg, initial encounter for closed fracture: Secondary | ICD-10-CM | POA: Diagnosis not present

## 2021-09-09 DIAGNOSIS — W010XXD Fall on same level from slipping, tripping and stumbling without subsequent striking against object, subsequent encounter: Secondary | ICD-10-CM | POA: Diagnosis present

## 2021-09-09 DIAGNOSIS — N189 Chronic kidney disease, unspecified: Secondary | ICD-10-CM | POA: Diagnosis not present

## 2021-09-09 DIAGNOSIS — S8292XA Unspecified fracture of left lower leg, initial encounter for closed fracture: Secondary | ICD-10-CM | POA: Diagnosis not present

## 2021-09-09 DIAGNOSIS — Z90710 Acquired absence of both cervix and uterus: Secondary | ICD-10-CM | POA: Diagnosis not present

## 2021-09-09 DIAGNOSIS — Z882 Allergy status to sulfonamides status: Secondary | ICD-10-CM

## 2021-09-09 DIAGNOSIS — R000 Tachycardia, unspecified: Secondary | ICD-10-CM | POA: Diagnosis present

## 2021-09-09 DIAGNOSIS — N2581 Secondary hyperparathyroidism of renal origin: Secondary | ICD-10-CM | POA: Diagnosis present

## 2021-09-09 DIAGNOSIS — D62 Acute posthemorrhagic anemia: Secondary | ICD-10-CM | POA: Diagnosis present

## 2021-09-09 DIAGNOSIS — I5022 Chronic systolic (congestive) heart failure: Secondary | ICD-10-CM | POA: Diagnosis present

## 2021-09-09 DIAGNOSIS — E785 Hyperlipidemia, unspecified: Secondary | ICD-10-CM | POA: Diagnosis present

## 2021-09-09 DIAGNOSIS — E559 Vitamin D deficiency, unspecified: Secondary | ICD-10-CM | POA: Diagnosis present

## 2021-09-09 DIAGNOSIS — J439 Emphysema, unspecified: Secondary | ICD-10-CM | POA: Diagnosis present

## 2021-09-09 DIAGNOSIS — R5381 Other malaise: Secondary | ICD-10-CM | POA: Diagnosis present

## 2021-09-09 DIAGNOSIS — D631 Anemia in chronic kidney disease: Secondary | ICD-10-CM | POA: Diagnosis present

## 2021-09-09 DIAGNOSIS — S82201E Unspecified fracture of shaft of right tibia, subsequent encounter for open fracture type I or II with routine healing: Secondary | ICD-10-CM | POA: Diagnosis not present

## 2021-09-09 DIAGNOSIS — U071 COVID-19: Secondary | ICD-10-CM

## 2021-09-09 DIAGNOSIS — Z881 Allergy status to other antibiotic agents status: Secondary | ICD-10-CM

## 2021-09-09 DIAGNOSIS — Z8261 Family history of arthritis: Secondary | ICD-10-CM

## 2021-09-09 DIAGNOSIS — Z8616 Personal history of COVID-19: Secondary | ICD-10-CM | POA: Diagnosis not present

## 2021-09-09 DIAGNOSIS — S2249XA Multiple fractures of ribs, unspecified side, initial encounter for closed fracture: Secondary | ICD-10-CM | POA: Diagnosis present

## 2021-09-09 DIAGNOSIS — Z79899 Other long term (current) drug therapy: Secondary | ICD-10-CM

## 2021-09-09 DIAGNOSIS — S52602D Unspecified fracture of lower end of left ulna, subsequent encounter for closed fracture with routine healing: Secondary | ICD-10-CM | POA: Diagnosis not present

## 2021-09-09 DIAGNOSIS — S7292XD Unspecified fracture of left femur, subsequent encounter for closed fracture with routine healing: Secondary | ICD-10-CM | POA: Diagnosis present

## 2021-09-09 DIAGNOSIS — I2510 Atherosclerotic heart disease of native coronary artery without angina pectoris: Secondary | ICD-10-CM | POA: Diagnosis present

## 2021-09-09 DIAGNOSIS — S52502D Unspecified fracture of the lower end of left radius, subsequent encounter for closed fracture with routine healing: Secondary | ICD-10-CM

## 2021-09-09 DIAGNOSIS — K219 Gastro-esophageal reflux disease without esophagitis: Secondary | ICD-10-CM | POA: Diagnosis present

## 2021-09-09 DIAGNOSIS — S82401B Unspecified fracture of shaft of right fibula, initial encounter for open fracture type I or II: Secondary | ICD-10-CM | POA: Diagnosis not present

## 2021-09-09 DIAGNOSIS — I25810 Atherosclerosis of coronary artery bypass graft(s) without angina pectoris: Secondary | ICD-10-CM | POA: Diagnosis not present

## 2021-09-09 DIAGNOSIS — R160 Hepatomegaly, not elsewhere classified: Secondary | ICD-10-CM | POA: Diagnosis present

## 2021-09-09 DIAGNOSIS — S72402A Unspecified fracture of lower end of left femur, initial encounter for closed fracture: Secondary | ICD-10-CM | POA: Diagnosis present

## 2021-09-09 DIAGNOSIS — Z955 Presence of coronary angioplasty implant and graft: Secondary | ICD-10-CM

## 2021-09-09 DIAGNOSIS — E871 Hypo-osmolality and hyponatremia: Secondary | ICD-10-CM | POA: Diagnosis present

## 2021-09-09 DIAGNOSIS — Z992 Dependence on renal dialysis: Secondary | ICD-10-CM | POA: Diagnosis not present

## 2021-09-09 DIAGNOSIS — Z7901 Long term (current) use of anticoagulants: Secondary | ICD-10-CM

## 2021-09-09 DIAGNOSIS — S9031XD Contusion of right foot, subsequent encounter: Secondary | ICD-10-CM

## 2021-09-09 DIAGNOSIS — Z87891 Personal history of nicotine dependence: Secondary | ICD-10-CM

## 2021-09-09 DIAGNOSIS — Q613 Polycystic kidney, unspecified: Secondary | ICD-10-CM | POA: Diagnosis not present

## 2021-09-09 DIAGNOSIS — Z825 Family history of asthma and other chronic lower respiratory diseases: Secondary | ICD-10-CM

## 2021-09-09 DIAGNOSIS — N186 End stage renal disease: Secondary | ICD-10-CM | POA: Diagnosis present

## 2021-09-09 DIAGNOSIS — Z841 Family history of disorders of kidney and ureter: Secondary | ICD-10-CM

## 2021-09-09 DIAGNOSIS — K5900 Constipation, unspecified: Secondary | ICD-10-CM | POA: Diagnosis present

## 2021-09-09 DIAGNOSIS — Z888 Allergy status to other drugs, medicaments and biological substances status: Secondary | ICD-10-CM

## 2021-09-09 DIAGNOSIS — R110 Nausea: Secondary | ICD-10-CM | POA: Diagnosis present

## 2021-09-09 DIAGNOSIS — Z8249 Family history of ischemic heart disease and other diseases of the circulatory system: Secondary | ICD-10-CM

## 2021-09-09 DIAGNOSIS — S82201B Unspecified fracture of shaft of right tibia, initial encounter for open fracture type I or II: Secondary | ICD-10-CM | POA: Diagnosis not present

## 2021-09-09 DIAGNOSIS — R42 Dizziness and giddiness: Secondary | ICD-10-CM | POA: Diagnosis present

## 2021-09-09 LAB — RENAL FUNCTION PANEL
Albumin: 2.2 g/dL — ABNORMAL LOW (ref 3.5–5.0)
Anion gap: 13 (ref 5–15)
BUN: 86 mg/dL — ABNORMAL HIGH (ref 6–20)
CO2: 24 mmol/L (ref 22–32)
Calcium: 7.9 mg/dL — ABNORMAL LOW (ref 8.9–10.3)
Chloride: 88 mmol/L — ABNORMAL LOW (ref 98–111)
Creatinine, Ser: 8.64 mg/dL — ABNORMAL HIGH (ref 0.44–1.00)
GFR, Estimated: 5 mL/min — ABNORMAL LOW (ref >60)
Glucose, Bld: 91 mg/dL (ref 70–99)
Phosphorus: 4.2 mg/dL (ref 2.5–4.6)
Potassium: 4.8 mmol/L (ref 3.5–5.1)
Sodium: 125 mmol/L — ABNORMAL LOW (ref 135–145)

## 2021-09-09 LAB — CBC
HCT: 24.6 % — ABNORMAL LOW (ref 36.0–46.0)
Hemoglobin: 7.7 g/dL — ABNORMAL LOW (ref 12.0–15.0)
MCH: 27.5 pg (ref 26.0–34.0)
MCHC: 31.3 g/dL (ref 30.0–36.0)
MCV: 87.9 fL (ref 80.0–100.0)
Platelets: 111 10*3/uL — ABNORMAL LOW (ref 150–400)
RBC: 2.8 MIL/uL — ABNORMAL LOW (ref 3.87–5.11)
RDW: 15.2 % (ref 11.5–15.5)
WBC: 5.2 10*3/uL (ref 4.0–10.5)
nRBC: 0 % (ref 0.0–0.2)

## 2021-09-09 MED ORDER — APIXABAN 2.5 MG PO TABS: 2.5000 mg | ORAL_TABLET | Freq: Two times a day (BID) | ORAL | Status: DC

## 2021-09-09 MED ORDER — FERRIC CITRATE 1 GM 210 MG(FE) PO TABS
210.0000 mg | ORAL_TABLET | Freq: Three times a day (TID) | ORAL | Status: DC
  Administered 2021-09-09 – 2021-09-24 (×44): 210 mg via ORAL
  Filled 2021-09-09 (×47): qty 1

## 2021-09-09 MED ORDER — TRAZODONE HCL 50 MG PO TABS
25.0000 mg | ORAL_TABLET | Freq: Every evening | ORAL | Status: DC | PRN
  Filled 2021-09-09 (×2): qty 1

## 2021-09-09 MED ORDER — OXYCODONE HCL 5 MG PO TABS: 5.0000 mg | ORAL_TABLET | ORAL | 0 refills | Status: DC | PRN

## 2021-09-09 MED ORDER — ALTEPLASE 2 MG IJ SOLR: 2.0000 mg | Freq: Once | INTRAMUSCULAR | Status: DC | PRN

## 2021-09-09 MED ORDER — METOCLOPRAMIDE HCL 5 MG PO TABS
5.0000 mg | ORAL_TABLET | Freq: Three times a day (TID) | ORAL | Status: DC | PRN
  Administered 2021-09-12 – 2021-09-23 (×3): 10 mg via ORAL
  Filled 2021-09-09 (×3): qty 2

## 2021-09-09 MED ORDER — LIDOCAINE-PRILOCAINE 2.5-2.5 % EX CREA: 1.0000 | TOPICAL_CREAM | CUTANEOUS | Status: DC | PRN

## 2021-09-09 MED ORDER — MIDODRINE HCL 5 MG PO TABS: 5.0000 mg | ORAL_TABLET | ORAL | Status: DC | PRN

## 2021-09-09 MED ORDER — APIXABAN 2.5 MG PO TABS
2.5000 mg | ORAL_TABLET | Freq: Two times a day (BID) | ORAL | Status: DC
  Administered 2021-09-09 – 2021-09-24 (×30): 2.5 mg via ORAL
  Filled 2021-09-09 (×30): qty 1

## 2021-09-09 MED ORDER — VITAMIN D (ERGOCALCIFEROL) 1.25 MG (50000 UNIT) PO CAPS: 50000.0000 [IU] | ORAL_CAPSULE | ORAL | Status: DC

## 2021-09-09 MED ORDER — BISACODYL 5 MG PO TBEC
5.0000 mg | DELAYED_RELEASE_TABLET | Freq: Every day | ORAL | Status: DC | PRN
  Administered 2021-09-20: 5 mg via ORAL
  Filled 2021-09-09: qty 1

## 2021-09-09 MED ORDER — HEPARIN SODIUM (PORCINE) 1000 UNIT/ML DIALYSIS: 1000.0000 [IU] | INTRAMUSCULAR | Status: DC | PRN

## 2021-09-09 MED ORDER — PENTAFLUOROPROP-TETRAFLUOROETH EX AERO: 1.0000 | INHALATION_SPRAY | CUTANEOUS | Status: DC | PRN

## 2021-09-09 MED ORDER — METOCLOPRAMIDE HCL 5 MG/ML IJ SOLN: 5.0000 mg | Freq: Three times a day (TID) | INTRAMUSCULAR | Status: DC | PRN

## 2021-09-09 MED ORDER — PROSOURCE PLUS PO LIQD
30.0000 mL | Freq: Two times a day (BID) | ORAL | Status: DC
  Administered 2021-09-10 – 2021-09-12 (×5): 30 mL via ORAL
  Filled 2021-09-09 (×6): qty 30

## 2021-09-09 MED ORDER — PANTOPRAZOLE SODIUM 40 MG PO TBEC
40.0000 mg | DELAYED_RELEASE_TABLET | Freq: Two times a day (BID) | ORAL | Status: DC
  Administered 2021-09-09 – 2021-09-24 (×30): 40 mg via ORAL
  Filled 2021-09-09 (×30): qty 1

## 2021-09-09 MED ORDER — METHOCARBAMOL 500 MG PO TABS
500.0000 mg | ORAL_TABLET | Freq: Four times a day (QID) | ORAL | Status: DC | PRN
  Administered 2021-09-21 – 2021-09-23 (×4): 500 mg via ORAL
  Filled 2021-09-09 (×5): qty 1

## 2021-09-09 MED ORDER — ONDANSETRON HCL 4 MG/2ML IJ SOLN
4.0000 mg | Freq: Four times a day (QID) | INTRAMUSCULAR | Status: DC | PRN
  Administered 2021-09-14 – 2021-09-19 (×2): 4 mg via INTRAVENOUS
  Filled 2021-09-09 (×2): qty 2

## 2021-09-09 MED ORDER — VITAMIN D (ERGOCALCIFEROL) 1.25 MG (50000 UNIT) PO CAPS
50000.0000 [IU] | ORAL_CAPSULE | ORAL | Status: DC
  Administered 2021-09-15 – 2021-09-22 (×2): 50000 [IU] via ORAL
  Filled 2021-09-09 (×2): qty 1

## 2021-09-09 MED ORDER — FLEET ENEMA 7-19 GM/118ML RE ENEM: 1.0000 | ENEMA | Freq: Once | RECTAL | Status: DC | PRN

## 2021-09-09 MED ORDER — DOCUSATE SODIUM 100 MG PO CAPS
100.0000 mg | ORAL_CAPSULE | Freq: Two times a day (BID) | ORAL | Status: DC
  Administered 2021-09-10 – 2021-09-15 (×3): 100 mg via ORAL
  Filled 2021-09-09 (×12): qty 1

## 2021-09-09 MED ORDER — DOXERCALCIFEROL 4 MCG/2ML IV SOLN
8.0000 ug | INTRAVENOUS | Status: DC
  Administered 2021-09-11 – 2021-09-24 (×6): 8 ug via INTRAVENOUS
  Filled 2021-09-09 (×11): qty 4

## 2021-09-09 MED ORDER — HEPARIN SODIUM (PORCINE) 1000 UNIT/ML DIALYSIS
1000.0000 [IU] | INTRAMUSCULAR | Status: DC | PRN
  Filled 2021-09-09: qty 1

## 2021-09-09 MED ORDER — MIDODRINE HCL 5 MG PO TABS
5.0000 mg | ORAL_TABLET | ORAL | Status: DC | PRN
  Administered 2021-09-19: 5 mg via ORAL
  Filled 2021-09-09: qty 1

## 2021-09-09 MED ORDER — POLYETHYLENE GLYCOL 3350 17 G PO PACK: 17.0000 g | PACK | Freq: Every day | ORAL | Status: DC | PRN

## 2021-09-09 MED ORDER — DIPHENHYDRAMINE HCL 12.5 MG/5ML PO ELIX: 12.5000 mg | ORAL_SOLUTION | ORAL | Status: DC | PRN

## 2021-09-09 MED ORDER — HEPARIN SODIUM (PORCINE) 1000 UNIT/ML DIALYSIS: 20.0000 [IU]/kg | INTRAMUSCULAR | Status: DC | PRN

## 2021-09-09 MED ORDER — CHLORHEXIDINE GLUCONATE CLOTH 2 % EX PADS
6.0000 | MEDICATED_PAD | Freq: Every day | CUTANEOUS | Status: DC
  Administered 2021-09-10 – 2021-09-24 (×14): 6 via TOPICAL

## 2021-09-09 MED ORDER — GUAIFENESIN ER 600 MG PO TB12
1200.0000 mg | ORAL_TABLET | Freq: Two times a day (BID) | ORAL | Status: AC
  Administered 2021-09-09 – 2021-09-10 (×3): 1200 mg via ORAL
  Filled 2021-09-09 (×3): qty 2

## 2021-09-09 MED ORDER — ACETAMINOPHEN 325 MG PO TABS: 325.0000 mg | ORAL_TABLET | Freq: Four times a day (QID) | ORAL | Status: DC | PRN

## 2021-09-09 MED ORDER — LIDOCAINE HCL (PF) 1 % IJ SOLN: 5.0000 mL | INTRAMUSCULAR | Status: DC | PRN

## 2021-09-09 MED ORDER — DARBEPOETIN ALFA 150 MCG/0.3ML IJ SOSY: 150.0000 ug | PREFILLED_SYRINGE | INTRAMUSCULAR | Status: DC

## 2021-09-09 MED ORDER — PROSOURCE PLUS PO LIQD: 30.0000 mL | Freq: Two times a day (BID) | ORAL | Status: DC

## 2021-09-09 MED ORDER — OXYCODONE HCL 5 MG PO TABS
5.0000 mg | ORAL_TABLET | ORAL | Status: DC | PRN
  Administered 2021-09-09 – 2021-09-10 (×2): 10 mg via ORAL
  Administered 2021-09-11 – 2021-09-15 (×9): 5 mg via ORAL
  Administered 2021-09-16 (×2): 10 mg via ORAL
  Administered 2021-09-17 – 2021-09-18 (×6): 5 mg via ORAL
  Administered 2021-09-19 (×3): 10 mg via ORAL
  Administered 2021-09-20: 5 mg via ORAL
  Administered 2021-09-21 – 2021-09-23 (×5): 10 mg via ORAL
  Filled 2021-09-09 (×2): qty 1
  Filled 2021-09-09: qty 2
  Filled 2021-09-09: qty 1
  Filled 2021-09-09: qty 2
  Filled 2021-09-09: qty 1
  Filled 2021-09-09 (×3): qty 2
  Filled 2021-09-09 (×2): qty 1
  Filled 2021-09-09 (×3): qty 2
  Filled 2021-09-09: qty 1
  Filled 2021-09-09 (×3): qty 2
  Filled 2021-09-09: qty 1
  Filled 2021-09-09 (×4): qty 2
  Filled 2021-09-09 (×2): qty 1
  Filled 2021-09-09 (×2): qty 2
  Filled 2021-09-09 (×2): qty 1
  Filled 2021-09-09 (×2): qty 2

## 2021-09-09 MED ORDER — HYDRALAZINE HCL 10 MG PO TABS: 10.0000 mg | ORAL_TABLET | Freq: Four times a day (QID) | ORAL | Status: DC | PRN

## 2021-09-09 MED ORDER — ANTICOAGULANT SODIUM CITRATE 4% (200MG/5ML) IV SOLN: 5.0000 mL | Status: DC | PRN

## 2021-09-09 MED ORDER — ONDANSETRON HCL 4 MG PO TABS
4.0000 mg | ORAL_TABLET | Freq: Four times a day (QID) | ORAL | Status: DC | PRN
  Administered 2021-09-10 – 2021-09-23 (×7): 4 mg via ORAL
  Filled 2021-09-09 (×7): qty 1

## 2021-09-09 MED ORDER — ATORVASTATIN CALCIUM 40 MG PO TABS
40.0000 mg | ORAL_TABLET | Freq: Every day | ORAL | Status: DC
  Administered 2021-09-09 – 2021-09-23 (×15): 40 mg via ORAL
  Filled 2021-09-09 (×15): qty 1

## 2021-09-09 MED ORDER — ACETAMINOPHEN 325 MG PO TABS: 325.0000 mg | ORAL_TABLET | ORAL | Status: DC | PRN

## 2021-09-09 MED ORDER — METHOCARBAMOL 500 MG PO TABS: 500.0000 mg | ORAL_TABLET | Freq: Four times a day (QID) | ORAL | Status: DC | PRN

## 2021-09-09 NOTE — Progress Notes (Signed)
PMR Admission Coordinator Pre-Admission Assessment   Patient: Kendra Clay is an 61 y.o., female MRN: [id number] DOB: [birth year] Height: _0  (160 cm) Weight: 69.7 kg   Insurance Information HMO:     PPO:      PCP:      IPA:      80/20: yes     OTHER:  PRIMARY: Medicare A and B      Policy#:   [id number]    Subscriber: Pt. Phone#: Verified online    Fax#:  Pre-Cert#:       Employer:  Benefits:  Phone #:      Name:  Eff. Date: Parts A 08/03/12 and B effective 08/04/14 Deduct: $1600      Out of Pocket Max:  None      Life Max: N/A  CIR: 100%      SNF: 100 days Outpatient: 80%     Co-Pay: 20% Home Health: 100%      Co-Pay: none DME: 80%     Co-Pay: 20% Providers: patient's choice SECONDARY: [employer]      Policy#: [id number]     Phone#:    Financial Counselor:      Phone#:    The "Data Collection Information Summary" for patients in Inpatient Rehabilitation Facilities with attached "Privacy Act [place] Records" was provided and verbally reviewed with: Patient   Emergency Contact Information Contact Information       Name Relation Home Work Mobile    Joes Son     [phone]           Current Medical History  Patient Admitting Diagnosis: Critical Illness Myopathy History of Present Illness: Kendra Clay is a 61 y.o. female with past medical history of ESRD on HD MWF, essential hypertension, coronary artery disease status post stent placement in 2017, dilated cardiomyopathy, chronic combined diastolic and systolic CHF, who initially presented [facility] ED7/27/23 after a ground-level fall while walking up the stairs.  She incurred multiple fractures, to her left supracondylar distal femur fracture, right tibia shaft fracture, left distal radius and ulna fracture.  The patient underwent ORIF of L femur fracture and L wrist fracture and IM nail with I&D of R tibia with ortho on 08/29/21. Pt. Also tested positive for COVID 08/29/21 and was placed on 10 day  airborne/contact precautions. Pt. With associated hypoxia without evidence of pneumonia. Patient treated with molnupiravir and prednisone. Currently asymptomatic. Completed molnupiravir and prednisone. Pt. Seen by PT/OT and they recommended CIR to assist return to PLOF.    Patient's medical record from [facility] has been reviewed by the rehabilitation admission coordinator and physician.   Past Medical History      Past Medical History:  Diagnosis Date   Complication of anesthesia     Enlarged liver      secondary to PKD   ESRD on hemodialysis ([place])      MWF [place]   Heart murmur      "slight" per ? Dr Deatra Ina 30 years ago. 2D ECHO  30 yearsa go.   History of blood transfusion      C- Section   History of bronchitis      numerous, last time> 1 year   Hypertension     Night muscle spasms      legs   Polycystic kidney disease      Genetic dx 23 years ago   PONV (postoperative nausea and vomiting)      patch helped  Scoliosis     Strabismus        Has the patient had major surgery during 100 days prior to admission? Yes   Family History   family history includes Arthritis in her mother; Asthma in her son; Hypertension in her maternal grandmother; Kidney disease in her father; Polycystic kidney disease in her son.   Current Medications   Current Facility-Administered Medications:    (feeding supplement) PROSource Plus liquid 30 mL, 30 mL, Oral, BID BM, Stovall, Kathryn R, PA-C, 30 mL at 09/07/21 1500   0.9 %  sodium chloride infusion, , Intravenous, Continuous, Corinne Ports, PA-C, Last Rate: 10 mL/hr at 08/29/21 2200, New Bag at 08/29/21 2200   acetaminophen (TYLENOL) tablet 325-650 mg, 325-650 mg, Oral, Q6H PRN, Corinne Ports, PA-C   apixaban (ELIQUIS) tablet 2.5 mg, 2.5 mg, Oral, BID, Rai, Ripudeep K, MD, 2.5 mg at 09/07/21 2131   atorvastatin (LIPITOR) tablet 40 mg, 40 mg, Oral, QHS, Rai, Ripudeep K, MD, 40 mg at 09/07/21 2131   Chlorhexidine  Gluconate Cloth 2 % PADS 6 each, 6 each, Topical, Q0600, Penninger, Ria Comment, PA, 6 each at 09/07/21 1337   [START ON 09/09/2021] Darbepoetin Alfa (ARANESP) injection 150 mcg, 150 mcg, Intravenous, Q Mon-HD, Stovall, Kathryn R, PA-C   diphenhydrAMINE (BENADRYL) 12.5 MG/5ML elixir 12.5-25 mg, 12.5-25 mg, Oral, Q4H PRN, McClung, Sarah A, PA-C   docusate sodium (COLACE) capsule 100 mg, 100 mg, Oral, BID, Corinne Ports, PA-C, 100 mg at 09/07/21 2131   doxercalciferol (HECTOROL) injection 8 mcg, 8 mcg, Intravenous, Q M,W,F-HD, Penninger, Lindsay, PA, 8 mcg at 09/06/21 0946   ferric citrate (AURYXIA) tablet 210 mg, 210 mg, Oral, TID WC, Rai, Ripudeep K, MD, 210 mg at 09/08/21 0816   guaiFENesin (MUCINEX) 12 hr tablet 1,200 mg, 1,200 mg, Oral, BID, Mariel Aloe, MD, 1,200 mg at 09/07/21 2131   hydrALAZINE (APRESOLINE) tablet 10 mg, 10 mg, Oral, Q6H PRN, Corinne Ports, PA-C   HYDROmorphone (DILAUDID) injection 0.5-1 mg, 0.5-1 mg, Intravenous, Q4H PRN, Corinne Ports, PA-C, 1 mg at 09/03/21 1514   menthol-cetylpyridinium (CEPACOL) lozenge 3 mg, 1 lozenge, Oral, PRN, Rai, Ripudeep K, MD   methocarbamol (ROBAXIN) tablet 500 mg, 500 mg, Oral, Q6H PRN, 500 mg at 09/03/21 0859 **OR** methocarbamol (ROBAXIN) 500 mg in dextrose 5 % 50 mL IVPB, 500 mg, Intravenous, Q6H PRN, McClung, Sarah A, PA-C   metoCLOPramide (REGLAN) tablet 5-10 mg, 5-10 mg, Oral, Q8H PRN **OR** metoCLOPramide (REGLAN) injection 5-10 mg, 5-10 mg, Intravenous, Q8H PRN, McClung, Sarah A, PA-C   midodrine (PROAMATINE) tablet 5 mg, 5 mg, Oral, PRN, Stovall, Kathryn R, PA-C   ondansetron (ZOFRAN) tablet 4 mg, 4 mg, Oral, Q6H PRN **OR** ondansetron (ZOFRAN) injection 4 mg, 4 mg, Intravenous, Q6H PRN, McClung, Sarah A, PA-C, 4 mg at 09/03/21 1823   oxyCODONE (Oxy IR/ROXICODONE) immediate release tablet 5-10 mg, 5-10 mg, Oral, Q4H PRN, Corinne Ports, PA-C, 5 mg at 09/07/21 2131   pantoprazole (PROTONIX) EC tablet 40 mg, 40 mg, Oral, BID, Rai,  Ripudeep K, MD, 40 mg at 09/07/21 2132   polyethylene glycol (MIRALAX / GLYCOLAX) packet 17 g, 17 g, Oral, Daily PRN, McClung, Sarah A, PA-C   Vitamin D (Ergocalciferol) (DRISDOL) 1.25 MG (50000 UNIT) capsule 50,000 Units, 50,000 Units, Oral, Q7 days, Penninger, [place], PA   Patients Current Diet:  Diet Order                  Diet renal with fluid  restriction Fluid restriction: 1200 mL Fluid; Room service appropriate? Yes; Fluid consistency: Thin  Diet effective now                         Precautions / Restrictions Precautions Precautions: Fall Precaution Comments: airborne/contact: COVID + Other Brace: camboot on RLE with transfers/mobility only Restrictions Weight Bearing Restrictions: Yes LUE Weight Bearing: Weight bear through elbow only RLE Weight Bearing: Weight bearing as tolerated LLE Weight Bearing: Weight bearing as tolerated    Has the patient had 2 or more falls or a fall with injury in the past year? No   Prior Activity Level Community (5-7x/wk): Pt active in the community PTA   Prior Functional Level Self Care: Did the patient need help bathing, dressing, using the toilet or eating? Independent   Indoor Mobility: Did the patient need assistance with walking from room to room (with or without device)? Independent   Stairs: Did the patient need assistance with internal or external stairs (with or without device)? Independent   Functional Cognition: Did the patient need help planning regular tasks such as shopping or remembering to take medications? Independent   Patient Information Are you of Hispanic, Latino/a,or Spanish origin?: A. No, not of Hispanic, Latino/a, or Spanish origin What is your race?: A. White Do you need or want an interpreter to communicate with a doctor or health care staff?: 0. No   Patient's Response To:  Health Literacy and Transportation Is the patient able to respond to health literacy and transportation needs?: Yes Health  Literacy - How often do you need to have someone help you when you read instructions, pamphlets, or other written material from your doctor or pharmacy?: Never In the past 12 months, has lack of transportation kept you from medical appointments or from getting medications?: No In the past 12 months, has lack of transportation kept you from meetings, work, or from getting things needed for daily living?: No   Home Assistive Devices / [place] Devices/Equipment: Wende Mott, Environmental consultant (specify type), Cane (specify quad or straight) Home Equipment: Cane - single point, Conservation officer, nature (2 wheels), Rollator (4 wheels), Shower seat - built in   Prior Device Use: Indicate devices/aids used by the patient prior to current illness, exacerbation or injury? None of the above   Current Functional Level Cognition   Overall Cognitive Status: Within Functional Limits for tasks assessed Orientation Level: Oriented X4 General Comments: slight anxiety noted during session when provided with education and multimodel cueing to correct form and/or technique during functional mobility and use of walker.    Extremity Assessment (includes Sensation/Coordination)   Upper Extremity Assessment: LUE deficits/detail, RUE deficits/detail RUE Deficits / Details: generalized weakness LUE Deficits / Details: limited due to casting from hand to 2/3 of forearm LUE Coordination: decreased fine motor, decreased gross motor  Lower Extremity Assessment: RLE deficits/detail, LLE deficits/detail RLE Deficits / Details: AAROM flexion at knee in supine about 30, in sitting about 80, wearing camboot so ankle not assessed, positive quad set RLE: Unable to fully assess due to immobilization LLE Deficits / Details: AAROM limited knee flexion due to pain, in sitting up to about 60, weak quad set and ankle DF/PF noted mildly limited by pain     ADLs   Overall ADL's : Needs assistance/impaired Eating/Feeding: Modified  independent, Sitting Grooming: Wash/dry face, Applying deodorant, Set up, Sitting Upper Body Bathing: Set up, Sitting, Cueing for compensatory techniques Upper Body Bathing Details (indicate cue type and  reason): educated on how to wash her R arm pit one handed tech. Lower Body Bathing: Maximal assistance, Total assistance, Sit to/from stand Lower Body Bathing Details (indicate cue type and reason): pt. in standing with platform walker encouraged to attempt cleaning her peri areas "i cant" Upper Body Dressing : Minimal assistance, Sitting Lower Body Dressing: Maximal assistance, Sitting/lateral leans Lower Body Dressing Details (indicate cue type and reason): education for pt. to assist as able with cam boot. assisted into long sitting with great resistance, once in longsitting assisted with velcro and encouraged her she states the velcro "is too strong and my son is just [place] do this for me anyway" reviewed importance of her efforts prior to assistance so she can continue with strengthening and increasing independence Toilet Transfer: Maximal assistance, Cueing for safety, Cueing for sequencing Toilet Transfer Details (indicate cue type and reason): PFRW- simulated with pivotal steps from eob to [place] and Hygiene: Total assistance Toileting - Clothing Manipulation Details (indicate cue type and reason): Can maintain standing with Mod A Functional mobility during ADLs: Maximal assistance General ADL Comments: pt. requires max encouragement and redirection for attempts at participation and completion of tasks.  leads with "no" or "i cant" but once engaged demonstrates abilties for mobility and adls.  at end of sesion, reviewed all that she had accomplished she states "i knew i can do it i can do a lot of things im just going to complain about it first".  pt. also shared her mother passed away 3 weeks ago on 5w here at [facility]. states she believes that contributed to her  fall and accident that brought her here.     Mobility   Overal bed mobility: Needs Assistance Bed Mobility: Supine to Sit Rolling: Mod assist Supine to sit: Min assist General bed mobility comments: physical assistance for long sitting, mina a for guiding bles out of bed     Transfers   Overall transfer level: Needs assistance Equipment used: Left platform walker Transfers: Sit to/from Stand, Bed to chair/wheelchair/BSC Sit to Stand: Mod assist, From elevated surface Bed to/from chair/wheelchair/BSC transfer type:: Step pivot Squat pivot transfers: Mod assist, +2 physical assistance Step pivot transfers: Mod assist, Max assist General transfer comment: max cues for posture and to iniate movement of RLE mid transfer encouraged stepping back one more time pt. states "i cant but look i made it to sit down anyway" and had a semi controlled sit not fully squared up to recliner     Ambulation / Gait / Stairs / Wheelchair Mobility         Posture / Balance Dynamic Sitting Balance Sitting balance - Comments: sitting on EOB working on scooting towards the EOB. Balance Overall balance assessment: Needs assistance Sitting-balance support: No upper extremity supported, Feet unsupported Sitting balance-Leahy Scale: Fair Sitting balance - Comments: sitting on EOB working on scooting towards the EOB. Standing balance support: Bilateral upper extremity supported, During functional activity, Reliant on assistive device for balance Standing balance-Leahy Scale: Poor Standing balance comment: heavy reliance on platform walker and external assist.     Special needs/care consideration Special service needs none  MWF HD    Previous Home Environment (from acute therapy documentation) Living Arrangements: Children  Lives With: Family Available Help at Discharge: Family, Available 24 hours/day Type of Home: House Home Layout: One level Home Access: Stairs to enter Entrance Stairs-Rails:  None Entrance Stairs-Number of Steps: 2 Bathroom Shower/Tub: Chiropodist: Standard Home Care Services: No  Discharge Living Setting Plans for Discharge Living Setting: House Type of Home at Discharge: House Discharge Home Layout: One level Discharge Home Access: Stairs to enter Entrance Stairs-Rails: None Entrance Stairs-Number of Steps: 2 Discharge Bathroom Shower/Tub: Tub/shower unit Discharge Bathroom Toilet: Standard Discharge Bathroom Accessibility: Yes How Accessible: Accessible via wheelchair, Accessible via walker Does the patient have any problems obtaining your medications?: No   Social/Family/Support Systems Patient Roles: Other (Comment) Contact Information: [phone] Anticipated Caregiver: Tashika Goodin (son) Anticipated Caregiver's Contact Information: [phone] Ability/Limitations of Caregiver: 24/7 Caregiver Availability: 24/7 Discharge Plan Discussed with Primary Caregiver: Yes Is Caregiver In Agreement with Plan?: Yes   Goals Patient/Family Goal for Rehab: PT/OT Mod I Expected length of stay: 5-7 days Pt/Family Agrees to Admission and willing to participate: Yes Program Orientation Provided & Reviewed with Pt/Caregiver Including Roles  & Responsibilities: Yes   Decrease burden of Care through IP rehab admission: n/a   Possible need for SNF placement upon discharge: not anticipated   Patient Condition: I have reviewed medical records from [facility], spoken with CM, and patient and family member. I met with patient at the bedside for inpatient rehabilitation assessment.  Patient will benefit from ongoing PT and OT, can actively participate in 3 hours of therapy a day 5 days of the week, and can make measurable gains during the admission.  Patient will also benefit from the coordinated team approach during an Inpatient Acute Rehabilitation admission.  The patient will receive intensive therapy as well as Rehabilitation  physician, nursing, social worker, and care management interventions.  Due to safety, disease management, medication administration, pain management, and patient education the patient requires 24 hour a day rehabilitation nursing.  The patient is currently min A  with mobility and basic ADLs.  Discharge setting and therapy post discharge at home with home health is anticipated.  Patient has agreed to participate in the Acute Inpatient Rehabilitation Program and will admit today.   Preadmission Screen Completed By:  Genella Mech, 09/08/2021 11:44 AM ______________________________________________________________________   Discussed status with Dr. Curlene Dolphin on 09/09/21 at 38 and received approval for admission today.   Admission Coordinator:  Genella Mech, CCC-SLP, time 1030/Date 09/09/21    Assessment/Plan: Diagnosis: Polytrauma after fall with Left supracondylar distal femur fracture, Right open tibial shaft fracture, Left distal radius and ulnar fracture Does the need for close, 24 hr/day Medical supervision in concert with the patient's rehab needs make it unreasonable for this patient to be served in a less intensive setting? Yes Co-Morbidities requiring supervision/potential complications: ESRD on HD< hyponatremia, recent Covid 19, Heart failure with decreased EF, anemia, pancytopenia Due to bladder management, bowel management, safety, skin/wound care, disease management, medication administration, pain management, and patient education, does the patient require 24 hr/day rehab nursing? Yes Does the patient require coordinated care of a physician, rehab nurse, PT, OT, and SLP to address physical and functional deficits in the context of the above medical diagnosis(es)? Yes Addressing deficits in the following areas: balance, endurance, locomotion, strength, transferring, bowel/bladder control, bathing, dressing, feeding, grooming, and toileting Can the patient actively participate in an  intensive therapy program of at least 3 hrs of therapy 5 days a week? Yes The potential for patient to make measurable gains while on inpatient rehab is excellent Anticipated functional outcomes upon discharge from inpatient rehab: modified independent PT, modified independent OT, n/a SLP Estimated rehab length of stay to reach the above functional goals is: 5-7 Anticipated discharge destination: Home 10. Overall Rehab/Functional Prognosis:  excellent     MD Signature: Jennye Boroughs MD

## 2021-09-09 NOTE — H&P (Signed)
Physical Medicine and Rehabilitation Admission H&P    CC: Debility secondary to fall resulting in multiple fractures  HPI: Kendra Clay is a 61 year old female who suffered a fall at home resulting in multiple fractures.  She has a history of end-stage renal disease due to polycystic kidney disease and is on chronic intermittent hemodialysis on Mondays, Wednesdays and Fridays.  On the morning of 08/29/2021, the patient was at home walking down steps to her garage with her cane, she lost her balance fell against a garbage can which slipped out from under her causing her to fall to the ground. She was brought to [facility] emergency department where imaging revealed right open tibial shaft fracture, left distal femur fracture and left distal radius and possible ulnar fracture.  Dr. Doreatha Martin was consulted and she was brought to the operating room later that same day and underwent irrigation debridement of right open tibial shaft fracture with intramedullary nailing.  She also underwent open reduction internal fixation of left distal femur fracture and ORIF of left distal radius fracture.  She tolerated the procedures well.  Nephrology was consulted.  She dialyzes RUE AV fistula.  She was diagnosed with COVID-19 infection and was treated withmolnupiravir and prednisone.  She denies constipation. She denies any current respiratory symptoms. Her past medical history is significant for previous fall resulting in left tibial shaft fracture requiring IM nailing.  She has history of prior cigarette smoking, anemia, hypertension, hyperlipidemia, gastroesophageal reflux disease and moderate to severe emphysema. She is allowed to weight-bear as tolerated on both lower extremities and is nonweightbearing through the left wrist and weightbearing as tolerated to the left elbow.  Continue boot to the right lower extremity and splint to the left upper extremity.  Maintain right lower extremity boot for therapy and  mobilization but can come off while she is in the bed.  Eliquis initiated for DVT prophylaxis. The patient requires inpatient physical medicine and rehabilitation evaluations and treatment secondary to dysfunction due to multiple fractures.  Review of Systems  Constitutional:  Negative for chills and fever.  HENT:  Negative for sore throat and tinnitus.   Eyes:  Negative for double vision and discharge.  Respiratory:  Positive for cough and sputum production.   Cardiovascular:  Negative for chest pain and palpitations.  Gastrointestinal:  Negative for nausea and vomiting.  Genitourinary:        Scant urine production  Musculoskeletal:        Right lower extremity post-op pain  Neurological:  Positive for weakness. Negative for dizziness and headaches.  Psychiatric/Behavioral:  Negative for depression. The patient has insomnia.        Some nights are better, slept during dialysis   Past Medical History:  Diagnosis Date   Complication of anesthesia    Enlarged liver    secondary to PKD   ESRD on hemodialysis ([place])    MWF [place]   Heart murmur    "slight" per ? Dr Deatra Ina 30 years ago. 2D ECHO  30 yearsa go.   History of blood transfusion    C- Section   History of bronchitis    numerous, last time> 1 year   Hypertension    Night muscle spasms    legs   Polycystic kidney disease    Genetic dx 23 years ago   PONV (postoperative nausea and vomiting)    patch helped   Scoliosis    Strabismus    Past Surgical History:  Procedure Laterality Date  ABDOMINAL HYSTERECTOMY     AV FISTULA PLACEMENT  09/01/2011   Procedure: ARTERIOVENOUS (AV) FISTULA CREATION;  Surgeon: Rosetta Posner, MD;  Location: [place];  Service: Vascular;  Laterality: Right;   [place]     for lazy eye   ORIF FEMUR FRACTURE Left 08/29/2021   Procedure: OPEN REDUCTION INTERNAL FIXATION (ORIF) DISTAL FEMUR FRACTURE;  Surgeon: Shona Needles, MD;  Location: [place];  Service: Orthopedics;   Laterality: Left;   ORIF WRIST FRACTURE Left 08/29/2021   Procedure: OPEN REDUCTION INTERNAL FIXATION (ORIF) WRIST FRACTURE;  Surgeon: Shona Needles, MD;  Location: [place];  Service: Orthopedics;  Laterality: Left;   TIBIA IM NAIL INSERTION Left 05/19/2018   Procedure: INTRAMEDULLARY (IM) NAIL TIBIAL;  Surgeon: Hiram Gash, MD;  Location: [place];  Service: Orthopedics;  Laterality: Left;   TIBIA IM NAIL INSERTION Right 08/29/2021   Procedure: INTRAMEDULLARY (IM) NAIL TIBIAL WITH IRRIGATION AND DEBRIDMENT;  Surgeon: Shona Needles, MD;  Location: [place];  Service: Orthopedics;  Laterality: Right;   Family History  Problem Relation Age of Onset   Arthritis Mother    Kidney disease Father    Polycystic kidney disease Son    Asthma Son    Hypertension Maternal Grandmother    Social History:  reports that she quit smoking about 6 years ago. Her smoking use included cigarettes. She started smoking about 41 years ago. She has a 45.00 pack-year smoking history. She quit smokeless tobacco use about 7 years ago. She reports that she does not drink alcohol and does not use drugs. Allergies:  Allergies  Allergen Reactions   E-Mycin [Erythromycin] Hives   Sulfa Antibiotics Rash    Other reaction(s): Skin Rash Other reaction(s): Skin Rash    Lisinopril Cough   Medications Prior to Admission  Medication Sig Dispense Refill   acetaminophen (TYLENOL) 325 MG tablet Take 1-2 tablets (325-650 mg total) by mouth every 6 (six) hours as needed for mild pain (pain score 1-3 or temp > 100.5).     apixaban (ELIQUIS) 2.5 MG TABS tablet Take 1 tablet (2.5 mg total) by mouth 2 (two) times daily for 20 days.     atorvastatin (LIPITOR) 40 MG tablet Take 40 mg by mouth at bedtime.     cinacalcet (SENSIPAR) 30 MG tablet Take 30 mg by mouth daily with supper.     dextromethorphan-guaiFENesin (MUCINEX DM) 30-600 MG 12hr tablet Take 2 tablets by mouth as needed for cough.     ferric citrate (AURYXIA) 1 GM 210 MG(Fe)  tablet Take 210 mg by mouth 3 (three) times daily with meals.     lidocaine (LIDODERM) 5 % Place 1 patch onto the skin daily. Remove & Discard patch within 12 hours or as directed by MD (Patient not taking: Reported on 07/31/2020) 20 patch 0   loperamide (IMODIUM A-D) 2 MG tablet Take 2 mg by mouth as needed for diarrhea or loose stools.     loratadine (CLARITIN) 10 MG tablet Take 10 mg by mouth daily as needed for allergies.     methocarbamol (ROBAXIN) 500 MG tablet Take 1 tablet (500 mg total) by mouth every 6 (six) hours as needed for muscle spasms.     midodrine (PROAMATINE) 5 MG tablet Take 1 tablet (5 mg total) by mouth as needed (for hypotension during dialysis).     nitroGLYCERIN (NITROSTAT) 0.4 MG SL tablet Place 0.4 mg under the tongue every 5 (five) minutes as  needed for chest pain.     Nutritional Supplements (,FEEDING SUPPLEMENT, PROSOURCE PLUS) liquid Take 30 mLs by mouth 2 (two) times daily between meals.     oxyCODONE (OXY IR/ROXICODONE) 5 MG immediate release tablet Take 1-2 tablets (5-10 mg total) by mouth every 4 (four) hours as needed for moderate pain or severe pain (5 mg moderate pain, 10 mg severe pain).  0   pantoprazole (PROTONIX) 40 MG tablet Take 40 mg by mouth 2 (two) times daily.     polyethylene glycol powder (MIRALAX) powder Start taking 1 capful 3 times a day. Slowly cut back as needed until you have normal bowel movements. (Patient taking differently: Take 17 g by mouth daily as needed for mild constipation.) 255 g 0   VENTOLIN HFA 108 (90 Base) MCG/ACT inhaler INHALE 2 PUFFS INTO THE LUNGS EVERY 6 HOURS AS NEEDED (Patient not taking: Reported on 08/31/2021) 18 g 5   [START ON 09/15/2021] Vitamin D, Ergocalciferol, (DRISDOL) 1.25 MG (50000 UNIT) CAPS capsule Take 1 capsule (50,000 Units total) by mouth every 7 (seven) days. 5 capsule       Home: Home Living Family/patient expects to be discharged to:: Private residence Living Arrangements: Children Available Help at  Discharge: Family, Available 24 hours/day Type of Home: House Home Access: Stairs to enter [employer] of Steps: 2 Entrance Stairs-Rails: None Home Layout: One level Bathroom Shower/Tub: Chiropodist: Standard Home Equipment: Kasandra Knudsen - single point, Conservation officer, nature (2 wheels), Rollator (4 wheels), Shower seat - built in  Lives With: Family   Functional History: Prior Function Prior Level of Function : Independent/Modified Independent Mobility Comments: uses cane   Functional Status:  Mobility: Bed Mobility Overal bed mobility: Needs Assistance Bed Mobility: Supine to Sit Rolling: Mod assist Supine to sit: Min assist General bed mobility comments: physical assistance for long sitting, mina a for guiding bles out of bed Transfers Overall transfer level: Needs assistance Equipment used: Left platform walker Transfers: Sit to/from Stand, Bed to chair/wheelchair/BSC Sit to Stand: Mod assist, From elevated surface Bed to/from chair/wheelchair/BSC transfer type:: Step pivot Squat pivot transfers: Mod assist, +2 physical assistance Step pivot transfers: Mod assist, Max assist General transfer comment: max cues for posture and to iniate movement of RLE mid transfer encouraged stepping back one more time pt. states "i cant but look i made it to sit down anyway" and had a semi controlled sit not fully squared up to recliner   ADL: ADL Overall ADL's : Needs assistance/impaired Eating/Feeding: Modified independent, Sitting Grooming: Wash/dry face, Applying deodorant, Set up, Sitting Upper Body Bathing: Set up, Sitting, Cueing for compensatory techniques Upper Body Bathing Details (indicate cue type and reason): educated on how to wash her R arm pit one handed tech. Lower Body Bathing: Maximal assistance, Total assistance, Sit to/from stand Lower Body Bathing Details (indicate cue type and reason): pt. in standing with platform walker encouraged to attempt  cleaning her peri areas "i cant" Upper Body Dressing : Minimal assistance, Sitting Lower Body Dressing: Maximal assistance, Sitting/lateral leans Lower Body Dressing Details (indicate cue type and reason): education for pt. to assist as able with cam boot. assisted into long sitting with great resistance, once in longsitting assisted with velcro and encouraged her she states the velcro "is too strong and my son is just [place] do this for me anyway" reviewed importance of her efforts prior to assistance so she can continue with strengthening and increasing independence Toilet Transfer: Maximal assistance, Cueing for safety, Cueing for  sequencing Toilet Transfer Details (indicate cue type and reason): PFRW- simulated with pivotal steps from eob to [place] and Hygiene: Total assistance Toileting - Clothing Manipulation Details (indicate cue type and reason): Can maintain standing with Mod A Functional mobility during ADLs: Maximal assistance General ADL Comments: pt. requires max encouragement and redirection for attempts at participation and completion of tasks.  leads with "no" or "i cant" but once engaged demonstrates abilties for mobility and adls.  at end of sesion, reviewed all that she had accomplished she states "i knew i can do it i can do a lot of things im just going to complain about it first".  pt. also shared her mother passed away 3 weeks ago on 5w here at [facility]. states she believes that contributed to her fall and accident that brought her here.   Cognition: Cognition Overall Cognitive Status: Within Functional Limits for tasks assessed Orientation Level: Oriented X4 Cognition Arousal/Alertness: Awake/alert Behavior During Therapy: WFL for tasks assessed/performed Overall Cognitive Status: Within Functional Limits for tasks assessed General Comments: slight anxiety noted during session when provided with education and multimodel cueing to correct form  and/or technique during functional mobility and use of walker.  Physical Exam: Blood pressure (!) 147/71, pulse 91, temperature 98.6 F (37 C), temperature source Oral, resp. rate 15, height 5\' 3"  (1.6 m), weight 70 kg, SpO2 99 %.    General: Alert and oriented x 4, No apparent distress HEENT: Head is normocephalic, atraumatic, PERRLA, EOMI, sclera anicteric, oral mucosa pink and moist, dentition intact, ext ear canals clear,  Neck: Supple without JVD or lymphadenopathy Heart: Reg rate and rhythm. No murmurs rubs or gallops Chest: CTA bilaterally without wheezes, rales, or rhonchi; no distress Abdomen: Soft, non-tender, non-distended, bowel sounds positive. Extremities: No clubbing, cyanosis. B/L LE edema Pulses are 2+ Psych: Pt's affect is appropriate. Pt is cooperative Skin: Clean and intact without signs of breakdown Multiple incisions in the bilateral lower extremities, incision lateral to the left knee, left lateral thigh, right lateral knee, cervix incisions at the lower right leg Incisions well approximated. Bruising of right anterior lower leg. Bruising left AC from blood draws.  Splint at the left wrist appears clean and dry RUE AVF Neuro:, Alert and oriented x 4 follows commands, speech and language normal Sensation to light touch intact in all 4 extremities Right upper extremity 5 out of 5 strength Left upper extremity 5- out of 5 shoulder abduction, able to bend elbow with flexion and extension, able to move her fingers, MMT limited by weightbearing restrictions Right lower extremity 4 - /5 hip flexion 4 - /5 knee extension , 2 out of 5 ankle plantarflexion and dorsiflexion Left lower extremity 3 /5 hip flexion 3 /5 knee extension , 3 out of 5 ankle plantarflexion and dorsiflexion Sensation intact light touch in all 4 extremities Musculoskeletal:  No abnormal tone, normal muscle bulk, tenderness throughout bilateral lower extremities and left upper extremity to  palpation  IV LUE  Results for orders placed or performed during the hospital encounter of 08/29/21 (from the past 48 hour(s))  Renal function panel     Status: Abnormal   Collection Time: 09/09/21  8:13 AM  Result Value Ref Range   Sodium 125 (L) 135 - 145 mmol/L   Potassium 4.8 3.5 - 5.1 mmol/L   Chloride 88 (L) 98 - 111 mmol/L   CO2 24 22 - 32 mmol/L   Glucose, Bld 91 70 - 99 mg/dL    Comment:  Glucose reference range applies only to samples taken after fasting for at least 8 hours.   BUN 86 (H) 6 - 20 mg/dL   Creatinine, Ser 8.64 (H) 0.44 - 1.00 mg/dL   Calcium 7.9 (L) 8.9 - 10.3 mg/dL   Phosphorus 4.2 2.5 - 4.6 mg/dL   Albumin 2.2 (L) 3.5 - 5.0 g/dL   GFR, Estimated 5 (L) >60 mL/min    Comment: (NOTE) Calculated using the CKD-EPI Creatinine Equation (2021)    Anion gap 13 5 - 15    Comment: Performed at [facility] [address] [place], [place] [address]  CBC     Status: Abnormal   Collection Time: 09/09/21  8:13 AM  Result Value Ref Range   WBC 5.2 4.0 - 10.5 K/uL   RBC 2.80 (L) 3.87 - 5.11 MIL/uL   Hemoglobin 7.7 (L) 12.0 - 15.0 g/dL   HCT 24.6 (L) 36.0 - 46.0 %   MCV 87.9 80.0 - 100.0 fL   MCH 27.5 26.0 - 34.0 pg   MCHC 31.3 30.0 - 36.0 g/dL   RDW 15.2 11.5 - 15.5 %   Platelets 111 (L) 150 - 400 K/uL   nRBC 0.0 0.0 - 0.2 %    Comment: Performed at [facility], [place] [address], [place], [place] [address]   No results found.    Blood pressure (!) 147/71, pulse 91, temperature 98.6 F (37 C), temperature source Oral, resp. rate 15, height 5\' 3"  (1.6 m), weight 70 kg, SpO2 99 %.  Medical Problem List and Plan: 1. Functional deficits secondary to debility secondary to multiple fractures after fall  -patient may not shower  -ELOS/Goals: 5-7 days, PT/OT mod I  -Admit to CIR 2.  Antithrombotics: -DVT/anticoagulation:  Pharmaceutical: Eliquis  -antiplatelet therapy: none 3. Pain Management: Tylenol, oxycodone as needed 4. Mood/Behavior/Sleep:  LCSW to evaluate and provide emotional support  -antipsychotic agents: n/a 5. Neuropsych/cognition: This patient is capable of making decisions on her own behalf. 6. Skin/Wound Care: Routine skin care checks  -monitor surgical incisions 7. Fluids/Electrolytes/Nutrition: Strict Is and Os; nephrology following  -daily weights  -renal diet/1200 cc fluid restriction 8: Left femur fracture s/p ORIF: WBAT 9:Left distal radius and ulnar fracture s/p ORIF  -Nonweightbearing through the wrist, continue splint 10: Right open tibial fracture s/p IM nail:WBAT  -wear CAM boot with therapy/mobilization 11: ESRD on HD: M/W/F schedule per nephrology, she has a RUE AVF 12: CAD s/p BMS to LAD in 2017: Stable, denies chest pain  -follows with Dr. Otho Perl (last seen 05/2021) 13: Covid pneumonia: treated and off precautions, denies SOB  -continue FV; Mucinex 14: Vitamin D deficiency: continue supplementation 15: Chronic heart failure (EF 40% in 2017): stable (normal left ventricular systolic function)  -daily weights 16: Emphysema: FV/IS 17: Pancytopenia, improved, recheck labs tomorrow 18: Anemia: CKD/ABL s/p 2 units packed cells, Last HGB 7.7 on 8/7  -continue Aranesp, Auryxia per nephrology  -follow-up CBC 19: GERD: continue Protonix 20: Hyperlipidemia: continue Lipitor 21: Labile BP: hydralazine 10 mg q 6 hours prn sys BP > 170  -midodrine 5 mg as needed only at the start of dialysis   Barbie Banner, PA-C 09/09/2021  I have personally performed a face to face diagnostic evaluation of this patient and formulated the key components of the plan.  Additionally, I have personally reviewed laboratory data, imaging studies, as well as relevant notes and concur with the physician assistant's documentation above.  The patient's status has not changed  from the original H&P.  Any changes in documentation from the acute care chart have been noted above.  Jennye Boroughs, MD, [facility]   Jennye Boroughs,  MD 09/09/2021

## 2021-09-09 NOTE — Progress Notes (Signed)
Report given to Kennyth Lose, RN on CIR.

## 2021-09-09 NOTE — Progress Notes (Signed)
Received patient in bed to unit.  Alert and oriented.  Informed consent signed and in  chart.   Treatment initiated: 0821 Treatment completed: 1037  Patient tolerated well.  Treatment at bedside  alert, without acute distress.  Hand-off given to patient's nurse.   Access used: AVF Access issues: None  Total UF removed: 900 mls Medication(s) given: Aranesp and Hectorol Post HD VS:  See vitals Post HD weight: 70.0 kg  Pt requested off treatment.  C/o not feeling well.  Pain meds administered.  NP aware.     [facility]

## 2021-09-09 NOTE — Care Management Important Message (Signed)
Important Message  Patient Details  Name: Kendra Clay MRN: [id number] Date of Birth: 1960-09-12   Medicare Important Message Given:  Yes     Hannah Beat 09/09/2021, 1:25 PM

## 2021-09-09 NOTE — Progress Notes (Signed)
   09/09/21 1100  Assess: MEWS Score  BP 109/69  MAP (mmHg) 81  Pulse Rate (!) 106  Level of Consciousness Alert  SpO2 98 %  Assess: MEWS Score  MEWS Temp 0  MEWS Systolic 0  MEWS Pulse 1  MEWS RR 1  MEWS LOC 0  MEWS Score 2  MEWS Score Color Yellow  Assess: if the MEWS score is Yellow or Red  Were vital signs taken at a resting state? Yes  Focused Assessment No change from prior assessment  Does the patient meet 2 or more of the SIRS criteria? Yes  Does the patient have a confirmed or suspected source of infection? No  MEWS guidelines implemented *See Row Information* Yes  Treat  MEWS Interventions Administered prn meds/treatments  Pain Scale 0-10  Pain Score 5  Pain Type Surgical pain  Pain Location Foot  Pain Orientation Right  Pain Intervention(s) Medication (See eMAR)  Take Vital Signs  Increase Vital Sign Frequency  Yellow: Q 2hr X 2 then Q 4hr X 2, if remains yellow, continue Q 4hrs  Escalate  MEWS: Escalate Yellow: discuss with charge nurse/RN and consider discussing with provider and RRT  Notify: Charge Nurse/RN  Name of Charge Nurse/RN Notified Lauren RN  Date Charge Nurse/RN Notified 09/09/21  Time Charge Nurse/RN Notified 1211  Notify: Provider  Provider Name/Title Dr. Georgena Spurling  Date Provider Notified 09/09/21  Time Provider Notified 1212  Method of Notification Face-to-face  Notification Reason Other (Comment) (Yellow MEWS VS)  Provider response No new orders  Date of Provider Response 09/09/21  Time of Provider Response 1212  Document  Patient Outcome Stabilized after interventions  Progress note created (see row info) Yes  Assess: SIRS CRITERIA  SIRS Temperature  0  SIRS Pulse 1  SIRS Respirations  0  SIRS WBC 1  SIRS Score Sum  2   Pain and VS improved after intervention

## 2021-09-09 NOTE — Progress Notes (Signed)
Patient arrieved via bed from 5 N-19 at approximately 1630.  Report received from [employer]. Patient is A&O x 4 and able to make her needs known. Bilateral lungs diminished lower lobes. ABD soft non distend. Incontinent of bowel, black/green. Left AC large bruise, patient states it is from all the blood draws. Right arm AV fistula, thrill and bruit noted. Left lateral thigh Sutures/glue CDI, Right lateral leg Sutures/glue CDI. +2 edema to LLE, +1 edema to RLE. Positive pedal pulses, cap refill less than 3 seconds and warm to the touch. Bilateral legs elevated. Rates pain 2/10, declines any pain medications. Call light and personal items within reach. CHG bath completed.

## 2021-09-09 NOTE — Progress Notes (Signed)
  [facility] Progress Note   Subjective:  Seen in room -on dialysis. Tolerating UF. Some coughing. Breathing ok. Off Covid precautions today. Transfer to CIR   Objective Vitals:   09/09/21 0800 09/09/21 0821 09/09/21 0830 09/09/21 0900  BP: (!) 148/75 (!) 148/75 (!) 153/74 137/69  Pulse:  87 87 (!) 104  Resp:      Temp:      TempSrc:      SpO2:  90% 95% 99%  Weight:      Height:       Physical Exam General: Well appearing woman, NAD. Room air Heart: RRR; no murmur Lungs: CTAB, no rales Abdomen: soft Extremities: No LE edema; RLE with bruising and 2 sutured incisions. L forearm bandaged Dialysis Access: RUE AVF + thrill    Additional Objective Labs: Basic Metabolic Panel: Recent Labs  Lab 09/04/21 1400 09/06/21 0850 09/06/21 1632  NA 126* 128* 134*  K 3.8 4.2 4.2  CL 87* 91* 92*  CO2 25 27 31   GLUCOSE 94 93 101*  BUN 57* 41* 22*  CREATININE 7.89* 6.74* 3.89*  CALCIUM 7.5* 7.9* 8.5*  PHOS  --  3.3  --     Liver Function Tests: Recent Labs  Lab 09/06/21 0850  ALBUMIN 2.2*    CBC: Recent Labs  Lab 09/03/21 0604 09/04/21 1400 09/06/21 0850  WBC 6.9 6.9 5.4  HGB 9.7* 9.0* 8.7*  HCT 29.4* 27.5* 26.3*  MCV 87.2 86.8 86.8  PLT 161 175 115*    Medications:  sodium chloride 10 mL/hr at 08/29/21 2200   methocarbamol (ROBAXIN) IV      (feeding supplement) PROSource Plus  30 mL Oral BID BM   apixaban  2.5 mg Oral BID   atorvastatin  40 mg Oral QHS   Chlorhexidine Gluconate Cloth  6 each Topical Q0600   darbepoetin (ARANESP) injection - DIALYSIS  150 mcg Intravenous Q Mon-HD   docusate sodium  100 mg Oral BID   doxercalciferol  8 mcg Intravenous Q M,W,F-HD   ferric citrate  210 mg Oral TID WC   guaiFENesin  1,200 mg Oral BID   pantoprazole  40 mg Oral BID   Vitamin D (Ergocalciferol)  50,000 Units Oral Q7 days    Dialysis Orders: [facility] MWF  3h 16min  350/1.5   60.5kg  2/2 bath  P2  Hep 1200  RFA AVF - last Hb 9.5 on 7/26 -  doxercalciferol 53mcg IV q HD - Mircera 51mcg IV q 4wks, last 7/24   Assessment/Plan: Fall w/ traumatic extremity Fx: S/p ORIF L distal femur Fx, ORIF L distal radius/ulnar Fx, IM nailing of R open tibial Fx on 08/29/21.  Per ortho. On Eliquis for DVT prophylaxis. To CIR now off COVID precautions (8/7). COVID: Hypoxic on admit. Finished course of molnupiravir + steroids. ESRD: Continue HD on MWF schedule - HD today HTN/volume: BP stable - prior OP meds on hold (losartan, isosorbide, coreg). Uses midodrine only prn with HD - will adjust orders. Weight much higher but does not appear overloaded, ? from metal plates in extremities. Anemia of ESRD: Hgb 8.7. ESA due on 8/7 - ordered. S/p 2U PRBCs on 7/27. Follow. Secondary hyperparathyroidism: CorrCa/Phos good. Continue Auryxia as binder + VDRA. Nutrition: Alb low, adding supplements for wound healing. CAD/HFrEF Dispo: To CIR today     Lynnda Child PA-C [facility] 09/09/2021,9:32 AM

## 2021-09-09 NOTE — Progress Notes (Incomplete)
Inpatient Rehabilitation Admission Medication Review by a Pharmacist  A complete drug regimen review was completed for this patient to identify any potential clinically significant medication issues.  High Risk Drug Classes Is patient taking? Indication by Medication  Antipsychotic {Receiving?:26196}   Anticoagulant {Receiving?:26196}   Antibiotic {Receiving?:26196}   Opioid {Receiving?:26196}   Antiplatelet {Receiving?:26196}   Hypoglycemics/insulin {Yes or No?:26198}   Vasoactive Medication {Receiving?:26196}   Chemotherapy {Receiving Chemo?:26197}   Other {Yes or No?:26198} Atorvastatin Lidocaine patch Sensipar Auryxia Protonix     Type of Medication Issue Identified Description of Issue Recommendation(s)  Drug Interaction(s) (clinically significant)     Duplicate Therapy     Allergy     No Medication Administration End Date     Incorrect Dose     Additional Drug Therapy Needed     Significant med changes from prior encounter (inform family/care partners about these prior to discharge).    Other  PTA medications: Losartan Carvedilol Imdur     Clinically significant medication issues were identified that warrant physician communication and completion of prescribed/recommended actions by midnight of the next day:  {Yes or No?:26198}  Name of provider notified for urgent issues identified:   Provider Method of Notification:     Pharmacist comments:   Time spent performing this drug regimen review (minutes):  ***

## 2021-09-09 NOTE — Discharge Summary (Signed)
Physician Discharge Summary   Patient: Kendra Clay MRN: [id number] DOB: Aug 21, 1960  Admit date:     08/29/2021  Discharge date: 09/09/21  Discharge Physician: Cordelia Poche, MD   PCP: Patient, [name]   Recommendations at discharge:  Outpatient follow-up with orthopedic surgery Weightbearing recommendations: Weightbearing as tolerated bilateral lower extremities Weightbearing as tolerated through elbow and left upper extremity Nonweightbearing through the wrist Dressing care: Splint to left upper extremity Other incisions can be left open to air Orthopedic devices:  Boot to right lower extremity for therapy and mobilization but can come off while she is in bed.  Discharge Diagnoses: Principal Problem:   Fracture of shaft of right tibia and fibula, open type I or II, initial encounter Active Problems:   Polycystic kidney disease   End stage renal disease ([place])   Chronic HFrEF (heart failure with reduced ejection fraction) (HCC)   Closed fracture of left distal femur (HCC)   Closed fracture of left distal radius   Ground-level fall   Pancytopenia ([place])   Essential hypertension  Resolved Problems:   * No resolved hospital problems. *  Hospital Course: Kendra Clay is a 61 y.o. female with a history of ESRD on HD, CAD s/p stent placement, chronic combined heart failure. Patient presented after a fall, suffering multiple fractures. Patient was managed by trauma surgery on admission. During admission, she was found to be COVID-19 positive and started on an antiviral and steroids for treatment. Patient set up for discharge to acute inpatient rehabilitation.  Assessment and Plan:  Left supracondylar distal femur fracture Right open tibial shaft fracture Left distal radius and ulnar fracture Secondary to fall at home. Orthopedic surgery consulted and performed ORIF of left distal femur fracture, IM nailing of right tibial shaft fracture, ORIF of left distal radius  fracture, percutaneous fixation of left distal ulna fracture and I&D of right open tibial fracture. Recommendation for Eliquis for VTE prophylaxis per orthopedic surgery. Plan for discharge to CIR once off of airborne isolation.   ESRD on HD Nephrology consult. Patient received hemodialysis on a MWF schedule. Patient is receiving bedside HD while on airborne precautions for COVID-19 infection.   Acute on chronic hyponatremia Labile. Management via hemodialysis. Asymptomatic.   COVID-19 infection Associated hypoxia without evidence of pneumonia. Patient treated with molnupiravir and prednisone. Currently asymptomatic except for some phlegm production. Completed molnupiravir and prednisone.   Vitamin D deficiency Vitamin D supplementation q week   Chronic heart failure with reduced EF Stable. Volume management with HD.   CAD History of stent placement. No chest pain currently. Not previously on antiplatelet therapy. -Continue Lipitor   Primary hypertension Patient is on losartan, Imdur and Coreg as an outpatient which are currently held. Blood pressure is well controlled currently off home antihypertensives in setting of hemodialysis.   Acute on chronic anemia Anemia of chronic disease Acute anemia secondary to acute fractures and surgical management.   Pancytopenia Transient and resolved. Leukocytopenia and thrombocytopenia now resolved.   Consultants: Orthopedic surgery Procedures performed:  Open reduction internal fixation of left distal femur Intramedullary nailing of right tibial shaft fracture Open reduction internal fixation of left distal radius fracture Percutaneous fixation of left distal ulna fracture Irrigation and debridement of right open tibia fracture  Disposition: Rehabilitation facility Diet recommendation: Renal diet  DISCHARGE MEDICATION: Allergies as of 09/09/2021       Reactions   E-mycin [erythromycin] Hives   Sulfa Antibiotics Rash   Other  reaction(s): Skin Rash Other reaction(s):  Skin Rash   Lisinopril Cough        Medication List     STOP taking these medications    carvedilol 12.5 MG tablet Commonly known as: COREG   isosorbide mononitrate 30 MG 24 hr tablet Commonly known as: IMDUR   losartan 25 MG tablet Commonly known as: COZAAR       TAKE these medications    (feeding supplement) PROSource Plus liquid Take 30 mLs by mouth 2 (two) times daily between meals.   acetaminophen 325 MG tablet Commonly known as: TYLENOL Take 1-2 tablets (325-650 mg total) by mouth every 6 (six) hours as needed for mild pain (pain score 1-3 or temp > 100.5). What changed:  medication strength how much to take when to take this reasons to take this   apixaban 2.5 MG Tabs tablet Commonly known as: ELIQUIS Take 1 tablet (2.5 mg total) by mouth 2 (two) times daily for 20 days.   atorvastatin 40 MG tablet Commonly known as: LIPITOR Take 40 mg by mouth at bedtime.   cinacalcet 30 MG tablet Commonly known as: SENSIPAR Take 30 mg by mouth daily with supper.   dextromethorphan-guaiFENesin 30-600 MG 12hr tablet Commonly known as: MUCINEX DM Take 2 tablets by mouth as needed for cough.   ferric citrate 1 GM 210 MG(Fe) tablet Commonly known as: AURYXIA Take 210 mg by mouth 3 (three) times daily with meals.   lidocaine 5 % Commonly known as: LIDODERM Place 1 patch onto the skin daily. Remove & Discard patch within 12 hours or as directed by MD   loperamide 2 MG tablet Commonly known as: IMODIUM A-D Take 2 mg by mouth as needed for diarrhea or loose stools.   loratadine 10 MG tablet Commonly known as: CLARITIN Take 10 mg by mouth daily as needed for allergies.   methocarbamol 500 MG tablet Commonly known as: ROBAXIN Take 1 tablet (500 mg total) by mouth every 6 (six) hours as needed for muscle spasms.   midodrine 5 MG tablet Commonly known as: PROAMATINE Take 1 tablet (5 mg total) by mouth as needed (for  hypotension during dialysis).   nitroGLYCERIN 0.4 MG SL tablet Commonly known as: NITROSTAT Place 0.4 mg under the tongue every 5 (five) minutes as needed for chest pain.   oxyCODONE 5 MG immediate release tablet Commonly known as: Oxy IR/ROXICODONE Take 1-2 tablets (5-10 mg total) by mouth every 4 (four) hours as needed for moderate pain or severe pain (5 mg moderate pain, 10 mg severe pain).   pantoprazole 40 MG tablet Commonly known as: PROTONIX Take 40 mg by mouth 2 (two) times daily.   polyethylene glycol powder 17 GM/SCOOP powder Commonly known as: MiraLax Start taking 1 capful 3 times a day. Slowly cut back as needed until you have normal bowel movements. What changed:  how much to take how to take this when to take this reasons to take this additional instructions   Ventolin HFA 108 (90 Base) MCG/ACT inhaler Generic drug: albuterol INHALE 2 PUFFS INTO THE LUNGS EVERY 6 HOURS AS NEEDED   Vitamin D (Ergocalciferol) 1.25 MG (50000 UNIT) Caps capsule Commonly known as: DRISDOL Take 1 capsule (50,000 Units total) by mouth every 7 (seven) days. Start taking on: September 15, 2021        Discharge Exam: BP (!) 105/55 (BP Location: Left Arm)   Pulse (!) 111   Temp 98 F (36.7 C)   Resp (!) 24   Ht 5\' 3"  (1.6 m)  Wt 70 kg   SpO2 (!) 89%   BMI 27.34 kg/m   General exam: Appears calm and comfortable Respiratory system: Clear to auscultation. Respiratory effort normal. Cardiovascular system: S1 & S2 heard, RRR. No murmurs, rubs, gallops or clicks. Psychiatry: Judgement and insight appear normal. Mood & affect appropriate.   Condition at discharge: stable  The results of significant diagnostics from this hospitalization (including imaging, microbiology, ancillary and laboratory) are listed below for reference.   Imaging Studies: DG CHEST PORT 1 VIEW  Result Date: 08/30/2021 CLINICAL DATA:  COVID-19 EXAM: PORTABLE CHEST 1 VIEW COMPARISON:  Radiographs 08/29/2021  FINDINGS: Chronic emphysematous changes. Bandlike opacities in the right lower lung may represent atelectasis or scarring. Otherwise no focal consolidation, pleural effusion, or pneumothorax. Cardiomediastinal silhouette at the upper limits of normal. Vascular stent right axilla. No acute osseous abnormality. IMPRESSION: Chronic emphysematous changes.  No acute cardiopulmonary process. Electronically Signed   By: Placido Sou M.D.   On: 08/30/2021 13:14   DG Wrist 2 Views Left  Result Date: 08/29/2021 CLINICAL DATA:  Postop EXAM: LEFT WRIST - 2 VIEW COMPARISON:  08/29/2021 FINDINGS: Interval surgical plate and screw fixation of distal radius fracture with decreased displacement and overriding. External pin fixation of the distal ulna fracture with residual close to 1/2 shaft diameter ulnar and dorsal displacement of distal fracture fragment. IMPRESSION: Interval surgical fixation of distal radius and ulna fracture Electronically Signed   By: Donavan Foil M.D.   On: 08/29/2021 19:10   DG Knee Left Port  Result Date: 08/29/2021 CLINICAL DATA:  Postop EXAM: PORTABLE LEFT KNEE - 1-2 VIEW COMPARISON:  08/29/2021 FINDINGS: Previous intramedullary rodding of the tibia. Interval surgical plate and multiple screw fixation of comminuted distal femoral fracture with decreased impaction and displacement. Gas in the soft tissues consistent with recent surgery. IMPRESSION: Interval surgical fixation of comminuted distal femoral fracture Electronically Signed   By: Donavan Foil M.D.   On: 08/29/2021 19:08   DG Tibia/Fibula Right Port  Result Date: 08/29/2021 CLINICAL DATA:  Postop EXAM: PORTABLE RIGHT TIBIA AND FIBULA - 2 VIEW COMPARISON:  08/29/2021 FINDINGS: Interval intramedullary rodding and screw fixation of comminuted midshaft tibial fracture with overall decreased displacement and angulation. Comminuted fracture involving the mid to distal shaft of the fibula with residual 1/2 shaft diameter lateral and  anterior displacement of distal fracture fragment, overall improved. IMPRESSION: 1. Interval surgical fixation of comminuted tibial shaft fracture with decreased angulation and displacement 2. Comminuted fibular shaft fracture with overall decreased displacement and angulation Electronically Signed   By: Donavan Foil M.D.   On: 08/29/2021 19:07   DG Tibia/Fibula Right  Result Date: 08/29/2021 CLINICAL DATA:  Tibial fracture EXAM: RIGHT TIBIA AND FIBULA - 2 VIEW COMPARISON:  Same day tibia/fibula radiographs FINDINGS: Five C-arm fluoroscopic images were obtained intraoperatively and submitted for post operative interpretation. Initial image demonstrates the displaced and comminuted tibial and fibular diaphyseal fractures. Subsequent images demonstrate intramedullary rod and screw fixation of the tibia with improved fracture alignment. The fibular fracture alignment is also improved on the final images. Total dose 3.5 mGy total fluoro time 176.7 seconds. Please see the performing provider's procedural report for further detail. IMPRESSION: Intraoperative images during surgical fixation of the tibial diaphyseal fracture as above. Electronically Signed   By: Valetta Mole M.D.   On: 08/29/2021 18:50   DG Knee Complete 4 Views Left  Result Date: 08/29/2021 CLINICAL DATA:  Femur fracture. EXAM: LEFT KNEE - COMPLETE 4+ VIEW COMPARISON:  Same-day knee  radiographs FINDINGS: Six C-arm fluoroscopic images were obtained intraoperatively and submitted for post operative interpretation. The initial image demonstrates similar alignment of the comminuted distal femoral fracture. Subsequent images demonstrate sideplate and screw fixation of the fracture with improved alignment. Hardware alignment is within expected limits, without evidence of complication. Total fluoro time 176.7 seconds, dose 3.5 mGy. Please see the performing provider's procedural report for further detail. IMPRESSION: Intraoperative images during surgical  fixation of the distal femoral fracture as above. Electronically Signed   By: Valetta Mole M.D.   On: 08/29/2021 18:48   DG Wrist Complete Left  Result Date: 08/29/2021 CLINICAL DATA:  Wrist fracture. EXAM: LEFT WRIST - COMPLETE 3+ VIEW COMPARISON:  Same-day forearm radiographs FINDINGS: Six C-arm fluoroscopic images were obtained intraoperatively and submitted for post operative interpretation. The initial image demonstrates similar alignment of the distal radial and ulnar fractures. Subsequent images demonstrate sideplate and screw fixation of the distal radial fracture with improved alignment. The final images demonstrate pin fixation across the ulnar fracture with improved alignment. Fluoro time 176.7 seconds. Dose 3.5 mGy. Please see the performing provider's procedural report for further detail. IMPRESSION: Intraoperative images during surgical fixation of the distal radial and ulnar fractures as above. Electronically Signed   By: Valetta Mole M.D.   On: 08/29/2021 18:47   DG C-Arm 1-60 Min-No Report  Result Date: 08/29/2021 Fluoroscopy was utilized by the requesting physician.  No radiographic interpretation.   DG C-Arm 1-60 Min-No Report  Result Date: 08/29/2021 Fluoroscopy was utilized by the requesting physician.  No radiographic interpretation.   DG C-Arm 1-60 Min-No Report  Result Date: 08/29/2021 Fluoroscopy was utilized by the requesting physician.  No radiographic interpretation.   DG Knee Left Port  Result Date: 08/29/2021 CLINICAL DATA:  Fall, pain EXAM: PORTABLE LEFT KNEE - 1-2 VIEW COMPARISON:  None Available. FINDINGS: Generalized osteopenia. Comminuted fracture of the distal femoral metaphysis with apex anterior angulation and posterior angulation. No aggressive osseous lesion. Normal alignment. Intramedullary nail in the proximal tibia. Soft tissue are unremarkable. No radiopaque foreign body or soft tissue emphysema. IMPRESSION: 1. Generalized osteopenia. Comminuted  fracture of the distal femoral metaphysis with apex anterior angulation and posterior angulation. Electronically Signed   By: Kathreen Devoid M.D.   On: 08/29/2021 13:18   DG Chest Port 1 View  Result Date: 08/29/2021 CLINICAL DATA:  [place]. EXAM: PORTABLE CHEST 1 VIEW COMPARISON:  04/24/2020 FINDINGS: The cardiac silhouette, mediastinal and hilar contours are within normal limits given the AP projection and portable technique. Chronic emphysematous changes and pulmonary scarring but no acute overlying pulmonary process. No pleural effusions or pneumothorax. The bony thorax is grossly intact. No obvious acute rib fractures. Stable thoracic spine scoliosis. IMPRESSION: Chronic lung changes but no acute pulmonary findings. Electronically Signed   By: Marijo Sanes M.D.   On: 08/29/2021 13:13   DG Forearm Left  Result Date: 08/29/2021 CLINICAL DATA:  Fall, pain EXAM: LEFT FOREARM - 2 VIEW COMPARISON:  None Available. FINDINGS: There are transverse fractures through the distal radial and ulnar metaphyses. There is dorsal displacement of the distal fragments with approximately 1.2 cm dorsal displacement and 1.2 cm overlap of the distal radial fragment. There is mild radial displacement and angulation of the distal fragments. There is overlying soft tissue swelling. IMPRESSION: Displaced transverse fractures through the distal radial and ulnar metaphyses as above. Electronically Signed   By: Valetta Mole M.D.   On: 08/29/2021 13:10   DG Tibia/Fibula Right Port  Result Date: 08/29/2021  CLINICAL DATA:  Fall from standing position.  Pain. EXAM: PORTABLE RIGHT TIBIA AND FIBULA - 2 VIEW COMPARISON:  None Available. FINDINGS: Comminuted transverse tibia and fibular fractures are noted in the midshaft. Lateral and posterior displacement is noted. The knee and ankle joints are located. IMPRESSION: 1. Comminuted transverse midshaft tibia and fibular fractures as described. 2. Lateral and posterior displacement.  Electronically Signed   By: San Morelle M.D.   On: 08/29/2021 13:08   CT CHEST LUNG CA SCREEN LOW DOSE W/O CM  Result Date: 08/14/2021 CLINICAL DATA:  Lung cancer screening. Former asymptomatic smoker with 72 pack-year history. EXAM: CT CHEST WITHOUT CONTRAST LOW-DOSE FOR LUNG CANCER SCREENING TECHNIQUE: Multidetector CT imaging of the chest was performed following the standard protocol without IV contrast. RADIATION DOSE REDUCTION: This exam was performed according to the departmental dose-optimization program which includes automated exposure control, adjustment of the mA and/or kV according to patient size and/or use of iterative reconstruction technique. COMPARISON:  04/11/2018 FINDINGS: Cardiovascular: Mild cardiac enlargement. Trace pericardial thickening versus effusion. Aortic atherosclerosis and coronary artery calcifications. Mediastinum/Nodes: No enlarged mediastinal, hilar, or axillary lymph nodes. Thyroid gland, trachea, and esophagus demonstrate no significant findings. Lungs/Pleura: Centrilobular and paraseptal emphysema, moderate to severe. No pleural effusion or airspace consolidation identified. There are 2 small nodules on today's study which appear unchanged from previous exam. The largest is in the periphery of the right upper lobe with a mean derived diameter of 3.1 mm. No suspicious lung nodule. 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 Aortic atherosclerosis. Musculoskeletal: Thoracic scoliosis appears convex towards the left. No aggressive lytic or sclerotic bone lesions identified. IMPRESSION: 1. Lung-RADS 2, benign appearance or behavior. Continue annual screening with low-dose chest CT without contrast in 12 months. 2. Aortic Atherosclerosis (ICD10-I70.0) and Emphysema (ICD10-J43.9). 3. Coronary artery calcifications. 4. 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 Electronically Signed   By: Kerby Moors M.D.   On: 08/14/2021 11:03   Microbiology: Results for orders placed or performed during the hospital encounter of 08/29/21  Surgical pcr screen     Status: None   Collection Time: 08/29/21  2:38 PM   Specimen: Nasal Mucosa; Nasal Swab  Result Value Ref Range Status   MRSA, PCR NEGATIVE NEGATIVE Final   Staphylococcus aureus NEGATIVE NEGATIVE Final    Comment: (NOTE) The Xpert SA Assay (FDA approved for NASAL specimens in patients 26 years of age and older), is one component of a comprehensive surveillance program. It is not intended to diagnose infection nor to guide or monitor treatment. Performed at [facility], [place] [address], [place], [place] [address]   Resp Panel by RT-PCR (Flu A&B, Covid) Anterior Nasal Swab     Status: Abnormal   Collection Time: 08/30/21  6:37 AM   Specimen: Anterior Nasal Swab  Result Value Ref Range Status   SARS Coronavirus 2 by RT PCR POSITIVE (A) NEGATIVE Final    Comment: (NOTE) SARS-CoV-2 target nucleic acids are DETECTED.  The SARS-CoV-2 RNA is generally detectable in upper respiratory specimens during the acute phase of infection. Positive results are indicative of the presence of the identified virus, but do not rule out bacterial infection or co-infection with other pathogens not detected by the test. Clinical correlation with patient history and other diagnostic information is necessary to determine patient infection status. The expected result is Negative.  Fact Sheet for Patients: EntrepreneurPulse.com.au  Fact Sheet  for Healthcare Providers: IncredibleEmployment.be  This test is not yet approved or cleared by the [place] and  has been authorized for detection and/or diagnosis of SARS-CoV-2 by FDA under an Emergency Use Authorization (EUA).  This EUA will remain in effect (meaning this test can be used) for the duration of   the COVID-19 declaration under Section 564(b)(1) of the A ct, 21 U.S.C. section 360bbb-3(b)(1), unless the authorization is terminated or revoked sooner.     Influenza A by PCR NEGATIVE NEGATIVE Final   Influenza B by PCR NEGATIVE NEGATIVE Final    Comment: (NOTE) The Xpert Xpress SARS-CoV-2/FLU/RSV plus assay is intended as an aid in the diagnosis of influenza from Nasopharyngeal swab specimens and should not be used as a sole basis for treatment. Nasal washings and aspirates are unacceptable for Xpert Xpress SARS-CoV-2/FLU/RSV testing.  Fact Sheet for Patients: EntrepreneurPulse.com.au  Fact Sheet for Healthcare Providers: IncredibleEmployment.be  This test is not yet approved or cleared by the [place] FDA and has been authorized for detection and/or diagnosis of SARS-CoV-2 by FDA under an Emergency Use Authorization (EUA). This EUA will remain in effect (meaning this test can be used) for the duration of the COVID-19 declaration under Section 564(b)(1) of the Act, 21 U.S.C. section 360bbb-3(b)(1), unless the authorization is terminated or revoked.  Performed at [facility], [place] [address], [place], [place] [address]     Labs: CBC: Recent Labs  Lab 09/03/21 0604 09/04/21 1400 09/06/21 0850  WBC 6.9 6.9 5.4  HGB 9.7* 9.0* 8.7*  HCT 29.4* 27.5* 26.3*  MCV 87.2 86.8 86.8  PLT 161 175 287*   Basic Metabolic Panel: Recent Labs  Lab 09/03/21 0604 09/04/21 1400 09/06/21 0850 09/06/21 1632  NA 129* 126* 128* 134*  K 4.2 3.8 4.2 4.2  CL 92* 87* 91* 92*  CO2 28 25 27 31   GLUCOSE 120* 94 93 101*  BUN 31* 57* 41* 22*  CREATININE 5.63* 7.89* 6.74* 3.89*  CALCIUM 7.7* 7.5* 7.9* 8.5*  PHOS  --   --  3.3  --    Liver Function Tests: Recent Labs  Lab 09/06/21 0850  ALBUMIN 2.2*    Discharge time spent: 35 minutes.  Signed: Cordelia Poche, MD [facility] 09/09/2021

## 2021-09-10 DIAGNOSIS — S8291XA Unspecified fracture of right lower leg, initial encounter for closed fracture: Secondary | ICD-10-CM | POA: Diagnosis not present

## 2021-09-10 DIAGNOSIS — S2249XA Multiple fractures of ribs, unspecified side, initial encounter for closed fracture: Secondary | ICD-10-CM | POA: Diagnosis not present

## 2021-09-10 DIAGNOSIS — N186 End stage renal disease: Secondary | ICD-10-CM | POA: Diagnosis not present

## 2021-09-10 DIAGNOSIS — I5022 Chronic systolic (congestive) heart failure: Secondary | ICD-10-CM

## 2021-09-10 MED ORDER — DARBEPOETIN ALFA 150 MCG/0.3ML IJ SOSY: 150.0000 ug | PREFILLED_SYRINGE | INTRAMUSCULAR | Status: DC

## 2021-09-10 NOTE — Progress Notes (Signed)
Contacted by rehab CSW that pt's planned d/c date is 8/17. Contacted [facility] to advise staff of pt's d/c date and that pt will need to resume care oin 8/18. Also advised staff that per CSW, pt will need assistance with transportation to out-pt HD at d/c. Pt intends to contact clinic CSW to discuss options further per rehab CSW.   Melven Sartorius Renal Navigator [phone]

## 2021-09-10 NOTE — Progress Notes (Signed)
Occupational Therapy Session Note  Patient Details  Name: Kendra Clay MRN: [id number] Date of Birth: [birth year]  Today's Date: 09/10/2021 OT Individual Time: 1300-1330 OT Individual Time Calculation (min): 30 min  and Today's Date: 09/10/2021 OT Missed Time: 30 Minutes Missed Time Reason: Patient fatigue;Patient ill (comment);Other (comment) (nausea)   Short Term Goals: Week 1:  OT Short Term Goal 1 (Week 1): Patient will complete toilet transfer with mod A of 1 OT Short Term Goal 2 (Week 1): Patient will complete 1 step of LB dressing task OT Short Term Goal 3 (Week 1): Pt will tolerate standing at the sink with min A in preparation for BADL task  Skilled Therapeutic Interventions/Progress Updates:  Pt greeted semi-reclined in bed after lunch. Pt stated she was not feeling well and was still dealing with some nausea. Pt agreeable to try to sit EOB with encouragement. Pt needed OT assist to lift L LE and advance towards EOB, then mod A to elevate trunk. Once sitting, pt reported increased nausea and stated she could not stay up any longer. OT obtained drop arm BSC for when she is ready at attempt toilet transfer. Pt agreeable to bed level there-ex of straight leg raise, ankle pumps, and knee flexion. Pt very limited by pain with very limited hip and knee flexion on L side, able to tolerate more on R side. Mod guided A to perform there-ex. OT applied ice pack to R LE and pt left semi-reclined in bed with bed alarm on, call bell in reach, and needs met.  Balance/vestibular training;Cognitive remediation/compensation;Community reintegration;Discharge planning;Disease mangement/prevention;DME/adaptive equipment instruction;Functional electrical stimulation;Functional mobility training;Neuromuscular re-education;Pain management;Patient/family education;Psychosocial support;Self Care/advanced ADL retraining;Skin care/wound managment;Splinting/orthotics;Therapeutic Activities;Therapeutic Exercise;UE/LE  Strength taining/ROM;UE/LE Coordination activities;Visual/perceptual remediation/compensation;Wheelchair propulsion/positioning   Therapy Documentation Precautions:  Precautions Precautions: Fall Other Brace: camboot on RLE with transfers/mobility only Restrictions Weight Bearing Restrictions: Yes LUE Weight Bearing: Non weight bearing (WBAT through wrist) RLE Weight Bearing: Weight bearing as tolerated LLE Weight Bearing: Weight bearing as tolerated General: General OT Amount of Missed Time: 30 Minutes, fatigue, nausea Pain:  5/10 pain, B LE's and L wrist, Ice applied, rest and repositioned   Therapy/Group: Individual Therapy  Valma Cava 09/10/2021, 1:51 PM

## 2021-09-10 NOTE — Progress Notes (Signed)
Inpatient Rehabilitation  Patient information reviewed and entered into eRehab system by Fard Borunda M. Jasten Guyette, M.A., CCC/SLP, PPS Coordinator.  Information including medical coding, functional ability and quality indicators will be reviewed and updated through discharge.    

## 2021-09-10 NOTE — Progress Notes (Signed)
Inpatient Rehabilitation Care Coordinator Assessment and Plan Patient Details  Name: Kendra Clay MRN: [id number] Date of Birth: 08-Jun-1960  Today's Date: 09/10/2021  Hospital Problems: Principal Problem:   Multiple fractures of both lower extremities and ribs  Past Medical History:  Past Medical History:  Diagnosis Date   Complication of anesthesia    Enlarged liver    secondary to PKD   ESRD on hemodialysis ([place])    MWF [facility] [place]   Heart murmur    "slight" per ? Dr Deatra Ina 30 years ago. 2D ECHO  30 yearsa go.   History of blood transfusion    C- Section   History of bronchitis    numerous, last time> 1 year   Hypertension    Night muscle spasms    legs   Polycystic kidney disease    Genetic dx 23 years ago   PONV (postoperative nausea and vomiting)    patch helped   Scoliosis    Strabismus    Past Surgical History:  Past Surgical History:  Procedure Laterality Date   ABDOMINAL HYSTERECTOMY     AV FISTULA PLACEMENT  09/01/2011   Procedure: ARTERIOVENOUS (AV) FISTULA CREATION;  Surgeon: Rosetta Posner, MD;  Location: [place];  Service: Vascular;  Laterality: Right;   Halfway     for lazy eye   ORIF FEMUR FRACTURE Left 08/29/2021   Procedure: OPEN REDUCTION INTERNAL FIXATION (ORIF) DISTAL FEMUR FRACTURE;  Surgeon: Shona Needles, MD;  Location: [place];  Service: Orthopedics;  Laterality: Left;   ORIF WRIST FRACTURE Left 08/29/2021   Procedure: OPEN REDUCTION INTERNAL FIXATION (ORIF) WRIST FRACTURE;  Surgeon: Shona Needles, MD;  Location: [place];  Service: Orthopedics;  Laterality: Left;   TIBIA IM NAIL INSERTION Left 05/19/2018   Procedure: INTRAMEDULLARY (IM) NAIL TIBIAL;  Surgeon: Hiram Gash, MD;  Location: [place];  Service: Orthopedics;  Laterality: Left;   TIBIA IM NAIL INSERTION Right 08/29/2021   Procedure: INTRAMEDULLARY (IM) NAIL TIBIAL WITH IRRIGATION AND DEBRIDMENT;  Surgeon: Shona Needles, MD;  Location[place];  Service:  Orthopedics;  Laterality: Right;   Social History:  reports that she quit smoking about 6 years ago. Her smoking use included cigarettes. She started smoking about 41 years ago. She has a 45.00 pack-year smoking history. She quit smokeless tobacco use about 7 years ago. She reports that she does not drink alcohol and does not use drugs.  Family / Support Systems Marital Status: Widow/Widower How Long?: 3 years Patient Roles: Parent Spouse/Significant Other: Widowed Children: Sam Other Supports: N/A Anticipated Caregiver: Son sam Ability/Limitations of Caregiver: Kendra Clay is primary Scientist, water quality. He does not drive, and they rely on assistance from her brothers. She typically drives herself to/from dialysis. Caregiver Availability: 24/7 Family Dynamics: Pt and son live in the home together.  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 [employer] for 7 yrs until she quit due to dialysis Education: some college Health Literacy - How often do you need to have someone help you when you read instructions, pamphlets, or other written material from your doctor or pharmacy?: Never Writes: Yes Employment Status: Disabled Date Retired/Disabled/Unemployed: 2014 Public relations account executive Issues: Denies Guardian/Conservator: Denies   Abuse/Neglect Abuse/Neglect Assessment Can Be Completed: Yes Physical Abuse: Denies Verbal Abuse: Denies Sexual Abuse: Denies Exploitation of patient/patient's resources: Denies Self-Neglect: Denies  Patient response to: Social Isolation - How often do  you feel lonely or isolated from those around you?: Never  Emotional Status Pt's affect, behavior and adjustment status: Pt in good spirits at time of visit. Pt is eager to going home. Recent Psychosocial Issues: Denies Psychiatric History: Denies Substance Abuse History: Admits she quit smoking cigarettes 6 yrs ago. Denies  etoh/rec drug use.  Patient / Family Perceptions, Expectations & Goals Pt/Family understanding of illness & functional limitations: Pt and family have a general understanding of pt care needs. Pt son is familiar with her care needs due to previous hospitalizations. Premorbid pt/family roles/activities: Independent Anticipated changes in roles/activities/participation: Assistance with ADLs/IADLs Pt/family expectations/goals: Pt goal is to go home. She is unsure on what she would like to work on with therapy.  Community Resources [employer] Scripts: None Premorbid Home Care/DME Agencies: None Transportation available at discharge: TBD Is the patient able to respond to transportation needs?: Yes In the past 12 months, has lack of transportation kept you from medical appointments or from getting medications?: No In the past 12 months, has lack of transportation kept you from meetings, work, or from getting things needed for daily living?: No Resource referrals recommended: Neuropsychology  Discharge Planning Living Arrangements: Children Support Systems: Children, Other relatives Type of Residence: Private residence Insurance Resources: [employer] Financial Resources: [place] Referred: No Living Expenses: Own Money Management: Patient Does the patient have any problems obtaining your medications?: No Home Management: Pt reports she prepared most meals, and her son compelted housekeeping tasks. Patient/Family Preliminary Plans: TBD Care Coordinator Barriers to Discharge: Decreased caregiver support, Lack of/limited family support, Hemodialysis Care Coordinator Anticipated Follow Up Needs: HH/OP  Clinical Impression SW met with pt in room at bedside to introduce self, explain role, and discuss discharge process. Pt is not a English as a second language teacher. No HCPOA. DME: RW, rollator, cane, 3in1 BSC, and shower chair.   Kendra Clay A Mirah Nevins 09/10/2021, 3:22 PM

## 2021-09-10 NOTE — Progress Notes (Signed)
  [facility] Progress Note   Subjective:  Ended dialysis early yesterday. "Didn't feel good" Vomited once after, today feels better. Now in CIR.   Objective Vitals:   09/09/21 1724 09/09/21 1926 09/10/21 0415 09/10/21 0553  BP: (!) 147/71 (!) 144/71 (!) 159/80   Pulse: 91 90 95   Resp: 15 20 16    Temp: 98.6 F (37 C) 98 F (36.7 C) 98.5 F (36.9 C)   TempSrc: Oral Oral Oral   SpO2:  98% 99%   Weight: 70 kg   68.1 kg  Height: 5\' 3"  (1.6 m)      Physical Exam General: Well appearing woman, NAD. Room air Heart: RRR; no murmur Lungs: CTAB, no rales Abdomen: soft Extremities: No LE edema; RLE with bruising and 2 sutured incisions. L forearm bandaged Dialysis Access: RUE AVF + thrill    Additional Objective Labs: Basic Metabolic Panel: Recent Labs  Lab 09/06/21 0850 09/06/21 1632 09/09/21 0813  NA 128* 134* 125*  K 4.2 4.2 4.8  CL 91* 92* 88*  CO2 27 31 24   GLUCOSE 93 101* 91  BUN 41* 22* 86*  CREATININE 6.74* 3.89* 8.64*  CALCIUM 7.9* 8.5* 7.9*  PHOS 3.3  --  4.2    Liver Function Tests: Recent Labs  Lab 09/06/21 0850 09/09/21 0813  ALBUMIN 2.2* 2.2*    CBC: Recent Labs  Lab 09/04/21 1400 09/06/21 0850 09/09/21 0813  WBC 6.9 5.4 5.2  HGB 9.0* 8.7* 7.7*  HCT 27.5* 26.3* 24.6*  MCV 86.8 86.8 87.9  PLT 175 115* 111*    Medications:  anticoagulant sodium citrate      (feeding supplement) PROSource Plus  30 mL Oral BID BM   apixaban  2.5 mg Oral BID   atorvastatin  40 mg Oral QHS   Chlorhexidine Gluconate Cloth  6 each Topical Q0600   [START ON 09/16/2021] darbepoetin (ARANESP) injection - DIALYSIS  150 mcg Intravenous Q Mon-HD   docusate sodium  100 mg Oral BID   [START ON 09/11/2021] doxercalciferol  8 mcg Intravenous Q M,W,F-HD   ferric citrate  210 mg Oral TID WC   guaiFENesin  1,200 mg Oral BID   pantoprazole  40 mg Oral BID   [START ON 09/15/2021] Vitamin D (Ergocalciferol)  50,000 Units Oral Q7 days    Dialysis  Orders: [place] MWF  3h 81min  350/1.5   60.5kg  2/2 bath  P2  Hep 1200  RFA AVF - last Hb 9.5 on 7/26 - doxercalciferol 31mcg IV q HD - Mircera 45mcg IV q 4wks, last 7/24   Assessment/Plan: Fall w/ traumatic extremity Fx: S/p ORIF L distal femur Fx, ORIF L distal radius/ulnar Fx, IM nailing of R open tibial Fx on 08/29/21.  Per ortho. On Eliquis for DVT prophylaxis. Now in [place]: Hypoxic on admit. Finished course of molnupiravir + steroids. Off precautions. ESRD: Continue HD on MWF schedule - HD 8/9 HTN/volume: BP stable - prior OP meds on hold (losartan, isosorbide, coreg). Uses midodrine only prn with HD - will adjust orders. Weight much higher but does not appear overloaded, ? from metal plates in extremities. Anemia of ESRD: Hgb 7.7. Resume ESA with HD 8/9.  S/p 2U PRBCs on 7/27. Follow. Secondary hyperparathyroidism: CorrCa/Phos good. Continue Auryxia as binder + VDRA. Nutrition: Alb low, adding supplements for wound healing. CAD/HFrEF Dispo: CIR.     Lynnda Child PA-C [facility] 09/10/2021,12:52 PM

## 2021-09-10 NOTE — Plan of Care (Signed)
  Problem: RH Balance Goal: LTG: Patient will maintain dynamic sitting balance (OT) Description: LTG:  Patient will maintain dynamic sitting balance with assistance during activities of daily living (OT) Flowsheets (Taken 09/10/2021 1401) LTG: Pt will maintain dynamic sitting balance during ADLs with: Independent   Problem: RH Eating Goal: LTG Patient will perform eating w/assist, cues/equip (OT) Description: LTG: Patient will perform eating with assist, with/without cues using equipment (OT) Flowsheets (Taken 09/10/2021 1401) LTG: Pt will perform eating with assistance level of: Set up assist    Problem: RH Grooming Goal: LTG Patient will perform grooming w/assist,cues/equip (OT) Description: LTG: Patient will perform grooming with assist, with/without cues using equipment (OT) Flowsheets (Taken 09/10/2021 1401) LTG: Pt will perform grooming with assistance level of: Independent with assistive device    Problem: RH Bathing Goal: LTG Patient will bathe all body parts with assist levels (OT) Description: LTG: Patient will bathe all body parts with assist levels (OT) Flowsheets (Taken 09/10/2021 1401) LTG: Pt will perform bathing with assistance level/cueing: Supervision/Verbal cueing   Problem: RH Dressing Goal: LTG Patient will perform upper body dressing (OT) Description: LTG Patient will perform upper body dressing with assist, with/without cues (OT). Flowsheets (Taken 09/10/2021 1401) LTG: Pt will perform upper body dressing with assistance level of: Supervision/Verbal cueing Goal: LTG Patient will perform lower body dressing w/assist (OT) Description: LTG: Patient will perform lower body dressing with assist, with/without cues in positioning using equipment (OT) Flowsheets (Taken 09/10/2021 1401) LTG: Pt will perform lower body dressing with assistance level of: Minimal Assistance - Patient > 75%   Problem: RH Toileting Goal: LTG Patient will perform toileting task (3/3 steps) with  assistance level (OT) Description: LTG: Patient will perform toileting task (3/3 steps) with assistance level (OT)  Flowsheets (Taken 09/10/2021 1401) LTG: Pt will perform toileting task (3/3 steps) with assistance level: Supervision/Verbal cueing   Problem: RH Toilet Transfers Goal: LTG Patient will perform toilet transfers w/assist (OT) Description: LTG: Patient will perform toilet transfers with assist, with/without cues using equipment (OT) Flowsheets (Taken 09/10/2021 1401) LTG: Pt will perform toilet transfers with assistance level of: Supervision/Verbal cueing   Problem: RH Tub/Shower Transfers Goal: LTG Patient will perform tub/shower transfers w/assist (OT) Description: LTG: Patient will perform tub/shower transfers with assist, with/without cues using equipment (OT) Flowsheets (Taken 09/10/2021 1401) LTG: Pt will perform tub/shower stall transfers with assistance level of: Contact Guard/Touching assist

## 2021-09-10 NOTE — Progress Notes (Addendum)
Patient ID: Kendra Clay, female   DOB: [birth year], 61 y.o.   MRN: [id number]  SW met with pt in room to provide updates from team conference, and d/c date is 8/17. SW called pt son Sam while in room to share updates. When discuss family education later in the week. Reports he does not drive, so will need to speak with one of his uncles' to see if they can transport. SW informed pt will provide updates once available. Pt son intends to  follow-up with SW about family edu. Pt is also aware she needs to make contact with her dialysis coordinator on site to discuss transportation options. SW will explore if [facility] is an option.   SW updated Olivia Mackie Mounce/Dialysis Coord on d/c date.   SW spoke with Joy/[facility] [phone]) to discuss transportation options. Completed registration by phone, and pt final registration forms will be mailed to the home, and she will need to sign and return and come back to the office to bring the forms.   Loralee Pacas, MSW, [place] Office: [phone] Cell: [phone] Fax: [phone]

## 2021-09-10 NOTE — Progress Notes (Signed)
Occupational Therapy Assessment and Plan  Patient Details  Name: Kendra [facility] MRN: [id number] Date of Birth: 1960-03-12  OT Diagnosis: acute pain and muscle weakness (generalized) Rehab Potential: Rehab Potential (ACUTE ONLY): Good ELOS: 10-12 days   Today's Date: 09/10/2021 OT Individual Time: 7564-3329 OT Individual Time Calculation (min): 58 min     Hospital Problem: Principal Problem:   Multiple fractures of both lower extremities and ribs   Past Medical History:  Past Medical History:  Diagnosis Date   Complication of anesthesia    Enlarged liver    secondary to PKD   ESRD on hemodialysis ([place])    MWF [place]   Heart murmur    "slight" per ? Dr Deatra Ina 30 years ago. 2D ECHO  30 yearsa go.   History of blood transfusion    C- Section   History of bronchitis    numerous, last time> 1 year   Hypertension    Night muscle spasms    legs   Polycystic kidney disease    Genetic dx 23 years ago   PONV (postoperative nausea and vomiting)    patch helped   Scoliosis    Strabismus    Past Surgical History:  Past Surgical History:  Procedure Laterality Date   ABDOMINAL HYSTERECTOMY     AV FISTULA PLACEMENT  09/01/2011   Procedure: ARTERIOVENOUS (AV) FISTULA CREATION;  Surgeon: Rosetta Posner, MD;  Location: [facility];  Service: Vascular;  Laterality: Right;   [place]     for lazy eye   ORIF FEMUR FRACTURE Left 08/29/2021   Procedure: OPEN REDUCTION INTERNAL FIXATION (ORIF) DISTAL FEMUR FRACTURE;  Surgeon: Shona Needles, MD;  Location: [facility];  Service: Orthopedics;  Laterality: Left;   ORIF WRIST FRACTURE Left 08/29/2021   Procedure: OPEN REDUCTION INTERNAL FIXATION (ORIF) WRIST FRACTURE;  Surgeon: Shona Needles, MD;  Location: [place];  Service: Orthopedics;  Laterality: Left;   TIBIA IM NAIL INSERTION Left 05/19/2018   Procedure: INTRAMEDULLARY (IM) NAIL TIBIAL;  Surgeon: Hiram Gash, MD;  Location: [facility];  Service: Orthopedics;  Laterality:  Left;   TIBIA IM NAIL INSERTION Right 08/29/2021   Procedure: INTRAMEDULLARY (IM) NAIL TIBIAL WITH IRRIGATION AND DEBRIDMENT;  Surgeon: Shona Needles, MD;  Location: [place];  Service: Orthopedics;  Laterality: Right;    Assessment & Plan Clinical Impression: Patient is a 61 y.o. year old female with past medical history of ESRD on HD MWF, essential hypertension, coronary artery disease status post stent placement in 2017, dilated cardiomyopathy, chronic combined diastolic and systolic CHF, who initially presented to [facility] ED after a ground-level fall while walking up the stairs.  She incurred multiple fractures, to her left supracondylar distal femur fracture, right tibia shaft fracture, left distal radius and ulna fracture.  The patient underwent repair of those fractures on 08/29/21.  Patient transferred to CIR on 09/09/2021 .    Patient currently requires max with basic self-care skills secondary to muscle weakness and decreased sitting balance, decreased standing balance, decreased postural control, decreased balance strategies, and difficulty maintaining precautions.  Prior to hospitalization, patient could complete BADL with modified independent .  Patient will benefit from skilled intervention to increase independence with basic self-care skills prior to discharge home with care partner.  Anticipate patient will require 24 hour supervision and minimal physical assistance and follow up home health.  OT - End of Session Endurance Deficit: Yes Endurance Deficit Description: rest breaks within BADL tasks  OT Assessment Rehab Potential (ACUTE ONLY): Good OT Patient demonstrates impairments in the following area(s): Balance;Behavior;Endurance;Motor;Pain;Safety OT Basic ADL's Functional Problem(s): Eating;Grooming;Bathing;Dressing;Toileting OT Transfers Functional Problem(s): Toilet;Tub/Shower OT Additional Impairment(s): None OT Plan OT Intensity: Minimum of 1-2 x/day, 45 to 90 minutes OT  Frequency: 5 out of 7 days OT Duration/Estimated Length of Stay: 10-12 days OT Treatment/Interventions: Balance/vestibular training;Cognitive remediation/compensation;Community reintegration;Discharge planning;Disease mangement/prevention;DME/adaptive equipment instruction;Functional electrical stimulation;Functional mobility training;Neuromuscular re-education;Pain management;Patient/family education;Psychosocial support;Self Care/advanced ADL retraining;Skin care/wound managment;Splinting/orthotics;Therapeutic Activities;Therapeutic Exercise;UE/LE Strength taining/ROM;UE/LE Coordination activities;Visual/perceptual remediation/compensation;Wheelchair propulsion/positioning OT Self Feeding Anticipated Outcome(s): Set-up A OT Basic Self-Care Anticipated Outcome(s): Supervision/min A OT Toileting Anticipated Outcome(s): Supervision OT Bathroom Transfers Anticipated Outcome(s): Supervision OT Recommendation Patient destination: Home Follow Up Recommendations: Home health OT Equipment Recommended: To be determined   OT Evaluation Precautions/Restrictions  Precautions Precautions: Fall Other Brace: camboot on RLE with transfers/mobility only Restrictions Weight Bearing Restrictions: Yes LUE Weight Bearing: Non weight bearing (WBAT through wrist) RLE Weight Bearing: Weight bearing as tolerated LLE Weight Bearing: Weight bearing as tolerated Pain  5/10 pain, B LE;s and L wrist. Ice applied, rest and repositioned Home Living/Prior [place] expects to be discharged to:: Private residence Living Arrangements: Children Available Help at Discharge: Family, Available 24 hours/day Type of Home: House Home Access: Stairs to enter Technical brewer of Steps: 2 Entrance Stairs-Rails: None Home Layout: One level Bathroom Shower/Tub: Government social research officer Accessibility: Yes Additional Comments: Has a shoewr chiar, RW, and 3-in-1  BSC  Lives With: Family IADL History Leisure and Hobbies: Enojys reading and watching TV IADL Comments: Grocery shopping, cooking/cleaning, son would help with some iADLs depending on the day, drives herself to HD Prior Function Level of Independence: Independent with basic ADLs, Independent with homemaking with ambulation Vision Baseline Vision/History: 1 Wears glasses Ability to See in Adequate Light: 0 Adequate Patient Visual Report: No change from baseline Perception  Perception: Within Functional Limits Praxis   Cognition Cognition Overall Cognitive Status: Within Functional Limits for tasks assessed Arousal/Alertness: Awake/alert Orientation Level: Person;Place;Situation Person: Oriented Place: Oriented Situation: Oriented Memory: Appears intact Awareness: Appears intact Problem Solving: Appears intact Safety/Judgment: Appears intact Brief Interview for Mental Status (BIMS) Repetition of Three Words (First Attempt): 3 Temporal Orientation: Year: Correct Temporal Orientation: Month: Accurate within 5 days Temporal Orientation: Day: Correct Recall: "Sock": Yes, no cue required Recall: "Blue": Yes, no cue required Recall: "Bed": Yes, no cue required BIMS Summary Score: 15 Sensation Sensation Light Touch: Appears Intact Coordination Gross Motor Movements are Fluid and Coordinated: No Fine Motor Movements are Fluid and Coordinated: No Coordination and Movement Description: L hand limited ROM and swelling in fingers Motor  Motor Motor - Skilled Clinical Observations: limited by pain  Balance Static Sitting Balance Static Sitting - Balance Support: Feet supported Static Sitting - Level of Assistance: 5: Stand by assistance Dynamic Sitting Balance Dynamic Sitting - Balance Support: Feet supported Dynamic Sitting - Level of Assistance: 5: Stand by assistance Static Standing Balance Static Standing - Balance Support: Right upper extremity supported Static Standing -  Level of Assistance: 2: Max assist Extremity/Trunk Assessment RUE Assessment RUE Assessment: Within Functional Limits LUE Assessment LUE Assessment: Exceptions to [facility] General Strength Comments: Cast to wrist to forearm. Elbow and shoulder ROM limited due to pain LUE Body System: Ortho  Care Tool Care Tool Self Care Eating   Eating Assist Level: Minimal Assistance - Patient > 75%    Oral Care    Oral Care Assist Level: Minimal Assistance - Patient > 75%  Bathing   Body parts bathed by patient: Left arm;Chest;Abdomen;Face;Front perineal area Body parts bathed by helper: Right arm;Buttocks;Right upper leg;Right lower leg;Left upper leg;Left lower leg   Assist Level: Maximal Assistance - Patient 24 - 49%    Upper Body Dressing(including orthotics)   What is the patient wearing?: Pull over shirt   Assist Level: Maximal Assistance - Patient 25 - 49%    Lower Body Dressing (excluding footwear)   What is the patient wearing?: Pants;Incontinence brief Assist for lower body dressing: Total Assistance - Patient < 25%    Putting on/Taking off footwear     Assist for footwear: Dependent - Patient 0%       Care Tool Toileting Toileting activity Toileting Activity did not occur (Clothing management and hygiene only): N/A (no void or bm)       Care Tool Bed Mobility Roll left and right activity   Roll left and right assist level: Minimal Assistance - Patient > 75%    Sit to lying activity   Sit to lying assist level: Maximal Assistance - Patient 25 - 49%    Lying to sitting on side of bed activity   Lying to sitting on side of bed assist level: the ability to move from lying on the back to sitting on the side of the bed with no back support.: Moderate Assistance - Patient 50 - 74%     Care Tool Transfers Sit to stand transfer   Sit to stand assist level: Maximal Assistance - Patient 25 - 49%    Chair/bed transfer Chair/bed transfer activity did not occur: Safety/medical  concernsSafety/medical       Toilet transfer Toilet transfer activity did not occur: Safety/medical concerns       Care Tool Cognition  Expression of Ideas and Wants Expression of Ideas and Wants: 4. Without difficulty (complex and basic) - expresses complex messages without difficulty and with speech that is clear and easy to understand  Understanding Verbal and Non-Verbal Content Understanding Verbal and Non-Verbal Content: 4. Understands (complex and basic) - clear comprehension without cues or repetitions   Memory/Recall Ability Memory/Recall Ability : Current season;Location of own room;Staff names and faces;That he or she is in a hospital/hospital unit   Refer to Care Plan for [place] OT Short Term Goal 1 (Week 1): Patient will complete toilet transfer with mod A of 1 OT Short Term Goal 2 (Week 1): Patient will complete 1 step of LB dressing task OT Short Term Goal 3 (Week 1): Pt will tolerate standing at the sink with min A in preparation for BADL task  Recommendations for other services: None    Skilled Therapeutic Intervention OT eval completed addressing rehab process, OT purpose, POC, ELOS, and goals. Bed level and EOB BADLs completed as described in detail below. Attempted sit<>stand with max A of 1, but pt unable to achieve full hip/trunk extension and difficulty with anterior weight shift. Pt also limited by limited use of L UE and pain with mobility. Pt left semi-reclined in bed at end of session with bed alarm on, call bell in reach, and needs met.   ADL ADL Eating: Minimal assistance Grooming: Minimal assistance Upper Body Bathing: Moderate assistance Lower Body Bathing: Maximal assistance Upper Body Dressing: Moderate assistance Lower Body Dressing: Maximal assistance Toileting: Unable to assess Toilet Transfer: Unable to assess Mobility  Bed Mobility Bed Mobility: Rolling Right;Sit to Supine;Supine to Sit Rolling Right: Minimal  Assistance - Patient >  75% Supine to Sit: Moderate Assistance - Patient 50-74% Sit to Supine: Maximal Assistance - Patient 25-49%;Moderate Assistance - Patient 50-74% Transfers Sit to Stand: Maximal Assistance - Patient 25-49% Stand to Sit: Maximal Assistance - Patient 25-49%   Discharge Criteria: Patient will be discharged from OT if patient refuses treatment 3 consecutive times without medical reason, if treatment goals not met, if there is a change in medical status, if patient makes no progress towards goals or if patient is discharged from hospital.  The above assessment, treatment plan, treatment alternatives and goals were discussed and mutually agreed upon: by patient  Valma Cava 09/10/2021, 1:51 PM

## 2021-09-10 NOTE — Patient Care Conference (Signed)
Inpatient RehabilitationTeam Conference and Plan of Care Update Date: 09/10/2021   Time: 10:30 AM    Patient Name: Kendra [facility]      Medical Record Number: [id number]  Date of Birth: 1960-09-08 Sex: Female         Room/Bed: 4M07C/4M07C-01 Payor Info: Payor: MEDICARE / Plan: MEDICARE PART A AND B / Product Type: *No Product type* /    Admit Date/Time:  09/09/2021  4:26 PM  Primary Diagnosis:  Multiple fractures of both lower extremities and ribs  Hospital Problems: Principal Problem:   Multiple fractures of both lower extremities and ribs    Expected Discharge Date: Expected Discharge Date: 09/19/21  Team Members Present: Physician leading conference: Dr. Alger Simons Social Worker Present: Loralee Pacas, [place] Nurse Present: Other (comment) Tacy Learn, RN) PT Present: Ginnie Smart, PT OT Present: Cherylynn Ridges, OT PPS Coordinator present : Gunnar Fusi, SLP     Current Status/Progress Goal Weekly Team Focus  Bowel/Bladder   intermittent continence of bladder (oliguric; Dialysis pt). Incontinent of Bowel. LBM - 8/7  Gain continence of B/B  Offer toileting q 2 hrs & PRN   Swallow/Nutrition/ Hydration             ADL's   Max A LB ADLs, min/mod A for UB ADLs  Supervision goals  self-care retraining, activity tolerance, transfers, toileting, dc planning   Mobility      PT EVAL PENDING      Communication             Safety/Cognition/ Behavioral Observations            Pain   PRN Oxy available.  Pt states that pain goal is 2/10  Assess pain q shift & PRN   Skin   Multiple incisions in the bilateral lower extremities, incision lateral to the left knee, left lateral thigh, right lateral knee, cervix incisions at the lower right leg  Incisions well approximated. Bruising of right anterior lower leg. Bruising left AC from blood draws. Splint at the left wrist appears clean and dry. RUE AVF  Remain free from infection.  Routine skin care checks. Monitor  surgical incisions.     Discharge Planning:  To be assessed. Per EMR, pt to d/c to home with son who will provide 24/7 care. SW will confirm no barriers to discharge.   Team Discussion: Polytrauma. Self limiting behavior. WBAT to bilateral lower extremity. NWB through left wrist. Use of platform walker. Patient incontinent x 2 with LBM 08/08. HD M/W/F. PT has not evaluated patient.  Patient on target to meet rehab goals: N/A- PT eval pending  *See Care Plan and progress notes for long and short-term goals.   Revisions to Treatment Plan:  N/A  Teaching Needs: Medications, safety, wound/skin care, weightbearing restrictions, gait/transfer educations, etc.  Current Barriers to Discharge: Decreased caregiver support, Home enviroment access/layout, Incontinence, Wound care, Weight bearing restrictions, and Behavior  Possible Resolutions to Barriers: Family education, medication education, skin/wound care education, order recommended DME     Medical Summary Current Status: polytrauma, WBAT BLE, NWB thru left wrist. ESRD on HD, pain control issues  Barriers to Discharge: Medical stability   Possible Resolutions to [employer] Focus: daily assessment of pt data and labs, wouind care, pain control   Continued Need for Acute Rehabilitation Level of Care: The patient requires daily medical management by a physician with specialized training in physical medicine and rehabilitation for the following reasons: Direction of a multidisciplinary physical rehabilitation program to maximize functional  independence : Yes Medical management of patient stability for increased activity during participation in an intensive rehabilitation regime.: Yes Analysis of laboratory values and/or radiology reports with any subsequent need for medication adjustment and/or medical intervention. : Yes   I attest that I was present, lead the team conference, and concur with the assessment and plan of the  team.   Ernest Pine 09/10/2021, 3:19 PM

## 2021-09-10 NOTE — Evaluation (Signed)
Physical Therapy Assessment and Plan  Patient Details  Name: Kendra Clay MRN: [id number] Date of Birth: 10-25-1960  PT Diagnosis: Difficulty walking, Impaired sensation, Muscle weakness, and Pain in BLE and LUE Rehab Potential: Good ELOS: 10-14 days   Today's Date: 09/10/2021 PT Individual Time: 1031-1126 PT Individual Time Calculation (min): 55 min    Hospital Problem: Principal Problem:   Multiple fractures of both lower extremities and ribs   Past Medical History:  Past Medical History:  Diagnosis Date   Complication of anesthesia    Enlarged liver    secondary to PKD   ESRD on hemodialysis ([facility])    MWF [place]   Heart murmur    "slight" per ? Dr Deatra Ina 30 years ago. 2D ECHO  30 yearsa go.   History of blood transfusion    C- Section   History of bronchitis    numerous, last time> 1 year   Hypertension    Night muscle spasms    legs   Polycystic kidney disease    Genetic dx 23 years ago   PONV (postoperative nausea and vomiting)    patch helped   Scoliosis    Strabismus    Past Surgical History:  Past Surgical History:  Procedure Laterality Date   ABDOMINAL HYSTERECTOMY     AV FISTULA PLACEMENT  09/01/2011   Procedure: ARTERIOVENOUS (AV) FISTULA CREATION;  Surgeon: Rosetta Posner, MD;  Location: [facility];  Service: Vascular;  Laterality: Right;   [place]     for lazy eye   ORIF FEMUR FRACTURE Left 08/29/2021   Procedure: OPEN REDUCTION INTERNAL FIXATION (ORIF) DISTAL FEMUR FRACTURE;  Surgeon: Shona Needles, MD;  Location: [facility];  Service: Orthopedics;  Laterality: Left;   ORIF WRIST FRACTURE Left 08/29/2021   Procedure: OPEN REDUCTION INTERNAL FIXATION (ORIF) WRIST FRACTURE;  Surgeon: Shona Needles, MD;  Location: [facility];  Service: Orthopedics;  Laterality: Left;   TIBIA IM NAIL INSERTION Left 05/19/2018   Procedure: INTRAMEDULLARY (IM) NAIL TIBIAL;  Surgeon: Hiram Gash, MD;  Location: [facility];  Service: Orthopedics;  Laterality:  Left;   TIBIA IM NAIL INSERTION Right 08/29/2021   Procedure: INTRAMEDULLARY (IM) NAIL TIBIAL WITH IRRIGATION AND DEBRIDMENT;  Surgeon: Shona Needles, MD;  Location: [facility];  Service: Orthopedics;  Laterality: Right;    Assessment & Plan Clinical Impression: Patient is a 61 y.o. female who suffered a fall at home resulting in multiple fractures.  She has a history of end-stage renal disease due to polycystic kidney disease and is on chronic intermittent hemodialysis on Mondays, Wednesdays and Fridays.  On the morning of 08/29/2021, the patient was at home walking down steps to her garage with her cane, she lost her balance fell against a garbage can which slipped out from under her causing her to fall to the ground. She was brought to [facility] emergency department where imaging revealed right open tibial shaft fracture, left distal femur fracture and left distal radius and possible ulnar fracture.  Dr. Doreatha Martin was consulted and she was brought to the operating room later that same day and underwent irrigation debridement of right open tibial shaft fracture with intramedullary nailing.  She also underwent open reduction internal fixation of left distal femur fracture and ORIF of left distal radius fracture.  She tolerated the procedures well.  Nephrology was consulted.  She dialyzes RUE AV fistula.  She was diagnosed with COVID-19 infection and was treated withmolnupiravir and  prednisone.  She denies constipation. She denies any current respiratory symptoms. Her past medical history is significant for previous fall resulting in left tibial shaft fracture requiring IM nailing.  She has history of prior cigarette smoking, anemia, hypertension, hyperlipidemia, gastroesophageal reflux disease and moderate to severe emphysema. She is allowed to weight-bear as tolerated on both lower extremities and is nonweightbearing through the left wrist and weightbearing as tolerated to the left elbow.  Continue boot to the  right lower extremity and splint to the left upper extremity.  Maintain right lower extremity boot for therapy and mobilization but can come off while she is in the bed.  Eliquis initiated for DVT prophylaxis. The patient requires inpatient physical medicine and rehabilitation evaluations and treatment secondary to dysfunction due to multiple fractures.  Patient transferred to CIR on 09/09/2021 .   Patient currently requires max assist with mobility secondary to muscle weakness, decreased cardiorespiratoy endurance, ,, and decreased standing balance, decreased postural control, decreased balance strategies, difficulty maintaining precautions, and increased pain in BLE and LUE .  Prior to hospitalization, patient was modified independent  with mobility and lived with Family in a House home.  Home access is 2Stairs to enter.  Patient will benefit from skilled PT intervention to maximize safe functional mobility, minimize fall risk, and decrease caregiver burden for planned discharge home with 24 hour assist.  Anticipate patient will benefit from follow up [facility] at discharge.  PT - End of Session Activity Tolerance: Tolerates 10 - 20 min activity with multiple rests Endurance Deficit: Yes Endurance Deficit Description: rest breaks required during bed mobility most likely 2/2 pain/ weakness PT Assessment Rehab Potential (ACUTE/IP ONLY): Good PT Barriers to Discharge: [place] home environment;Decreased caregiver support;Home environment access/layout;IV antibiotics;Insurance for SNF coverage;Weight;Hemodialysis;Weight bearing restrictions;Medication compliance;Nutrition means PT Patient demonstrates impairments in the following area(s): Balance;Edema;Endurance;Motor;Pain;Nutrition;Perception;Safety;Sensory;Skin Integrity PT Transfers Functional Problem(s): Bed Mobility;Bed to Chair;Car;Furniture PT Locomotion Functional Problem(s): Ambulation;Wheelchair Mobility;Stairs PT Plan PT Intensity: Minimum of  1-2 x/day ,45 to 90 minutes PT Frequency: 5 out of 7 days PT Duration Estimated Length of Stay: 10-14 days PT Treatment/Interventions: Ambulation/gait training;Community reintegration;DME/adaptive equipment instruction;Neuromuscular re-education;Psychosocial support;Stair training;UE/LE Strength taining/ROM;Wheelchair propulsion/positioning;Balance/vestibular training;Discharge planning;Functional electrical stimulation;Therapeutic Activities;Skin care/wound management;Pain management;UE/LE Coordination activities;Cognitive remediation/compensation;Disease management/prevention;Functional mobility training;Patient/family education;Splinting/orthotics;Therapeutic Exercise;Visual/perceptual remediation/compensation PT Transfers Anticipated Outcome(s): supervision/ CGA PT Locomotion Anticipated Outcome(s): CGA PT Recommendation Follow Up Recommendations: Home health PT;Outpatient PT;24 hour supervision/assistance Patient destination: Home Equipment Recommended: To be determined   PT Evaluation Precautions/Restrictions Precautions Precautions: Fall;Other (comment) Required Braces or Orthoses: Other Brace Other Brace: CAM boot on RLE with transfers/ mobility/ ambulation Restrictions Weight Bearing Restrictions: Yes LUE Weight Bearing: Non weight bearing (WBAT thru L elbow only) RLE Weight Bearing: Weight bearing as tolerated LLE Weight Bearing: Weight bearing as tolerated General   Vital Signs Pain Pain Assessment Pain Scale: 0-10 Pain Score: 5  Pain Type: Acute pain;Surgical pain Pain Location: Leg Pain Orientation: Right;Left Pain Descriptors / Indicators: Aching;Discomfort;Sore Pain Onset: Gradual Patients Stated Pain Goal: 0 Pain Intervention(s): Medication (See eMAR);Cold applied;Repositioned Pain Interference Pain Interference Pain Effect on Sleep: 3. Frequently Pain Interference with Therapy Activities: 4. Almost constantly Pain Interference with Day-to-Day Activities: 4.  Almost constantly Home Living/Prior Functioning Home Living Living Arrangements: Children Available Help at Discharge: Family;Available 24 hours/day Type of Home: House Home Access: Stairs to enter [employer] of Steps: 2 Entrance Stairs-Rails: None Home Layout: One level Bathroom Shower/Tub: Chiropodist: Standard Bathroom Accessibility: Yes Additional Comments: Has a shower chair, RW, and 3-in-1 BSC  Lives With: Family Prior Function Level of Independence: Independent with basic ADLs;Independent with homemaking with ambulation  Able to Take Stairs?: Yes Vision/Perception  Vision - History Ability to See in Adequate Light: 0 Adequate Perception Perception: Within Functional Limits Praxis Praxis: Intact  Cognition Overall Cognitive Status: Within Functional Limits for tasks assessed Arousal/Alertness: Awake/alert Attention: Focused Focused Attention: Appears intact (affected by pain/ nausea) Memory: Appears intact Awareness: Appears intact Problem Solving: Appears intact Safety/Judgment: Appears intact Sensation Sensation Light Touch: Impaired by gross assessment (WFL for BUE; increased sensitivity in BLE) Coordination Gross Motor Movements are Fluid and Coordinated: No Fine Motor Movements are Fluid and Coordinated: No Coordination and Movement Description: L hand limited ROM and swelling in fingers Heel Shin Test: unable to perform 2/2 pain Motor  Motor Motor - Skilled Clinical Observations: severely limited by pain in BLE and nausea   Trunk/Postural Assessment  Cervical Assessment Cervical Assessment: Exceptions to [facility] (forward head) Thoracic Assessment Thoracic Assessment: Exceptions to [facility] (rounded shoulders with L protracted >R) Lumbar Assessment Lumbar Assessment: Exceptions to [facility] (posterior pelvic tilt) Postural Control Postural Control: Within Functional Limits  Balance Balance Balance Assessed: Yes Static Sitting  Balance Static Sitting - Balance Support: Feet supported Static Sitting - Level of Assistance: 5: Stand by assistance Dynamic Sitting Balance Dynamic Sitting - Balance Support: Feet supported Dynamic Sitting - Level of Assistance: 5: Stand by assistance;4: Min assist Extremity Assessment      RLE Assessment RLE Assessment: Exceptions to [facility] RLE Strength RLE Overall Strength: Deficits;Due to pain Right Hip Flexion: 3-/5 Right Hip Extension: 3-/5 Right Hip ABduction: 3-/5 Right Hip ADduction: 3-/5 Right Knee Flexion: 3-/5 Right Knee Extension: 3/5 Right Ankle Dorsiflexion: 2-/5 Right Ankle Plantar Flexion: 2-/5 LLE Assessment LLE Assessment: Exceptions to WFL LLE Strength LLE Overall Strength: Deficits;Due to pain Left Hip Flexion: 2-/5 Left Hip Extension: 2-/5 Left Hip ABduction: 2-/5 Left Hip ADduction: 2-/5 Left Knee Flexion: 3-/5 Left Knee Extension: 3-/5 Left Ankle Dorsiflexion: 3-/5 Left Ankle Plantar Flexion: 3-/5  Care Tool Care Tool Bed Mobility Roll left and right activity   Roll left and right assist level: Minimal Assistance - Patient > 75%    Sit to lying activity   Sit to lying assist level: Moderate Assistance - Patient 50 - 74%    Lying to sitting on side of bed activity   Lying to sitting on side of bed assist level: the ability to move from lying on the back to sitting on the side of the bed with no back support.: Moderate Assistance - Patient 50 - 74%     Care Tool Transfers Sit to stand transfer Sit to stand activity did not occur: Safety/medical concerns      Chair/bed transfer Chair/bed transfer activity did not occur: Safety/medical concerns       Toilet transfer Toilet transfer activity did not occur: Safety/medical concerns      Scientist, product/process development transfer activity did not occur: Safety/medical concerns        Care Tool Locomotion Ambulation Ambulation activity did not occur: Safety/medical concerns        Walk 10 feet activity  Walk 10 feet activity did not occur: Safety/medical concerns       Walk 50 feet with 2 turns activity Walk 50 feet with 2 turns activity did not occur: Safety/medical concerns      Walk 150 feet activity Walk 150 feet activity did not occur: Safety/medical concerns      Walk 10 feet on uneven surfaces activity  Walk 10 feet on uneven surfaces activity did not occur: Safety/medical concerns      Stairs Stair activity did not occur: Safety/medical concerns        Walk up/down 1 step activity Walk up/down 1 step or curb (drop down) activity did not occur: Safety/medical concerns      Walk up/down 4 steps activity Walk up/down 4 steps activity did not occur: Safety/medical concerns      Walk up/down 12 steps activity Walk up/down 12 steps activity did not occur: Safety/medical concerns      Pick up small objects from floor Pick up small object from the floor (from standing position) activity did not occur: Safety/medical concerns      Wheelchair Is the patient using a wheelchair?: No Type of Wheelchair: Manual Wheelchair activity did not occur: Safety/medical concerns      Wheel 50 feet with 2 turns activity Wheelchair 50 feet with 2 turns activity did not occur: Safety/medical concerns    Wheel 150 feet activity Wheelchair 150 feet activity did not occur: Safety/medical concerns      Refer to Care Plan for Long Term Goals  SHORT TERM GOAL WEEK 1 PT Short Term Goal 1 (Week 1): Pt will perform bed mobility with consistent supervision. PT Short Term Goal 2 (Week 1): Pt will perform sit<>stand with Mod A +1 PT Short Term Goal 3 (Week 1): Pt will initiate gait training. PT Short Term Goal 4 (Week 1): Pt will perform seat to seat transfers with Mod A +1.  Recommendations for other services: None   Skilled Therapeutic Intervention Mobility Bed Mobility Bed Mobility: Rolling Right;Sit to Supine;Supine to Sit Rolling Right: Contact Guard/Touching assist;Minimal Assistance -  Patient > 75% Supine to Sit: Minimal Assistance - Patient > 75%;Moderate Assistance - Patient 50-74% Sit to Supine: Minimal Assistance - Patient > 75%;Moderate Assistance - Patient 50-74% Locomotion  Gait Ambulation: No Gait Gait: No Stairs / Additional Locomotion Stairs: No Wheelchair Mobility Wheelchair Mobility: No  Skilled Interventions: PT Evaluation completed; see above for results. PT educated patient in roles of PT vs OT, PT POC, rehab potential, rehab goals, and discharge recommendations along with recommendation for follow-up rehabilitation services. Individual treatment initiated:  Patient supine in bed upon PT arrival. Patient alert and agreeable to PT session. Pain complaint at start of session is mild d/t minimal movement. During session pain increases with movement to >6/ 10.  Pt also coughing during session and relates that congestion is making her nauseous. Pt severely limited during eval for pain and nausea. Acquired and built L PFRW for pt. But unable to fit to pt d/t inability to perform transfers or stand on eval.   Therapeutic Activity: Bed Mobility: Patient performed supine to/from sit with Min/ ModA for bringing BLE toward EOB. Pt is is then able to complete with supervision/ CGA to reach seated position on EOB. To return to supine, pt required Min/ ModA to bring BLE back to bed surface. Max A for postioning on pillows. Extra time throughout for mental prep, pain, nausea. Requests to return to sit following 5-10 min seated EOB d/t  increase in nausea.  Provided verbal cues for effort.  Therapeutic Exercise: Patient performed the following supine exercises with verbal and tactile cues for proper technique. Provided AAROM prn. - BLE SLR with AROM to max active range, then PROM/ AAROM to max passive range.  - Bil heel slides within pain-free range - Bil ankle pumps - Bil SL hip abd/ add  Patient supine  in bed at end of session with brakes locked, bed alarm set, and  all needs within reach. Positioned BLE and LUE with pillows prior to exiting room.   Discharge Criteria: Patient will be discharged from PT if patient refuses treatment 3 consecutive times without medical reason, if treatment goals not met, if there is a change in medical status, if patient makes no progress towards goals or if patient is discharged from hospital.  The above assessment, treatment plan, treatment alternatives and goals were discussed and mutually agreed upon: by patient  Alger Simons PT, DPT, CSRS 09/10/2021, 6:04 PM

## 2021-09-10 NOTE — Progress Notes (Signed)
PROGRESS NOTE   Subjective/Complaints: Had a reasonable night. Has pain but says it's reasonably controlled.   ROS: Patient denies fever, rash, sore throat, blurred vision, dizziness, nausea, vomiting, diarrhea, cough, shortness of breath or chest pain,  headache, or mood change.    Objective:   No results found. Recent Labs    09/09/21 0813  WBC 5.2  HGB 7.7*  HCT 24.6*  PLT 111*   Recent Labs    09/09/21 0813  NA 125*  K 4.8  CL 88*  CO2 24  GLUCOSE 91  BUN 86*  CREATININE 8.64*  CALCIUM 7.9*    Intake/Output Summary (Last 24 hours) at 09/10/2021 3614 Last data filed at 09/10/2021 0730 Gross per 24 hour  Intake 520 ml  Output --  Net 520 ml        Physical Exam: Vital Signs Blood pressure (!) 159/80, pulse 95, temperature 98.5 F (36.9 C), temperature source Oral, resp. rate 16, height 5\' 3"  (1.6 m), weight 68.1 kg, SpO2 99 %.  General: Alert and oriented x 3, No apparent distress HEENT: Head is normocephalic, atraumatic, PERRLA, EOMI, sclera anicteric, oral mucosa pink and moist, dentition intact, ext ear canals clear,  Neck: Supple without JVD or lymphadenopathy Heart: Reg rate and rhythm. No murmurs rubs or gallops Chest: CTA bilaterally without wheezes, rales, or rhonchi; no distress Abdomen: Soft, non-tender, non-distended, bowel sounds positive. Extremities: No clubbing, cyanosis, or edema. Pulses are 2+ Psych: Pt's affect is appropriate. Pt is cooperative Skin: multiple incisions involving LE's all CDI. Scattered bruising. LUE in splint. Scattered bruising in UE's also. RUE AVF Neuro:  Alert and oriented x 3. Normal insight and awareness. Intact Memory. Normal language and speech. Cranial nerve exam unremarkable. Motor limited by pain in LE and LUE. Musculoskeletal: multifocal swelling along bilateral LE and LUE.     Assessment/Plan: 1. Functional deficits which require 3+ hours per day of  interdisciplinary therapy in a comprehensive inpatient rehab setting. Physiatrist is providing close team supervision and 24 hour management of active medical problems listed below. Physiatrist and rehab team continue to assess barriers to discharge/monitor patient progress toward functional and medical goals  Care Tool:  Bathing              Bathing assist       Upper Body Dressing/Undressing Upper body dressing        Upper body assist      Lower Body Dressing/Undressing Lower body dressing            Lower body assist       Toileting Toileting    Toileting assist Assist for toileting: Dependent - Patient 0%     Transfers Chair/bed transfer  Transfers assist  Chair/bed transfer activity did not occur: Safety/medical concerns        Locomotion Ambulation   Ambulation assist              Walk 10 feet activity   Assist           Walk 50 feet activity   Assist           Walk 150 feet activity   Assist  Walk 10 feet on uneven surface  activity   Assist           Wheelchair     Assist               Wheelchair 50 feet with 2 turns activity    Assist            Wheelchair 150 feet activity     Assist          Blood pressure (!) 159/80, pulse 95, temperature 98.5 F (36.9 C), temperature source Oral, resp. rate 16, height 5\' 3"  (1.6 m), weight 68.1 kg, SpO2 99 %.  Medical Problem List and Plan: 1. Functional deficits secondary to debility secondary to multiple fractures after fall             -patient may not shower             -ELOS/Goals: 5-7 days, PT/OT mod I           -Patient is beginning CIR therapies today including PT and OT  2.  Antithrombotics: -DVT/anticoagulation:  Pharmaceutical: Eliquis             -antiplatelet therapy: none 3. Pain Management: Tylenol, oxycodone as needed 4. Mood/Behavior/Sleep: LCSW to evaluate and provide emotional support              -antipsychotic agents: n/a 5. Neuropsych/cognition: This patient is capable of making decisions on her own behalf. 6. Skin/Wound Care: Routine skin care checks             -monitor surgical incisions 7. Fluids/Electrolytes/Nutrition: Strict Is and Os; nephrology following             -daily weights             -renal diet/1200 cc fluid restriction 8: Left femur fracture s/p ORIF: WBAT 9:Left distal radius and ulnar fracture s/p ORIF             -Nonweightbearing through the wrist, continue splint 10: Right open tibial fracture s/p IM nail:WBAT             -wear CAM boot with therapy/mobilization 11: ESRD on HD: M/W/F schedule per nephrology, she has a RUE AVF 12: CAD s/p BMS to LAD in 2017: Stable, denies chest pain             -follows with Dr. Otho Perl (last seen 05/2021) 13: Covid pneumonia: treated and off precautions, denies SOB             -continue FV; Mucinex  -sx appear much improved 14: Vitamin D deficiency: continue supplementation 15: Chronic heart failure (EF 40% in 2017): stable (normal left ventricular systolic function)             - Filed Weights   09/09/21 1724 09/10/21 0553  Weight: 70 kg 68.1 kg    16: Emphysema: FV/IS 17: Pancytopenia, improved, recheck labs with HD 18: Anemia: CKD/ABL s/p 2 units packed cells, Last HGB 7.7 on 8/7             -continue Aranesp, Auryxia per nephrology             -follow-up CBC with HD 19: GERD: continue Protonix 20: Hyperlipidemia: continue Lipitor 21: Labile BP: hydralazine 10 mg q 6 hours prn sys BP > 170             -midodrine 5 mg as needed only at the start of dialysis   -observe for next session, bp elevated  at baseline today  LOS: 1 days A FACE TO [place] 09/10/2021, 9:27 AM

## 2021-09-11 DIAGNOSIS — N186 End stage renal disease: Secondary | ICD-10-CM | POA: Diagnosis not present

## 2021-09-11 DIAGNOSIS — I5022 Chronic systolic (congestive) heart failure: Secondary | ICD-10-CM | POA: Diagnosis not present

## 2021-09-11 DIAGNOSIS — S2249XA Multiple fractures of ribs, unspecified side, initial encounter for closed fracture: Secondary | ICD-10-CM | POA: Diagnosis not present

## 2021-09-11 DIAGNOSIS — S8291XA Unspecified fracture of right lower leg, initial encounter for closed fracture: Secondary | ICD-10-CM | POA: Diagnosis not present

## 2021-09-11 LAB — CBC
HCT: 21.9 % — ABNORMAL LOW (ref 36.0–46.0)
Hemoglobin: 6.9 g/dL — CL (ref 12.0–15.0)
MCH: 27.4 pg (ref 26.0–34.0)
MCHC: 31.5 g/dL (ref 30.0–36.0)
MCV: 86.9 fL (ref 80.0–100.0)
Platelets: 113 10*3/uL — ABNORMAL LOW (ref 150–400)
RBC: 2.52 MIL/uL — ABNORMAL LOW (ref 3.87–5.11)
RDW: 15.3 % (ref 11.5–15.5)
WBC: 7.1 10*3/uL (ref 4.0–10.5)
nRBC: 0 % (ref 0.0–0.2)

## 2021-09-11 LAB — RENAL FUNCTION PANEL
Albumin: 2.1 g/dL — ABNORMAL LOW (ref 3.5–5.0)
Anion gap: 14 (ref 5–15)
BUN: 88 mg/dL — ABNORMAL HIGH (ref 6–20)
CO2: 24 mmol/L (ref 22–32)
Calcium: 7.8 mg/dL — ABNORMAL LOW (ref 8.9–10.3)
Chloride: 84 mmol/L — ABNORMAL LOW (ref 98–111)
Creatinine, Ser: 8.73 mg/dL — ABNORMAL HIGH (ref 0.44–1.00)
GFR, Estimated: 5 mL/min — ABNORMAL LOW (ref >60)
Glucose, Bld: 90 mg/dL (ref 70–99)
Phosphorus: 4.7 mg/dL — ABNORMAL HIGH (ref 2.5–4.6)
Potassium: 5.7 mmol/L — ABNORMAL HIGH (ref 3.5–5.1)
Sodium: 122 mmol/L — ABNORMAL LOW (ref 135–145)

## 2021-09-11 MED ORDER — HEPARIN SODIUM (PORCINE) 1000 UNIT/ML DIALYSIS: 1000.0000 [IU] | INTRAMUSCULAR | Status: DC | PRN

## 2021-09-11 MED ORDER — HEPARIN SODIUM (PORCINE) 1000 UNIT/ML DIALYSIS: 20.0000 [IU]/kg | Freq: Once | INTRAMUSCULAR | Status: DC

## 2021-09-11 MED ORDER — DARBEPOETIN ALFA 150 MCG/0.3ML IJ SOSY
150.0000 ug | PREFILLED_SYRINGE | INTRAMUSCULAR | Status: DC
  Administered 2021-09-11 – 2021-09-24 (×3): 150 ug via INTRAVENOUS
  Filled 2021-09-11 (×3): qty 0.3

## 2021-09-11 NOTE — Plan of Care (Signed)
  Problem: RH Balance Goal: LTG Patient will maintain dynamic standing balance (PT) Description: LTG:  Patient will maintain dynamic standing balance with assistance during mobility activities (PT) Flowsheets (Taken 09/10/2021 1208) LTG: Pt will maintain dynamic standing balance during mobility activities with:: Supervision/Verbal cueing   Problem: Sit to Stand Goal: LTG:  Patient will perform sit to stand with assistance level (PT) Description: LTG:  Patient will perform sit to stand with assistance level (PT) Flowsheets (Taken 09/10/2021 1208) LTG: PT will perform sit to stand in preparation for functional mobility with assistance level: Independent with assistive device   Problem: RH Bed Mobility Goal: LTG Patient will perform bed mobility with assist (PT) Description: LTG: Patient will perform bed mobility with assistance, with/without cues (PT). Flowsheets (Taken 09/10/2021 1208) LTG: Pt will perform bed mobility with assistance level of: Independent with assistive device    Problem: RH Bed to Chair Transfers Goal: LTG Patient will perform bed/chair transfers w/assist (PT) Description: LTG: Patient will perform bed to chair transfers with assistance (PT). Flowsheets (Taken 09/10/2021 1208) LTG: Pt will perform Bed to Chair Transfers with assistance level: Supervision/Verbal cueing   Problem: RH Car Transfers Goal: LTG Patient will perform car transfers with assist (PT) Description: LTG: Patient will perform car transfers with assistance (PT). Flowsheets (Taken 09/10/2021 1208) LTG: Pt will perform car transfers with assist:: Contact Guard/Touching assist   Problem: RH Furniture Transfers Goal: LTG Patient will perform furniture transfers w/assist (OT/PT) Description: LTG: Patient will perform furniture transfers  with assistance (OT/PT). Flowsheets (Taken 09/10/2021 1208) LTG: Pt will perform furniture transfers with assist:: Supervision/Verbal cueing   Problem: RH Ambulation Goal: LTG  Patient will ambulate in controlled environment (PT) Description: LTG: Patient will ambulate in a controlled environment, # of feet with assistance (PT). Flowsheets (Taken 09/10/2021 1208) LTG: Pt will ambulate in controlled environ  assist needed:: Contact Guard/Touching assist LTG: Ambulation distance in controlled environment: at least 150 ft using LRAD Goal: LTG Patient will ambulate in home environment (PT) Description: LTG: Patient will ambulate in home environment, # of feet with assistance (PT). Flowsheets (Taken 09/10/2021 1208) LTG: Pt will ambulate in home environ  assist needed:: Supervision/Verbal cueing LTG: Ambulation distance in home environment: at least 50 ft using LRAD   Problem: RH Wheelchair Mobility Goal: LTG Patient will propel w/c in home environment (PT) Description: LTG: Patient will propel wheelchair in home environment, # of feet with assistance (PT). Flowsheets (Taken 09/10/2021 1208) LTG: Pt will propel w/c in home environ  assist needed:: Independent with assistive device LTG: Propel w/c distance in home environment: 150 ft   Problem: RH Stairs Goal: LTG Patient will ambulate up and down stairs w/assist (PT) Description: LTG: Patient will ambulate up and down # of stairs with assistance (PT) Flowsheets (Taken 09/10/2021 1208) LTG: Pt will ambulate up/down stairs assist needed:: Contact Guard/Touching assist LTG: Pt will  ambulate up and down number of stairs: at least 2 steps using HR setup as per home environment

## 2021-09-11 NOTE — Progress Notes (Addendum)
Patient ID: Kendra Clay, female   DOB: July 29, 1960, 61 y.o.   MRN: [id number]  SW spoke with pt son Sam to discuss scheduling family education. Still working on transportation. Likely family edu to be next week.   SW met with pt in room to inform on [facility] packet that will be delivered to her home and she will need to physically take the forms to the office. Reports she will work on transportation services. She also repots she has not had a chance to contact her dialysis SW- Shavonne at [facility] location [phone]). SW spoke with Lockie Mola to discuss other options. Indicated she would need to utilize [facility] or can try a private company-[employer] [phone]). Reports when contact in [employer] to speak with Wells Guiles to discuss arranging transportation for dialysis; first shift beginning at 5:30am.   SW spoke with Wells Guiles at [place] to inform on referral that was already submitted. Reports she will speak with coordinator to discuss further. SW will follow-up to confirm if transportation services able to be in place.   Loralee Pacas, MSW, [place] Office: [phone] Cell: [phone] Fax: [phone]

## 2021-09-11 NOTE — Progress Notes (Signed)
Received patient in bed to unit with on going HD tx Alert and oriented.  Informed consent signed and in  chart.   Treatment initiated: 1557 Treatment completed: 1933  Patient not tolerated tx Transported back to the room  alert, without acute distress.  Hand-off given to patient's nurse.   Access used: AVF Access issues: none  Total UF removed: 91ml Medication(s) given: Hectorol 1mcg, Aranesp 149mcg Post HD VS: BP 112/64mmHg, HR 104bpm, Sats 95% on RA, RR 18 brpm, T 98.49F Post HD weight: 67.5kg   [facility]

## 2021-09-11 NOTE — Progress Notes (Signed)
PROGRESS NOTE   Subjective/Complaints: Had a reasonable night. Pain is reasonably controlled. Didn't do as well with her therapies as she hoped.   ROS: Patient denies fever, rash, sore throat, blurred vision, dizziness, nausea, vomiting, diarrhea, cough, shortness of breath or chest pain, joint or back/neck pain, headache, or mood change.    Objective:   No results found. Recent Labs    09/09/21 0813  WBC 5.2  HGB 7.7*  HCT 24.6*  PLT 111*   Recent Labs    09/09/21 0813  NA 125*  K 4.8  CL 88*  CO2 24  GLUCOSE 91  BUN 86*  CREATININE 8.64*  CALCIUM 7.9*    Intake/Output Summary (Last 24 hours) at 09/11/2021 0746 Last data filed at 09/11/2021 0738 Gross per 24 hour  Intake 1080 ml  Output --  Net 1080 ml        Physical Exam: Vital Signs Blood pressure 138/67, pulse (!) 102, temperature 98.5 F (36.9 C), resp. rate 18, height 5\' 3"  (1.6 m), weight 68.3 kg, SpO2 91 %.  Constitutional: No distress . Vital signs reviewed. HEENT: NCAT, EOMI, oral membranes moist Neck: supple Cardiovascular: RRR without murmur. No JVD    Respiratory/Chest: CTA Bilaterally without wheezes or rales. Normal effort    GI/Abdomen: BS +, non-tender, non-distended Ext: no clubbing, cyanosis, or edema Psych: pleasant and cooperative  Skin: multiple incisions involving LE's all CDI. Scattered bruising. LUE in splint. Scattered bruising in UE's also. RUE AVF---No real changes.  Neuro:  Alert and oriented x 3. Normal insight and awareness. Intact Memory. Normal language and speech. Cranial nerve exam unremarkable. Motor limited by pain in LE and LUE. Musculoskeletal: multifocal swelling along bilateral LE and LUE.     Assessment/Plan: 1. Functional deficits which require 3+ hours per day of interdisciplinary therapy in a comprehensive inpatient rehab setting. Physiatrist is providing close team supervision and 24 hour management of  active medical problems listed below. Physiatrist and rehab team continue to assess barriers to discharge/monitor patient progress toward functional and medical goals  Care Tool:  Bathing    Body parts bathed by patient: Left arm, Chest, Abdomen, Face, Front perineal area   Body parts bathed by helper: Right arm, Buttocks, Right upper leg, Right lower leg, Left upper leg, Left lower leg     Bathing assist Assist Level: Maximal Assistance - Patient 24 - 49%     Upper Body Dressing/Undressing Upper body dressing   What is the patient wearing?: Pull over shirt    Upper body assist Assist Level: Maximal Assistance - Patient 25 - 49%    Lower Body Dressing/Undressing Lower body dressing      What is the patient wearing?: Pants, Incontinence brief     Lower body assist Assist for lower body dressing: Total Assistance - Patient < 25%     Toileting Toileting Toileting Activity did not occur Landscape architect and hygiene only): N/A (no void or bm)  Toileting assist Assist for toileting: Dependent - Patient 0%     Transfers Chair/bed transfer  Transfers assist  Chair/bed transfer activity did not occur: Safety/medical concerns        Locomotion Ambulation  Ambulation assist   Ambulation activity did not occur: Safety/medical concerns          Walk 10 feet activity   Assist  Walk 10 feet activity did not occur: Safety/medical concerns        Walk 50 feet activity   Assist Walk 50 feet with 2 turns activity did not occur: Safety/medical concerns         Walk 150 feet activity   Assist Walk 150 feet activity did not occur: Safety/medical concerns         Walk 10 feet on uneven surface  activity   Assist Walk 10 feet on uneven surfaces activity did not occur: Safety/medical concerns         Wheelchair     Assist Is the patient using a wheelchair?: No Type of Wheelchair: Manual Wheelchair activity did not occur: Safety/medical  concerns         Wheelchair 50 feet with 2 turns activity    Assist    Wheelchair 50 feet with 2 turns activity did not occur: Safety/medical concerns       Wheelchair 150 feet activity     Assist  Wheelchair 150 feet activity did not occur: Safety/medical concerns       Blood pressure 138/67, pulse (!) 102, temperature 98.5 F (36.9 C), resp. rate 18, height 5\' 3"  (1.6 m), weight 68.3 kg, SpO2 91 %.  Medical Problem List and Plan: 1. Functional deficits secondary to debility secondary to multiple fractures after fall             -patient may not shower             -ELOS/Goals: 5-7 days, PT/OT mod I             -Continue CIR therapies including PT, OT  2.  Antithrombotics: -DVT/anticoagulation:  Pharmaceutical: Eliquis             -antiplatelet therapy: none 3. Pain Management: Tylenol, oxycodone as needed 4. Mood/Behavior/Sleep: LCSW to evaluate and provide emotional support             -antipsychotic agents: n/a 5. Neuropsych/cognition: This patient is capable of making decisions on her own behalf. 6. Skin/Wound Care: Routine skin care checks             -monitor surgical incisions..sutures out soon 7. Fluids/Electrolytes/Nutrition: Strict Is and Os; nephrology following             -daily weights             -renal diet/1200 cc fluid restriction--continues 8: Left femur fracture s/p ORIF: WBAT 9:Left distal radius and ulnar fracture s/p ORIF             -Nonweightbearing through the wrist, continue splint 10: Right open tibial fracture s/p IM nail:WBAT             -wear CAM boot with therapy/mobilization 11: ESRD on HD: M/W/F schedule per nephrology, she has a RUE AVF 12: CAD s/p BMS to LAD in 2017: Stable, denies chest pain             -follows with Dr. Otho Perl (last seen 05/2021) 13: Covid pneumonia: treated and off precautions, denies SOB             -continue FV; Mucinex  -sx  mproved 14: Vitamin D deficiency: continue supplementation 15: Chronic heart  failure (EF 40% in 2017): stable (normal left ventricular systolic function)  Filed Weights   09/09/21 1724 09/10/21 0553 09/11/21 0501  Weight: 70 kg 68.1 kg 68.3 kg    16: Emphysema: FV/IS 17: Pancytopenia, improved, recheck labs with HD 18: Anemia: CKD/ABL s/p 2 units packed cells, Last HGB 7.7 on 8/7             -continue Aranesp, Auryxia per nephrology             -follow-up CBC with HD 19: GERD: continue Protonix 20: Hyperlipidemia: continue Lipitor 21: Labile BP: hydralazine 10 mg q 6 hours prn sys BP > 170             -midodrine 5 mg as needed only at the start of dialysis   -observe for next session, bp elevated at baseline today  -BP better controlled, monitor HR   LOS: 2 days A FACE TO FACE EVALUATION WAS PERFORMED  Meredith Staggers 09/11/2021, 7:46 AM

## 2021-09-11 NOTE — Progress Notes (Signed)
   09/11/21 2055  Vitals  Temp 98.6 F (37 C)  Temp Source Oral  BP (!) 108/57  MAP (mmHg) 72  BP Location Left Arm  BP Method Automatic  Patient Position (if appropriate) Lying  Pulse Rate (!) 117  Pulse Rate Source Monitor  Resp 18  MEWS COLOR  MEWS Score Color Yellow  Oxygen Therapy  SpO2 95 %  O2 Device Room Air  Pain Assessment  Pain Scale 0-10  Pain Score 0  MEWS Score  MEWS Temp 0  MEWS Systolic 0  MEWS Pulse 2  MEWS RR 0  MEWS LOC 0  MEWS Score 2

## 2021-09-11 NOTE — Progress Notes (Signed)
Occupational Therapy Session Note  Patient Details  Name: Kendra Clay MRN: [id number] Date of Birth: 07-28-1960  Today's Date: 09/11/2021 Session 1: OT Individual Time: 2595-6387 OT Individual Time Calculation (min): 75 min   Session 2: OT Individual Time: 5643-3295 OT Individual Time Calculation (min): 30'  Missed Minutes: 50' (pt refused/ill. Not feeling well.)   Short Term Goals: Week 1:  OT Short Term Goal 1 (Week 1): Patient will complete toilet transfer with mod A of 1 OT Short Term Goal 2 (Week 1): Patient will complete 1 step of LB dressing task OT Short Term Goal 3 (Week 1): Pt will tolerate standing at the sink with min A in preparation for BADL task  Skilled Therapeutic Interventions/Progress Updates:    Session 1: Patient agreeable to participate in OT session. Reports 5/10 pain level.   Patient participated in skilled OT session focusing on activity tolerance, functional transfers, ADL re-training, and safety awareness while participating in sinkside ADL task. Therapist provided consistent education through session such as VC for safety awareness, sequencing, form during sit<>stands and static standing using PFRW. VC provided to flex hips during sit<>stand transition followed by engaging glutes to activate hip extension  in order to improve pt's functional performance during bed to chair and/or toilet transfers requiring less physical assistance from rehab staff. Pt required Max A when standing up from EOB with tactile cues give to anterior hips to initiate hip extension when appropriate. Once standing, pt required min assist for balance and for managing RW. Pt demonstrates increased difficulty with maintaining an upright standing posture and was unable to self correct with only VC provided. UB bathing completed seated at sink with Min assist provided due to LUE decreased strength, ROM , and coordination. LB bathing completed with total assist due to incontinent BM in which  patient completed sit<>Stand at sink using counter for balance/support. Pt was able to tolerate standing with support ~1 minute with VC provided to extend trunk to allow for better access. Pt with max difficulty with trunk extension. LB dressing completed with total assist.    Session 2: Patient agreeable to participate in OT session. Reports 5/10 pain level.   Patient participated in skilled OT session focusing on pain management techniques, HEP education, and bed positioning strategies. Therapist education patient on using pillows/blankets and bed function to provide BLE elevation with 2 ice packs on bilateral side of RLE in order to improve BLE pain and ROM and decrease pain level in order to increase ability to participate more in bed mobility while transitioning to seated EOB. Reviewed HEP provided by acute OT for A/ROM shoulder and finger exercises with patient verbalizing understanding. OT educated pt on available prescribed pain medication and time she is able to request more if needed. Pt verbalized understanding of all education. Pt unable to participate further in OT session with reports of feeling fatigued and ill.    Therapy Documentation Precautions:  Precautions Precautions: Fall, Other (comment) Required Braces or Orthoses: Other Brace Other Brace: CAM boot on RLE with transfers/ mobility/ ambulation Restrictions Weight Bearing Restrictions: Yes LUE Weight Bearing: Non weight bearing (wrist; okay to WB thru elbow) RLE Weight Bearing: Weight bearing as tolerated LLE Weight Bearing: Weight bearing as tolerated   Therapy/Group: Individual Therapy  Ailene Ravel, OTR/L,CBIS  Supplemental OT - [facility]  09/11/2021, 8:01 AM

## 2021-09-11 NOTE — Progress Notes (Signed)
Received a call from [place] reporting Kendra Clay returned from hemodialysis with tachycardia. She was instructed to check apical pulse. She states this occurred when she had last dialysis as well. Check vitals in one hour then Q 4 hours. She verbalizes understanding.

## 2021-09-11 NOTE — Progress Notes (Signed)
  [facility] Progress Note   Subjective:  Spent most of the day lying in bed. No specific complaints. For dialysis today.    Objective Vitals:   09/10/21 1306 09/10/21 1824 09/11/21 0501 09/11/21 1307  BP: (!) 153/77 (!) 141/72 138/67 (!) 156/72  Pulse: 96 99 (!) 102 93  Resp: 18 15 18 14   Temp: 98.3 F (36.8 C) 99 F (37.2 C) 98.5 F (36.9 C) 98.6 F (37 C)  TempSrc:  Oral  Oral  SpO2: 100% 94% 91% 97%  Weight:   68.3 kg   Height:       Physical Exam General: Well appearing woman, NAD. Room air Heart: RRR; no murmur Lungs: CTAB, no rales Abdomen: soft Extremities: No LE edema; RLE with bruising and 2 sutured incisions. L forearm bandaged Dialysis Access: RUE AVF + thrill    Additional Objective Labs: Basic Metabolic Panel: Recent Labs  Lab 09/06/21 0850 09/06/21 1632 09/09/21 0813  NA 128* 134* 125*  K 4.2 4.2 4.8  CL 91* 92* 88*  CO2 27 31 24   GLUCOSE 93 101* 91  BUN 41* 22* 86*  CREATININE 6.74* 3.89* 8.64*  CALCIUM 7.9* 8.5* 7.9*  PHOS 3.3  --  4.2    Liver Function Tests: Recent Labs  Lab 09/06/21 0850 09/09/21 0813  ALBUMIN 2.2* 2.2*    CBC: Recent Labs  Lab 09/06/21 0850 09/09/21 0813  WBC 5.4 5.2  HGB 8.7* 7.7*  HCT 26.3* 24.6*  MCV 86.8 87.9  PLT 115* 111*    Medications:  anticoagulant sodium citrate      (feeding supplement) PROSource Plus  30 mL Oral BID BM   apixaban  2.5 mg Oral BID   atorvastatin  40 mg Oral QHS   Chlorhexidine Gluconate Cloth  6 each Topical Q0600   [START ON 09/18/2021] darbepoetin (ARANESP) injection - DIALYSIS  150 mcg Intravenous Q Wed-HD   docusate sodium  100 mg Oral BID   doxercalciferol  8 mcg Intravenous Q M,W,F-HD   ferric citrate  210 mg Oral TID WC   pantoprazole  40 mg Oral BID   [START ON 09/15/2021] Vitamin D (Ergocalciferol)  50,000 Units Oral Q7 days    Dialysis Orders: [place] MWF  3h 74min  350/1.5   60.5kg  2/2 bath  P2  Hep 1200  RFA AVF - last Hb 9.5 on 7/26 -  doxercalciferol 73mcg IV q HD - Mircera 63mcg IV q 4wks, last 7/24   Assessment/Plan: Fall w/ traumatic extremity Fx: S/p ORIF L distal femur Fx, ORIF L distal radius/ulnar Fx, IM nailing of R open tibial Fx on 08/29/21.  Per ortho. On Eliquis for DVT prophylaxis. Now in [place]: Hypoxic on admit. Finished course of molnupiravir + steroids. Off precautions. ESRD: Continue HD on MWF schedule - HD today HTN/volume: BP stable - prior OP meds on hold (losartan, isosorbide, coreg). Uses midodrine only prn with HD - will adjust orders. Weight much higher but does not appear overloaded, ? from metal plates in extremities. Anemia of ESRD: Hgb 7.7. Resume ESA with HD 8/9.  S/p 2U PRBCs on 7/27. Follow. Secondary hyperparathyroidism: CorrCa/Phos good. Continue Auryxia as binder + VDRA. Nutrition: Alb low, adding supplements for wound healing. CAD/HFrEF Dispo: CIR.     Lynnda Child PA-C [facility] 09/11/2021,2:31 PM

## 2021-09-11 NOTE — IPOC Note (Signed)
Overall Plan of Care [facility]) Patient Details Name: Kendra Clay MRN: [id number] DOB: 1960/08/08  Admitting Diagnosis: Multiple fractures of both lower extremities and ribs  Hospital Problems: Principal Problem:   Multiple fractures of both lower extremities and ribs     Functional Problem List: Nursing Nutrition, Bladder, Bowel, Pain, Edema, Safety, Endurance, Sensory, Medication Management, Skin Integrity  PT Balance, Edema, Endurance, Motor, Pain, Nutrition, Perception, Safety, Sensory, Skin Integrity  OT Balance, Behavior, Endurance, Motor, Pain, Safety  SLP    TR         Basic ADL's: OT Eating, Grooming, Bathing, Dressing, Toileting     Advanced  ADL's: OT       Transfers: PT Bed Mobility, Bed to Chair, Car, Manufacturing systems engineer, Metallurgist: PT Ambulation, Emergency planning/management officer, Stairs     Additional Impairments: OT None  SLP        TR      Anticipated Outcomes Item Anticipated Outcome  Self Feeding Set-up A  Swallowing      Basic self-care  Supervision/min A  Toileting  Supervision   Bathroom Transfers Supervision  Bowel/Bladder  cont x 2 LBM 08/01  Transfers  supervision/ CGA  Locomotion  CGA  Communication     Cognition     Pain  less than 3  Safety/Judgment  remain fall free while in rehab   Therapy Plan: PT Intensity: Minimum of 1-2 x/day ,45 to 90 minutes PT Frequency: 5 out of 7 days PT Duration Estimated Length of Stay: 10-14 days OT Intensity: Minimum of 1-2 x/day, 45 to 90 minutes OT Frequency: 5 out of 7 days OT Duration/Estimated Length of Stay: 10-12 days     Team Interventions: Nursing Interventions Patient/Family Education, Disease Management/Prevention, Skin Care/Wound Management, Discharge Planning, Bladder Management, Pain Management, Cognitive Remediation/Compensation, Bowel Management, Medication Management  PT interventions Ambulation/gait training, Community reintegration, DME/adaptive equipment  instruction, Neuromuscular re-education, Psychosocial support, Stair training, UE/LE Strength taining/ROM, Wheelchair propulsion/positioning, Training and development officer, Discharge planning, Functional electrical stimulation, Therapeutic Activities, Skin care/wound management, Pain management, UE/LE Coordination activities, Cognitive remediation/compensation, Disease management/prevention, Functional mobility training, Patient/family education, Splinting/orthotics, Therapeutic Exercise, Visual/perceptual remediation/compensation  OT Interventions Training and development officer, Cognitive remediation/compensation, Community reintegration, Discharge planning, Disease mangement/prevention, DME/adaptive equipment instruction, Functional electrical stimulation, Functional mobility training, Neuromuscular re-education, Pain management, Patient/family education, Psychosocial support, Self Care/advanced ADL retraining, Skin care/wound managment, Splinting/orthotics, Therapeutic Activities, Therapeutic Exercise, UE/LE Strength taining/ROM, UE/LE Coordination activities, Visual/perceptual remediation/compensation, Wheelchair propulsion/positioning  SLP Interventions    TR Interventions    SW/CM Interventions Discharge Planning, Patient/Family Education, Psychosocial Support   Barriers to Discharge MD  Medical stability  Nursing Decreased caregiver support, Wound Care, Hemodialysis, Weight bearing restrictions    PT Inaccessible home environment, Decreased caregiver support, Home environment access/layout, IV antibiotics, Insurance for SNF coverage, Weight, Hemodialysis, Weight bearing restrictions, Medication compliance, Nutrition means    OT      SLP      SW Decreased caregiver support, Lack of/limited family support, Hemodialysis     Team Discharge Planning: Destination: PT-Home ,OT- Home , SLP-  Projected Follow-up: PT-Home health PT, Outpatient PT, 24 hour supervision/assistance, OT-  Home health OT,  SLP-  Projected Equipment Needs: PT-To be determined, OT- To be determined, SLP-  Equipment Details: PT- , OT-  Patient/family involved in discharge planning: PT- Patient,  OT-Patient, SLP-   MD ELOS: 10-12 days Medical Rehab Prognosis:  Excellent Assessment: The patient has been admitted for CIR therapies with the diagnosis of polytrauma. The team  will be addressing functional mobility, strength, stamina, balance, safety, adaptive techniques and equipment, self-care, bowel and bladder mgt, patient and caregiver education, pain mgt, ortho precautions, community reentry. Goals have been set at   supervision to [place]. Anticipated discharge destination is home.        See Team Conference Notes for weekly updates to the plan of care

## 2021-09-11 NOTE — Progress Notes (Signed)
Patient back from dialysis with vitals in the yellow MEWS. Patient denied any complaints and appeared comfortable in bed. Per patient, her heart rate was also up the last time she had dialysis. Notified Eunice of pulse rate 117 with apical at 110. Rechecked vitals in an hour, 98.8 108/58 108 (apical) 18 95% RA. Will follow MEWS protocol. Reported current vitals to [place]. Will continue to monitor.

## 2021-09-11 NOTE — Progress Notes (Signed)
Physical Therapy Session Note  Patient Details  Name: Kendra Clay MRN: [id number] Date of Birth: December 14, 1960  Today's Date: 09/11/2021 PT Individual Time: 0915-1015 PT Individual Time Calculation (min): 60 min   Short Term Goals: Week 1:  PT Short Term Goal 1 (Week 1): Pt will perform bed mobility with consistent supervision. PT Short Term Goal 2 (Week 1): Pt will perform sit<>stand with Mod A +1 PT Short Term Goal 3 (Week 1): Pt will initiate gait training. PT Short Term Goal 4 (Week 1): Pt will perform seat to seat transfers with Mod A +1.  Skilled Therapeutic Interventions/Progress Updates:     Pt received seated in WC and agrees to therapy, though mentions that she has been having some "GI distress" including diarrhea and nausea. Pt also reports pain in bilateral lower extremities and L lower extremity. PT provides rest breaks as needed to manage pain, as well as gentle mobility. WC transport to gym for time management. PT adjusts pt's L platform RW for optimal fit. Pt performs partial sit to stand to L platform RW with cues for hand placement and sequencing. Pt achieves ~75% of full stand with minA, but then states "that's it. I can't do anymore" and sits back down. Pt reports increase in nausea cause for inability to stand up. PT brings water and encourages pt to attempt again but pt requests to return to room. RN informed of nausea and brings anti-nausea medication for pt. Pt performs squat pivot/lateral scoot transfer to the L with modA/maxA and cues for anterior weight shift, body mechanics, and sequencing. Sit to supine with modA management of bilateral lower extremities. Pt is agreeable to performing supine therex for lower extremity strengthening.  Pt completes x15 ankle pumps, quad sets, and glute sets. Pt then performs 2x10 SAQs, SLRs, hip ab/adduction (with very limited range), and heel slides. PT provides verbal and tactile cueing for correct performance and AAROM when needed (for  SLR and hip abduction on L). Extended time required for all mobility for pain control. Pt left supine in bed with alarm intact and all needs within reach.  Therapy Documentation Precautions:  Precautions Precautions: Fall, Other (comment) Required Braces or Orthoses: Other Brace Other Brace: CAM boot on RLE with transfers/ mobility/ ambulation Restrictions Weight Bearing Restrictions: Yes LUE Weight Bearing: Non weight bearing (wrist; okay to WB thru elbow) RLE Weight Bearing: Weight bearing as tolerated LLE Weight Bearing: Weight bearing as tolerated  Therapy/Group: Individual Therapy  Breck Coons, PT, DPT 09/11/2021, 4:52 PM

## 2021-09-12 DIAGNOSIS — K5900 Constipation, unspecified: Secondary | ICD-10-CM

## 2021-09-12 DIAGNOSIS — S2249XA Multiple fractures of ribs, unspecified side, initial encounter for closed fracture: Secondary | ICD-10-CM | POA: Diagnosis not present

## 2021-09-12 DIAGNOSIS — D631 Anemia in chronic kidney disease: Secondary | ICD-10-CM

## 2021-09-12 DIAGNOSIS — D61818 Other pancytopenia: Secondary | ICD-10-CM | POA: Diagnosis not present

## 2021-09-12 DIAGNOSIS — N189 Chronic kidney disease, unspecified: Secondary | ICD-10-CM

## 2021-09-12 LAB — RENAL FUNCTION PANEL
Albumin: 2.1 g/dL — ABNORMAL LOW (ref 3.5–5.0)
Anion gap: 12 (ref 5–15)
BUN: 44 mg/dL — ABNORMAL HIGH (ref 6–20)
CO2: 27 mmol/L (ref 22–32)
Calcium: 8.3 mg/dL — ABNORMAL LOW (ref 8.9–10.3)
Chloride: 91 mmol/L — ABNORMAL LOW (ref 98–111)
Creatinine, Ser: 5.5 mg/dL — ABNORMAL HIGH (ref 0.44–1.00)
GFR, Estimated: 8 mL/min — ABNORMAL LOW (ref >60)
Glucose, Bld: 103 mg/dL — ABNORMAL HIGH (ref 70–99)
Phosphorus: 3.6 mg/dL (ref 2.5–4.6)
Potassium: 4 mmol/L (ref 3.5–5.1)
Sodium: 130 mmol/L — ABNORMAL LOW (ref 135–145)

## 2021-09-12 MED ORDER — GUAIFENESIN 200 MG PO TABS
200.0000 mg | ORAL_TABLET | Freq: Four times a day (QID) | ORAL | Status: DC
Start: 2021-09-12 — End: 2021-09-24
  Administered 2021-09-12 – 2021-09-24 (×45): 200 mg via ORAL
  Filled 2021-09-12 (×52): qty 1

## 2021-09-12 MED ORDER — GUAIFENESIN-DM 100-10 MG/5ML PO SYRP: 5.0000 mL | ORAL_SOLUTION | ORAL | Status: DC | PRN

## 2021-09-12 MED ORDER — ORAL CARE MOUTH RINSE: 15.0000 mL | OROMUCOSAL | Status: DC | PRN

## 2021-09-12 NOTE — Progress Notes (Signed)
PROGRESS NOTE   Subjective/Complaints: Reports pain fairly well controlled. Reports several loose BM.  Reports some continued mild chest congestion. She has been using zofran for intermittent nausea in the mornings.  LBM 8/9  ROS: Patient denies fever, rash, sore throat, blurred vision, dizziness, nausea, vomiting, diarrhea, cough, shortness of breath or chest pain, joint or back/neck pain, headache, or mood change.    Objective:   No results found. Recent Labs    09/11/21 1630  WBC 7.1  HGB 6.9*  HCT 21.9*  PLT 113*    Recent Labs    09/11/21 1630  NA 122*  K 5.7*  CL 84*  CO2 24  GLUCOSE 90  BUN 88*  CREATININE 8.73*  CALCIUM 7.8*     Intake/Output Summary (Last 24 hours) at 09/12/2021 1013 Last data filed at 09/12/2021 0700 Gross per 24 hour  Intake 70 ml  Output 900 ml  Net -830 ml         Physical Exam: Vital Signs Blood pressure 138/87, pulse (!) 110, temperature 98.1 F (36.7 C), temperature source Oral, resp. rate 17, height 5\' 3"  (1.6 m), weight 67.6 kg, SpO2 93 %.  Constitutional: No distress . Vital signs reviewed. HEENT: NCAT, conjugate gaze, oral membranes moist Neck: supple Cardiovascular: RRR without murmur. No JVD    Respiratory/Chest: CTA Bilaterally without wheezes or rales. Good air movement GI/Abdomen: BS +, non-tender, non-distended Ext: no clubbing, cyanosis, or edema Psych: pleasant and cooperative  Skin: multiple incisions involving LE's all CDI. Scattered bruising. LUE in splint. Scattered bruising in UE's also. RUE AVF---No real changes.  Neuro:  Alert and oriented x 3. Normal insight and awareness. Intact Memory. Normal language and speech. Cranial nerve exam unremarkable. Motor limited by pain in LE and LUE. Musculoskeletal: multifocal swelling along bilateral LE and LUE.     Assessment/Plan: 1. Functional deficits which require 3+ hours per day of interdisciplinary  therapy in a comprehensive inpatient rehab setting. Physiatrist is providing close team supervision and 24 hour management of active medical problems listed below. Physiatrist and rehab team continue to assess barriers to discharge/monitor patient progress toward functional and medical goals  Care Tool:  Bathing    Body parts bathed by patient: Left arm, Chest, Abdomen, Face, Front perineal area   Body parts bathed by helper: Right arm, Buttocks, Right upper leg, Right lower leg, Left upper leg, Left lower leg     Bathing assist Assist Level: Maximal Assistance - Patient 24 - 49%     Upper Body Dressing/Undressing Upper body dressing   What is the patient wearing?: Pull over shirt    Upper body assist Assist Level: Maximal Assistance - Patient 25 - 49%    Lower Body Dressing/Undressing Lower body dressing      What is the patient wearing?: Pants, Incontinence brief     Lower body assist Assist for lower body dressing: Total Assistance - Patient < 25%     Toileting Toileting Toileting Activity did not occur Landscape architect and hygiene only): N/A (no void or bm)  Toileting assist Assist for toileting: Dependent - Patient 0%     Transfers Chair/bed transfer  Transfers assist  Chair/bed transfer  activity did not occur: Safety/medical concerns        Locomotion Ambulation   Ambulation assist   Ambulation activity did not occur: Safety/medical concerns          Walk 10 feet activity   Assist  Walk 10 feet activity did not occur: Safety/medical concerns        Walk 50 feet activity   Assist Walk 50 feet with 2 turns activity did not occur: Safety/medical concerns         Walk 150 feet activity   Assist Walk 150 feet activity did not occur: Safety/medical concerns         Walk 10 feet on uneven surface  activity   Assist Walk 10 feet on uneven surfaces activity did not occur: Safety/medical concerns          Wheelchair     Assist Is the patient using a wheelchair?: Yes (Per PT long term goals) Type of Wheelchair: Manual Wheelchair activity did not occur: Safety/medical concerns         Wheelchair 50 feet with 2 turns activity    Assist    Wheelchair 50 feet with 2 turns activity did not occur: Safety/medical concerns       Wheelchair 150 feet activity     Assist  Wheelchair 150 feet activity did not occur: Safety/medical concerns       Blood pressure 138/87, pulse (!) 110, temperature 98.1 F (36.7 C), temperature source Oral, resp. rate 17, height 5\' 3"  (1.6 m), weight 67.6 kg, SpO2 93 %.  Medical Problem List and Plan: 1. Functional deficits secondary to debility secondary to multiple fractures after fall             -patient may not shower             -ELOS/Goals: 8/17, PT/OT mod I             -Continue CIR therapies including PT, OT  2.  Antithrombotics: -DVT/anticoagulation:  Pharmaceutical: Eliquis             -antiplatelet therapy: none 3. Pain Management: Tylenol, oxycodone as needed 4. Mood/Behavior/Sleep: LCSW to evaluate and provide emotional support             -antipsychotic agents: n/a 5. Neuropsych/cognition: This patient is capable of making decisions on her own behalf. 6. Skin/Wound Care: Routine skin care checks             -monitor surgical incisions..sutures out soon 7. Fluids/Electrolytes/Nutrition: Strict Is and Os; nephrology following             -daily weights             -renal diet/1200 cc fluid restriction--continues 8: Left femur fracture s/p ORIF: WBAT 9:Left distal radius and ulnar fracture s/p ORIF             -Nonweightbearing through the wrist, continue splint 10: Right open tibial fracture s/p IM nail:WBAT             -wear CAM boot with therapy/mobilization 11: ESRD on HD: M/W/F schedule per nephrology, she has a RUE AVF 12: CAD s/p BMS to LAD in 2017: Stable, denies chest pain             -follows with Dr. Otho Perl (last  seen 05/2021) 13: Covid pneumonia: treated and off precautions, denies SOB             -continue FV; Mucinex  -sx  improved  -8/10 Scheduled Mucinex  14: Vitamin D deficiency: continue supplementation 15: Chronic heart failure (EF 40% in 2017): stable (normal left ventricular systolic function)            Filed Weights   09/11/21 1557 09/11/21 1933 09/12/21 0500  Weight: 67.5 kg 67.5 kg 67.6 kg    16: Emphysema: FV/IS 17: Pancytopenia, improved  -WBC and PLT stable, HBG 6.9 8/9 18: Anemia: CKD/ABL s/p 2 units packed cells, Last HGB 7.7 on 8/7             -continue Aranesp, Auryxia per nephrology             -HGB 6.9 this AM, will reach out to nephrology regarding transfusion 19: GERD: continue Protonix 20: Hyperlipidemia: continue Lipitor 21: Labile BP: hydralazine 10 mg q 6 hours prn sys BP > 170             -midodrine 5 mg as needed only at the start of dialysis   -observe for next session, bp elevated at baseline today  -BP better controlled, monitor HR 22. Constipation  -Resolved after multiple BM 8/7 through 8/9   LOS: 3 days A FACE TO FACE EVALUATION WAS PERFORMED  Kendra Clay 09/12/2021, 10:13 AM

## 2021-09-12 NOTE — Progress Notes (Signed)
Physical Therapy Session Note  Patient Details  Name: Kendra Clay MRN: [id number] Date of Birth: 17-Oct-1960  Today's Date: 09/12/2021 PT Individual Time: 1015-1100 PT Individual Time Calculation (min): 45 min   Short Term Goals: Week 1:  PT Short Term Goal 1 (Week 1): Pt will perform bed mobility with consistent supervision. PT Short Term Goal 2 (Week 1): Pt will perform sit<>stand with Mod A +1 PT Short Term Goal 3 (Week 1): Pt will initiate gait training. PT Short Term Goal 4 (Week 1): Pt will perform seat to seat transfers with Mod A +1.  Skilled Therapeutic Interventions/Progress Updates:        Pt misses 15 minutes of skilled PT due to sleeping. Upon arousal, pt is agreeable to therapy and reports some pain in both legs, but number not provided. PT provides rest breaks and mobility to manage pain. Pt performs supine to sit with modA and cues for sequencing and positioning. Squat pivot transfer to the R with modA/maxA and cues for anterior weight shift, body mechanics, and sequencing. WC transport to gym for time management. Pt performs stand pivot to Nustep with maxA. PT cues pt to attempt steps during transfer but pt states that she cannot clear feet from floor. Pt also requires extended time and cueing for scoot to back of chair on nustep. Pt completes nustep for endurance training as well as increasing ROM and comfort in bilateral lower extremities. Pt tasked with completing 10:00 at workload of 4. Pt is able to complete x8:00 with frequent extended rest breaks and average steps per minute ~15. Pt verbalizes nausea before and during activity but does not show overt signs of nausea. Stand pivot back to WC with modA/maxA. Pt encouraged to remain sitting in WC for postural strengthening and pt is agreeable. Left seated with alarm intact and all need within reach.  Therapy Documentation Precautions:  Precautions Precautions: Fall, Other (comment) Required Braces or Orthoses: Other  Brace Other Brace: CAM boot on RLE with transfers/ mobility/ ambulation Restrictions Weight Bearing Restrictions: Yes LUE Weight Bearing: Non weight bearing RLE Weight Bearing: Weight bearing as tolerated LLE Weight Bearing: Weight bearing as tolerated General: PT Amount of Missed Time (min): 15 Minutes PT Missed Treatment Reason: Patient fatigue    Therapy/Group: Individual Therapy  Breck Coons, PT, DPT 09/12/2021, 5:28 PM

## 2021-09-12 NOTE — Progress Notes (Signed)
Occupational Therapy Session Note  Patient Details  Name: Kendra Clay MRN: [id number] Date of Birth: 1960/09/13  Today's Date: 09/12/2021 Session 1 OT Individual Time: 1791-5056 OT Individual Time Calculation (min): 70 min   Session 2 OT Individual Time: 1302-1400 OT Individual Time Calculation (min): 58 min   Short Term Goals: Week 1:  OT Short Term Goal 1 (Week 1): Patient will complete toilet transfer with mod A of 1 OT Short Term Goal 2 (Week 1): Patient will complete 1 step of LB dressing task OT Short Term Goal 3 (Week 1): Pt will tolerate standing at the sink with min A in preparation for BADL task  Skilled Therapeutic Interventions/Progress Updates:    Pt greeted semi-reclined in bed. Pt reported she was still nauseous this morning and needed encouragement to participate. Pt agreeable to bed level LB bathing/dressing. Pt noted to be incontinent of loose bowel and unaware. Worked on rolling in bed with min A and attempted hip bridges while cleaning peri-area and donning new brief. Pt only able to tolerate roughly 20 degrees of knee and ~10-20 degrees of hip flexion. Pt very resistant to working through more hip and knee flexion. With encouragement and cues, pt could bring B LE's to EOB, then elevate trunk using railings with min A. OT educated on use of reacher to thread pant legs, but pt reported feeling nauseous and unable to try. OT brought pt through deep breathing strategies. And she was able to attempt thread one pant leg, but became nauseous again and had to lay down. Pt returned to supine with min A. After extended rest break and with encouragement, pt came to sitting EOB again in similar fashion. Pt needed mod A to thread pant legs, then declined to try to stand to pull them up due to nausea. Lateral leans at EOB with max A to pull pants over hips. UB bathing/dressing at EOB with min A and set-up for supplies. Pt returned to bed with min A.  Pain:  5/10, B LE's,rest and  repositioned  Session 2 Pt greeted semi-reclined in bed. Pt initially stating she did not think she could work with OT due to nausea. Pt agreeable to trying nausea meds. Nursing entered and administered Zofran. OT took vitals as described in detail below. SUPINE:   HR:113  SpO2: 91-93%  BP: 110/54  Pt needed min A to advance LLE towards EOB and elevate trunk. Pt took extended rest break while seated EOB with vitals taken in sitting. CAM boot placed on R LE with max A from OT.  SITTING:  HR:118  SpO2: 98-100%  BP: 117/66  Pt eventually agreeable to stand and get to the recliner after OT re-iterated the importance of being upright for multiple reasons, but for one being to decrease risk of pneumonia. Educated on how lungs expand and circulatory system. Pt stood from EOB with PFRW and mod A. She then needed min A to pivot to the recliner. Pt reported nausea again after transfer to recliner. Pt needed extended rest break, then completed 1 more sit<>stand requring 2 trials and max A with PFRW. Pt refused to stay up in recliner and pivoted back to EOB with mod A. Worked on lateral scooting along EOB with mod A and mod A to bring LE's back in bed. Pt left semi-reclined in bed with call bell in reach, bed alarm on, and needs met.    Therapy Documentation Precautions:  Precautions Precautions: Fall, Other (comment) Required Braces or Orthoses: Other Brace Other  Brace: CAM boot on RLE with transfers/ mobility/ ambulation Restrictions Weight Bearing Restrictions: Yes LUE Weight Bearing: Non weight bearing RLE Weight Bearing: Weight bearing as tolerated LLE Weight Bearing: Weight bearing as tolerated Pain:  5/10, B LE's,rest and repositioned   Therapy/Group: Individual Therapy  Valma Cava 09/12/2021, 2:07 PM

## 2021-09-12 NOTE — Progress Notes (Addendum)
This patient is on Apixaban for VTE prophylaxis s/p surgery for:  1. ORIF of left distal femur fracture  2. I&D and intramedullary nailing of right open tibia fracture 3. Open reduction internal fixation of left distal radius and percutaneous fixation of left distal ulna  Upon discharge from acute admission, the note did not define a duration for the apixaban. I contacted Rushie Nyhan PA regarding duration ortho wanted.  She stated to continue the apixaban 2.5 mg po bid x30 days. This medication started on August 30, 2021. Changes to the current order were made to reflect this recommendation from orthopedics.   Thank you  Vaughan Basta BS, PharmD, BCPS Clinical Pharmacist 09/12/2021 8:12 AM  Contact: [phone] after 3 PM  "Be curious, not judgmental..." -Jamal Maes

## 2021-09-12 NOTE — Progress Notes (Signed)
[facility] Progress Note   Subjective:  Completed dialysis late last night. Hgb 6.9. Tachycardic. Denies chest pain, dyspnea.    Objective Vitals:   09/11/21 2310 09/12/21 0334 09/12/21 0500 09/12/21 0723  BP: (!) 106/58 110/62  138/87  Pulse: (!) 123 (!) 112  (!) 110  Resp: 19 19  17   Temp: 98.8 F (37.1 C) 98.3 F (36.8 C)  98.1 F (36.7 C)  TempSrc:    Oral  SpO2: 95% 94%  93%  Weight:   67.6 kg   Height:       Physical Exam General: Well appearing woman, NAD. Room air Heart: RRR; no murmur Lungs: CTAB, no rales Abdomen: soft Extremities: No LE edema; RLE with bruising and 2 sutured incisions. L forearm bandaged Dialysis Access: RUE AVF + thrill    Additional Objective Labs: Basic Metabolic Panel: Recent Labs  Lab 09/06/21 0850 09/06/21 1632 09/09/21 0813 09/11/21 1630  NA 128* 134* 125* 122*  K 4.2 4.2 4.8 5.7*  CL 91* 92* 88* 84*  CO2 27 31 24 24   GLUCOSE 93 101* 91 90  BUN 41* 22* 86* 88*  CREATININE 6.74* 3.89* 8.64* 8.73*  CALCIUM 7.9* 8.5* 7.9* 7.8*  PHOS 3.3  --  4.2 4.7*    Liver Function Tests: Recent Labs  Lab 09/06/21 0850 09/09/21 0813 09/11/21 1630  ALBUMIN 2.2* 2.2* 2.1*    CBC: Recent Labs  Lab 09/06/21 0850 09/09/21 0813 09/11/21 1630  WBC 5.4 5.2 7.1  HGB 8.7* 7.7* 6.9*  HCT 26.3* 24.6* 21.9*  MCV 86.8 87.9 86.9  PLT 115* 111* 113*    Medications:    (feeding supplement) PROSource Plus  30 mL Oral BID BM   apixaban  2.5 mg Oral BID   atorvastatin  40 mg Oral QHS   Chlorhexidine Gluconate Cloth  6 each Topical Q0600   darbepoetin (ARANESP) injection - DIALYSIS  150 mcg Intravenous Q Wed-HD   docusate sodium  100 mg Oral BID   doxercalciferol  8 mcg Intravenous Q M,W,F-HD   ferric citrate  210 mg Oral TID WC   guaiFENesin  200 mg Oral Q6H   pantoprazole  40 mg Oral BID   [START ON 09/15/2021] Vitamin D (Ergocalciferol)  50,000 Units Oral Q7 days    Dialysis Orders: [place] MWF  3h 53min   350/1.5   60.5kg  2/2 bath  P2  Hep 1200  RFA AVF - last Hb 9.5 on 7/26 - doxercalciferol 14mcg IV q HD - Mircera 98mcg IV q 4wks, last 7/24   Assessment/Plan: Fall w/ traumatic extremity Fx: S/p ORIF L distal femur Fx, ORIF L distal radius/ulnar Fx, IM nailing of R open tibial Fx on 08/29/21.  Per ortho. On Eliquis for DVT prophylaxis. Now in [place]: Hypoxic on admit. Finished course of molnupiravir + steroids. Off precautions. ESRD: Continue HD on MWF schedule - HD  8/11 Hyponatremia: Chronic, but worsening trend. Repeat today post HD.  HTN/volume: BP stable - prior OP meds on hold (losartan, isosorbide, coreg). Uses midodrine only prn with HD - will adjust orders. Weight much higher but does not appear overloaded, ? from metal plates in extremities. Anemia of ESRD: Hgb 7.7>6.9. Plan for transfusion with HD tomorrow.  Received Aranesp 150 on 8/9. S/p 2U PRBCs on 7/27. Follow. Secondary hyperparathyroidism: CorrCa/Phos good. Continue Auryxia as binder + VDRA. Nutrition: Alb low, adding supplements for wound healing. CAD/HFrEF Dispo: CIR.     Lynnda Child PA-C [facility] 09/12/2021,11:48  AM     

## 2021-09-13 DIAGNOSIS — I10 Essential (primary) hypertension: Secondary | ICD-10-CM

## 2021-09-13 DIAGNOSIS — R110 Nausea: Secondary | ICD-10-CM

## 2021-09-13 DIAGNOSIS — R638 Other symptoms and signs concerning food and fluid intake: Secondary | ICD-10-CM

## 2021-09-13 LAB — CBC
HCT: 22.3 % — ABNORMAL LOW (ref 36.0–46.0)
Hemoglobin: 7.2 g/dL — ABNORMAL LOW (ref 12.0–15.0)
MCH: 28.2 pg (ref 26.0–34.0)
MCHC: 32.3 g/dL (ref 30.0–36.0)
MCV: 87.5 fL (ref 80.0–100.0)
Platelets: 140 10*3/uL — ABNORMAL LOW (ref 150–400)
RBC: 2.55 MIL/uL — ABNORMAL LOW (ref 3.87–5.11)
RDW: 15.8 % — ABNORMAL HIGH (ref 11.5–15.5)
WBC: 6.8 10*3/uL (ref 4.0–10.5)
nRBC: 0 % (ref 0.0–0.2)

## 2021-09-13 LAB — RENAL FUNCTION PANEL
Albumin: 2.2 g/dL — ABNORMAL LOW (ref 3.5–5.0)
Anion gap: 12 (ref 5–15)
BUN: 50 mg/dL — ABNORMAL HIGH (ref 6–20)
CO2: 27 mmol/L (ref 22–32)
Calcium: 8.4 mg/dL — ABNORMAL LOW (ref 8.9–10.3)
Chloride: 88 mmol/L — ABNORMAL LOW (ref 98–111)
Creatinine, Ser: 6.61 mg/dL — ABNORMAL HIGH (ref 0.44–1.00)
GFR, Estimated: 7 mL/min — ABNORMAL LOW (ref >60)
Glucose, Bld: 110 mg/dL — ABNORMAL HIGH (ref 70–99)
Phosphorus: 3.6 mg/dL (ref 2.5–4.6)
Potassium: 4.1 mmol/L (ref 3.5–5.1)
Sodium: 127 mmol/L — ABNORMAL LOW (ref 135–145)

## 2021-09-13 LAB — PREPARE RBC (CROSSMATCH)

## 2021-09-13 MED ORDER — SODIUM CHLORIDE 0.9% IV SOLUTION
Freq: Once | INTRAVENOUS | Status: AC
Start: 2021-09-13 — End: 2021-09-13

## 2021-09-13 MED ORDER — RENA-VITE PO TABS
1.0000 | ORAL_TABLET | Freq: Every day | ORAL | Status: DC
  Administered 2021-09-13 – 2021-09-23 (×11): 1 via ORAL
  Filled 2021-09-13 (×11): qty 1

## 2021-09-13 MED ORDER — ENSURE ENLIVE PO LIQD
237.0000 mL | Freq: Two times a day (BID) | ORAL | Status: DC
  Administered 2021-09-13 – 2021-09-21 (×7): 237 mL via ORAL

## 2021-09-13 NOTE — Progress Notes (Signed)
[facility] Progress Note   Subjective:   Noted Hb 6.9. Planned for PRBC with HD today. Says her stool is dark and tarry but this is normal for her due to [place]. No other bleeding reported. Reports she is getting dizzy and tachycardic with sitting for the past 3 days. Denies CP and SOB. Noted some edema in her feet and L hand.   Objective Vitals:   09/12/21 1354 09/12/21 1931 09/13/21 0404 09/13/21 0500  BP: (!) 107/58 (!) 112/59 (!) 124/46   Pulse: (!) 108 (!) 101 95   Resp: 17 16 14    Temp: 98 F (36.7 C) 98.2 F (36.8 C) 98.4 F (36.9 C)   TempSrc:  Oral    SpO2: 92% 94% (!) 85%   Weight:    66.5 kg  Height:       Physical Exam General: Alert female in NAD Heart: RRR, no murmurs, rubs or gallops Lungs: CTA bilaterally without wheezing, rhonchi or rales Abdomen: Soft, non-distended, +BS Extremities: Trace pedal edema bilaterally Dialysis Access: RUE AVF + thrill  Additional Objective Labs: Basic Metabolic Panel: Recent Labs  Lab 09/09/21 0813 09/11/21 1630 09/12/21 1627  NA 125* 122* 130*  K 4.8 5.7* 4.0  CL 88* 84* 91*  CO2 24 24 27   GLUCOSE 91 90 103*  BUN 86* 88* 44*  CREATININE 8.64* 8.73* 5.50*  CALCIUM 7.9* 7.8* 8.3*  PHOS 4.2 4.7* 3.6   Liver Function Tests: Recent Labs  Lab 09/09/21 0813 09/11/21 1630 09/12/21 1627  ALBUMIN 2.2* 2.1* 2.1*   No results for input(s): "LIPASE", "AMYLASE" in the last 168 hours. CBC: Recent Labs  Lab 09/09/21 0813 09/11/21 1630  WBC 5.2 7.1  HGB 7.7* 6.9*  HCT 24.6* 21.9*  MCV 87.9 86.9  PLT 111* 113*   Blood Culture No results found for: "SDES", "SPECREQUEST", "CULT", "REPTSTATUS"  Cardiac Enzymes: No results for input(s): "CKTOTAL", "CKMB", "CKMBINDEX", "TROPONINI" in the last 168 hours. CBG: No results for input(s): "GLUCAP" in the last 168 hours. Iron Studies: No results for input(s): "IRON", "TIBC", "TRANSFERRIN", "FERRITIN" in the last 72 hours. @lablastinr3 @ Studies/Results: No  results found. Medications:   (feeding supplement) PROSource Plus  30 mL Oral BID BM   apixaban  2.5 mg Oral BID   atorvastatin  40 mg Oral QHS   Chlorhexidine Gluconate Cloth  6 each Topical Q0600   darbepoetin (ARANESP) injection - DIALYSIS  150 mcg Intravenous Q Wed-HD   docusate sodium  100 mg Oral BID   doxercalciferol  8 mcg Intravenous Q M,W,F-HD   ferric citrate  210 mg Oral TID WC   guaiFENesin  200 mg Oral Q6H   pantoprazole  40 mg Oral BID   [START ON 09/15/2021] Vitamin D (Ergocalciferol)  50,000 Units Oral Q7 days    Dialysis Orders: [place] MWF  3h 36min  350/1.5   60.5kg  2/2 bath  P2  Hep 1200  RFA AVF - last Hb 9.5 on 7/26 - doxercalciferol 64mcg IV q HD - Mircera 49mcg IV q 4wks, last 7/24    Assessment/Plan: Fall w/ traumatic extremity Fx: S/p ORIF L distal femur Fx, ORIF L distal radius/ulnar Fx, IM nailing of R open tibial Fx on 08/29/21.  Per ortho. On Eliquis for DVT prophylaxis. Now in CIR 2. COVID: Hypoxic on admit. Finished course of molnupiravir + steroids. Off precautions. 3. ESRD: Continue HD on MWF schedule - HD  8/11 Hyponatremia: Chronic, but worsening lately. Last Na improved. May be volume related, continue  UF with HD as tolerated.  HTN/volume: BP stable - prior OP meds on hold (losartan, isosorbide, coreg). Uses midodrine only prn with HD, orders adjusted. Having some orthostatic dizziness which correlates with onset of low hemoglobin. Does have some edema on exam, reestablishing EDW.  Anemia of ESRD: Hgb 7.7>6.9. Plan for transfusion with HD today.  Received Aranesp 150 on 8/9. S/p 2U PRBCs on 7/27. Reports dark stool, will order FOBT 7. Secondary hyperparathyroidism: CorrCa/Phos controlled. Continue VDRA and binder.   8. Nutrition: Alb low, on supplements for wound healing. 9. Dispo: CIR.   Anice Paganini, PA-C 09/13/2021, 10:01 AM  [facility] Pager: [phone]

## 2021-09-13 NOTE — Progress Notes (Signed)
Physical Therapy Session Note  Patient Details  Name: [name] MRN: [id number] Date of Birth: 10-10-1960  Today's Date: 09/13/2021 PT Individual Time: 2409-7353 PT Individual Time Calculation (min): 60 min  and Today's Date: 09/13/2021 PT Missed Time: 15 Minutes Missed Time Reason: Patient ill (Comment) (nausea/dizzy)  Short Term Goals: Week 1:  PT Short Term Goal 1 (Week 1): Pt will perform bed mobility with consistent supervision. PT Short Term Goal 2 (Week 1): Pt will perform sit<>stand with Mod A +1 PT Short Term Goal 3 (Week 1): Pt will initiate gait training. PT Short Term Goal 4 (Week 1): Pt will perform seat to seat transfers with Mod A +1.  Skilled Therapeutic Interventions/Progress Updates:    pt received in bed and agreeable to therapy. Pt states she cannot get out of bed at this time d/t nausea and low hemoglobin requiring transfusion later in the day. Pt reports 4/10 pain at rest, premedicated. Rest and positioning provided as needed.  After several exercises, pt agreeable to sitting EOB, supine>sit with CGA. After sitting EOB ~2 min, pt reports feeling suddenly dizzy. Recovered after resting in supine for several minutes. Max A sit>supine for BLE management d/t symptoms.   Pt performed the following bed level exercises to promote LE strength and endurance:  SLR 4 x 5 BIL, requires active assist for LLE Ankle pumps 3 x 20 with cues to push into blankets in PF as able for increased resistance. Heel slides 4 x 10  Supine hip abduction 4 x 10  Seated LAQ 2 x 10  After resting, pt reported not feeling able to continue with exercises, so returned to bed and was left with all needs in reach and alarm active. Ice packs in place for pain and edema management.   Pt missed 15 min d/t pain and nausea symptoms.   Therapy Documentation Precautions:  Precautions Precautions: Fall, Other (comment) Required Braces or Orthoses: Other Brace Other Brace: CAM boot on RLE with  transfers/ mobility/ ambulation Restrictions Weight Bearing Restrictions: Yes LUE Weight Bearing: Non weight bearing RLE Weight Bearing: Weight bearing as tolerated LLE Weight Bearing: Weight bearing as tolerated General: PT Amount of Missed Time (min): 15 Minutes PT Missed Treatment Reason: Patient ill (Comment) (nausea/dizzy)    Therapy/Group: Individual Therapy  Mickel Fuchs 09/13/2021, 10:53 AM

## 2021-09-13 NOTE — Progress Notes (Signed)
Occupational Therapy Session Note  Patient Details  Name: Kendra Clay MRN: [id number] Date of Birth: 01-24-1961  Today's Date: 09/13/2021 Session 1 OT Individual Time: 4944-9675 OT Individual Time Calculation (min): 70 min    Short Term Goals: Week 1:  OT Short Term Goal 1 (Week 1): Patient will complete toilet transfer with mod A of 1 OT Short Term Goal 2 (Week 1): Patient will complete 1 step of LB dressing task OT Short Term Goal 3 (Week 1): Pt will tolerate standing at the sink with min A in preparation for BADL task  Skilled Therapeutic Interventions/Progress Updates:    Pt greeted semi-reclined in bed, pt reported nausea was better, but once we sat up, she reported nausea again. Nursing administered pain and nausea meds. Pt tolerated sitting EOB for 3 minutes to don CAM boot, OT assist for lower straps, then pt able to help with top two straps. Pt had to return to supine to rest due to nausea. Pt expressed to OT that she wondered how much of this could be grief. Pt reported she lost her mother 3 weeks ago in this hospital. OT provided emotional support and encouragement. Pt very appreciative and agreeable to get to wc. Attempted slideboard transfer to wc with max A and difficulty with weight shifting. UB bathing/dressing from wc at the sink with education for use of L finger as a stabilizer and adapted dressing strategies. Sit<>stand at the sink with max A of 1, then +2 from nurse tech to then assist with washing buttocks and donning clean brief in standing. Tolerated 1 minute standing with facilitation for full hip and trunk extension. Max A to thread pants, then pt stood again with max A and OT to pull up pants. Grooming tasks completed from wc at the sink with set-up A. Pt declined to stay in wc and complete dsquat-pivot back to bed with max A and facilitation for pivot. Mod A to lift LE's back in bed. Pt left semi-reclined in bed with bed alarm on, call bell in reach, and needs met.    Therapy Documentation Precautions:  Precautions Precautions: Fall, Other (comment) Required Braces or Orthoses: Other Brace Other Brace: CAM boot on RLE with transfers/ mobility/ ambulation Restrictions Weight Bearing Restrictions: Yes LUE Weight Bearing: Non weight bearing RLE Weight Bearing: Weight bearing as tolerated LLE Weight Bearing: Weight bearing as tolerated  Pain: Pain Assessment Pain Scale: 0-10 Pain Score: 5  Pain Type: Acute pain Pain Location: Leg Pain Orientation: Right Pain Descriptors / Indicators: Aching Pain Frequency: Intermittent Pain Onset: On-going Patients Stated Pain Goal: 2 Pain Intervention(s): Medication (See eMAR) Multiple Pain Sites: No ADL:    Therapy/Group: Individual Therapy  Valma Cava 09/13/2021, 8:41 AM

## 2021-09-13 NOTE — Progress Notes (Signed)
PROGRESS NOTE   Subjective/Complaints: She has just taken zofran for nausea this AM. She does get dizzy when sitting up, she is scheduled for PRBC today during HD.  Tachycardia has been a little improved overall.    LBM 8/10  ROS: Patient denies fever, rash, sore throat, blurred vision, dizziness, vomiting, diarrhea, cough, shortness of breath or chest pain, joint or back/neck pain, headache, or mood change.  + nausea   Objective:   No results found. Recent Labs    09/11/21 1630  WBC 7.1  HGB 6.9*  HCT 21.9*  PLT 113*    Recent Labs    09/11/21 1630 09/12/21 1627  NA 122* 130*  K 5.7* 4.0  CL 84* 91*  CO2 24 27  GLUCOSE 90 103*  BUN 88* 44*  CREATININE 8.73* 5.50*  CALCIUM 7.8* 8.3*     Intake/Output Summary (Last 24 hours) at 09/13/2021 1031 Last data filed at 09/12/2021 1817 Gross per 24 hour  Intake 140 ml  Output --  Net 140 ml         Physical Exam: Vital Signs Blood pressure (!) 124/46, pulse 95, temperature 98.4 F (36.9 C), resp. rate 14, height 5\' 3"  (1.6 m), weight 66.5 kg, SpO2 (!) 85 %.  Constitutional: No distress . Vital signs reviewed. HEENT: NCAT, conjugate gaze, oral membranes moist Neck: supple Cardiovascular: RRR without murmur. No JVD    Respiratory/Chest: CTA Bilaterally without wheezes or rales. Good air movement GI/Abdomen: BS +, non-tender, non-distended,soft Ext: no clubbing, cyanosis, or edema Psych: pleasant and cooperative  Skin: multiple incisions involving LE's all CDI. Scattered bruising. LUE in splint. Scattered bruising in UE's also. RUE AVF---No real changes.  Neuro:  Alert and oriented x 3. Normal insight and awareness. Intact Memory. Follows commands. Normal language and speech. Cranial nerve exam unremarkable. Motor limited by pain in LE and LUE. Musculoskeletal: multifocal swelling along bilateral LE and LUE.     Assessment/Plan: 1. Functional deficits  which require 3+ hours per day of interdisciplinary therapy in a comprehensive inpatient rehab setting. Physiatrist is providing close team supervision and 24 hour management of active medical problems listed below. Physiatrist and rehab team continue to assess barriers to discharge/monitor patient progress toward functional and medical goals  Care Tool:  Bathing    Body parts bathed by patient: Left arm, Chest, Abdomen, Face, Front perineal area   Body parts bathed by helper: Right arm, Buttocks, Right upper leg, Right lower leg, Left upper leg, Left lower leg     Bathing assist Assist Level: Maximal Assistance - Patient 24 - 49%     Upper Body Dressing/Undressing Upper body dressing   What is the patient wearing?: Pull over shirt    Upper body assist Assist Level: Moderate Assistance - Patient 50 - 74%    Lower Body Dressing/Undressing Lower body dressing      What is the patient wearing?: Pants, Incontinence brief     Lower body assist Assist for lower body dressing: Maximal Assistance - Patient 25 - 49%     Toileting Toileting Toileting Activity did not occur (Clothing management and hygiene only): N/A (no void or bm)  Toileting assist Assist for  toileting: Dependent - Patient 0%     Transfers Chair/bed transfer  Transfers assist  Chair/bed transfer activity did not occur: Safety/medical concerns        Locomotion Ambulation   Ambulation assist   Ambulation activity did not occur: Safety/medical concerns          Walk 10 feet activity   Assist  Walk 10 feet activity did not occur: Safety/medical concerns        Walk 50 feet activity   Assist Walk 50 feet with 2 turns activity did not occur: Safety/medical concerns         Walk 150 feet activity   Assist Walk 150 feet activity did not occur: Safety/medical concerns         Walk 10 feet on uneven surface  activity   Assist Walk 10 feet on uneven surfaces activity did not  occur: Safety/medical concerns         Wheelchair     Assist Is the patient using a wheelchair?: Yes (Per PT long term goals) Type of Wheelchair: Manual Wheelchair activity did not occur: Safety/medical concerns         Wheelchair 50 feet with 2 turns activity    Assist    Wheelchair 50 feet with 2 turns activity did not occur: Safety/medical concerns       Wheelchair 150 feet activity     Assist  Wheelchair 150 feet activity did not occur: Safety/medical concerns       Blood pressure (!) 124/46, pulse 95, temperature 98.4 F (36.9 C), resp. rate 14, height 5\' 3"  (1.6 m), weight 66.5 kg, SpO2 (!) 85 %.  Medical Problem List and Plan: 1. Functional deficits secondary to debility secondary to multiple fractures after fall             -patient may not shower             -ELOS/Goals: 8/17, PT/OT mod I             -Continue CIR therapies including PT, OT  2.  Antithrombotics: -DVT/anticoagulation:  Pharmaceutical: Eliquis             -antiplatelet therapy: none 3. Pain Management: Tylenol, oxycodone as needed 4. Mood/Behavior/Sleep: LCSW to evaluate and provide emotional support             -antipsychotic agents: n/a 5. Neuropsych/cognition: This patient is capable of making decisions on her own behalf. 6. Skin/Wound Care: Routine skin care checks             -monitor surgical incisions..sutures out soon 7. Fluids/Electrolytes/Nutrition: Strict Is and Os; nephrology following             -daily weights             -renal diet/1200 cc fluid restriction--continues 8: Left femur fracture s/p ORIF: WBAT 9:Left distal radius and ulnar fracture s/p ORIF             -Nonweightbearing through the wrist, continue splint 10: Right open tibial fracture s/p IM nail:WBAT             -wear CAM boot with therapy/mobilization 11: ESRD on HD: M/W/F schedule per nephrology, she has a RUE AVF  -HD today 8/11 12: CAD s/p BMS to LAD in 2017: Stable, denies chest pain              -follows with Dr. Otho Perl (last seen 05/2021) 13: Covid pneumonia: treated and off precautions, denies SOB             -  continue FV; Mucinex  -sx  improved  -8/10 Scheduled Mucinex 14: Vitamin D deficiency: continue supplementation 15: Chronic heart failure (EF 40% in 2017): stable (normal left ventricular systolic function)            Filed Weights   09/11/21 1933 09/12/21 0500 09/13/21 0500  Weight: 67.5 kg 67.6 kg 66.5 kg    16: Emphysema: FV/IS 17: Pancytopenia, improved  -WBC and PLT stable, HBG 6.9 8/9 18: Anemia: CKD/ABL s/p 2 units packed cells, Last HGB 7.7 on 8/7             -continue Aranesp, Auryxia per nephrology             -HGB 6.9 8/9, Discussed with nephrology yesterday, will transfuse today with HD, FOBT ordered by nephrology 19: GERD: continue Protonix 20: Hyperlipidemia: continue Lipitor 21: Labile BP: hydralazine 10 mg q 6 hours prn sys BP > 170             -midodrine 5 mg as needed only at the start of dialysis   -observe for next session, bp elevated at baseline today  -BP a little soft but stable, HR a little improved, last recorded as 95  -Midodrine may be option if BP remains low and symptomatic 22. Constipation  -Resolved after multiple BM 8/7 through 8/10 23. Nausea  -Suspect related to dizziness related to anemia, transfusion planned for today 8/11. Hyponatremia could be contributing, although this is improved today and she has HD later.  Continue zofran 24. Decreased PO intake  -Dietician consult  -Continue prosource plus   LOS: 4 days A FACE TO FACE EVALUATION WAS PERFORMED  Jennye Boroughs 09/13/2021, 10:31 AM

## 2021-09-13 NOTE — Progress Notes (Signed)
Initial Nutrition Assessment  DOCUMENTATION CODES:   Not applicable  INTERVENTION:   Renal Multivitamin w/ minerals daily Liberalize pt diet to regular due to poor appetite Encourage good PO intake Discontinue ProSource Plus, replace with Ensure Enlive po BID, each supplement provides 350 kcal and 20 grams of protein.  NUTRITION DIAGNOSIS:   Increased nutrient needs related to acute illness (fractures) as evidenced by estimated needs.  GOAL:   Patient will meet greater than or equal to 90% of their needs  MONITOR:   PO intake, Labs, I & O's, Skin, Supplement acceptance  REASON FOR ASSESSMENT:   Consult Assessment of nutrition requirement/status, Poor PO  ASSESSMENT:   61 y.o. female admitted to CIR after multiple fractures from a fall at home. Pt required surgical intervention to R tibia, L femur, and L radius/ulna. PMH includes ESRD on HD, COPD, HLD, and HTN.   Pt unavailable at time of RD visit. Discussed with RN, reports that pt tends to eat a sandwich for most meals. RN reports that pt does not like the ProSource Plus supplement and is no longer accepting it.  Per review of pt orders, pt ordering 1-2 items per meal.   Per EMR, pt with no weight loss over the past year. Pt currently over her EDW.   Medications reviewed and include: Colace, Hectorol, Ferric citrate,  Protonix, Vitamin D Labs reviewed: Sodium 127  HD today: EDW - 60.5 kg  NUTRITION - FOCUSED PHYSICAL EXAM:  Deferred to follow-up.   Diet Order:   Diet Order             Diet regular Room service appropriate? Yes; Fluid consistency: Thin; Fluid restriction: 1200 mL Fluid  Diet effective now                   EDUCATION NEEDS:   No education needs have been identified at this time  Skin:  Skin Assessment: Reviewed RN Assessment  Last BM:  8/10  Height:   Ht Readings from Last 1 Encounters:  09/09/21 5\' 3"  (1.6 m)    Weight:   Wt Readings from Last 1 Encounters:  09/13/21 65.1  kg    Ideal Body Weight:  52.3 kg  BMI:  Body mass index is 25.42 kg/m.  Estimated Nutritional Needs:   Kcal:  1700-1900  Protein:  85-100  Fluid:  UOP + 1L    Hermina Barters RD, LDN Clinical Dietitian See Shea Evans for contact information.

## 2021-09-13 NOTE — Plan of Care (Signed)
  Problem: Consults Goal: RH GENERAL PATIENT EDUCATION Description: See Patient Education module for education specifics. Outcome: Progressing Goal: Skin Care Protocol Initiated - if Braden Score 18 or less Description: If consults are not indicated, leave blank or document N/A Outcome: Progressing Goal: Nutrition Consult-if indicated Outcome: Progressing   Problem: RH BOWEL ELIMINATION Goal: RH STG MANAGE BOWEL WITH ASSISTANCE Description: STG Manage Bowel with mod Assistance. Outcome: Progressing Goal: RH STG MANAGE BOWEL W/MEDICATION W/ASSISTANCE Description: STG Manage Bowel with Medication with min Assistance. Outcome: Progressing   Problem: RH BLADDER ELIMINATION Goal: RH STG MANAGE BLADDER WITH ASSISTANCE Description: STG Manage Bladder With mod Assistance Outcome: Progressing   Problem: RH SKIN INTEGRITY Goal: RH STG SKIN FREE OF INFECTION/BREAKDOWN Description: Skin will be free of breakdown/infection with min assist Outcome: Progressing Goal: RH STG MAINTAIN SKIN INTEGRITY WITH ASSISTANCE Description: STG Maintain Skin Integrity With min Assistance. Outcome: Progressing Goal: RH STG ABLE TO PERFORM INCISION/WOUND CARE W/ASSISTANCE Description: STG Able To Perform Incision/Wound Care With mod Assistance. Outcome: Progressing   Problem: RH SAFETY Goal: RH STG ADHERE TO SAFETY PRECAUTIONS W/ASSISTANCE/DEVICE Description: STG Adhere to Safety Precautions With min Assistance/Device. Outcome: Progressing   Problem: RH PAIN MANAGEMENT Goal: RH STG PAIN MANAGED AT OR BELOW PT'S PAIN GOAL Description: Pain will be managed at 3 out of 10 on pain scale Outcome: Progressing   Problem: RH KNOWLEDGE DEFICIT GENERAL Goal: RH STG INCREASE KNOWLEDGE OF SELF CARE AFTER HOSPITALIZATION Description: Patient/ caregiver will be able to verbalize and perform self care from education provided by nursing and therapies, as well as handouts.  Outcome: Progressing

## 2021-09-13 NOTE — Progress Notes (Signed)
Occupational Therapy Session Note  Patient Details  Name: Kendra Clay MRN: [id number] Date of Birth: 20-Sep-1960  Today's Date: 09/13/2021 OT Individual Time: 1100-1130 OT Individual Time Calculation (min): 30 min    Short Term Goals: Week 1:  OT Short Term Goal 1 (Week 1): Patient will complete toilet transfer with mod A of 1 OT Short Term Goal 2 (Week 1): Patient will complete 1 step of LB dressing task OT Short Term Goal 3 (Week 1): Pt will tolerate standing at the sink with min A in preparation for BADL task  Skilled Therapeutic Interventions/Progress Updates:    Pt greeted semi-reclined in bed with friend present. Pt reported continued nausea and declined to participate. OT provided encouragement and pt agreeable to L finger exercises. 3 sets of 10 Isolated PIP and DIP flex/ext, thumb opposition. OT applied kinesiotape for edema management to digits 1-5. Pt declined further activity and left semi-reclined in bed with bed alarm on, call bell in reach, and needs met.   Therapy Documentation Precautions:  Precautions Precautions: Fall, Other (comment) Required Braces or Orthoses: Other Brace Other Brace: CAM boot on RLE with transfers/ mobility/ ambulation Restrictions Weight Bearing Restrictions: Yes LUE Weight Bearing: Non weight bearing RLE Weight Bearing: Weight bearing as tolerated LLE Weight Bearing: Weight bearing as tolerated General: General OT Amount of Missed Time: 30 Minutes PT Missed Treatment Reason: Patient ill (Comment) (nausea/dizzy) Pain:  Pt reported R ankle pain, no number given. Rest and repositioned.   Therapy/Group: Individual Therapy  Valma Cava 09/13/2021, 1:26 PM

## 2021-09-14 LAB — TYPE AND SCREEN
ABO/RH(D): O POS
Antibody Screen: POSITIVE
DAT, IgG: NEGATIVE
Donor AG Type: NEGATIVE
Unit division: 0

## 2021-09-14 LAB — BPAM RBC
Blood Product Expiration Date: 202308182359
ISSUE DATE / TIME: 202308112051
Unit Type and Rh: 9500

## 2021-09-14 LAB — HEMOGLOBIN AND HEMATOCRIT, BLOOD
HCT: 24.9 % — ABNORMAL LOW (ref 36.0–46.0)
Hemoglobin: 8 g/dL — ABNORMAL LOW (ref 12.0–15.0)

## 2021-09-14 NOTE — Progress Notes (Signed)
Orthopedic Tech Progress Note Patient Details:  GELENE RECKTENWALD 1960/02/11 [id number] Rehab PRAFO has been ordered  from [facility]  Patient ID: DENNYS TRAUGHBER, female   DOB: 1960-09-27, 61 y.o.   MRN: [id number]  Jearld Lesch 09/14/2021, 8:39 AM

## 2021-09-14 NOTE — Progress Notes (Signed)
[facility] Progress Note   Subjective:   Ended HD early yesterday due to feeling cold and nauseous. Still nauseous today but better. Reports less SOB /cough since sitting up in chair. Denies CP, dizziness. Hb 8.0.   Objective Vitals:   09/13/21 2336 09/13/21 2340 09/14/21 0307 09/14/21 0500  BP: (!) 102/50 (!) 103/53 115/61   Pulse: (!) 114 (!) 114 (!) 104   Resp: 18 18 18    Temp: 98.7 F (37.1 C) 98.9 F (37.2 C) 98.9 F (37.2 C)   TempSrc: Oral     SpO2: 94% 91% 94%   Weight:    64.8 kg  Height:       Physical Exam General: Alert female in NAD Heart: RRR, no murmurs, rubs or gallops Lungs: CTA bilaterally without wheezing,rhonchi or rales Abdomen: Soft, non-distended, +BS Extremities: Trace edema b/l lower extremities Dialysis Access: RUE AVF + thrill  Additional Objective Labs: Basic Metabolic Panel: Recent Labs  Lab 09/11/21 1630 09/12/21 1627 09/13/21 1351  NA 122* 130* 127*  K 5.7* 4.0 4.1  CL 84* 91* 88*  CO2 24 27 27   GLUCOSE 90 103* 110*  BUN 88* 44* 50*  CREATININE 8.73* 5.50* 6.61*  CALCIUM 7.8* 8.3* 8.4*  PHOS 4.7* 3.6 3.6   Liver Function Tests: Recent Labs  Lab 09/11/21 1630 09/12/21 1627 09/13/21 1351  ALBUMIN 2.1* 2.1* 2.2*   No results for input(s): "LIPASE", "AMYLASE" in the last 168 hours. CBC: Recent Labs  Lab 09/09/21 0813 09/11/21 1630 09/13/21 1351 09/14/21 0617  WBC 5.2 7.1 6.8  --   HGB 7.7* 6.9* 7.2* 8.0*  HCT 24.6* 21.9* 22.3* 24.9*  MCV 87.9 86.9 87.5  --   PLT 111* 113* 140*  --    Blood Culture No results found for: "SDES", "SPECREQUEST", "CULT", "REPTSTATUS"  Cardiac Enzymes: No results for input(s): "CKTOTAL", "CKMB", "CKMBINDEX", "TROPONINI" in the last 168 hours. CBG: No results for input(s): "GLUCAP" in the last 168 hours. Iron Studies: No results for input(s): "IRON", "TIBC", "TRANSFERRIN", "FERRITIN" in the last 72 hours. @lablastinr3 @ Studies/Results: No results  found. Medications:   apixaban  2.5 mg Oral BID   atorvastatin  40 mg Oral QHS   Chlorhexidine Gluconate Cloth  6 each Topical Q0600   darbepoetin (ARANESP) injection - DIALYSIS  150 mcg Intravenous Q Wed-HD   docusate sodium  100 mg Oral BID   doxercalciferol  8 mcg Intravenous Q M,W,F-HD   feeding supplement  237 mL Oral BID BM   ferric citrate  210 mg Oral TID WC   guaiFENesin  200 mg Oral Q6H   multivitamin  1 tablet Oral QHS   pantoprazole  40 mg Oral BID   [START ON 09/15/2021] Vitamin D (Ergocalciferol)  50,000 Units Oral Q7 days    Dialysis Orders: [place] MWF  3h 73min  350/1.5   60.5kg  2/2 bath  P2  Hep 1200  RFA AVF - last Hb 9.5 on 7/26 - doxercalciferol 31mcg IV q HD - Mircera 82mcg IV q 4wks, last 7/24  Assessment/Plan: Fall w/ traumatic extremity Fx: S/p ORIF L distal femur Fx, ORIF L distal radius/ulnar Fx, IM nailing of R open tibial Fx on 08/29/21.  Per ortho. On Eliquis for DVT prophylaxis. Now in CIR 2. COVID: Hypoxic on admit. Finished course of molnupiravir + steroids. Off precautions. 3. ESRD: Continue HD on MWF schedule Hyponatremia: Chronic, but worsening lately. May be volume related, continue UF with HD as tolerated.  HTN/volume: BP stable - prior  OP meds on hold (losartan, isosorbide, coreg). Uses midodrine only prn with HD, orders adjusted. Having some orthostatic dizziness which correlates with onset of low hemoglobin. Does have some edema on exam, reestablishing EDW.  Anemia of ESRD: Hgb declining again this week. Received Aranesp 150 on 8/9. S/p 2U PRBCs on 7/27 and 1 unit on 09/13/21 Reports dark stool, will order FOBT 7. Secondary hyperparathyroidism: CorrCa/Phos controlled. Continue VDRA and binder.   8. Nutrition: Alb low, on supplements for wound healing. 9. Dispo: CIR.   Anice Paganini, PA-C 09/14/2021, 10:01 AM  [facility] Pager: [phone]

## 2021-09-14 NOTE — Plan of Care (Signed)
  Problem: Consults Goal: RH GENERAL PATIENT EDUCATION Description: See Patient Education module for education specifics. Outcome: Progressing Goal: Skin Care Protocol Initiated - if Braden Score 18 or less Description: If consults are not indicated, leave blank or document N/A Outcome: Progressing Goal: Nutrition Consult-if indicated Outcome: Progressing   Problem: RH BOWEL ELIMINATION Goal: RH STG MANAGE BOWEL WITH ASSISTANCE Description: STG Manage Bowel with mod Assistance. Outcome: Progressing Goal: RH STG MANAGE BOWEL W/MEDICATION W/ASSISTANCE Description: STG Manage Bowel with Medication with min Assistance. Outcome: Progressing   Problem: RH BLADDER ELIMINATION Goal: RH STG MANAGE BLADDER WITH ASSISTANCE Description: STG Manage Bladder With mod Assistance Outcome: Progressing   Problem: RH SKIN INTEGRITY Goal: RH STG SKIN FREE OF INFECTION/BREAKDOWN Description: Skin will be free of breakdown/infection with min assist Outcome: Progressing Goal: RH STG MAINTAIN SKIN INTEGRITY WITH ASSISTANCE Description: STG Maintain Skin Integrity With min Assistance. Outcome: Progressing Goal: RH STG ABLE TO PERFORM INCISION/WOUND CARE W/ASSISTANCE Description: STG Able To Perform Incision/Wound Care With mod Assistance. Outcome: Progressing   Problem: RH SAFETY Goal: RH STG ADHERE TO SAFETY PRECAUTIONS W/ASSISTANCE/DEVICE Description: STG Adhere to Safety Precautions With min Assistance/Device. Outcome: Progressing   Problem: RH PAIN MANAGEMENT Goal: RH STG PAIN MANAGED AT OR BELOW PT'S PAIN GOAL Description: Pain will be managed at 3 out of 10 on pain scale Outcome: Progressing   Problem: RH KNOWLEDGE DEFICIT GENERAL Goal: RH STG INCREASE KNOWLEDGE OF SELF CARE AFTER HOSPITALIZATION Description: Patient/ caregiver will be able to verbalize and perform self care from education provided by nursing and therapies, as well as handouts.  Outcome: Progressing   Problem:  Education: Goal: Knowledge of General Education information will improve Description: Including pain rating scale, medication(s)/side effects and non-pharmacologic comfort measures Outcome: Progressing   Problem: Health Behavior/Discharge Planning: Goal: Ability to manage health-related needs will improve Outcome: Progressing   Problem: Clinical Measurements: Goal: Ability to maintain clinical measurements within normal limits will improve Outcome: Progressing Goal: Will remain free from infection Outcome: Progressing Goal: Diagnostic test results will improve Outcome: Progressing Goal: Respiratory complications will improve Outcome: Progressing Goal: Cardiovascular complication will be avoided Outcome: Progressing   Problem: Activity: Goal: Risk for activity intolerance will decrease Outcome: Progressing   Problem: Nutrition: Goal: Adequate nutrition will be maintained Outcome: Progressing   Problem: Coping: Goal: Level of anxiety will decrease Outcome: Progressing   Problem: Elimination: Goal: Will not experience complications related to bowel motility Outcome: Progressing Goal: Will not experience complications related to urinary retention Outcome: Progressing

## 2021-09-14 NOTE — Progress Notes (Signed)
Occupational Therapy Session Note  Patient Details  Name: Kendra Clay MRN: [id number] Date of Birth: 08-Sep-1960  Today's Date: 09/14/2021 OT Individual Time: 1914-7829 OT Individual Time Calculation (min): 57 min   Session 2 Today's Date: 09/14/2021 OT Individual Time: 5621-3086 OT Individual Time Calculation (min): 45 min   Short Term Goals: Week 1:  OT Short Term Goal 1 (Week 1): Patient will complete toilet transfer with mod A of 1 OT Short Term Goal 2 (Week 1): Patient will complete 1 step of LB dressing task OT Short Term Goal 3 (Week 1): Pt will tolerate standing at the sink with min A in preparation for BADL task  Skilled Therapeutic Interventions/Progress Updates:    Pt greeted sitting up in recliner! Pt able to tolerate sitting up in between therapies today. Pt reported feeling much better after receiving blood yesterday. Pt agreeable to functional ambulation in room with encouragement.  2 trials for sit<>stand and min A. She then ambulated 8 feet in the room with CGA, PFRW, and increased time to step with R LE. PT needed encouragment to keep going to get to the wc set-up by the sink. Pt requested to wash her hair. OT educated on use of hair washing tray and how to purchase one. Pt able to maintain grasp on hair washing tray while OT assisted with washing. Pt then tolerated blow drying her hair using only R hand for 5 minutes without rest breaks! Pt returned to bed at end of session stand-pivot using bed rails and min A> Max A to lift LE's back in bed. Pt left semi-reclined in bed with bed alarm on, call bell in reach and needs met. _0  Session 2 Pt greeted semi-reclined in bed after finishing lunch. Pt wanted to change clothes. Bed level rolling with min A to pull down pants. Pt able to wash peri-area with set-up A. Worked on hip bridging in bed with OT assist to flex L LE into position, then pt able to lift hips slightly, but not enough to get bottom off of the bed. OT assist to  thread pant legs, then rolling to pull them up. Pt agreeable to sit EOB for UB dressing. Min A to get LE's off of the bed, then tolerated sitting for 5 minutes while putting on clean shirt> L hand there-ex with focus on finger flex.etx and edema management. Pt returned to bed with min A to lift lower legs. Pt left semi-reclined in bed with bed alarm on, call bell in reach, and neeeds met.   Therapy Documentation Precautions:  Precautions Precautions: Fall, Other (comment) Required Braces or Orthoses: Other Brace Other Brace: CAM boot on RLE with transfers/ mobility/ ambulation Restrictions Weight Bearing Restrictions: Yes LUE Weight Bearing: Non weight bearing RLE Weight Bearing: Weight bearing as tolerated LLE Weight Bearing: Weight bearing as tolerated Pain: Pain Assessment Pain Scale: 0-10 Pain Score: 3  Pain Type: Acute pain Pain Location: Leg Pain Orientation: Right Pain Radiating Towards: foot Pain Descriptors / Indicators: Aching Pain Frequency: Intermittent Pain Onset: On-going Patients Stated Pain Goal: 1 Pain Intervention(s): Pain med given for lower pain score than stated, per patient request Multiple Pain Sites: No    Therapy/Group: Individual Therapy  Valma Cava 09/14/2021, 11:10 AM

## 2021-09-14 NOTE — Progress Notes (Signed)
Physical Therapy Session Note  Patient Details  Name: Kendra Clay MRN: [id number] Date of Birth: December 05, 1960  Today's Date: 09/14/2021 PT Individual Time: 0810-0903 PT Individual Time Calculation (min): 53 min   Short Term Goals: Week 1:  PT Short Term Goal 1 (Week 1): Pt will perform bed mobility with consistent supervision. PT Short Term Goal 2 (Week 1): Pt will perform sit<>stand with Mod A +1 PT Short Term Goal 3 (Week 1): Pt will initiate gait training. PT Short Term Goal 4 (Week 1): Pt will perform seat to seat transfers with Mod A +1.  Skilled Therapeutic Interventions/Progress Updates:  Nurse cleared pt to participate in therapy - pt's Hgb 8.0 this AM.  Pt received supine in bed, reporting she feels "much better" today and is agreeable to therapy session. Donned R LE CAM boot total assist - educated pt that she needs her R ankle supported into DF due to decreased active DF movement - discussed PRAFO with MD. Supine>sitting R EOB, HOB partially elevated and using R UE support on bedrail, with intermittent light min assist to scoot L hip forward - requires increased time and verbal cuing for encouragement. Pt does well managing L UE elbow WBing only throughout session with pt self cuing.   Sit>stand from significantly elevated EOB>L PFRW (platform RW) with heavy mod assist for lifting to stand - pt places both hands up onto AD to get more leverage to lift into standing while therapist stabilized AD for safety. Noticed in standing that RW was too short.  Therapist retrieved taller RW and adjusted platform for improved fit to allow upright posture in standing.   Performed seated EOB L LE ankle PF x20 reps while therapist adjusted AD. Sit>supine with max assist for B LE management onto bed and trunk descent to rest due to onset of low back pain while sitting EOB. Pt reports pain relief with positional change.  Performed 3x additional sit>stands from significantly elevated EOB to L  PFRW with light mod assist to lift into standing from this higher surface. Pt only tolerates standing ~10seconds.  Short distance ~32ft ambulatory transfer towards recliner using L PFRW with min assist of 1 but pt becomes fatigued requiring +2 assist to bring recliner up behind pt to allow seated rest break due to significant fatigue.  Therapist placed wheelchair cushion and pillow in the recliner to create higher floor-to-seat height to increase independence with coming to stand from that seat.  Participated in ~16ft gait training using L PFRW with min assist of 1 and +2 providing recliner follow - pt with difficulty lifting R LE to advance during swing - does well tolerating WBing on R LE - no signs of knee buckling in either LE throughout.  At end of session, pt left sitting up in recliner with needs in reach, pillows supporting L UE, and seat belt alarm on.     Therapy Documentation Precautions:  Precautions Precautions: Fall, Other (comment) Required Braces or Orthoses: Other Brace Other Brace: CAM boot on RLE with transfers/ mobility/ ambulation Restrictions Weight Bearing Restrictions: Yes LUE Weight Bearing: Non weight bearing RLE Weight Bearing: Weight bearing as tolerated LLE Weight Bearing: Weight bearing as tolerated   Pain: Reports no pain at rest at end of session.    Therapy/Group: Individual Therapy  Kendra Clay , PT, DPT, NCS, CSRS 09/14/2021, 7:52 AM

## 2021-09-14 NOTE — Progress Notes (Signed)
PROGRESS NOTE   Subjective/Complaints:  Feels better after transfusion   ROS: Patient denies breathing issues, pain well controlled, bowels without constipation or diarrhea    Objective:   No results found. Recent Labs    09/11/21 1630 09/13/21 1351 09/14/21 0617  WBC 7.1 6.8  --   HGB 6.9* 7.2* 8.0*  HCT 21.9* 22.3* 24.9*  PLT 113* 140*  --     Recent Labs    09/12/21 1627 09/13/21 1351  NA 130* 127*  K 4.0 4.1  CL 91* 88*  CO2 27 27  GLUCOSE 103* 110*  BUN 44* 50*  CREATININE 5.50* 6.61*  CALCIUM 8.3* 8.4*     Intake/Output Summary (Last 24 hours) at 09/14/2021 0811 Last data filed at 09/14/2021 0552 Gross per 24 hour  Intake 664.33 ml  Output 1800 ml  Net -1135.67 ml         Physical Exam: Vital Signs Blood pressure 115/61, pulse (!) 104, temperature 98.9 F (37.2 C), resp. rate 18, height 5\' 3"  (1.6 m), weight 64.8 kg, SpO2 94 %.  General: No acute distress Mood and affect are appropriate Heart: Regular rate and rhythm no rubs murmurs or extra sounds Lungs: Clear to auscultation, breathing unlabored, no rales or wheezes Abdomen: Positive bowel sounds, soft nontender to palpation, nondistended Extremities: No clubbing, cyanosis, or edema Skin: No evidence of breakdown, no evidence of rash   Neuro:  Alert and oriented x 3. Normal insight and awareness. Intact Memory. Follows commands. Normal language and speech. Cranial nerve exam unremarkable. Motor limited by pain in LE and LUE. Musculoskeletal: multifocal swelling along bilateral LE and LUE.     Assessment/Plan: 1. Functional deficits which require 3+ hours per day of interdisciplinary therapy in a comprehensive inpatient rehab setting. Physiatrist is providing close team supervision and 24 hour management of active medical problems listed below. Physiatrist and rehab team continue to assess barriers to discharge/monitor patient progress  toward functional and medical goals  Care Tool:  Bathing    Body parts bathed by patient: Left arm, Chest, Abdomen, Face, Front perineal area   Body parts bathed by helper: Right arm, Buttocks, Right upper leg, Right lower leg, Left upper leg, Left lower leg     Bathing assist Assist Level: Maximal Assistance - Patient 24 - 49%     Upper Body Dressing/Undressing Upper body dressing   What is the patient wearing?: Pull over shirt    Upper body assist Assist Level: Moderate Assistance - Patient 50 - 74%    Lower Body Dressing/Undressing Lower body dressing      What is the patient wearing?: Pants, Incontinence brief     Lower body assist Assist for lower body dressing: Maximal Assistance - Patient 25 - 49%     Toileting Toileting Toileting Activity did not occur (Clothing management and hygiene only): N/A (no void or bm)  Toileting assist Assist for toileting: Dependent - Patient 0%     Transfers Chair/bed transfer  Transfers assist  Chair/bed transfer activity did not occur: Safety/medical concerns        Locomotion Ambulation   Ambulation assist   Ambulation activity did not occur: Safety/medical concerns  Walk 10 feet activity   Assist  Walk 10 feet activity did not occur: Safety/medical concerns        Walk 50 feet activity   Assist Walk 50 feet with 2 turns activity did not occur: Safety/medical concerns         Walk 150 feet activity   Assist Walk 150 feet activity did not occur: Safety/medical concerns         Walk 10 feet on uneven surface  activity   Assist Walk 10 feet on uneven surfaces activity did not occur: Safety/medical concerns         Wheelchair     Assist Is the patient using a wheelchair?: Yes (Per PT long term goals) Type of Wheelchair: Manual Wheelchair activity did not occur: Safety/medical concerns         Wheelchair 50 feet with 2 turns activity    Assist    Wheelchair 50  feet with 2 turns activity did not occur: Safety/medical concerns       Wheelchair 150 feet activity     Assist  Wheelchair 150 feet activity did not occur: Safety/medical concerns       Blood pressure 115/61, pulse (!) 104, temperature 98.9 F (37.2 C), resp. rate 18, height 5\' 3"  (1.6 m), weight 64.8 kg, SpO2 94 %.  Medical Problem List and Plan: 1. Functional deficits secondary to debility secondary to multiple fractures after fall underwent open reduction internal fixation of left distal femur fracture and ORIF of left distal radius fracture             -patient may not shower             -ELOS/Goals: 8/17, PT/OT mod I             -Continue CIR therapies including PT, OT  2.  Antithrombotics: -DVT/anticoagulation:  Pharmaceutical: Eliquis             -antiplatelet therapy: none 3. Pain Management: Tylenol, oxycodone as needed 4. Mood/Behavior/Sleep: LCSW to evaluate and provide emotional support             -antipsychotic agents: n/a 5. Neuropsych/cognition: This patient is capable of making decisions on her own behalf. 6. Skin/Wound Care: Routine skin care checks             -monitor surgical incisions..sutures out soon 7. Fluids/Electrolytes/Nutrition: Strict Is and Os; nephrology following             -daily weights             -renal diet/1200 cc fluid restriction--continues 8: Left femur fracture s/p ORIF: WBAT 9:Left distal radius and ulnar fracture s/p ORIF             -Nonweightbearing through the wrist, continue splint 10: Right open tibial fracture s/p IM nail:WBAT             -wear CAM boot with therapy/mobilization 11: ESRD on HD: M/W/F schedule per nephrology, she has a RUE AVF  -HD today 8/11 12: CAD s/p BMS to LAD in 2017: Stable, denies chest pain             -follows with Dr. Otho Perl (last seen 05/2021) 13: Covid pneumonia: treated and off precautions, denies SOB             -continue FV; Mucinex  -sx  improved  -8/10 Scheduled Mucinex 14: Vitamin D  deficiency: continue supplementation 15: Chronic heart failure (EF 40% in 2017): stable (normal left ventricular systolic  function)            Filed Weights   09/13/21 1450 09/13/21 1832 09/14/21 0500  Weight: 65.1 kg 64.3 kg 64.8 kg    16: Emphysema: FV/IS 17: Pancytopenia, improved  -WBC and PLT stable, HBG 6.9 8/9 18: Anemia: CKD/ABL s/p 2 units packed cells, Last HGB 7.7 on 8/7             -continue Aranesp, Auryxia per nephrology             -HGB 6.9 8/9, Discussed with nephrology yesterday, will transfuse today with HD, FOBT ordered by nephrology  Post transfusion Hgb 8.0 19: GERD: continue Protonix 20: Hyperlipidemia: continue Lipitor 21: Labile BP: hydralazine 10 mg q 6 hours prn sys BP > 170             -midodrine 5 mg as needed only at the start of dialysis   -observe for next session, bp elevated at baseline today  -BP a little soft but stable, HR a little improved, last recorded as 95  -Midodrine may be option if BP remains low and symptomatic 22. Constipation  -Resolved after multiple BM 8/7 through 8/10 23. Nausea  -Suspect related to dizziness related to anemia, transfusion planned for today 8/11. Hyponatremia could be contributing, although this is improved today and she has HD later.  Continue zofran 24. Decreased PO intake  -Dietician consult  -Continue prosource plus   LOS: 5 days A FACE TO FACE EVALUATION WAS PERFORMED  Charlett Blake 09/14/2021, 8:11 AM

## 2021-09-14 NOTE — Progress Notes (Signed)
Physical Therapy Session Note  Patient Details  Name: Kendra Clay MRN: [id number] Date of Birth: 1960/11/06  Today's Date: 09/14/2021 PT Individual Time: 1017-5102 PT Individual Time Calculation (min): 41 min   Short Term Goals: Week 1:  PT Short Term Goal 1 (Week 1): Pt will perform bed mobility with consistent supervision. PT Short Term Goal 2 (Week 1): Pt will perform sit<>stand with Mod A +1 PT Short Term Goal 3 (Week 1): Pt will initiate gait training. PT Short Term Goal 4 (Week 1): Pt will perform seat to seat transfers with Mod A +1.  Skilled Therapeutic Interventions/Progress Updates:    Pt received supine in bed and reports being tired from earlier therapy session, but is agreeable to do therapy exercises in bed. Pt reports a 2/10 pain in her left wrist and no pain currently in either LE.  Pt required rest breaks between all sets due to fatigue.  Pt performed the following therex for increased LE strength and ROM:  - Bilateral ankle pumps 2x20 - Bilateral ankle inversion and eversion x20 with cues to decrease LE motion and focus on ankle motion  - Bilateral heel slides: Right LE required min assisted x15, then CGA 2x20 with SPT support under knee and ankle, Left LE required mod assist 3x15 with decreased Left knee flexion compared to right side due to healing femur fx - bilateral SAQ right LE 2x20 and left LE 2x15  - bilateral glute sets 2x15 with 5 second isometric hold  - bilateral hip abd with SPT supporting under knee and heel 2x15   Pt left supine in bed, with call bed in reach, bed alarm on, ice packs on right shin and left knee, and all needs met.   Therapy Documentation Precautions:  Precautions Precautions: Fall, Other (comment) Required Braces or Orthoses: Other Brace Other Brace: CAM boot on RLE with transfers/ mobility/ ambulation Restrictions Weight Bearing Restrictions: Yes LUE Weight Bearing: Non weight bearing RLE Weight Bearing: Weight bearing as  tolerated LLE Weight Bearing: Weight bearing as tolerated   Pain: Pain Assessment Pain Scale: 0-10 Pain Score: 2  Pain Type: Acute pain Pain Location: Wrist Pain Orientation: Left Pain Descriptors / Indicators: Aching Pain Frequency: Occasional Pain Onset: With Activity Patients Stated Pain Goal: 1 Pain Intervention(s): Pain med given for lower pain score than stated, per patient request Multiple Pain Sites: No     Therapy/Group: Individual Therapy  Kayleen Memos, SPT 09/14/2021, 5:45 PM

## 2021-09-15 MED ORDER — CHLORHEXIDINE GLUCONATE CLOTH 2 % EX PADS
6.0000 | MEDICATED_PAD | Freq: Every day | CUTANEOUS | Status: DC
  Administered 2021-09-15 – 2021-09-21 (×7): 6 via TOPICAL

## 2021-09-15 NOTE — Progress Notes (Signed)
[facility] Progress Note   Subjective:   Seen in room, no new concerns today. Denies SOB, CP, dizziness, abdominal pain and nausea.   Objective Vitals:   09/14/21 0500 09/14/21 1324 09/14/21 1925 09/15/21 0347  BP:  133/62 125/65 120/65  Pulse:  (!) 106 100 (!) 101  Resp:  17 16 16   Temp:  98.5 F (36.9 C) 98.5 F (36.9 C) 98.3 F (36.8 C)  TempSrc:      SpO2:  97% 99% 93%  Weight: 64.8 kg   64.6 kg  Height:       Physical Exam General: Alert female in NAD Heart: RRR, no murmurs, rubs or gallops Lungs: CTA bilaterally without wheezing,rhonchi or rales Abdomen: Soft, non-distended, +BS Extremities: Trace edema LLE, 1+ nonpitting edema RLE Dialysis Access: RUE AVF + thrill  Additional Objective Labs: Basic Metabolic Panel: Recent Labs  Lab 09/11/21 1630 09/12/21 1627 09/13/21 1351  NA 122* 130* 127*  K 5.7* 4.0 4.1  CL 84* 91* 88*  CO2 24 27 27   GLUCOSE 90 103* 110*  BUN 88* 44* 50*  CREATININE 8.73* 5.50* 6.61*  CALCIUM 7.8* 8.3* 8.4*  PHOS 4.7* 3.6 3.6   Liver Function Tests: Recent Labs  Lab 09/11/21 1630 09/12/21 1627 09/13/21 1351  ALBUMIN 2.1* 2.1* 2.2*   No results for input(s): "LIPASE", "AMYLASE" in the last 168 hours. CBC: Recent Labs  Lab 09/09/21 0813 09/11/21 1630 09/13/21 1351 09/14/21 0617  WBC 5.2 7.1 6.8  --   HGB 7.7* 6.9* 7.2* 8.0*  HCT 24.6* 21.9* 22.3* 24.9*  MCV 87.9 86.9 87.5  --   PLT 111* 113* 140*  --    Blood Culture No results found for: "SDES", "SPECREQUEST", "CULT", "REPTSTATUS"  Cardiac Enzymes: No results for input(s): "CKTOTAL", "CKMB", "CKMBINDEX", "TROPONINI" in the last 168 hours. CBG: No results for input(s): "GLUCAP" in the last 168 hours. Iron Studies: No results for input(s): "IRON", "TIBC", "TRANSFERRIN", "FERRITIN" in the last 72 hours. @lablastinr3 @ Studies/Results: No results found. Medications:   apixaban  2.5 mg Oral BID   atorvastatin  40 mg Oral QHS   Chlorhexidine  Gluconate Cloth  6 each Topical Q0600   darbepoetin (ARANESP) injection - DIALYSIS  150 mcg Intravenous Q Wed-HD   docusate sodium  100 mg Oral BID   doxercalciferol  8 mcg Intravenous Q M,W,F-HD   feeding supplement  237 mL Oral BID BM   ferric citrate  210 mg Oral TID WC   guaiFENesin  200 mg Oral Q6H   multivitamin  1 tablet Oral QHS   pantoprazole  40 mg Oral BID   Vitamin D (Ergocalciferol)  50,000 Units Oral Q7 days    Dialysis Orders: [facility] MWF  3h 5min  350/1.5   60.5kg  2/2 bath  P2  Hep 1200  RFA AVF - last Hb 9.5 on 7/26 - doxercalciferol 69mcg IV q HD - Mircera 48mcg IV q 4wks, last 7/24  Assessment/Plan: Fall w/ traumatic extremity Fx: S/p ORIF L distal femur Fx, ORIF L distal radius/ulnar Fx, IM nailing of R open tibial Fx on 08/29/21.  Per ortho. On Eliquis for DVT prophylaxis. Now in CIR 2. COVID: Hypoxic on admit. Finished course of molnupiravir + steroids. Off precautions. 3. ESRD: Continue HD on MWF schedule 4. Hyponatremia: Chronic, but worsening lately. May be volume related, continue UF with HD as tolerated.  5. HTN/volume: BP stable - prior OP meds on hold (losartan, isosorbide, coreg). Uses midodrine only prn with HD, orders adjusted.  Having some orthostatic dizziness which correlates with onset of low hemoglobin. Does have some edema on exam, reestablishing EDW.  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 Received Aranesp 150 on 8/9. S/p 2U PRBCs on 7/27 and 1 unit on 09/13/21 Reports dark stool, will order FOBT 7. Secondary hyperparathyroidism: CorrCa/Phos controlled. Continue VDRA and binder.   8. Nutrition: Alb low, on supplements for wound healing. 9. Dispo: CIR.   Anice Paganini, PA-C 09/15/2021, 10:44 AM  [facility] Pager: [phone]

## 2021-09-15 NOTE — Progress Notes (Signed)
Physical Therapy Session Note  Patient Details  Name: Kendra Clay MRN: [id number] Date of Birth: 05/22/1960  Today's Date: 09/15/2021 PT Individual Time: 1120-1205 PT Individual Time Calculation (min): 45 min   Short Term Goals: Week 1:  PT Short Term Goal 1 (Week 1): Pt will perform bed mobility with consistent supervision. PT Short Term Goal 2 (Week 1): Pt will perform sit<>stand with Mod A +1 PT Short Term Goal 3 (Week 1): Pt will initiate gait training. PT Short Term Goal 4 (Week 1): Pt will perform seat to seat transfers with Mod A +1.  Skilled Therapeutic Interventions/Progress Updates:    Pt presents in bed and agreeable to session. Focused  on dressing of lowerbody with assistance to don pants per pt request in bed initially. Pt performed modified bridging technique to pull up pants herself as much as possible.  With supervision comes to EOB using bed rail for support. Total assist to don R CAM boot. Sit > stand from slightly elevated surface with initial mod assist with facilitation for anterior weightshit and to stabilize walker to increase confidence with assist to finish pulling up pants completely in standing. CGA for balance. Performed min assist transfer with PFRW to the w/c and and cues for technique and hand placement. Total assist for w/c parts management. Transported to gym to get out of room for session. Focused on continuing  sit > stand retraining and gait to tolerance. Pt limited by fatigue with all mobility and some pain in LUE by end of session. Sit > stands progressed to overall min/CGA including transition of RUE onto the [place] and cues for anterior weightshift from tactile to verbal with pt able to gait  about 3' and then 4' at a time with overall CGA. Encouraged one more repetition but pt states she is unable at this time due to pain and fatigue. Returned to room via w/c total assist  and performed CGA transfer with PFRW from w/c to recliner and positioned with pillow  for LUE support. Education provided  on encouragement of all mobility and pt able to figure out technique to scoot back in w/c and recliner this session without physical intervention.   Therapy Documentation Precautions:  Precautions Precautions: Fall, Other (comment) Required Braces or Orthoses: Other Brace Other Brace: CAM boot on RLE with transfers/ mobility/ ambulation Restrictions Weight Bearing Restrictions: Yes LUE Weight Bearing: Non weight bearing RLE Weight Bearing: Weight bearing as tolerated LLE Weight Bearing: Weight bearing as tolerated   Pain: Initially states pain is managed but LUE pain increasing with use during mobility and pt reports being premedicated.    Therapy/Group: Individual Therapy  Canary Brim Ivory Broad, PT, DPT, CBIS  09/15/2021, 12:27 PM

## 2021-09-16 LAB — CBC
HCT: 22.1 % — ABNORMAL LOW (ref 36.0–46.0)
Hemoglobin: 7.2 g/dL — ABNORMAL LOW (ref 12.0–15.0)
MCH: 28.9 pg (ref 26.0–34.0)
MCHC: 32.6 g/dL (ref 30.0–36.0)
MCV: 88.8 fL (ref 80.0–100.0)
Platelets: 113 10*3/uL — ABNORMAL LOW (ref 150–400)
RBC: 2.49 MIL/uL — ABNORMAL LOW (ref 3.87–5.11)
RDW: 17.8 % — ABNORMAL HIGH (ref 11.5–15.5)
WBC: 5.8 10*3/uL (ref 4.0–10.5)
nRBC: 0 % (ref 0.0–0.2)

## 2021-09-16 LAB — RENAL FUNCTION PANEL
Albumin: 2.1 g/dL — ABNORMAL LOW (ref 3.5–5.0)
Anion gap: 9 (ref 5–15)
BUN: 48 mg/dL — ABNORMAL HIGH (ref 6–20)
CO2: 26 mmol/L (ref 22–32)
Calcium: 7.9 mg/dL — ABNORMAL LOW (ref 8.9–10.3)
Chloride: 87 mmol/L — ABNORMAL LOW (ref 98–111)
Creatinine, Ser: 8.37 mg/dL — ABNORMAL HIGH (ref 0.44–1.00)
GFR, Estimated: 5 mL/min — ABNORMAL LOW (ref >60)
Glucose, Bld: 109 mg/dL — ABNORMAL HIGH (ref 70–99)
Phosphorus: 2.9 mg/dL (ref 2.5–4.6)
Potassium: 4 mmol/L (ref 3.5–5.1)
Sodium: 122 mmol/L — ABNORMAL LOW (ref 135–145)

## 2021-09-16 MED ORDER — LIDOCAINE HCL (PF) 1 % IJ SOLN: 5.0000 mL | INTRAMUSCULAR | Status: DC | PRN

## 2021-09-16 MED ORDER — LIDOCAINE-PRILOCAINE 2.5-2.5 % EX CREA: 1.0000 | TOPICAL_CREAM | CUTANEOUS | Status: DC | PRN

## 2021-09-16 MED ORDER — SENNOSIDES-DOCUSATE SODIUM 8.6-50 MG PO TABS
2.0000 | ORAL_TABLET | Freq: Every day | ORAL | Status: DC
  Administered 2021-09-20 – 2021-09-23 (×4): 2 via ORAL
  Filled 2021-09-16 (×7): qty 2

## 2021-09-16 MED ORDER — ALTEPLASE 2 MG IJ SOLR: 2.0000 mg | Freq: Once | INTRAMUSCULAR | Status: DC | PRN

## 2021-09-16 MED ORDER — ANTICOAGULANT SODIUM CITRATE 4% (200MG/5ML) IV SOLN
5.0000 mL | Status: DC | PRN
  Filled 2021-09-16: qty 5

## 2021-09-16 MED ORDER — SORBITOL 70 % SOLN
60.0000 mL | Status: AC
  Administered 2021-09-16: 60 mL via ORAL
  Filled 2021-09-16: qty 60

## 2021-09-16 MED ORDER — PENTAFLUOROPROP-TETRAFLUOROETH EX AERO: 1.0000 | INHALATION_SPRAY | CUTANEOUS | Status: DC | PRN

## 2021-09-16 NOTE — Progress Notes (Signed)
PROGRESS NOTE   Subjective/Complaints:  No new issues. Left wrist is tender on and off. Appetite is fair  ROS: Patient denies fever, rash, sore throat, blurred vision, dizziness, nausea, vomiting, diarrhea, cough, shortness of breath or chest pain,  headache, or mood change.    Objective:   No results found. Recent Labs    09/13/21 1351 09/14/21 0617  WBC 6.8  --   HGB 7.2* 8.0*  HCT 22.3* 24.9*  PLT 140*  --    Recent Labs    09/13/21 1351  NA 127*  K 4.1  CL 88*  CO2 27  GLUCOSE 110*  BUN 50*  CREATININE 6.61*  CALCIUM 8.4*    Intake/Output Summary (Last 24 hours) at 09/16/2021 0906 Last data filed at 09/16/2021 0545 Gross per 24 hour  Intake 594 ml  Output --  Net 594 ml        Physical Exam: Vital Signs Blood pressure 132/70, pulse 91, temperature 97.9 F (36.6 C), resp. rate 16, height 5\' 3"  (1.6 m), weight 72.4 kg, SpO2 95 %.  Constitutional: No distress . Vital signs reviewed. HEENT: NCAT, EOMI, oral membranes moist Neck: supple Cardiovascular: RRR without murmur. No JVD    Respiratory/Chest: CTA Bilaterally without wheezes or rales. Normal effort    GI/Abdomen: BS +, non-tender, non-distended Ext: no clubbing, cyanosis, or edema Psych: pleasant and cooperative  Skin: No evidence of breakdown, no evidence of rash Bruising bilateral legs.  Neuro:  Alert and oriented x 3. Normal insight and awareness. Intact Memory. Follows commands. Normal language and speech. Cranial nerve exam unremarkable. Motor limited by pain in LE and LUE. Musculoskeletal: multifocal swelling along bilateral LE and LUE. Left wrist swelling has improved. Splint is snug and in appropriate alignment. Left wrist sl tender with palpation.    Assessment/Plan: 1. Functional deficits which require 3+ hours per day of interdisciplinary therapy in a comprehensive inpatient rehab setting. Physiatrist is providing close team  supervision and 24 hour management of active medical problems listed below. Physiatrist and rehab team continue to assess barriers to discharge/monitor patient progress toward functional and medical goals  Care Tool:  Bathing    Body parts bathed by patient: Face   Body parts bathed by helper: Right arm, Buttocks, Right upper leg, Right lower leg, Left upper leg, Left lower leg     Bathing assist Assist Level: Supervision/Verbal cueing     Upper Body Dressing/Undressing Upper body dressing   What is the patient wearing?: Pull over shirt    Upper body assist Assist Level: Moderate Assistance - Patient 50 - 74%    Lower Body Dressing/Undressing Lower body dressing      What is the patient wearing?: Pants, Incontinence brief     Lower body assist Assist for lower body dressing: Maximal Assistance - Patient 25 - 49%     Toileting Toileting Toileting Activity did not occur (Clothing management and hygiene only): N/A (no void or bm)  Toileting assist Assist for toileting: Dependent - Patient 0%     Transfers Chair/bed transfer  Transfers assist  Chair/bed transfer activity did not occur: Safety/medical concerns  Chair/bed transfer assist level: Minimal Assistance - Patient > 75%  Chair/bed transfer assistive device: Armrests, Environmental consultant, Orthosis   Locomotion Ambulation   Ambulation assist   Ambulation activity did not occur: Safety/medical concerns  Assist level: Contact Guard/Touching assist Assistive device: Walker-platform (CAM boot) Max distance: 4'   Walk 10 feet activity   Assist  Walk 10 feet activity did not occur: Safety/medical concerns        Walk 50 feet activity   Assist Walk 50 feet with 2 turns activity did not occur: Safety/medical concerns         Walk 150 feet activity   Assist Walk 150 feet activity did not occur: Safety/medical concerns         Walk 10 feet on uneven surface  activity   Assist Walk 10 feet on uneven  surfaces activity did not occur: Safety/medical concerns         Wheelchair     Assist Is the patient using a wheelchair?: Yes (Per PT long term goals) Type of Wheelchair: Manual Wheelchair activity did not occur: Safety/medical concerns         Wheelchair 50 feet with 2 turns activity    Assist    Wheelchair 50 feet with 2 turns activity did not occur: Safety/medical concerns       Wheelchair 150 feet activity     Assist  Wheelchair 150 feet activity did not occur: Safety/medical concerns       Blood pressure 132/70, pulse 91, temperature 97.9 F (36.6 C), resp. rate 16, height 5\' 3"  (1.6 m), weight 72.4 kg, SpO2 95 %.  Medical Problem List and Plan: 1. Functional deficits secondary to debility secondary to multiple fractures after fall underwent open reduction internal fixation of left distal femur fracture and ORIF of left distal radius fracture             -patient may not shower             -ELOS/Goals: 8/17, PT/OT mod I            -Continue CIR therapies including PT, OT  2.  Antithrombotics: -DVT/anticoagulation:  Pharmaceutical: Eliquis             -antiplatelet therapy: none 3. Pain Management: Tylenol, oxycodone as needed 4. Mood/Behavior/Sleep: LCSW to evaluate and provide emotional support             -antipsychotic agents: n/a 5. Neuropsych/cognition: This patient is capable of making decisions on her own behalf. 6. Skin/Wound Care: Routine skin care checks             -monitor surgical incisions..sutures out soon 7. Fluids/Electrolytes/Nutrition: Strict Is and Os; nephrology following             -daily weights             -renal diet/1200 cc fluid restriction ongoing 8: Left femur fracture s/p ORIF: WBAT 9:Left distal radius and ulnar fracture s/p ORIF             -Nonweightbearing through the wrist, continue splint  -there is mild tenderness but splint is appropriately fitting.   -encouraged her to avoid using wrist.  10: Right open  tibial fracture s/p IM nail:WBAT             -wear CAM boot with therapy/mobilization 11: ESRD on HD: M/W/F schedule per nephrology, she has a RUE AVF  -HD after therapies to allow for therapy participation 12: CAD s/p BMS to LAD in 2017: Stable, denies chest pain             -  follows with Dr. Otho Perl (last seen 05/2021) 13: Covid pneumonia: treated and off precautions, denies SOB             -continue FV; Mucinex  -sx  improved  -8/10 Scheduled Mucinex 14: Vitamin D deficiency: continue supplementation 15: Chronic heart failure (EF 40% in 2017): stable (normal left ventricular systolic function)            Filed Weights   09/14/21 0500 09/15/21 0347 09/16/21 0520  Weight: 64.8 kg 64.6 kg 72.4 kg    Weights up an down with HD 16: Emphysema: FV/IS 17: Pancytopenia, improved  -WBC and PLT stable, HBG 6.9 8/9 18: Anemia: CKD/ABL s/p 2 units packed cells, Last HGB 7.7 on 8/7             -continue Aranesp, Auryxia per nephrology             -HGB up to 8, received transfusion in HD. Labs today Post transfusion Hgb 8.0 19: GERD: continue Protonix 20: Hyperlipidemia: continue Lipitor 21: Labile BP: hydralazine 10 mg q 6 hours prn sys BP > 170             -midodrine 5 mg as needed only at the start of dialysis   -observe for next session, bp elevated at baseline today  -bp and HR controlled 22. Constipation  -Resolved after multiple BM 8/7 through 8/10---needs another as it's 8/14   -give sorbitol today. Add scheduled senna-s 23. Nausea  -prn zofran, reglan 24. Decreased PO intake  -Dietician consult  -Continue prosource plus   LOS: 7 days A FACE TO FACE EVALUATION WAS PERFORMED  Meredith Staggers 09/16/2021, 9:06 AM

## 2021-09-16 NOTE — Progress Notes (Signed)
Physical Therapy Session Note  Patient Details  Name: Kendra Clay MRN: [id number] Date of Birth: April 01, 1960  Today's Date: 09/16/2021 PT Individual Time: 0902-1014 PT Individual Time Calculation (min): 72 min   Short Term Goals: Week 1:  PT Short Term Goal 1 (Week 1): Pt will perform bed mobility with consistent supervision. PT Short Term Goal 2 (Week 1): Pt will perform sit<>stand with Mod A +1 PT Short Term Goal 3 (Week 1): Pt will initiate gait training. PT Short Term Goal 4 (Week 1): Pt will perform seat to seat transfers with Mod A +1.  Skilled Therapeutic Interventions/Progress Updates:     Pt received seated in [facility] and agrees to therapy, though reports that she is very uncomfortable sitting in chair. Reports 3/10 pain in bilateral lower extremities and L arm. PT provides frequent rest breaks to manage pain. WC transport to gym for time management. Pt mentions that she may not be able to do much today due to fatigue. PT educates pt on need to tolerate discomfort and/or nausea and persevere through therapy due to frequent missed time and need to progress to safe level of functional mobility for discharge. Pt verbalizes understanding. Sit to stand with modA and cues for hand placement and body mechanics. Pt says she needs to sit back down immediately but is able to stand up to ~1 minutes with consistent verbal encouragement. Following extended seated rest break, pt stands with minA. PT cues for posture and body mechanics. Pt ambulates x7' with minA and encouragement. Following extended seated rest break, pt ambulates x9' with same assist and increased encouragement. Extended seated rest break. PT provides visual goal for pt for ambulation distance and pt responds well, ambulating 14' on final attempt. Again, pt attempts to limit self but is able to ambulate multiple feet further when PT provides firm encouragement. Following, pt states she does not feel like she can ambulate anymore today. Pt  is agreeable to performing Nustep, however, and transfers with modA and stand step technique. Pt completes for strength and endurance training, as well as regaining ROM in bilateral lower extremities. Pt completes at workload of 4 decreasing to 3, with average steps per minute 10-15. PT encourages pt to attempt faster rate but pt states she cannot because it will likely hurt. Completes for 8:30 seconds of active time with frequent rest breaks. Stand pivot to WC with modA. Left seated with alarm intact and all needs within reach.  Therapy Documentation Precautions:  Precautions Precautions: Fall, Other (comment) Required Braces or Orthoses: Other Brace Other Brace: CAM boot on RLE with transfers/ mobility/ ambulation Restrictions Weight Bearing Restrictions: Yes LUE Weight Bearing: Non weight bearing RLE Weight Bearing: Weight bearing as tolerated LLE Weight Bearing: Weight bearing as tolerated  Therapy/Group: Individual Therapy  Breck Coons, PT, DPT 09/16/2021, 5:52 PM

## 2021-09-16 NOTE — Progress Notes (Signed)
Occupational Therapy Session Note  Patient Details  Name: Kendra Clay MRN: [id number] Date of Birth: 12/20/1960  Today's Date: 09/16/2021 OT Individual Time: 0730-0830 OT Individual Time Calculation (min): 60 min    Short Term Goals: Week 1:  OT Short Term Goal 1 (Week 1): Patient will complete toilet transfer with mod A of 1 OT Short Term Goal 2 (Week 1): Patient will complete 1 step of LB dressing task OT Short Term Goal 3 (Week 1): Pt will tolerate standing at the sink with min A in preparation for BADL task  Skilled Therapeutic Interventions/Progress Updates:    Upon OT arrival, pt semi recumbent in bed and reports no pain. Pt agreeable to OT treatment session. Pt engages in self care retraining and transfer training. Pt completes supine to sit transfer with SBA and requires increased time and verbal cues to complete with use of bed rail. CAM boot donned dependently. Pt completes sit to stand transfer with 3 attempts using PFRW and Mod A. Pt able to ambulate towards sink ~10' before requiring seated rest break. Encouragement required to continue. Pt attempts sit pivot transfer into w/c from BSC and was unsuccessful. Pt completes sit to stand transfer with Mod A to PFRW and completes stand step transfer into w/c with Mod A. Pt was positioned infront of sink and completes grooming and UB dressing at the levels below. Lotion donned with Mod A. Hair was put up in ponytail with total A.  Pt requesting pain medication at end of session reporting 5/10 pain. RN notified. Pt left in w/c at end of session with all needs met.   Therapy Documentation Precautions:  Precautions Precautions: Fall, Other (comment) Required Braces or Orthoses: Other Brace Other Brace: CAM boot on RLE with transfers/ mobility/ ambulation Restrictions Weight Bearing Restrictions: Yes LUE Weight Bearing: Non weight bearing RLE Weight Bearing: Weight bearing as tolerated LLE Weight Bearing: Weight bearing as  tolerated   ADL: ADL Eating: Set up Grooming: Minimal assistance Where Assessed-Grooming: Sitting at sink Upper Body Bathing: Moderate assistance Lower Body Bathing: Maximal assistance Upper Body Dressing: Minimal assistance Lower Body Dressing: Dependent (CAM) Toileting: Unable to assess Toilet Transfer: Unable to assess Toilet Transfer Equipment: Bedside commode Tub/Shower Transfer: Moderate assistance   Therapy/Group: Individual Therapy  Kayla  Hirschfelder 09/16/2021, 8:19 AM 

## 2021-09-16 NOTE — Progress Notes (Signed)
Patient ID: Kendra Clay, female   DOB: November 13, 1960, 62 y.o.   MRN: [id number]  SW spoke with Kendra Clay at [place] [phone]) to follow-up about referral. Reports will still need patient to turn in form with signature and brought back to the office. They are also still waiting to see if there can be approval given for her dialysis seat time.   SW returned phone call to pt son Kendra Clay to discuss family edu. SW informed on hours family edu is scheduled. Reports that his transportation fell through for Tuesday. Reports his biggest challenge is getting someone to drop  him off and pick him up. SW shared about above dialysis transportation concerns. He is going to work on someone brining in the forms for pt to sign and will try and get someone to drop off at the office as well.   SW met with pt in room to discuss above.   SW left message for dialysis SW- Kendra Clay at [facility] location [phone] ext. [phone]) on above issues.    Loralee Pacas, MSW, [place] Office: [phone] Cell: [phone] Fax: [phone]

## 2021-09-16 NOTE — Progress Notes (Signed)
Occupational Therapy Session Note  Patient Details  Name: Kendra Clay MRN: [id number] Date of Birth: 1960-04-13  Today's Date: 09/16/2021 OT Individual Time: 1045-1130 OT Individual Time Calculation (min): 45 min  and Today's Date: 09/16/2021 OT Missed Time: 30 Minutes Missed Time Reason: Patient fatigue   Short Term Goals: Week 1:  OT Short Term Goal 1 (Week 1): Patient will complete toilet transfer with mod A of 1 OT Short Term Goal 2 (Week 1): Patient will complete 1 step of LB dressing task OT Short Term Goal 3 (Week 1): Pt will tolerate standing at the sink with min A in preparation for BADL task  Skilled Therapeutic Interventions/Progress Updates:    Pt resting in w/c upon arrival. Pt reports she is extremely tired this morning but willing "to try." Kinesio tape reapplied to Lt fingers for edema mgmt. Sit>stand from w/c with min A. Pt took 5 steps and c/o increased fatigue when asked to walk to other side of bed. Requested she turn around and walk to [facility]. Pt unable to and sat at foot of bed. Attempted sit>stand from EOB were unsuccessful. Squat and lateral scoot with max A x 5 and pt states she is unable to continue. MAx A for sit>supine. +2 for repositioning. Pt missed 30 mins skilled OT services 2/2 fatigue. Pt remained in bed with all needs within reach and bed alarm activated.   Therapy Documentation Precautions:  Precautions Precautions: Fall, Other (comment) Required Braces or Orthoses: Other Brace Other Brace: CAM boot on RLE with transfers/ mobility/ ambulation Restrictions Weight Bearing Restrictions: Yes LUE Weight Bearing: Non weight bearing RLE Weight Bearing: Weight bearing as tolerated LLE Weight Bearing: Weight bearing as tolerated General: General OT Amount of Missed Time: 30 Minutes Pain: Pt reports Lt wrist pain this morning, 7/10; repositioned  Therapy/Group: Individual Therapy  Leroy Libman 09/16/2021, 11:44 AM

## 2021-09-16 NOTE — Progress Notes (Signed)
Received patient in bed to unit.  Alert and oriented.  Informed consent signed and in  chart.   Treatment initiated: 1415 Treatment completed: 1740  Patient tolerated well.  Transported back to the room  alert, without acute distress.  Hand-off given to patient's nurse.   Access used: Avfistula Access issues: none  Total UF removed: 1.2L Medication(s) given: n/a Post HD VS: 103/59,91,14,96%,97.9 Post HD weight: 67.6kg   [facility]

## 2021-09-16 NOTE — Progress Notes (Signed)
[facility] Progress Note   Subjective:   Seen in room, no new concerns today. Denies SOB, CP, dizziness, abdominal pain and nausea. Felt better after transfusion. Dialysis today.   Objective Vitals:   09/15/21 0347 09/15/21 1308 09/15/21 1951 09/16/21 0520  BP: 120/65 116/60 123/68 132/70  Pulse: (!) 101 95 96 91  Resp: 16 16 17 16   Temp: 98.3 F (36.8 C) 98 F (36.7 C) 98.4 F (36.9 C) 97.9 F (36.6 C)  TempSrc:  Oral    SpO2: 93% 96% 95% 95%  Weight: 64.6 kg   72.4 kg  Height:       Physical Exam General: Alert female in NAD Heart: RRR, no murmurs, rubs or gallops Lungs: CTA bilaterally without wheezing,rhonchi or rales Abdomen: Soft, non-distended, +BS Extremities: Trace edema LLE, 1+ nonpitting edema RLE Dialysis Access: RUE AVF + thrill  Additional Objective Labs: Basic Metabolic Panel: Recent Labs  Lab 09/11/21 1630 09/12/21 1627 09/13/21 1351  NA 122* 130* 127*  K 5.7* 4.0 4.1  CL 84* 91* 88*  CO2 24 27 27   GLUCOSE 90 103* 110*  BUN 88* 44* 50*  CREATININE 8.73* 5.50* 6.61*  CALCIUM 7.8* 8.3* 8.4*  PHOS 4.7* 3.6 3.6    Liver Function Tests: Recent Labs  Lab 09/11/21 1630 09/12/21 1627 09/13/21 1351  ALBUMIN 2.1* 2.1* 2.2*    No results for input(s): "LIPASE", "AMYLASE" in the last 168 hours. CBC: Recent Labs  Lab 09/11/21 1630 09/13/21 1351 09/14/21 0617  WBC 7.1 6.8  --   HGB 6.9* 7.2* 8.0*  HCT 21.9* 22.3* 24.9*  MCV 86.9 87.5  --   PLT 113* 140*  --     Blood Culture No results found for: "SDES", "SPECREQUEST", "CULT", "REPTSTATUS"  Cardiac Enzymes: No results for input(s): "CKTOTAL", "CKMB", "CKMBINDEX", "TROPONINI" in the last 168 hours. CBG: No results for input(s): "GLUCAP" in the last 168 hours. Iron Studies: No results for input(s): "IRON", "TIBC", "TRANSFERRIN", "FERRITIN" in the last 72 hours. @lablastinr3 @ Studies/Results: No results found. Medications:   apixaban  2.5 mg Oral BID   atorvastatin   40 mg Oral QHS   Chlorhexidine Gluconate Cloth  6 each Topical Q0600   Chlorhexidine Gluconate Cloth  6 each Topical Q0600   darbepoetin (ARANESP) injection - DIALYSIS  150 mcg Intravenous Q Wed-HD   docusate sodium  100 mg Oral BID   doxercalciferol  8 mcg Intravenous Q M,W,F-HD   feeding supplement  237 mL Oral BID BM   ferric citrate  210 mg Oral TID WC   guaiFENesin  200 mg Oral Q6H   multivitamin  1 tablet Oral QHS   pantoprazole  40 mg Oral BID   Vitamin D (Ergocalciferol)  50,000 Units Oral Q7 days    Dialysis Orders: [facility] MWF  3h 77min  350/1.5   60.5kg  2/2 bath  P2  Hep 1200  RFA AVF - last Hb 9.5 on 7/26 - doxercalciferol 87mcg IV q HD - Mircera 7mcg IV q 4wks, last 7/24  Assessment/Plan: Fall w/ traumatic extremity Fx: S/p ORIF L distal femur Fx, ORIF L distal radius/ulnar Fx, IM nailing of R open tibial Fx on 08/29/21.  Per ortho. On Eliquis for DVT prophylaxis. Now in CIR 2. COVID: Hypoxic on admit. Finished course of molnupiravir + steroids. Off precautions. 3. ESRD: Continue HD on MWF schedule 4. Hyponatremia: Chronic, but worsening lately. May be volume related, continue UF with HD as tolerated.  5. HTN/volume: BP stable - prior OP  meds on hold (losartan, isosorbide, coreg). Uses midodrine only prn with HD, orders adjusted. Having some orthostatic dizziness which correlates with onset of low hemoglobin. Does have some edema on exam, reestablishing EDW.  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 Received Aranesp 150 on 8/9. S/p 2U PRBCs on 7/27 and 1 unit on 09/13/21 Reports dark stool, will order FOBT 7. Secondary hyperparathyroidism: CorrCa/Phos controlled. Continue VDRA and binder.   8. Nutrition: Alb low, on supplements for wound healing. 9. Dispo: CIR.   Lynnda Child PA-C [facility] 09/16/2021,8:47 AM

## 2021-09-17 NOTE — Patient Care Conference (Signed)
Inpatient RehabilitationTeam Conference and Plan of Care Update Date: 09/19/2021   Time: 10:30 AM    Patient Name: Kendra Clay      Medical Record Number: [id number]  Date of Birth: August 31, 1960 Sex: Female         Room/Bed: 4M07C/4M07C-01 Payor Info: Payor: MEDICARE / Plan: MEDICARE PART A AND B / Product Type: *No Product type* /    Admit Date/Time:  09/09/2021  4:26 PM  Primary Diagnosis:  Multiple fractures of both lower extremities and ribs  Hospital Problems: Principal Problem:   Multiple fractures of both lower extremities and ribs Active Problems:   COVID-19    Expected Discharge Date: Expected Discharge Date: 09/24/21  Team Members Present: Physician leading conference: Dr. Alger Simons Social Worker Present: Loralee Pacas, [place] Nurse Present: Other (comment) Tacy Learn, RN) PT Present: Tereasa Coop, PT OT Present: Cherylynn Ridges, OT PPS Coordinator present : Gunnar Fusi, SLP     Current Status/Progress Goal Weekly Team Focus  Bowel/Bladder   intermittent continence of bowel and bladder. LBP 8/15.    To be continent of B/B   Offer toilet every 2/3 hours and prn   Swallow/Nutrition/ Hydration             ADL's   Mod A LB ADLs, Min A UB ADLs  Supervision goals may need to downgrade  activity tolerance, dc planning, transfers, sit<>stands, participation   Mobility   minA/modA bed mobility, minA sit to stand transfers, gait up to 14' with L platform RW  CGA  increased participation, sit to stand transfers and bed to chair transfer, ambulation, family ed   Communication             Safety/Cognition/ Behavioral Observations            Pain   PRN Oxy    Pain less than 3 with prns Assess PRN for pain and prior to therapies.  Skin   Incisions on BLE, left knee, left thigh, right knee. Bruising RLE. Splint on LUE.    Incisions remain free from infection/breakdown   Assess incisions and skin every shift and prn for changes    Discharge Planning:   D/c to home with her son. MWF 5:30am [facility]. Biggest barrier is transportation to dialysis since she is unable to drive herself, and her son does not have a license. Working on getting pt registered for [place], services are unsure if they will be put in place until paperwork has been returned, and if the supervisor is able to approve her dialysis time.   Team Discussion: Multiple fractures of BLE and ribs. Incontinent/continent LBM 08/15. Pain improving. B/P improving. Patient self limiting and may downgrade goals. HD MWF.  Patient on target to meet rehab goals: yes, goals modified. Ambulated 90ft yesterday  *See Care Plan and progress notes for long and short-term goals.   Revisions to Treatment Plan:  Medication adjustments, labs  Teaching Needs: Medications, safety, skin/wound care, gait/transfer training, etc.  Current Barriers to Discharge: Decreased caregiver support, Medical stability, Home enviroment access/layout, Incontinence, Wound care, Hemodialysis, and Weight bearing restrictions  Possible Resolutions to Barriers: Family education, medication education, skin/wound care, restriction education, transportation to appointments, order recommended DME     Medical Summary Current Status: bilateral fx's after fall. NWB left wrist. WBAT BLE. pain controlled. esrd on HD. +BM after intervention  Barriers to Discharge: Medical stability   Possible Resolutions to [employer] Focus: daily assessment of labs and pt data, pain control. bowel mgt  Continued Need for Acute Rehabilitation Level of Care: The patient requires daily medical management by a physician with specialized training in physical medicine and rehabilitation for the following reasons: Direction of a multidisciplinary physical rehabilitation program to maximize functional independence : Yes Medical management of patient stability for increased activity during participation in an intensive rehabilitation regime.:  Yes Analysis of laboratory values and/or radiology reports with any subsequent need for medication adjustment and/or medical intervention. : Yes   I attest that I was present, lead the team conference, and concur with the assessment and plan of the team.   Ernest Pine 09/19/2021, 10:24 AM

## 2021-09-17 NOTE — Plan of Care (Signed)
  Problem: RH Toileting Goal: LTG Patient will perform toileting task (3/3 steps) with assistance level (OT) Description: LTG: Patient will perform toileting task (3/3 steps) with assistance level (OT)  Flowsheets (Taken 09/17/2021 1508) LTG: Pt will perform toileting task (3/3 steps) with assistance level: Minimal Assistance - Patient > 75% Note: Goal downgraded 8/15-ESD   Problem: RH Balance Goal: LTG Patient will maintain dynamic standing with ADLs (OT) Description: LTG:  Patient will maintain dynamic standing balance with assist during activities of daily living (OT)  Flowsheets (Taken 09/17/2021 1509) LTG: Pt will maintain dynamic standing balance during ADLs with: Contact Guard/Touching assist   Problem: Sit to Stand Goal: LTG:  Patient will perform sit to stand in prep for activites of daily living with assistance level (OT) Description: LTG:  Patient will perform sit to stand in prep for activites of daily living with assistance level (OT) Flowsheets (Taken 09/17/2021 1509) LTG: PT will perform sit to stand in prep for activites of daily living with assistance level: Contact Guard/Touching assist

## 2021-09-17 NOTE — Progress Notes (Signed)
[facility] Progress Note   Subjective:   Patient seen and examined in room. Reports having a lot of diarrhea after receiving stool softener. Diarrhea resolved now. No other complaints  Objective Vitals:   09/16/21 1843 09/16/21 1944 09/17/21 0500 09/17/21 0529  BP: (!) 109/59 (!) 108/59  132/68  Pulse: 96 (!) 101  99  Resp: 16 17  17   Temp: 98.2 F (36.8 C) 98.4 F (36.9 C)  98.3 F (36.8 C)  TempSrc: Oral Oral  Oral  SpO2: 99% 93%  95%  Weight:   64.5 kg   Height:       Physical Exam General: Alert female in NAD, laying flat in bed Heart: RRR, no murmurs, rubs or gallops Lungs: CTA bilaterally without wheezing,rhonchi or rales Abdomen: Soft, non-distended, +BS Extremities: Trace edema LLE, 1+ nonpitting edema RLE (brace in place) Neuro: awake, alert Dialysis Access: RUE AVF + thrill  Additional Objective Labs: Basic Metabolic Panel: Recent Labs  Lab 09/12/21 1627 09/13/21 1351 09/16/21 1435  NA 130* 127* 122*  K 4.0 4.1 4.0  CL 91* 88* 87*  CO2 27 27 26   GLUCOSE 103* 110* 109*  BUN 44* 50* 48*  CREATININE 5.50* 6.61* 8.37*  CALCIUM 8.3* 8.4* 7.9*  PHOS 3.6 3.6 2.9   Liver Function Tests: Recent Labs  Lab 09/12/21 1627 09/13/21 1351 09/16/21 1435  ALBUMIN 2.1* 2.2* 2.1*   No results for input(s): "LIPASE", "AMYLASE" in the last 168 hours. CBC: Recent Labs  Lab 09/11/21 1630 09/13/21 1351 09/14/21 0617 09/16/21 1435  WBC 7.1 6.8  --  5.8  HGB 6.9* 7.2* 8.0* 7.2*  HCT 21.9* 22.3* 24.9* 22.1*  MCV 86.9 87.5  --  88.8  PLT 113* 140*  --  113*   Blood Culture No results found for: "SDES", "SPECREQUEST", "CULT", "REPTSTATUS"  Cardiac Enzymes: No results for input(s): "CKTOTAL", "CKMB", "CKMBINDEX", "TROPONINI" in the last 168 hours. CBG: No results for input(s): "GLUCAP" in the last 168 hours. Iron Studies: No results for input(s): "IRON", "TIBC", "TRANSFERRIN", "FERRITIN" in the last 72  hours. @lablastinr3 @ Studies/Results: No results found. Medications:   apixaban  2.5 mg Oral BID   atorvastatin  40 mg Oral QHS   Chlorhexidine Gluconate Cloth  6 each Topical Q0600   Chlorhexidine Gluconate Cloth  6 each Topical Q0600   darbepoetin (ARANESP) injection - DIALYSIS  150 mcg Intravenous Q Wed-HD   doxercalciferol  8 mcg Intravenous Q M,W,F-HD   feeding supplement  237 mL Oral BID BM   ferric citrate  210 mg Oral TID WC   guaiFENesin  200 mg Oral Q6H   multivitamin  1 tablet Oral QHS   pantoprazole  40 mg Oral BID   senna-docusate  2 tablet Oral QHS   Vitamin D (Ergocalciferol)  50,000 Units Oral Q7 days    Dialysis Orders: [place] MWF  3h 79min  350/1.5   60.5kg  2/2 bath  P2  Hep 1200  RFA AVF - last Hb 9.5 on 7/26 - doxercalciferol 71mcg IV q HD - Mircera 21mcg IV q 4wks, last 7/24  Assessment/Plan: Fall w/ traumatic extremity Fx: S/p ORIF L distal femur Fx, ORIF L distal radius/ulnar Fx, IM nailing of R open tibial Fx on 08/29/21.  Per ortho. On Eliquis for DVT prophylaxis. Now in CIR 2. COVID: Hypoxic on admit. Finished course of molnupiravir + steroids. Off precautions. 3. ESRD: Continue HD on MWF schedule 4. Hyponatremia: Chronic, but worsening lately. May be volume related, continue UF with  HD as tolerated. 137Na bath w/ HD 5. HTN/volume: BP stable - prior OP meds on hold (losartan, isosorbide, coreg). Uses midodrine only prn with HD, orders adjusted. Having some orthostatic dizziness which correlates with onset of low hemoglobin. Does have some edema on exam (which is slightly better as compared to yesterday), reestablishing EDW.  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 Received Aranesp 150 on 8/9. S/p 2U PRBCs on 7/27 and 1 unit on 09/13/21 Reports dark stool, FOBT still pending 7. Secondary hyperparathyroidism: CorrCa/Phos controlled. Continue VDRA and binder.   8. Nutrition: Alb low, on supplements to help promote wound healing. 9. Dispo: CIR.    Gean Quint, MD [facility]

## 2021-09-17 NOTE — Progress Notes (Addendum)
Patient ID: Kendra Clay, female   DOB: 1960-12-12, 61 y.o.   MRN: [id number]  SW met with pt in rehab gym to provide updates on change in d/c date from 8/17 to 8/22 to allow more gains to be made in rehab. Pt reports her son is working on getting RW here.   76- SW spoke with pt son Sam to inform on above. Reports pt shared this with her. SW discussed if he was able to bring in pt RW for therapies and if the letter can be brought by as well for signature. He reports he will make an effort.  SW received phone call from [place] to discuss transportation. Reports [facility] is still working on getting transportation time approved. SW informed on above changes. SW will provide updates as available.   SW updated [facility] dialysis coord on change in d/c date.   SW ordered L platform attachment and w/c with [facility] via parachute.   Loralee Pacas, MSW, [place] Office: [phone] Cell: [phone] Fax: [phone]

## 2021-09-17 NOTE — Progress Notes (Signed)
Occupational Therapy Weekly Progress Note  Patient Details  Name: Kendra Clay MRN: [id number] Date of Birth: 1960/04/30  Beginning of progress report period: September 10, 2021 End of progress report period: September 17, 2021  Today's Date: 09/17/2021  Session 1 OT Individual Time: 1100-1200 OT Individual Time Calculation (min): 60 min   Session 2 OT Individual Time: 1430-1500 OT Individual Time Calculation (min): 30 min    Patient has met 2 of 3 short term goals.  Patient has made slow progress towards OT goals due to pain and nausea. She still needs mod A for LB ADLs, but is working towards Increased independence using adaptive equipment.   Patient continues to demonstrate the following deficits: muscle weakness and muscle joint tightness and decreased standing balance, decreased balance strategies, and difficulty maintaining precautions and therefore will continue to benefit from skilled OT intervention to enhance overall performance with BADL and Reduce care partner burden.  Patient progressing toward long term goals..  Plan of care revisions: Toileting goal has been downgraded to min A.  OT Short Term Goals Week 1:  OT Short Term Goal 1 (Week 1): Patient will complete toilet transfer with mod A of 1 OT Short Term Goal 1 - Progress (Week 1): Met OT Short Term Goal 2 (Week 1): Patient will complete 1 step of LB dressing task OT Short Term Goal 2 - Progress (Week 1): Met OT Short Term Goal 3 (Week 1): Pt will tolerate standing at the sink with min A in preparation for BADL task OT Short Term Goal 3 - Progress (Week 1): Progressing toward goal Week 2:  OT Short Term Goal 1 (Week 2): LTG=STG 2/2 ELOS  Skilled Therapeutic Interventions/Progress Updates:    Session 1 Pt greeted seated in wc and agreeable to OT treatment session. Worked on wc mobility using L leg and R UE to propel and stear wc. With encouragement and practice, she was able to propel chair 15 feet with supervision and  verbal cues. Functional ambulation using PFRW 10 feet and 2 standing rest breaks-min A. Pt reported her UE's were tired and that was why she needed to stop. OT educated on extension of dc date and importance of pt working hard and pushing herself to meet her goals by dc, pt verbalized understanding. Worked on sit<>stands from wc with min/mod A. Pt returned to room and completed stand-pivot to recliner with mod A to stand and min A to pivot. Pt left seated in recliner with alarm belt on, call bell in reach, and needs met.   Session 2 Pt greeted seated in recliner. Pt wanted to return to bed due to B knee pain and fatigue. Worked on stand-pivot transfers and standing balance within LB dressing task. Mod A and 3 trials to get to standing w/ PFRW as pt reported her knees hurt too bad. With encouragement, pt able to maintain standing using L UE supported on platform while she reached to doff pants with R hand, still min A. OT educated on use of reacher to increase independence with donning/doffing lower body garments. Pt needed mod A to lift B LE's back in bed. Ice placed on B knees and dorsal side of R foot for pain management. Pt left semi-reclined in bed with bed alarm on, call bell in reach and needs met.   Therapy Documentation Precautions:  Precautions Precautions: Fall, Other (comment) Required Braces or Orthoses: Other Brace Other Brace: CAM boot on RLE with transfers/ mobility/ ambulation Restrictions Weight Bearing Restrictions: Yes LUE  Weight Bearing: Non weight bearing RLE Weight Bearing: Weight bearing as tolerated LLE Weight Bearing: Weight bearing as tolerated Pain: Pain Assessment Pain Scale: 0-10 Pain Score: 4  Pain Type: Acute pain Pain Location: Leg Pain Orientation: Left Pain Descriptors / Indicators: Aching Pain Intervention(s): Ice applied, repositioned  Therapy/Group: Individual Therapy  Valma Cava 09/17/2021, 3:08 PM

## 2021-09-17 NOTE — Progress Notes (Signed)
Physical Therapy Weekly Progress Note  Patient Details  Name: Kendra Clay MRN: [id number] Date of Birth: December 31, 1960  Beginning of progress report period: September 10, 2021 End of progress report period: September 17, 2021  Today's Date: 09/17/2021 PT Individual Time: 1443-1540 and [id number] PT Individual Time Calculation (min): 72 min and 72 min  Patient has met 3 of 4 short term goals.  Pt is progressing toward mobility goals, improving independence with bed mobility, balance, transfers, and ambulation. Pt can be self-limiting, frequently requesting to stop activities and requiring encouragement to participate or to persevere through discomfort. Pt is mobility overall at minA level and will benefit from family education prior to discharge.  Patient continues to demonstrate the following deficits muscle weakness, decreased cardiorespiratoy endurance, and decreased sitting balance, decreased standing balance, and decreased balance strategies and therefore will continue to benefit from skilled PT intervention to increase functional independence with mobility.  Patient progressing toward long term goals..  Continue plan of care.  PT Short Term Goals Week 1:  PT Short Term Goal 1 (Week 1): Pt will perform bed mobility with consistent supervision. PT Short Term Goal 1 - Progress (Week 1): Progressing toward goal PT Short Term Goal 2 (Week 1): Pt will perform sit<>stand with Mod A +1 PT Short Term Goal 2 - Progress (Week 1): Met PT Short Term Goal 3 (Week 1): Pt will initiate gait training. PT Short Term Goal 3 - Progress (Week 1): Met PT Short Term Goal 4 (Week 1): Pt will perform seat to seat transfers with Mod A +1. PT Short Term Goal 4 - Progress (Week 1): Met Week 2:  PT Short Term Goal 1 (Week 2): STGs = LTGs  Skilled Therapeutic Interventions/Progress Updates:  Ambulation/gait training;Community reintegration;DME/adaptive equipment instruction;Neuromuscular re-education;Psychosocial  support;Stair training;UE/LE Strength taining/ROM;Wheelchair propulsion/positioning;Balance/vestibular training;Discharge planning;Functional electrical stimulation;Therapeutic Activities;Skin care/wound management;Pain management;UE/LE Coordination activities;Cognitive remediation/compensation;Disease management/prevention;Functional mobility training;Patient/family education;Splinting/orthotics;Therapeutic Exercise;Visual/perceptual remediation/compensation  1st Session: Pt received supine in bed and agrees to therapy. Reports she had a horrible night after having two doses of laxatives. Reports pain "isn't bad". PT provides rest breaks and mobility to manage pain. Pt performs supine to sit with bed features. Stand pivot to WC with modA due to pt requesting not to have to stand up with RW. WC transport to gym for time management. Pt performs stand step transfer to mat with L platform RW and minA. ModA for sit to supine with management of bilateral lower extremities. Pt performs 2x10 quad sets, glute sets, SAQs, and SLRs with AAROM. Supine to sit with modA and cues for sequencing. Stand pivot back to [facility] with minA and cues for initiation, sequencing, and positioning. Pt left with all needs within reach.   2nd Session: Pt received seated in [facility] and agrees to therapy. Reports pain in both legs. PT provides rest breaks and mobility to manage pain. Pt performs sit to stand with minA/modA and cues for hand placement and sequencing. Stand step transfer to [facility] with CGA and cues for increased eccentric control of stand to sit. WC transport to gym for time management. Pt performs sit to stand to L platform RW with minA, then ambulates x14' with CGA with cues posture and encouragement to persevere through discomfort. Pt requires frequent and extended seated rest breaks with legs elevated due to pain and fatigue. Pt ambulates additional bouts of 20' and 16'. Pt requires max encouragement and is often able to do more after  she says "I'm done. I can't do anymore." WC transport  back to room. Pt left seated in WC with all needs within reach.  Therapy Documentation Precautions:  Precautions Precautions: Fall, Other (comment) Required Braces or Orthoses: Other Brace Other Brace: CAM boot on RLE with transfers/ mobility/ ambulation Restrictions Weight Bearing Restrictions: Yes LUE Weight Bearing: Non weight bearing RLE Weight Bearing: Weight bearing as tolerated LLE Weight Bearing: Weight bearing as tolerated  Therapy/Group: Individual Therapy  Breck Coons, PT, DPT 09/17/2021, 6:26 PM

## 2021-09-17 NOTE — Discharge Summary (Signed)
Physician Discharge Summary  Patient ID: Kendra Clay MRN: [id number] DOB/AGE: [birth year] 61 y.o.  Admit date: 09/09/2021 Discharge date: 09/24/2021  Discharge Diagnoses:  Principal Problem:   Multiple fractures of both lower extremities and ribs Active Problems:   COVID-19 Debility secondary to fall resulting in multiple fractures End-stage renal disease Chronic heart failure Emphysema Anemia GERD Hyperlipidemia Labile blood pressure  Discharged Condition: good  Labs:  Basic Metabolic Panel: Recent Labs  Lab 09/18/21 2007 09/20/21 2057 09/23/21 1400  NA 122* 129* 120*  K 3.6 4.0 3.7  CL 86* 94* 85*  CO2 26 24 24   GLUCOSE 90 79 103*  BUN 39* 14 33*  CREATININE 7.37* 3.20* 7.39*  CALCIUM 7.9* 8.3* 8.3*  PHOS 3.1 2.4* 2.5    CBC: Recent Labs  Lab 09/18/21 2007 09/20/21 2205 09/23/21 1400  WBC 3.9* 3.4* 4.6  HGB 7.1* 7.3* 8.0*  HCT 22.0* 22.9* 25.2*  MCV 88.4 90.9 91.6  PLT 107* 123* 145*    Brief HPI:   Kendra Clay is a 61 y.o. female history of end-stage renal disease on chronic intermittent hemodialysis who fell at home on 08/29/2021.  She suffered an open right tibial shaft fracture and underwent intramedullary nailing.  She underwent ORIF of left distal femur fracture and ORIF of left distal radius fracture.  She was diagnosed with COVID-19 pneumonia and treated with anti-viral prednisone.   Hospital Course: Kendra Clay was admitted to rehab 09/09/2021 for inpatient therapies to consist of PT, ST and OT at least three hours five days a week. Past admission physiatrist, therapy team and rehab RN have worked together to provide customized collaborative inpatient rehab.  She is weightbearing as tolerated on the lower extremities and nonweightbearing to the wrist on the left upper extremity.  Dialysis continued on Monday, Wednesday, Friday schedule and followed by nephrology.  Hemoglobin drifted to 6.9 and 1 unit packed red blood cells given during  dialysis on 8/11.  Decreased appetite and p.o. intake noted and dietary consulted.  Prosource continued.  Pain controlled on Tylenol and minimal doses of oxycodone. No complaints on day of discharge. She underwent HD on 8/22 prior to discharge with plans to resume outpatient HD schedule on Friday.   Blood pressures were monitored on TID basis and hydralazine 10 mg q 6 hours prn sys BP > 170. She had intermittent dizziness and nausea which improved with adequate po fluids and rest breaks             -midodrine 5 mg as needed only at the start of dialysis  Rehab course: During patient's stay in rehab weekly team conferences were held to monitor patient's progress, set goals and discuss barriers to discharge. At admission, patient required moderate assistance for bed mobility.  No ambulation occurred due to safety/medical concerns.  Following day, she was able to transfer with mod A/max A.   She  has had improvement in activity tolerance, balance, postural control as well as ability to compensate for deficits. She has had improvement in functional use RUE/LUE  and RLE/LLE as well as improvement in awareness. Discharged at supervision level.  Disposition: Home Discharge disposition: 01-Home or Self Care     Diet: renal with 120 cc Fr  Special Instructions: No driving, alcohol consumption or tobacco use.   30-35 minutes were spent on discharge planning and discharge summary.   Discharge Instructions     Discharge patient   Complete by: As directed    Discharge disposition: 01-Home or  Self Care   Discharge patient date: 09/24/2021      Allergies as of 09/24/2021       Reactions   E-mycin [erythromycin] Hives   Sulfa Antibiotics Rash   Other reaction(s): Skin Rash Other reaction(s): Skin Rash   Lisinopril Cough        Medication List     STOP taking these medications    (feeding supplement) PROSource Plus liquid       TAKE these medications    acetaminophen 325 MG  tablet Commonly known as: TYLENOL Take 1-2 tablets (325-650 mg total) by mouth every 4 (four) hours as needed for mild pain. What changed:  when to take this reasons to take this   apixaban 2.5 MG Tabs tablet Commonly known as: ELIQUIS Take 1 tablet (2.5 mg total) by mouth 2 (two) times daily for 6 days.   atorvastatin 40 MG tablet Commonly known as: LIPITOR Take 40 mg by mouth at bedtime.   cinacalcet 30 MG tablet Commonly known as: SENSIPAR Take 30 mg by mouth daily with supper.   Darbepoetin Alfa 150 MCG/0.3ML Sosy injection Commonly known as: ARANESP Inject 0.3 mLs (150 mcg total) into the vein every Wednesday with hemodialysis. Start taking on: September 25, 2021   dextromethorphan-guaiFENesin 30-600 MG 12hr tablet Commonly known as: MUCINEX DM Take 2 tablets by mouth as needed for cough.   doxercalciferol 4 MCG/2ML injection Commonly known as: HECTOROL Inject 4 mLs (8 mcg total) into the vein every Monday, Wednesday, and Friday with hemodialysis. Start taking on: September 25, 2021   ferric citrate 1 GM 210 MG(Fe) tablet Commonly known as: AURYXIA Take 210 mg by mouth 3 (three) times daily with meals.   lidocaine 5 % Commonly known as: LIDODERM Place 1 patch onto the skin daily. Remove & Discard patch within 12 hours or as directed by MD   loperamide 2 MG tablet Commonly known as: IMODIUM A-D Take 2 mg by mouth as needed for diarrhea or loose stools.   loratadine 10 MG tablet Commonly known as: CLARITIN Take 10 mg by mouth daily as needed for allergies.   methocarbamol 500 MG tablet Commonly known as: ROBAXIN Take 1 tablet (500 mg total) by mouth every 6 (six) hours as needed for muscle spasms.   midodrine 5 MG tablet Commonly known as: PROAMATINE Take 1 tablet (5 mg total) by mouth as needed (for hypotension during dialysis).   multivitamin Tabs tablet Take 1 tablet by mouth at bedtime.   nitroGLYCERIN 0.4 MG SL tablet Commonly known as: NITROSTAT Place  0.4 mg under the tongue every 5 (five) minutes as needed for chest pain.   oxyCODONE 5 MG immediate release tablet Commonly known as: Oxy IR/ROXICODONE Take 1 tablet (5 mg total) by mouth every 8 (eight) hours as needed for severe pain. What changed:  how much to take when to take this reasons to take this   pantoprazole 40 MG tablet Commonly known as: PROTONIX Take 40 mg by mouth 2 (two) times daily.   polyethylene glycol powder 17 GM/SCOOP powder Commonly known as: MiraLax Start taking 1 capful 3 times a day. Slowly cut back as needed until you have normal bowel movements. What changed:  how much to take how to take this when to take this reasons to take this additional instructions   Ventolin HFA 108 (90 Base) MCG/ACT inhaler Generic drug: albuterol INHALE 2 PUFFS INTO THE LUNGS EVERY 6 HOURS AS NEEDED   Vitamin D (Ergocalciferol) 1.25 MG (50000 UNIT)  Caps capsule Commonly known as: DRISDOL Take 1 capsule (50,000 Units total) by mouth every 7 (seven) days.        Follow-up Information     Haddix, Thomasene Lot, MD Follow up.   Specialty: Orthopedic Surgery Why: Call in 1-2 days to make arrangements for hospital follow-up and suture removal Contact information: [place] [address] [phone]         Flossie Buffy., MD. Call.   Specialty: Cardiology Why: Call in 1-2 days to make arrangements for hospital follow-up Contact information: [place] [address] [place], [place], DO Follow up.   Specialty: Physical Medicine and Rehabilitation Why: As needed Contact information: [address] [place] [address] [phone]                 Signed: Barbie Banner 09/24/2021, 12:49 PM

## 2021-09-17 NOTE — Progress Notes (Signed)
PROGRESS NOTE   Subjective/Complaints:  Loose stool has subsided. Pain under control.   ROS: Patient denies fever, rash, sore throat, blurred vision, dizziness, nausea, vomiting,  cough, shortness of breath or chest pain, joint or back/neck pain, headache, or mood change.    Objective:   No results found. Recent Labs    09/16/21 1435  WBC 5.8  HGB 7.2*  HCT 22.1*  PLT 113*   Recent Labs    09/16/21 1435  NA 122*  K 4.0  CL 87*  CO2 26  GLUCOSE 109*  BUN 48*  CREATININE 8.37*  CALCIUM 7.9*    Intake/Output Summary (Last 24 hours) at 09/17/2021 0951 Last data filed at 09/17/2021 0742 Gross per 24 hour  Intake 526 ml  Output 1.2 ml  Net 524.8 ml        Physical Exam: Vital Signs Blood pressure 132/68, pulse 99, temperature 98.3 F (36.8 C), temperature source Oral, resp. rate 17, height 5\' 3"  (1.6 m), weight 64.5 kg, SpO2 95 %.  Constitutional: No distress . Vital signs reviewed. HEENT: NCAT, EOMI, oral membranes moist Neck: supple Cardiovascular: RRR without murmur. No JVD    Respiratory/Chest: CTA Bilaterally without wheezes or rales. Normal effort    GI/Abdomen: BS +, non-tender, non-distended Ext: no clubbing, cyanosis, or edema Psych: pleasant and cooperative  Skin: No evidence of breakdown, no evidence of rash Bruising bilateral legs.  Neuro:  Alert and oriented x 3. Normal insight and awareness. Intact Memory. Follows commands. Normal language and speech. Cranial nerve exam unremarkable. Motor limited by pain in BLE and LUE but appears intact Musculoskeletal: multifocal swelling along bilateral LE and LUE. Left wrist swelling decreased. Splint is snug and in appropriate alignment. Left wrist sl tender with palpation.    Assessment/Plan: 1. Functional deficits which require 3+ hours per day of interdisciplinary therapy in a comprehensive inpatient rehab setting. Physiatrist is providing close  team supervision and 24 hour management of active medical problems listed below. Physiatrist and rehab team continue to assess barriers to discharge/monitor patient progress toward functional and medical goals  Care Tool:  Bathing    Body parts bathed by patient: Face   Body parts bathed by helper: Right arm, Buttocks, Right upper leg, Right lower leg, Left upper leg, Left lower leg     Bathing assist Assist Level: Supervision/Verbal cueing     Upper Body Dressing/Undressing Upper body dressing   What is the patient wearing?: Pull over shirt    Upper body assist Assist Level: Moderate Assistance - Patient 50 - 74%    Lower Body Dressing/Undressing Lower body dressing      What is the patient wearing?: Pants, Incontinence brief     Lower body assist Assist for lower body dressing: Maximal Assistance - Patient 25 - 49%     Toileting Toileting Toileting Activity did not occur (Clothing management and hygiene only): N/A (no void or bm)  Toileting assist Assist for toileting: Dependent - Patient 0%     Transfers Chair/bed transfer  Transfers assist  Chair/bed transfer activity did not occur: Safety/medical concerns  Chair/bed transfer assist level: Minimal Assistance - Patient > 75% Chair/bed transfer assistive device: Armrests, Walker,  Orthosis   Locomotion Ambulation   Ambulation assist   Ambulation activity did not occur: Safety/medical concerns  Assist level: Contact Guard/Touching assist Assistive device: Walker-platform (CAM boot) Max distance: 4'   Walk 10 feet activity   Assist  Walk 10 feet activity did not occur: Safety/medical concerns        Walk 50 feet activity   Assist Walk 50 feet with 2 turns activity did not occur: Safety/medical concerns         Walk 150 feet activity   Assist Walk 150 feet activity did not occur: Safety/medical concerns         Walk 10 feet on uneven surface  activity   Assist Walk 10 feet on  uneven surfaces activity did not occur: Safety/medical concerns         Wheelchair     Assist Is the patient using a wheelchair?: Yes (Per PT long term goals) Type of Wheelchair: Manual Wheelchair activity did not occur: Safety/medical concerns         Wheelchair 50 feet with 2 turns activity    Assist    Wheelchair 50 feet with 2 turns activity did not occur: Safety/medical concerns       Wheelchair 150 feet activity     Assist  Wheelchair 150 feet activity did not occur: Safety/medical concerns       Blood pressure 132/68, pulse 99, temperature 98.3 F (36.8 C), temperature source Oral, resp. rate 17, height 5\' 3"  (1.6 m), weight 64.5 kg, SpO2 95 %.  Medical Problem List and Plan: 1. Functional deficits secondary to debility secondary to multiple fractures after fall underwent open reduction internal fixation of left distal femur fracture and ORIF of left distal radius fracture             -patient may not shower             -ELOS/Goals: 8/17, PT/OT mod I           -Continue CIR therapies including PT and OT. Interdisciplinary team conference today to discuss goals, barriers to discharge, and dc planning.  2.  Antithrombotics: -DVT/anticoagulation:  Pharmaceutical: Eliquis             -antiplatelet therapy: none 3. Pain Management: Tylenol, oxycodone as needed 4. Mood/Behavior/Sleep: LCSW to evaluate and provide emotional support             -antipsychotic agents: n/a 5. Neuropsych/cognition: This patient is capable of making decisions on her own behalf. 6. Skin/Wound Care: Routine skin care checks             -monitor surgical incisions..sutures out soon 7. Fluids/Electrolytes/Nutrition: Strict Is and Os; nephrology following             -daily weights             -renal diet/1200 cc fluid restriction ongoing 8: Left femur fracture s/p ORIF: WBAT 9:Left distal radius and ulnar fracture s/p ORIF             -Nonweightbearing through the wrist,  continue splint  -there is mild tenderness but splint is appropriately fitting.   -encouraged her to avoid using wrist.  10: Right open tibial fracture s/p IM nail:WBAT             -wear CAM boot with therapy/mobilization 11: ESRD on HD: M/W/F schedule per nephrology, she has a RUE AVF  -HD after therapies to allow for therapy participation 12: CAD s/p BMS to LAD in 2017: Stable,  denies chest pain             -follows with Dr. Otho Perl (last seen 05/2021) 13: Covid pneumonia: treated and off precautions, denies SOB             -continue FV; Mucinex  -sx  improved  -8/10 Scheduled Mucinex 14: Vitamin D deficiency: continue supplementation 15: Chronic heart failure (EF 40% in 2017): stable (normal left ventricular systolic function)            Filed Weights   09/16/21 0520 09/16/21 1350 09/17/21 0500  Weight: 72.4 kg 68.2 kg 64.5 kg    Weights up an down with HD 16: Emphysema: FV/IS 17: Pancytopenia,   18: Anemia: CKD/ABL              -continue Aranesp, Auryxia per nephrology             -HGB down to 7.2 again 8/14  -still waiting on OB  -blood transfusion as needed with HD 19: GERD: continue Protonix 20: Hyperlipidemia: continue Lipitor 21: Labile BP: hydralazine 10 mg q 6 hours prn sys BP > 170             -midodrine 5 mg as needed only at the start of dialysis   -observe for next session, bp elevated at baseline today  -bp and HR controlled 22. Constipation  -+ results with sorbitol 8/14   -continue senna-s 23. Nausea  -prn zofran, reglan 24. Decreased PO intake  -Dietician consult  -Continue prosource plus  -have discussed importance of keeping bowels moving also   LOS: 8 days A FACE TO FACE EVALUATION WAS PERFORMED  Meredith Staggers 09/17/2021, 9:51 AM

## 2021-09-17 NOTE — Progress Notes (Signed)
Update provided to [facility] regarding pt's d/c date being changed to 8/22.   Melven Sartorius Renal Navigator [phone]

## 2021-09-18 LAB — RENAL FUNCTION PANEL
Albumin: 1.9 g/dL — ABNORMAL LOW (ref 3.5–5.0)
Anion gap: 10 (ref 5–15)
BUN: 39 mg/dL — ABNORMAL HIGH (ref 6–20)
CO2: 26 mmol/L (ref 22–32)
Calcium: 7.9 mg/dL — ABNORMAL LOW (ref 8.9–10.3)
Chloride: 86 mmol/L — ABNORMAL LOW (ref 98–111)
Creatinine, Ser: 7.37 mg/dL — ABNORMAL HIGH (ref 0.44–1.00)
GFR, Estimated: 6 mL/min — ABNORMAL LOW (ref >60)
Glucose, Bld: 90 mg/dL (ref 70–99)
Phosphorus: 3.1 mg/dL (ref 2.5–4.6)
Potassium: 3.6 mmol/L (ref 3.5–5.1)
Sodium: 122 mmol/L — ABNORMAL LOW (ref 135–145)

## 2021-09-18 LAB — CBC
HCT: 22 % — ABNORMAL LOW (ref 36.0–46.0)
Hemoglobin: 7.1 g/dL — ABNORMAL LOW (ref 12.0–15.0)
MCH: 28.5 pg (ref 26.0–34.0)
MCHC: 32.3 g/dL (ref 30.0–36.0)
MCV: 88.4 fL (ref 80.0–100.0)
Platelets: 107 10*3/uL — ABNORMAL LOW (ref 150–400)
RBC: 2.49 MIL/uL — ABNORMAL LOW (ref 3.87–5.11)
RDW: 18.3 % — ABNORMAL HIGH (ref 11.5–15.5)
WBC: 3.9 10*3/uL — ABNORMAL LOW (ref 4.0–10.5)
nRBC: 0 % (ref 0.0–0.2)

## 2021-09-18 NOTE — Progress Notes (Signed)
PROGRESS NOTE   Subjective/Complaints:  Tired. Didn't sleep too well. Knees and arms are sore  ROS: Patient denies fever, rash, sore throat, blurred vision, dizziness, nausea, vomiting, diarrhea, cough, shortness of breath or chest pain,   headache, or mood change.      Objective:   No results found. Recent Labs    09/16/21 1435  WBC 5.8  HGB 7.2*  HCT 22.1*  PLT 113*   Recent Labs    09/16/21 1435  NA 122*  K 4.0  CL 87*  CO2 26  GLUCOSE 109*  BUN 48*  CREATININE 8.37*  CALCIUM 7.9*    Intake/Output Summary (Last 24 hours) at 09/18/2021 0811 Last data filed at 09/18/2021 0740 Gross per 24 hour  Intake 476 ml  Output --  Net 476 ml        Physical Exam: Vital Signs Blood pressure (!) 141/77, pulse 94, temperature 98.1 F (36.7 C), temperature source Oral, resp. rate 17, height 5\' 3"  (1.6 m), weight 67 kg, SpO2 96 %.  Constitutional: No distress . Vital signs reviewed. HEENT: NCAT, EOMI, oral membranes moist Neck: supple Cardiovascular: RRR without murmur. No JVD    Respiratory/Chest: CTA Bilaterally without wheezes or rales. Normal effort    GI/Abdomen: BS +, non-tender, non-distended Ext: no clubbing, cyanosis  Psych: pleasant and cooperative  Skin: No evidence of breakdown, no evidence of rash Bruising bilateral legs is improving.   Neuro:  Alert and oriented x 3. Normal insight and awareness. Intact Memory. Follows commands. Normal language and speech. Cranial nerve exam unremarkable. Motor limited by pain in BLE and LUE but appears intact Musculoskeletal: multifocal swelling along bilateral LE and LUE. Left wrist swelling decreased. Splint is snug and in appropriate alignment. Left wrist sl tender with palpation.    Assessment/Plan: 1. Functional deficits which require 3+ hours per day of interdisciplinary therapy in a comprehensive inpatient rehab setting. Physiatrist is providing close team  supervision and 24 hour management of active medical problems listed below. Physiatrist and rehab team continue to assess barriers to discharge/monitor patient progress toward functional and medical goals  Care Tool:  Bathing    Body parts bathed by patient: Face   Body parts bathed by helper: Right arm, Buttocks, Right upper leg, Right lower leg, Left upper leg, Left lower leg     Bathing assist Assist Level: Supervision/Verbal cueing     Upper Body Dressing/Undressing Upper body dressing   What is the patient wearing?: Pull over shirt    Upper body assist Assist Level: Moderate Assistance - Patient 50 - 74%    Lower Body Dressing/Undressing Lower body dressing      What is the patient wearing?: Pants, Incontinence brief     Lower body assist Assist for lower body dressing: Maximal Assistance - Patient 25 - 49%     Toileting Toileting Toileting Activity did not occur (Clothing management and hygiene only): N/A (no void or bm)  Toileting assist Assist for toileting: Dependent - Patient 0%     Transfers Chair/bed transfer  Transfers assist  Chair/bed transfer activity did not occur: Safety/medical concerns  Chair/bed transfer assist level: Minimal Assistance - Patient > 75% Chair/bed transfer  assistive device: Armrests, Walker, Orthosis   Locomotion Ambulation   Ambulation assist   Ambulation activity did not occur: Safety/medical concerns  Assist level: Contact Guard/Touching assist Assistive device: Walker-platform (CAM boot) Max distance: 4'   Walk 10 feet activity   Assist  Walk 10 feet activity did not occur: Safety/medical concerns        Walk 50 feet activity   Assist Walk 50 feet with 2 turns activity did not occur: Safety/medical concerns         Walk 150 feet activity   Assist Walk 150 feet activity did not occur: Safety/medical concerns         Walk 10 feet on uneven surface  activity   Assist Walk 10 feet on uneven  surfaces activity did not occur: Safety/medical concerns         Wheelchair     Assist Is the patient using a wheelchair?: Yes (Per PT long term goals) Type of Wheelchair: Manual Wheelchair activity did not occur: Safety/medical concerns         Wheelchair 50 feet with 2 turns activity    Assist    Wheelchair 50 feet with 2 turns activity did not occur: Safety/medical concerns       Wheelchair 150 feet activity     Assist  Wheelchair 150 feet activity did not occur: Safety/medical concerns       Blood pressure (!) 141/77, pulse 94, temperature 98.1 F (36.7 C), temperature source Oral, resp. rate 17, height 5\' 3"  (1.6 m), weight 67 kg, SpO2 96 %.  Medical Problem List and Plan: 1. Functional deficits secondary to debility secondary to multiple fractures after fall underwent open reduction internal fixation of left distal femur fracture and ORIF of left distal radius fracture             -patient may not shower             -ELOS/Goals:  PT/OT mod I           -Continue CIR therapies including PT, OT   -encouraged pt to work through her pain to the best of her ability 2.  Antithrombotics: -DVT/anticoagulation:  Pharmaceutical: Eliquis             -antiplatelet therapy: none 3. Pain Management: Tylenol, oxycodone as needed 4. Mood/Behavior/Sleep: LCSW to evaluate and provide emotional support             -antipsychotic agents: n/a 5. Neuropsych/cognition: This patient is capable of making decisions on her own behalf. 6. Skin/Wound Care: Routine skin care checks             -monitor surgical incisions..sutures out soon 7. Fluids/Electrolytes/Nutrition: Strict Is and Os; nephrology following             -daily weights             -renal diet/1200 cc fluid restriction ongoing 8: Left femur fracture s/p ORIF: WBAT 9:Left distal radius and ulnar fracture s/p ORIF             -Nonweightbearing through the wrist, continue splint  -there is mild tenderness but  splint is appropriately fitting.   -encouraged her to avoid using wrist.  10: Right open tibial fracture s/p IM nail:WBAT             -wear CAM boot with therapy/mobilization 11: ESRD on HD: M/W/F schedule per nephrology, she has a RUE AVF  -HD after therapies to allow for therapy participation 12: CAD s/p BMS  to LAD in 2017: Stable, denies chest pain             -follows with Dr. Otho Perl (last seen 05/2021) 13: Covid pneumonia: treated and off precautions, denies SOB             -continue FV; Mucinex  -sx  improved  -8/10 Scheduled Mucinex helpful 14: Vitamin D deficiency: continue supplementation 15: Chronic heart failure (EF 40% in 2017): stable (normal left ventricular systolic function)            Filed Weights   09/16/21 1350 09/17/21 0500 09/18/21 0500  Weight: 68.2 kg 64.5 kg 67 kg    Weights up an down with HD 16: Emphysema: FV/IS 17: Pancytopenia,   18: Anemia: CKD/ABL              -continue Aranesp, Auryxia per nephrology             -HGB down to 7.2 again 8/14  -still waiting on OB  -blood transfusion as needed with HD 19: GERD: continue Protonix 20: Hyperlipidemia: continue Lipitor 21: Labile BP: hydralazine 10 mg q 6 hours prn sys BP > 170             -midodrine 5 mg as needed only at the start of dialysis   -observe for next session, bp elevated at baseline today  -bp and HR controlled 8/16 22. Constipation  -+ results with sorbitol 8/14  -continue senna-s 23. Nausea  -prn zofran, reglan. Some of this is functional 24. Decreased PO intake  -Dietician consult  -Continue prosource plus  -have discussed importance of keeping bowels moving also   LOS: 9 days A FACE TO FACE EVALUATION WAS PERFORMED  Meredith Staggers 09/18/2021, 8:11 AM

## 2021-09-18 NOTE — Progress Notes (Signed)
Physical Therapy Session Note  Patient Details  Name: Kendra Clay MRN: [id number] Date of Birth: 11/17/1960  Today's Date: 09/18/2021 PT Individual Time: 0904-1000 PT Individual Time Calculation (min): 56 min   Short Term Goals: Week 2:  PT Short Term Goal 1 (Week 2): STGs = LTGs  Skilled Therapeutic Interventions/Progress Updates:     Patient in w/c in the room upon PT arrival. Patient alert and agreeable to PT session. Patient reported 1-5/10 (variable based on activity performed) B lower extremity and L arm pain during session, RN made aware. PT provided repositioning, rest breaks, and distraction as pain interventions throughout session.   Discussed home set-up and d/c planning. Patient reports she was not sleeping in her bed PTA due to a previous sacral fx. States she was sleeping in a arm chair with her legs propped on another chair. Discussed safety concerns and increased discomfort with this set-up in her current condition. Patient reports a hospital bed could be set-up in her living room and her son is already planning on having this set-up. CSW made aware of need for hospital bed at d/c.   Therapeutic Activity: Bed Mobility: Patient performed sit to supine with min A for lower extremity management with use of hospital bed functions to simulate home hospital bed use. Provided verbal cues for use of R arm for trunk control and bringing knees to chest to lift legs onto the bed. Transfers: Patient performed sit to/from stand and a short, 4 foot, ambulatory transfer with min A using L PFRW. Provided verbal cues for scooting forward, forward weight shift, management of AD, and reaching back to control descent.  Wheelchair Mobility:  Patient propelled wheelchair 50 feet with supervision. Provided mod verbal cues for propulsion and 90 turn technique using R upper and L lower extremities.   Therapeutic Exercise: Patient performed the following exercises with verbal and tactile cues for  proper technique. -B ankle pumps x20 -B quad sets 2x10 -B hip abduction, AROM on R AAROM on L, 2x10 -B glute sets 2x10  Patient in bed at end of session with breaks locked, bed alarm set, and all needs within reach.   Therapy Documentation Precautions:  Precautions Precautions: Fall, Other (comment) Required Braces or Orthoses: Other Brace Other Brace: CAM boot on RLE with transfers/ mobility/ ambulation Restrictions Weight Bearing Restrictions: Yes LUE Weight Bearing: Non weight bearing RLE Weight Bearing: Weight bearing as tolerated LLE Weight Bearing: Weight bearing as tolerated    Therapy/Group: Individual Therapy  Nature Kueker L Vernesha Talbot PT, DPT, NCS, CBIS  09/18/2021, 4:08 PM

## 2021-09-18 NOTE — Progress Notes (Signed)
Occupational Therapy Session Note  Patient Details  Name: Kendra Clay MRN: [id number] Date of Birth: 1960-09-01  Today's Date: 09/18/2021 OT Individual Time: 1051-1200 OT Individual Time Calculation (min): 69 min    Short Term Goals: Week 2:  OT Short Term Goal 1 (Week 2): LTG=STG 2/2 ELOS  Skilled Therapeutic Interventions/Progress Updates:    Patient received supine in bed.  Patient reports pain as "not to bad" initially.  Patient winced at times during session if left forearm made contact with a surface.  Patient needs consistent encouragement to attempt to complete BADL tasks without assistance.  Patient did best when given extra time and therapist not immediately in line of sight.  Patient required max assist to stand from seated position - first stand for the day. This clinician not known to patient.  Bed slightly elevated,  scoot forward to have BLE in contact with ground, and cueing/facilitation for forward weight shift.  Patient with limited push through legs for initial sit to stand.  Once on her feet able to walk from bed to sink, although halfway there reported she could not go further.  With encouragement - patient able to walk to sink to complete bathing, dressing and grooming. Patient with limited functional attempts to use LUE as a gross assist.   Transported patient to tub room to discuss her process for showering prior to this fall. Patient reports having tub seat with back.  Showed patient tub bench and highlighted that based on current mobility status this may be a safer option- patient listened, but still feels she will be fine with tub seat or forego showering.   Patient taken abck to room - safety belt in place and engaged, call bell and personal items in reach.    Therapy Documentation Precautions:  Precautions Precautions: Fall, Other (comment) Required Braces or Orthoses: Other Brace Other Brace: CAM boot on RLE with transfers/ mobility/  ambulation Restrictions Weight Bearing Restrictions: Yes LUE Weight Bearing: Non weight bearing RLE Weight Bearing: Weight bearing as tolerated LLE Weight Bearing: Weight bearing as tolerated  Pain: Pain Assessment Pain Scale: 0-10 Pain Score: 3  Pain Type: Acute pain Pain Location: Arm Pain Orientation: Left Pain Intervention(s): Medication (See eMAR)     Therapy/Group: Individual Therapy  Mariah Milling 09/18/2021, 12:21 PM

## 2021-09-18 NOTE — Progress Notes (Signed)
Patient ID: Kendra Clay, female   DOB: 09-20-1960, 61 y.o.   MRN: [id number]  Per therapy team, pt will need hospital bed. SW ordered item with [employer] via parachute.   SW met with pt in room to discuss [facility] preference. Prefers [facility] or [facility]. Pt DME delivered to room: rollator and RW. She also received the forms to sign for [facility], and unsure on when someone will be able to come and pick up forms to drop off. She will speak with her son about this.   SW left message for [facility] [phone]) to follow-up about referral and see if her dialysis seat time has been approved.  SW waiting on follow-up.   SW sent HHPT/OT/aide to [facility] and waiting on follow-up.  Loralee Pacas, MSW, [place] Office: [phone] Cell: [phone] Fax: [phone]

## 2021-09-18 NOTE — Progress Notes (Signed)
Report given to Renal floor RN. RN states transport will be ready in a hour. Pt is resting a/o times 3 resp even and unlabored. Vitals are WNL. No acute distress noted at the time.   0044 Pt transported to Dialysis with transport. Pt is stable at the time of transfer.

## 2021-09-18 NOTE — Progress Notes (Signed)
[facility] Progress Note   Subjective:   No complaints this am. Occasional nausea. No diarrhea today. Dialysis today.    Objective Vitals:   09/17/21 0529 09/17/21 2124 09/18/21 0446 09/18/21 0500  BP: 132/68 (!) 140/68 (!) 141/77   Pulse: 99 (!) 101 94   Resp: 17 18 17    Temp: 98.3 F (36.8 C) 98.9 F (37.2 C) 98.1 F (36.7 C)   TempSrc: Oral Oral Oral   SpO2: 95% 98% 96%   Weight:    67 kg  Height:       Physical Exam General: Alert female, nad  Heart: RRR, no murmurs, rubs or gallops Lungs: CTA bilaterally without wheezing,rhonchi or rales Abdomen: Soft, non-distended, +BS Extremities: Trace edema LLE, 1+ nonpitting edema RLE (brace in place) Neuro: awake, alert Dialysis Access: RUE AVF + thrill  Additional Objective Labs: Basic Metabolic Panel: Recent Labs  Lab 09/12/21 1627 09/13/21 1351 09/16/21 1435  NA 130* 127* 122*  K 4.0 4.1 4.0  CL 91* 88* 87*  CO2 27 27 26   GLUCOSE 103* 110* 109*  BUN 44* 50* 48*  CREATININE 5.50* 6.61* 8.37*  CALCIUM 8.3* 8.4* 7.9*  PHOS 3.6 3.6 2.9    Liver Function Tests: Recent Labs  Lab 09/12/21 1627 09/13/21 1351 09/16/21 1435  ALBUMIN 2.1* 2.2* 2.1*    No results for input(s): "LIPASE", "AMYLASE" in the last 168 hours. CBC: Recent Labs  Lab 09/11/21 1630 09/13/21 1351 09/14/21 0617 09/16/21 1435  WBC 7.1 6.8  --  5.8  HGB 6.9* 7.2* 8.0* 7.2*  HCT 21.9* 22.3* 24.9* 22.1*  MCV 86.9 87.5  --  88.8  PLT 113* 140*  --  113*    Blood Culture No results found for: "SDES", "SPECREQUEST", "CULT", "REPTSTATUS"  Cardiac Enzymes: No results for input(s): "CKTOTAL", "CKMB", "CKMBINDEX", "TROPONINI" in the last 168 hours. CBG: No results for input(s): "GLUCAP" in the last 168 hours. Iron Studies: No results for input(s): "IRON", "TIBC", "TRANSFERRIN", "FERRITIN" in the last 72 hours. @lablastinr3 @ Studies/Results: No results found. Medications:   apixaban  2.5 mg Oral BID   atorvastatin  40  mg Oral QHS   Chlorhexidine Gluconate Cloth  6 each Topical Q0600   Chlorhexidine Gluconate Cloth  6 each Topical Q0600   darbepoetin (ARANESP) injection - DIALYSIS  150 mcg Intravenous Q Wed-HD   doxercalciferol  8 mcg Intravenous Q M,W,F-HD   feeding supplement  237 mL Oral BID BM   ferric citrate  210 mg Oral TID WC   guaiFENesin  200 mg Oral Q6H   multivitamin  1 tablet Oral QHS   pantoprazole  40 mg Oral BID   senna-docusate  2 tablet Oral QHS   Vitamin D (Ergocalciferol)  50,000 Units Oral Q7 days    Dialysis Orders: [facility] MWF  3h 23min  350/1.5   60.5kg  2/2 bath  P2  Hep 1200  RFA AVF - last Hb 9.5 on 7/26 - doxercalciferol 12mcg IV q HD - Mircera 32mcg IV q 4wks, last 7/24  Assessment/Plan: Fall w/ traumatic extremity Fx: S/p ORIF L distal femur Fx, ORIF L distal radius/ulnar Fx, IM nailing of R open tibial Fx on 08/29/21.  Per ortho. On Eliquis for DVT prophylaxis. Now in CIR 2. COVID: Hypoxic on admit. Finished course of molnupiravir + steroids. Off precautions. 3. ESRD: Continue HD on MWF schedule 4. Hyponatremia: Chronic, but worsening lately. May be volume related, continue UF with HD as tolerated. 137Na bath w/ HD 5. HTN/volume: BP  stable - prior OP meds on hold (losartan, isosorbide, coreg). Uses midodrine only prn with HD, orders adjusted. Having some orthostatic dizziness which correlates with onset of low hemoglobin. Does have some edema on exam (which is slightly better as compared to yesterday), reestablishing EDW.  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 Received Aranesp 150 on 8/9. S/p 2U PRBCs on 7/27 and 1 unit on 09/13/21 Reports dark stool, FOBT still pending 7. Secondary hyperparathyroidism: CorrCa/Phos controlled. Continue VDRA and binder.   8. Nutrition: Alb low, on supplements to help promote wound healing. 9. Dispo: CIR.   Lynnda Child PA-C [facility] 09/18/2021,8:51 AM

## 2021-09-18 NOTE — Progress Notes (Signed)
Occupational Therapy Session Note  Patient Details  Name: Kendra Clay MRN: [id number] Date of Birth: Apr 04, 1960  Today's Date: 09/18/2021 OT Individual Time: 1051-1200 OT Individual Time Calculation (min): 69 min    Short Term Goals: Week 1:  OT Short Term Goal 1 (Week 1): Patient will complete toilet transfer with mod A of 1 OT Short Term Goal 1 - Progress (Week 1): Met OT Short Term Goal 2 (Week 1): Patient will complete 1 step of LB dressing task OT Short Term Goal 2 - Progress (Week 1): Met OT Short Term Goal 3 (Week 1): Pt will tolerate standing at the sink with min A in preparation for BADL task OT Short Term Goal 3 - Progress (Week 1): Progressing toward goal  Skilled Therapeutic Interventions/Progress Updates:    Pt received in bed slowly agreeable to OT after recounting what had already completed this date. Pt completes bed mobility with MIN A and MAX A sit to stand and then MIN A to pivot to w/c with PFRW  Kinetron 4x2 min on and varying rest break length with BLE on 70cm/sec velocity. Pt needs encouragement to get started.   Functional mobility to/from regular chair 2x10 feet with PFRW and min-MOD A for mobility for improved activity tolerance. Pt able ot complete same mobility back to bed and is able to stand at EOB and pull pants past hips in standing with MIN A for balance.   Exited session with pt seated in bed, exit alarm on and call light in reach   Therapy Documentation Precautions:  Precautions Precautions: Fall, Other (comment) Required Braces or Orthoses: Other Brace Other Brace: CAM boot on RLE with transfers/ mobility/ ambulation Restrictions Weight Bearing Restrictions: Yes LUE Weight Bearing: Non weight bearing RLE Weight Bearing: Weight bearing as tolerated LLE Weight Bearing: Weight bearing as tolerated General:   Vital Signs:  Pain: Pain Assessment Pain Scale: 0-10 Pain Score: 3  Pain Type: Acute pain Pain Location: Arm Pain  Orientation: Left Pain Intervention(s): Medication (See eMAR)  Therapy/Group: Individual Therapy  Tonny Branch 09/18/2021, 11:12 AM

## 2021-09-18 NOTE — Discharge Instructions (Addendum)
Inpatient Rehab Discharge Instructions  Kendra Clay Discharge date and time:  09/24/2021  Activities/Precautions/ Functional Status: Activity: no lifting, driving, or strenuous exercise until cleared by MD Diet: renal diet Wound Care: keep wound clean and dry Functional status:  ___ No restrictions     ___ Walk up steps independently ___ 24/7 supervision/assistance   ___ Walk up steps with assistance __x_ Intermittent supervision/assistance  ___ Bathe/dress independently ___ Walk with walker     _x__ Bathe/dress with assistance ___ Walk Independently    ___ Shower independently ___ Walk with assistance    __x_ Shower with assistance __x_ No alcohol     ___ Return to work/school ________   COMMUNITY REFERRALS UPON DISCHARGE:    Home Health:   PT     OT       SNA                Agency:[facility]  Phone:[phone] *Please expect follow-up within 2-3 days of discharge to schedule your home visit. If you have not received follow-up be sure to contact the branch directly.*   Medical Equipment/Items Ordered: rolling walker with left platform attachment and wheelchair                                                 Agency/Supplier: [facility] for transportation needs [phone]. You are scheduled for transportation on Friday (8/25)*   Special Instructions:  No driving, alcohol consumption or tobacco use.    My questions have been answered and I understand these instructions. I will adhere to these goals and the provided educational materials after my discharge from the hospital.  Patient/Caregiver Signature _______________________________ Date __________  Clinician Signature _______________________________________ Date __________  Please bring this form and your medication list with you to all your follow-up doctor's appointments.

## 2021-09-19 NOTE — Progress Notes (Signed)
[name] Progress Note   Subjective:   did HD overnight, feels rather tired from this and is not pleased with doing HD overnight. She reports that she cannot have more than 3L UF'ed. Otherwise no other complaints   Objective Vitals:   09/19/21 0437 09/19/21 0500 09/19/21 0510 09/19/21 0608  BP: 112/68 100/61  112/64  Pulse: (!) 101 99  97  Resp: 20 16  17   Temp: 98 F (36.7 C)   98.4 F (36.9 C)  TempSrc: Oral   Oral  SpO2: 97% 98%  98%  Weight:   65.8 kg   Height:       Physical Exam General: Alert female, nad  Heart: RRR, no murmurs, rubs or gallops Lungs: CTA bilaterally without wheezing,rhonchi or rales Abdomen: Soft, non-distended, +BS Extremities: Trace edema LLE, 1+ nonpitting edema RLE (brace in place) Neuro: awake, alert Dialysis Access: RUE AVF + thrill  Additional Objective Labs: Basic Metabolic Panel: Recent Labs  Lab 09/13/21 1351 09/16/21 1435 09/18/21 2007  NA 127* 122* 122*  K 4.1 4.0 3.6  CL 88* 87* 86*  CO2 27 26 26   GLUCOSE 110* 109* 90  BUN 50* 48* 39*  CREATININE 6.61* 8.37* 7.37*  CALCIUM 8.4* 7.9* 7.9*  PHOS 3.6 2.9 3.1   Liver Function Tests: Recent Labs  Lab 09/13/21 1351 09/16/21 1435 09/18/21 2007  ALBUMIN 2.2* 2.1* 1.9*   No results for input(s): "LIPASE", "AMYLASE" in the last 168 hours. CBC: Recent Labs  Lab 09/13/21 1351 09/14/21 0617 09/16/21 1435 09/18/21 2007  WBC 6.8  --  5.8 3.9*  HGB 7.2* 8.0* 7.2* 7.1*  HCT 22.3* 24.9* 22.1* 22.0*  MCV 87.5  --  88.8 88.4  PLT 140*  --  113* 107*   Blood Culture No results found for: "SDES", "SPECREQUEST", "CULT", "REPTSTATUS"  Cardiac Enzymes: No results for input(s): "CKTOTAL", "CKMB", "CKMBINDEX", "TROPONINI" in the last 168 hours. CBG: No results for input(s): "GLUCAP" in the last 168 hours. Iron Studies: No results for input(s): "IRON", "TIBC", "TRANSFERRIN", "FERRITIN" in the last 72 hours. @lablastinr3 @ Studies/Results: No results  found. Medications:   apixaban  2.5 mg Oral BID   atorvastatin  40 mg Oral QHS   Chlorhexidine Gluconate Cloth  6 each Topical Q0600   Chlorhexidine Gluconate Cloth  6 each Topical Q0600   darbepoetin (ARANESP) injection - DIALYSIS  150 mcg Intravenous Q Wed-HD   doxercalciferol  8 mcg Intravenous Q M,W,F-HD   feeding supplement  237 mL Oral BID BM   ferric citrate  210 mg Oral TID WC   guaiFENesin  200 mg Oral Q6H   multivitamin  1 tablet Oral QHS   pantoprazole  40 mg Oral BID   senna-docusate  2 tablet Oral QHS   Vitamin D (Ergocalciferol)  50,000 Units Oral Q7 days    Dialysis Orders: [facility] MWF  3h 80min  350/1.5   60.5kg  2/2 bath  P2  Hep 1200  RFA AVF - last Hb 9.5 on 7/26 - doxercalciferol 5mcg IV q HD - Mircera 48mcg IV q 4wks, last 7/24  Assessment/Plan: Fall w/ traumatic extremity Fx: S/p ORIF L distal femur Fx, ORIF L distal radius/ulnar Fx, IM nailing of R open tibial Fx on 08/29/21.  Per ortho. On Eliquis for DVT prophylaxis. Now in CIR 2. COVID: Hypoxic on admit. Finished course of molnupiravir + steroids. Off precautions. 3. ESRD: Continue HD on MWF schedule 4. Hyponatremia: Chronic, but worsening lately. May be volume related, continue UF with  HD as tolerated. 138Na bath w/ HD 5. HTN/volume: BP stable - prior OP meds on hold (losartan, isosorbide, coreg). Uses midodrine only prn with HD, orders adjusted. Having some orthostatic dizziness which correlates with onset of low hemoglobin. Does have some edema on exam (which is slightly better as compared to yesterday), reestablishing EDW.  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 Received Aranesp 150 on 8/9. S/p 2U PRBCs on 7/27 and 1 unit on 09/13/21 Reports dark stool, FOBT still pending, reordered. Transfuse prn 7. Secondary hyperparathyroidism: CorrCa/Phos controlled. Continue VDRA and binder.   8. Nutrition: Alb low, on supplements to help promote wound healing. 9. Dispo: CIR. EDD 8/22  Kendra Quint,  MD [facility]

## 2021-09-19 NOTE — Progress Notes (Signed)
Physical Therapy Session Note  Patient Details  Name: Kendra Clay MRN: [id number] Date of Birth: 03/14/1960  Today's Date: 09/19/2021 PT Individual Time: 1002-1059 and 1224-8250 PT Individual Time Calculation (min): 57 min and 72 min  Short Term Goals: Week 2:  PT Short Term Goal 1 (Week 2): STGs = LTGs  Skilled Therapeutic Interventions/Progress Updates:     Pt received supine in bed and agrees to therapy, but reports that she is exhausted because she got picked up for dialysis at 2am this morning. Reports pain in both legs. Number not provided. PT provides rest breaks and mobility to manage pain. Pt perform supine to sit from flat bed with use of bed rail and cues for positioning. Sit to stand from elevated bed with minA and cues for hand placement and sequencing. Stand step transfer to [facility] with cues for positioning. WC transport to gym for time management. Stand pivot transfer to Nustep without AD, requiring modA and cues for powering up through legs, as well as hand placement and sequencing. Pt completes Nustep for strength and endurance training, as well as increasing ROM in bilateral lower extremities. Pt completes total of 10:00 at workload of 5 with multiple rest breaks. PT cues to attempt to maintain steps per minute >30 and pt is able to briefly attain rate but typically keeps steps per minute ~20.  Pt performs stand pivot transfer back to [facility] with modA and facilitation of anterior weight shift, power up, weight shift laterally, and eccentric control of stand to sit. Pt then performs 3x10 LAQs with cues for optimal performance. WC transport back to room.  Squat pivot back to bed with modA. Sit to supine with modA management of bilateral lower extremities. Left supine with alarm intact and all needs within reach.  2nd Session: Pt received supine in bed and agrees to therapy. Reports pain 3.5/10 pain in R knee. PT provides rest breaks and mobility to manage pain. PT provides totalA to  don CAM boot and L shoe in supine. Pt performs supine to sit from flat bed with use of bedrail and cues for sequencing. PT has discussion with pt on possibility of using bedrail versus hospital bed upon discharge. Pt then performs sit to stand from elevated bed with minA and cues for hand placement and body mechanics. Stand step transfer to [facility] with CGA and cues for positioning.  Pt stands from [facility] with CGA and cues for hand placement, body mechanics, and anterior weight shift. Pt ambulates x30' with L platform RW and CGA, with multimodal cues for posture and upright gaze to improve balance. Pt ambulates additional bouts of 90' and 39' with extended seated rest breaks between each bout. Pt requires max encouragement when ambulating due to requesting to sit prior to limits of fatigue. WC transport back to room. Squat pivot back to bed with modA. Sit to supine with modA management of bilateral lower extremities. Left supine with alarm intact and all needs within reach.  Therapy Documentation Precautions:  Precautions Precautions: Fall, Other (comment) Required Braces or Orthoses: Other Brace Other Brace: CAM boot on RLE with transfers/ mobility/ ambulation Restrictions Weight Bearing Restrictions: Yes LUE Weight Bearing: Non weight bearing RLE Weight Bearing: Weight bearing as tolerated LLE Weight Bearing: Weight bearing as tolerated   Therapy/Group: Individual Therapy  Breck Coons, PT, DPT 09/19/2021, 5:23 PM

## 2021-09-19 NOTE — Progress Notes (Signed)
Nutrition Follow-up  DOCUMENTATION CODES:   Not applicable  INTERVENTION:   Renal Multivitamin w/ minerals daily Encourage good PO intake 30 ml ProSource Plus BID, each supplement provides 100 kcals and 15 grams protein.  Discontinue Ensure Enlive  Assist with meal setup   NUTRITION DIAGNOSIS:   Increased nutrient needs related to acute illness (fractures) as evidenced by estimated needs. - Ongoing  GOAL:   Patient will meet greater than or equal to 90% of their needs - Ongoing   MONITOR:   PO intake, Supplement acceptance, Labs, Weight trends, I & O's  REASON FOR ASSESSMENT:   Consult Assessment of nutrition requirement/status, Poor PO  ASSESSMENT:   61 y.o. female admitted to CIR after multiple fractures from a fall at home. Pt required surgical intervention to R tibia, L femur, and L radius/ulna. PMH includes ESRD on HD, COPD, HLD, and HTN.   Pt resting in bed at time of RD visit. Pt reports that her appetite is good, but does not like the food here. States that once being put on a regular diet, that was beneficial and helped her find more foods she likes. RD reviewed menu with pt and alternatives that are always available. Reports that she does not usually follow a renal diet at home but will monitor her fluid well. States that she takes her binders everyday.  Endorses some nausea. Pt shares that it is difficult for her to set up meal tray due to current L arm in splint; RD to order meal set up.  Meal completions: 25-100% - pt orders minimal food on trays  Discussed nutritional supplements with pt. Pt reports that she does not like the Ensure shakes and would rather to have ProSource back. RD to change order.   Medications reviewed and include: Hectorol, Ferric citrate, Rena-vit, Protonix, Senokot-S, Vitamin D Labs reviewed: Sodium 122  HD on 8/17 EDW - 60.5 kg Net UF: 2700 mL  Diet Order:   Diet Order             Diet regular Room service appropriate? Yes;  Fluid consistency: Thin; Fluid restriction: 1200 mL Fluid  Diet effective now                   EDUCATION NEEDS:   No education needs have been identified at this time  Skin:  Skin Assessment: Reviewed RN Assessment  Last BM:  8/15  Height:   Ht Readings from Last 1 Encounters:  09/09/21 5\' 3"  (1.6 m)    Weight:   Wt Readings from Last 1 Encounters:  09/19/21 65.8 kg    Ideal Body Weight:  52.3 kg  BMI:  Body mass index is 25.7 kg/m.  Estimated Nutritional Needs:   Kcal:  1700-1900  Protein:  85-100  Fluid:  UOP + 1L    Hermina Barters RD, LDN Clinical Dietitian See Shea Evans for contact information.

## 2021-09-19 NOTE — Progress Notes (Signed)
Received patient in bed to unit.  Alert and oriented.  Informed consent signed and in  chart.   Treatment initiated: 0109 Treatment completed: 9692  Patient tolerated well.  Transported back to the room  alert, without acute distress.  Hand-off given to patient's nurse.   Access used: fistual Access issues: none  Total UF removed: 2700 Medication(s) given: zofran, Midodrine, Hectorol, aranesp Post HD VS: 98-temp, 112/68 (78),HR- 101, RR_20, SP02-99  Post HD weight: 65.8kg  BP kept dropping even after giving PRN midodrine and 200cc NS bolus. PT states normal amount pulled is 2500.   [facility]

## 2021-09-19 NOTE — Progress Notes (Signed)
Occupational Therapy Session Note  Patient Details  Name: Kendra Clay MRN: [id number] Date of Birth: 04/12/1960  Today's Date: 09/19/2021 OT Individual Time: 3953-2023 OT Individual Time Calculation (min): 58 min    Short Term Goals: Week 2:  OT Short Term Goal 1 (Week 2): LTG=STG 2/2 ELOS  Skilled Therapeutic Interventions/Progress Updates:    Pt greeted semi-reclined in bed. Pt reported she was very fatigued because they did not get her for dialysis until 2 AM and she returned to her room at 6 Am. She was still agreeable to try to participate. OT educated on use of reacher to thread pant legs in bed. She still needed min A to get them over her feet, but demonstrated understanding of technique. Rolling to pull up pants with min A. She then reported need to urinate. Min A for bed mobility using hand rails. OT assist to don R CAM boot and BSC brought out. Pt needed bed raised and 3 trials to get to standing with mod A. Pt then pivot to BSC using PFRW and min A. Min A for balance while pt managed clothing. Pt voided bladder and was able to perform peri-care with set-up A. Min A to stand again but needed OT assist to pull pants back up despite enouragement to try. Pt pivoted back to bed with min A and PFRW. Min A for UB bathing/dressing at EOB. Mod A to lift LE's back in bed. Pt left semi-reclined in bed with nursing present.   Therapy Documentation Precautions:  Precautions Precautions: Fall, Other (comment) Required Braces or Orthoses: Other Brace Other Brace: CAM boot on RLE with transfers/ mobility/ ambulation Restrictions Weight Bearing Restrictions: Yes LUE Weight Bearing: Non weight bearing RLE Weight Bearing: Weight bearing as tolerated LLE Weight Bearing: Weight bearing as tolerated Pain:  Pain in BLE's no number given, rest and repositioned   Therapy/Group: Individual Therapy  Valma Cava 09/19/2021, 9:23 AM

## 2021-09-19 NOTE — Progress Notes (Signed)
PROGRESS NOTE   Subjective/Complaints:  Fatigued and unhappy about overnight HD. Pain is controlled. Doesn't know how much she's going to be able to do today  ROS: Patient denies fever, rash, sore throat, blurred vision, dizziness, nausea, vomiting, diarrhea, cough, shortness of breath or chest pain,   headache, or mood change.      Objective:   No results found. Recent Labs    09/16/21 1435 09/18/21 2007  WBC 5.8 3.9*  HGB 7.2* 7.1*  HCT 22.1* 22.0*  PLT 113* 107*   Recent Labs    09/16/21 1435 09/18/21 2007  NA 122* 122*  K 4.0 3.6  CL 87* 86*  CO2 26 26  GLUCOSE 109* 90  BUN 48* 39*  CREATININE 8.37* 7.37*  CALCIUM 7.9* 7.9*    Intake/Output Summary (Last 24 hours) at 09/19/2021 1058 Last data filed at 09/19/2021 0433 Gross per 24 hour  Intake 472 ml  Output 2700 ml  Net -2228 ml        Physical Exam: Vital Signs Blood pressure 112/64, pulse 97, temperature 98.4 F (36.9 C), temperature source Oral, resp. rate 17, height 5\' 3"  (1.6 m), weight 65.8 kg, SpO2 98 %.  Constitutional: No distress . Vital signs reviewed. Appears fatigued HEENT: NCAT, EOMI, oral membranes moist Neck: supple Cardiovascular: RRR without murmur. No JVD    Respiratory/Chest: CTA Bilaterally without wheezes or rales. Normal effort    GI/Abdomen: BS +, non-tender, non-distended Ext: no clubbing, cyanosis, or edema Psych: pleasant and cooperative, a little flat  Skin: No evidence of breakdown, no evidence of rash Bruising bilateral legs is improving.   Neuro:  Alert and oriented x 3. Normal insight and awareness. Intact Memory. Follows commands. Normal language and speech. Cranial nerve exam unremarkable. Motor limited by pain in BLE and LUE but appears intact Musculoskeletal: multifocal swelling along bilateral LE and LUE. Left wrist swelling decreased. Splint is snug and in appropriate alignment. Left wrist sl tender with  palpation.    Assessment/Plan: 1. Functional deficits which require 3+ hours per day of interdisciplinary therapy in a comprehensive inpatient rehab setting. Physiatrist is providing close team supervision and 24 hour management of active medical problems listed below. Physiatrist and rehab team continue to assess barriers to discharge/monitor patient progress toward functional and medical goals  Care Tool:  Bathing    Body parts bathed by patient: Face, Chest, Abdomen   Body parts bathed by helper: Right arm, Buttocks, Right upper leg, Right lower leg, Left upper leg, Left lower leg     Bathing assist Assist Level: Supervision/Verbal cueing     Upper Body Dressing/Undressing Upper body dressing   What is the patient wearing?: Pull over shirt    Upper body assist Assist Level: Minimal Assistance - Patient > 75%    Lower Body Dressing/Undressing Lower body dressing      What is the patient wearing?: Pants, Incontinence brief     Lower body assist Assist for lower body dressing: Moderate Assistance - Patient 50 - 74%     Toileting Toileting Toileting Activity did not occur (Clothing management and hygiene only): N/A (no void or bm)  Toileting assist Assist for toileting: Dependent -  Patient 0%     Transfers Chair/bed transfer  Transfers assist  Chair/bed transfer activity did not occur: Safety/medical concerns  Chair/bed transfer assist level: Moderate Assistance - Patient 50 - 74% Chair/bed transfer assistive device: Walker, Orthosis (platform walker)   Locomotion Ambulation   Ambulation assist   Ambulation activity did not occur: Safety/medical concerns  Assist level: Contact Guard/Touching assist Assistive device: Walker-platform (CAM boot) Max distance: 4'   Walk 10 feet activity   Assist  Walk 10 feet activity did not occur: Safety/medical concerns        Walk 50 feet activity   Assist Walk 50 feet with 2 turns activity did not occur:  Safety/medical concerns         Walk 150 feet activity   Assist Walk 150 feet activity did not occur: Safety/medical concerns         Walk 10 feet on uneven surface  activity   Assist Walk 10 feet on uneven surfaces activity did not occur: Safety/medical concerns         Wheelchair     Assist Is the patient using a wheelchair?: Yes (Per PT long term goals) Type of Wheelchair: Manual Wheelchair activity did not occur: Safety/medical concerns         Wheelchair 50 feet with 2 turns activity    Assist    Wheelchair 50 feet with 2 turns activity did not occur: Safety/medical concerns       Wheelchair 150 feet activity     Assist  Wheelchair 150 feet activity did not occur: Safety/medical concerns       Blood pressure 112/64, pulse 97, temperature 98.4 F (36.9 C), temperature source Oral, resp. rate 17, height 5\' 3"  (1.6 m), weight 65.8 kg, SpO2 98 %.  Medical Problem List and Plan: 1. Functional deficits secondary to debility secondary to multiple fractures after fall underwent open reduction internal fixation of left distal femur fracture and ORIF of left distal radius fracture             -patient may not shower             -ELOS/Goals:  PT/OT mod I            -Continue CIR therapies including PT, OT  2.  Antithrombotics: -DVT/anticoagulation:  Pharmaceutical: Eliquis             -antiplatelet therapy: none 3. Pain Management: Tylenol, oxycodone as needed 4. Mood/Behavior/Sleep: LCSW to evaluate and provide emotional support             -antipsychotic agents: n/a 5. Neuropsych/cognition: This patient is capable of making decisions on her own behalf. 6. Skin/Wound Care: Routine skin care checks             -monitor surgical incisions..sutures out soon 7. Fluids/Electrolytes/Nutrition: Strict Is and Os; nephrology following             -daily weights             -renal diet/1200 cc fluid restriction ongoing 8: Left femur fracture s/p ORIF:  WBAT 9:Left distal radius and ulnar fracture s/p ORIF             -Nonweightbearing through the wrist, continue splint  -there is mild tenderness but splint is appropriately fitting.   -encouraged her to avoid using wrist.  10: Right open tibial fracture s/p IM nail:WBAT             -wearing CAM boot with therapy/mobilization 11: ESRD on HD:  M/W/F schedule per nephrology, she has a RUE AVF  -HD after therapies to allow for therapy participation 12: CAD s/p BMS to LAD in 2017: Stable, denies chest pain             -follows with Dr. Otho Perl (last seen 05/2021) 13: Covid pneumonia: treated and off precautions, denies SOB             -continue FV; Mucinex  -sx  improved  - Scheduled Mucinex helpful 14: Vitamin D deficiency: continue supplementation 15: Chronic heart failure (EF 40% in 2017): stable (normal left ventricular systolic function)            Filed Weights   09/18/21 0500 09/19/21 0108 09/19/21 0510  Weight: 67 kg 68.5 kg 65.8 kg    Weights up an down with HD 16: Emphysema: FV/IS 17: Pancytopenia,   18: Anemia: CKD/ABL              -continue Aranesp, Auryxia per nephrology             -HGB down to 7.2 again 8/14  -still waiting on OB 8/17  -blood transfusion as needed with HD 19: GERD: continue Protonix 20: Hyperlipidemia: continue Lipitor 21: Labile BP: hydralazine 10 mg q 6 hours prn sys BP > 170             -midodrine 5 mg as needed only at the start of dialysis   -observe for next session, bp elevated at baseline today  -bp and HR controlled 8/17 22. Constipation  -+ results with sorbitol 8/14  -continue senna-s 23. Nausea  -prn zofran, reglan. Some of this is functional 24. Decreased PO intake  -Dietician consult  -Continue prosource plus  -reviewing importance of keeping bowels moving also   LOS: 10 days A FACE TO FACE EVALUATION WAS PERFORMED  Meredith Staggers 09/19/2021, 10:58 AM

## 2021-09-20 LAB — RENAL FUNCTION PANEL
Albumin: 2.3 g/dL — ABNORMAL LOW (ref 3.5–5.0)
Anion gap: 11 (ref 5–15)
BUN: 14 mg/dL (ref 6–20)
CO2: 24 mmol/L (ref 22–32)
Calcium: 8.3 mg/dL — ABNORMAL LOW (ref 8.9–10.3)
Chloride: 94 mmol/L — ABNORMAL LOW (ref 98–111)
Creatinine, Ser: 3.2 mg/dL — ABNORMAL HIGH (ref 0.44–1.00)
GFR, Estimated: 16 mL/min — ABNORMAL LOW (ref >60)
Glucose, Bld: 79 mg/dL (ref 70–99)
Phosphorus: 2.4 mg/dL — ABNORMAL LOW (ref 2.5–4.6)
Potassium: 4 mmol/L (ref 3.5–5.1)
Sodium: 129 mmol/L — ABNORMAL LOW (ref 135–145)

## 2021-09-20 LAB — CBC
HCT: 22.9 % — ABNORMAL LOW (ref 36.0–46.0)
Hemoglobin: 7.3 g/dL — ABNORMAL LOW (ref 12.0–15.0)
MCH: 29 pg (ref 26.0–34.0)
MCHC: 31.9 g/dL (ref 30.0–36.0)
MCV: 90.9 fL (ref 80.0–100.0)
Platelets: 123 10*3/uL — ABNORMAL LOW (ref 150–400)
RBC: 2.52 MIL/uL — ABNORMAL LOW (ref 3.87–5.11)
RDW: 18.2 % — ABNORMAL HIGH (ref 11.5–15.5)
WBC: 3.4 10*3/uL — ABNORMAL LOW (ref 4.0–10.5)
nRBC: 0 % (ref 0.0–0.2)

## 2021-09-20 MED ORDER — LIDOCAINE 5 % EX PTCH: 1.0000 | MEDICATED_PATCH | Freq: Every day | CUTANEOUS | Status: DC | PRN

## 2021-09-20 NOTE — Progress Notes (Addendum)
[facility] Progress Note   Subjective:   pt seen and examined in room. No complaints. No acute events. Does not want to do HD again in the middle of the night, she's okay with waiting until the next day after therapy--No objections from my end.   Objective Vitals:   09/19/21 1256 09/19/21 1924 09/20/21 0438 09/20/21 0500  BP: 128/63 126/68 131/69   Pulse: 89 93 88   Resp: 14 16 18    Temp: 97.9 F (36.6 C) 97.8 F (36.6 C) 97.9 F (36.6 C)   TempSrc: Oral Oral Oral   SpO2: 95% 97% 96%   Weight:    65 kg  Height:       Physical Exam General: Alert female, nad  Heart: RRR, no murmurs, rubs or gallops Lungs: CTA bilaterally without wheezing,rhonchi or rales Abdomen: Soft, non-distended, +BS Extremities: Trace edema LLE, trace nonpitting edema RLE (brace in place) Neuro: awake, alert Dialysis Access: RUE AVF + thrill  Additional Objective Labs: Basic Metabolic Panel: Recent Labs  Lab 09/13/21 1351 09/16/21 1435 09/18/21 2007  NA 127* 122* 122*  K 4.1 4.0 3.6  CL 88* 87* 86*  CO2 27 26 26   GLUCOSE 110* 109* 90  BUN 50* 48* 39*  CREATININE 6.61* 8.37* 7.37*  CALCIUM 8.4* 7.9* 7.9*  PHOS 3.6 2.9 3.1   Liver Function Tests: Recent Labs  Lab 09/13/21 1351 09/16/21 1435 09/18/21 2007  ALBUMIN 2.2* 2.1* 1.9*   No results for input(s): "LIPASE", "AMYLASE" in the last 168 hours. CBC: Recent Labs  Lab 09/13/21 1351 09/14/21 0617 09/16/21 1435 09/18/21 2007  WBC 6.8  --  5.8 3.9*  HGB 7.2* 8.0* 7.2* 7.1*  HCT 22.3* 24.9* 22.1* 22.0*  MCV 87.5  --  88.8 88.4  PLT 140*  --  113* 107*   Blood Culture No results found for: "SDES", "SPECREQUEST", "CULT", "REPTSTATUS"  Cardiac Enzymes: No results for input(s): "CKTOTAL", "CKMB", "CKMBINDEX", "TROPONINI" in the last 168 hours. CBG: No results for input(s): "GLUCAP" in the last 168 hours. Iron Studies: No results for input(s): "IRON", "TIBC", "TRANSFERRIN", "FERRITIN" in the last 72  hours. @lablastinr3 @ Studies/Results: No results found. Medications:   apixaban  2.5 mg Oral BID   atorvastatin  40 mg Oral QHS   Chlorhexidine Gluconate Cloth  6 each Topical Q0600   Chlorhexidine Gluconate Cloth  6 each Topical Q0600   darbepoetin (ARANESP) injection - DIALYSIS  150 mcg Intravenous Q Wed-HD   doxercalciferol  8 mcg Intravenous Q M,W,F-HD   feeding supplement  237 mL Oral BID BM   ferric citrate  210 mg Oral TID WC   guaiFENesin  200 mg Oral Q6H   multivitamin  1 tablet Oral QHS   pantoprazole  40 mg Oral BID   senna-docusate  2 tablet Oral QHS   Vitamin D (Ergocalciferol)  50,000 Units Oral Q7 days    Dialysis Orders: Bath MWF  3h 4min  350/1.5   60.5kg  2/2 bath  P2  Hep 1200  RFA AVF - last Hb 9.5 on 7/26 - doxercalciferol 49mcg IV q HD - Mircera 1mcg IV q 4wks, last 7/24  Assessment/Plan: Fall w/ traumatic extremity Fx: S/p ORIF L distal femur Fx, ORIF L distal radius/ulnar Fx, IM nailing of R open tibial Fx on 08/29/21.  Per ortho. On Eliquis for DVT prophylaxis. Now in CIR 2. COVID: Hypoxic on admit. Finished course of molnupiravir + steroids. Off precautions. 3. ESRD: Continue HD on MWF schedule 4. Hyponatremia: Chronic,  but worsening lately. May be volume related, continue UF with HD as tolerated. 138Na bath w/ HD. Monitor closely 5. HTN/volume: BP stable - prior OP meds on hold (losartan, isosorbide, coreg). Uses midodrine only prn with HD, orders adjusted. Having some orthostatic dizziness which correlates with onset of low hemoglobin. Does have some trace edema on exam (which is stable as compared to yesterday), reestablishing EDW.  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 Received Aranesp 150 on 8/9. S/p 2U PRBCs on 7/27 and 1 unit on 09/13/21 Reported dark stool, FOBT still pending, reordered but still not done. Transfuse prn 7. Secondary hyperparathyroidism: CorrCa/Phos controlled. Continue VDRA and binder.   8. Nutrition: Alb low, on  supplements to help promote wound healing. 9. Dispo: CIR. EDD 8/22  Gean Quint, MD [facility]

## 2021-09-20 NOTE — Progress Notes (Signed)
Received patient in bed to unit.  Alert and oriented.  Informed consent signed and in  chart.   Treatment initiated: 1551 Treatment completed: 1904  Patient tolerated well.  Transported back to the room  alert, without acute distress.  Hand-off given to patient's nurse.   Access used: fistula Access issues: none  Total UF removed: 3000 Medication(s) given: hectorol Post HD VS: 97.9, 103/62 (74), HR-95, RR-19,SP02-92 Post HD weight: 64.8kg   [facility]

## 2021-09-20 NOTE — Progress Notes (Signed)
Occupational Therapy Session Note  Patient Details  Name: Kendra Clay MRN: [id number] Date of Birth: 10/11/1960  Today's Date: 09/20/2021 Session 1 OT Individual Time: 4665-9935 OT Individual Time Calculation (min): 70 min   Session 2 OT Individual Time: 1100-1200 OT Individual Time Calculation (min): 60 min    Short Term Goals: Week 2:  OT Short Term Goal 1 (Week 2): LTG=STG 2/2 ELOS  Skilled Therapeutic Interventions/Progress Updates:    Session 1 Pt greeted semi-reclined in bed and agreeable to OT treatment session. Completed bed mobility today with HOB elevated, bed rail, and supervision. OT assist to don R CAM boot, then mod A to stand with PFRW. She then ambulated to the sink with CGA. While standing at the sink, pt able to reach and collect grooming items and turn on water. UB bathing/dressing from wc at the sink with Min A to wash under R arm, but set-up A to don shirt. Sit<>stand with PFRW and min A from wc, then pt able to remove R UE support to wash peri-area and buttocks. OT reviewed use of reacher to thread underwear and pants, which she was able to do today with encouragement and moderate cues for technique. OT assist to pull up pants. Functional ambulation in room 10 feet w/ CGA and PFRW. Pt reported some mild dizziness in standing. BP 102/64, HR 99, SpO2 99. Per discussion with Dr. Tressa Busman, pt likely dehydrated from dialysis. Pt left seated in wc with call bell in reach and needs met.  Pain: Pain Assessment Pain Score: Pt reported pain managed   Session 2 Pt greeted seated in wc still after previous PT session and wanted to wash her hair. Pt brought to the sink in wc and wc hair washing tray used. Pt was able to hold tray in place with L hand while OT assisted with washing hair. Pt then tolerated blow drying her hair with one hand while taking intermittent rest breaks. Worked on sit<>stands withPFRW and functional ambulation. Pt needed min A and increased time to power  up, then CGA for ambulation. Addressed functional balance while removing R UE support on PFRW to assist with doffting pants in standing prior to return to bed. OT introduced use of gait belt as a leg lifter which helped pt position L LE in bed, but still needed  min A to get it all the way up onto the bed. Pt left semi-reclined in bed with bed alarm on, call bell in reach, and needs met.   Therapy Documentation Precautions:  Precautions Precautions: Fall, Other (comment) Required Braces or Orthoses: Other Brace Other Brace: CAM boot on RLE with transfers/ mobility/ ambulation Restrictions Weight Bearing Restrictions: Yes LUE Weight Bearing: Non weight bearing RLE Weight Bearing: Weight bearing as tolerated LLE Weight Bearing: Weight bearing as tolerated Pain: Pain Assessment Pain Score: Pt reported pain managed    Therapy/Group: Individual Therapy  Valma Cava 09/20/2021, 1:00 PM

## 2021-09-20 NOTE — Progress Notes (Addendum)
PROGRESS NOTE   Subjective/Complaints: Patient seen and examined at bedside this AM.  Working with OT, endorses feelings of dizziness and noted systolic blood pressure 185 with ambulation to the sink.  Symptoms improve upon sitting, resting.  Patient denies any prior symptoms of orthostasis, nursing does state that she had approximately 4 L of fluid removed with dialysis yesterday.  Patient complains of pain and mild swelling on top of her right foot.  Notes that this began overnight.  She is in a surgical boot during the day and PRAFO's at night, does note some rubbing on the top of the foot.  No increased edema from prior, erythema, or warmth.   ROS:   + Dizziness  Patient denies fever, nausea, vomiting, diarrhea, cough, shortness of breath or chest pain,   headache, or mood change.      Objective:   No results found. Recent Labs    09/18/21 2007  WBC 3.9*  HGB 7.1*  HCT 22.0*  PLT 107*   Recent Labs    09/18/21 2007  NA 122*  K 3.6  CL 86*  CO2 26  GLUCOSE 90  BUN 39*  CREATININE 7.37*  CALCIUM 7.9*    Intake/Output Summary (Last 24 hours) at 09/20/2021 0906 Last data filed at 09/20/2021 0728 Gross per 24 hour  Intake 360 ml  Output --  Net 360 ml        Physical Exam: Vital Signs Blood pressure 131/69, pulse 88, temperature 97.9 F (36.6 C), temperature source Oral, resp. rate 18, height 5\' 3"  (1.6 m), weight 65 kg, SpO2 96 %.  Constitutional: No distress . Vital signs reviewed. Appears well, seated.  HEENT: NCAT, EOMI, oral membranes moist Neck: supple Cardiovascular: RRR without murmur. No JVD.  Respiratory/Chest: CTA Bilaterally without wheezes or rales. Normal effort. On RA.   GI/Abdomen: BS +, non-tender, non-distended Ext: 2+ pitting edema RLE; stable per pt. Psych: pleasant and cooperative  Skin: Mild bruising over medial dorsum of right foot.  Neuro:  Alert and oriented x 3. No apparent  deficits.  Musculoskeletal: Left wrist wrapped, Splint is snug and in appropriate alignment.   RLE in removable CAM boot. mild pain with palpation of dorsal foot; no pain on calf palpation.   Assessment/Plan: 1. Functional deficits which require 3+ hours per day of interdisciplinary therapy in a comprehensive inpatient rehab setting. Physiatrist is providing close team supervision and 24 hour management of active medical problems listed below. Physiatrist and rehab team continue to assess barriers to discharge/monitor patient progress toward functional and medical goals  Care Tool:  Bathing    Body parts bathed by patient: Face, Chest, Abdomen   Body parts bathed by helper: Right arm, Buttocks, Right upper leg, Right lower leg, Left upper leg, Left lower leg     Bathing assist Assist Level: Supervision/Verbal cueing     Upper Body Dressing/Undressing Upper body dressing   What is the patient wearing?: Pull over shirt    Upper body assist Assist Level: Minimal Assistance - Patient > 75%    Lower Body Dressing/Undressing Lower body dressing      What is the patient wearing?: Pants, Incontinence brief  Lower body assist Assist for lower body dressing: Moderate Assistance - Patient 50 - 74%     Toileting Toileting Toileting Activity did not occur Landscape architect and hygiene only): N/A (no void or bm)  Toileting assist Assist for toileting: Dependent - Patient 0%     Transfers Chair/bed transfer  Transfers assist  Chair/bed transfer activity did not occur: Safety/medical concerns  Chair/bed transfer assist level: Moderate Assistance - Patient 50 - 74% Chair/bed transfer assistive device: Walker, Orthosis (platform walker)   Locomotion Ambulation   Ambulation assist   Ambulation activity did not occur: Safety/medical concerns  Assist level: Contact Guard/Touching assist Assistive device: Walker-platform (CAM boot) Max distance: 4'   Walk 10 feet  activity   Assist  Walk 10 feet activity did not occur: Safety/medical concerns        Walk 50 feet activity   Assist Walk 50 feet with 2 turns activity did not occur: Safety/medical concerns         Walk 150 feet activity   Assist Walk 150 feet activity did not occur: Safety/medical concerns         Walk 10 feet on uneven surface  activity   Assist Walk 10 feet on uneven surfaces activity did not occur: Safety/medical concerns         Wheelchair     Assist Is the patient using a wheelchair?: Yes (Per PT long term goals) Type of Wheelchair: Manual Wheelchair activity did not occur: Safety/medical concerns         Wheelchair 50 feet with 2 turns activity    Assist    Wheelchair 50 feet with 2 turns activity did not occur: Safety/medical concerns       Wheelchair 150 feet activity     Assist  Wheelchair 150 feet activity did not occur: Safety/medical concerns       Blood pressure 131/69, pulse 88, temperature 97.9 F (36.6 C), temperature source Oral, resp. rate 18, height 5\' 3"  (1.6 m), weight 65 kg, SpO2 96 %.  Medical Problem List and Plan: 1. Functional deficits secondary to debility secondary to multiple fractures after fall underwent open reduction internal fixation of left distal femur fracture and ORIF of left distal radius fracture             -patient may not shower             -ELOS/Goals:  PT/OT mod I            -Continue CIR therapies including PT, OT  2.  Antithrombotics: -DVT/anticoagulation:  Pharmaceutical: Eliquis             -antiplatelet therapy: none 3. Pain Management: Tylenol, oxycodone as needed - using approx BID - TID 4. Mood/Behavior/Sleep: LCSW to evaluate and provide emotional support             -antipsychotic agents: n/a 5. Neuropsych/cognition: This patient is capable of making decisions on her own behalf. 6. Skin/Wound Care: Routine skin care checks            - R dorsal foot bruise 8/18 -  lidocaine patch PRN for pain, can use gauze as barrier to offload pressure of boot/PRAFOs             -monitor surgical incisions..sutures out soon - will check with post-surgical instructions today.  7. Fluids/Electrolytes/Nutrition: Strict Is and Os; nephrology following             -daily weights             -  renal diet/1200 cc fluid restriction ongoing 8: Left femur fracture s/p ORIF: WBAT 9:Left distal radius and ulnar fracture s/p ORIF             -Nonweightbearing through the wrist, continue splint  -there is mild tenderness but splint is appropriately fitting.   -encouraged her to avoid using wrist.  10: Right open tibial fracture s/p IM nail:WBAT             -wearing CAM boot with therapy/mobilization 11: ESRD on HD: M/W/F schedule per nephrology, she has a RUE AVF  -HD after therapies to allow for therapy participation 12: CAD s/p BMS to LAD in 2017: Stable, denies chest pain             -follows with Dr. Otho Perl (last seen 05/2021) 13: Covid pneumonia: treated and off precautions, denies SOB             -continue FV; Mucinex - improved  -sx  improved   14: Vitamin D deficiency: continue supplementation 15: Chronic heart failure (EF 40% in 2017): stable (normal left ventricular systolic function)            Filed Weights   09/19/21 0108 09/19/21 0510 09/20/21 0500  Weight: 68.5 kg 65.8 kg 65 kg    Weights up an down with HD 16: Emphysema: FV/IS 17: Pancytopenia,   18: Anemia: CKD/ABL              -continue Aranesp, Auryxia per nephrology             -HGB stable 7.1 8/16 - may be contributing to s/s orthostasis, fatigue given Hx CHF, however would not liberalize transfusion parameters at this time given HD  -blood transfusion as needed with HD 19: GERD: continue Protonix 20: Hyperlipidemia: continue Lipitor 21: Labile BP, Orthostasis: hydralazine 10 mg q 6 hours prn sys BP > 170             -midodrine 5 mg as needed only at the start of dialysis - used last session  - 8/18  symptoms of orthostais, PT to check formal orthostatic vitals today             - Encourage fluid intakes today  22. Constipation  -+ results with sorbitol 8/14  -continue senna-s             - LBM recorded 8/15 - Large, liquid, black; FOBT pending  23. Nausea  -prn zofran, reglan. - using             - Component of  functional 24. Decreased PO intake  -Dietician consult completed 8/17  -Continue prosource plus  -reviewing importance of keeping bowels moving also   LOS: 11 days A FACE TO FACE EVALUATION WAS PERFORMED  Gertie Gowda 09/20/2021, 9:06 AM   ** addendum** Orthostatic vitals as follows:  Orthostatic vitals Supine:120/62(78) Sitting:  111/66(77)   Standing: 125/57(77) HR=112 Pt only able to maintain standing x 1 min  Return to sitting: 104/66 (77) HR=96  Assessment is not clearly indicative of orthostasis but may be contributing along with component of general deconditioning. Continue with above plan of encourage PO fluids. HgB stable, no need for urgent FOBT.    Gertie Gowda 09/20/21, 14:25 pm

## 2021-09-20 NOTE — Progress Notes (Signed)
Physical Therapy Session Note  Patient Details  Name: MARITSSA HAUGHTON MRN: [id number] Date of Birth: 07/05/1960  Today's Date: 09/20/2021 PT Individual Time: 0900-1000 PT Individual Time Calculation (min): 60 min   Short Term Goals: Week 1:  PT Short Term Goal 1 (Week 1): Pt will perform bed mobility with consistent supervision. PT Short Term Goal 1 - Progress (Week 1): Progressing toward goal PT Short Term Goal 2 (Week 1): Pt will perform sit<>stand with Mod A +1 PT Short Term Goal 2 - Progress (Week 1): Met PT Short Term Goal 3 (Week 1): Pt will initiate gait training. PT Short Term Goal 3 - Progress (Week 1): Met PT Short Term Goal 4 (Week 1): Pt will perform seat to seat transfers with Mod A +1. PT Short Term Goal 4 - Progress (Week 1): Met Week 2:  PT Short Term Goal 1 (Week 2): STGs = LTGs  Skilled Therapeutic Interventions/Progress Updates:    Pt recd in W/c and agreeable, no pain at rest. Pt did report dizziness during last session and continued dizziness/nausea during session. Pt describes onset of symptoms with turning her head to grab her phone, consistent with vestibular symptoms, but also symptoms during OT session after activity. Pt transported to therapy gym for time management and energy conservation. D/t previous symptoms, pt participated in orthostatic vitals. Pt required mod A for Sit to stand, Stand pivot transfer, and supine>sit for BLE placement. Nsg provided nausea medication during session.   Orthostatic vitals Supine:120/62(78) Sitting:  111/66(77)   Pt reports nausea immediately upon sitting that resolved within 2 min "I don't feel good but I don't feel like I'm gonna toss my cookies Standing: 125/57(77) HR=112 Pt only able to maintain standing x 1 min so pressure taken immediately upon standing. Pt reports dizziness prior to attempting to stand and nausea symptoms in standing  x 1 min.  Return to sitting: 104/66 (77) HR=96    Pt utilized kinetron at w/c level  3 x1-1.5 min at 45 cm/sec for BLE strength, endurance and ROM. Education on the importance of maintaining joint ROM and strength for improved recovery. Pt returned to room after session and remained sitting in w/c, was left with all needs in reach and alarm active.    Therapy Documentation Precautions:  Precautions Precautions: Fall, Other (comment) Required Braces or Orthoses: Other Brace Other Brace: CAM boot on RLE with transfers/ mobility/ ambulation Restrictions Weight Bearing Restrictions: Yes LUE Weight Bearing: Non weight bearing RLE Weight Bearing: Weight bearing as tolerated LLE Weight Bearing: Weight bearing as tolerated General:       Therapy/Group: Individual Therapy  Mickel Fuchs 09/20/2021, 9:22 AM

## 2021-09-20 NOTE — Progress Notes (Addendum)
Patient ID: Kendra Clay, female   DOB: 1960/09/24, 61 y.o.   MRN: [id number]  HHPT/OT/aide referral accepted by [facility].  SW left message for Rebecca/[facility] [phone]) to follow-up about referral and see if her dialysis seat time has been approved.  SW waiting on follow-up.   *SW received updates from [facility] reporting that pt dialysis SW Sh[facility]onne at [facility] location [phone] ext. [phone])and transportation coordinator have been talking, and pt dialysis time for transportation has been secured; and still need forms. SW shared challenges with pt getting forms back  to facility. SW shared pt will need ot be on schedule for Wednesday (8/23). States she will relay. Will follow-up if there is an issue . SW left message for Lockie Mola to confirm above and waiting on follow-up.   Loralee Pacas, MSW, [place] Office: [phone] Cell: [phone] Fax: [phone]

## 2021-09-21 MED ORDER — SORBITOL 70 % SOLN
30.0000 mL | Status: DC
Start: 2021-09-21 — End: 2021-09-22
  Filled 2021-09-21: qty 30

## 2021-09-21 NOTE — Progress Notes (Signed)
Occupational Therapy Session Note  Patient Details  Name: Kendra Clay MRN: [id number] Date of Birth: 11-09-1960  Today's Date: 09/21/2021 OT Individual Time: 1345-1425 OT Individual Time Calculation (min): 40 min    Short Term Goals: Week 1:  OT Short Term Goal 1 (Week 1): Patient will complete toilet transfer with mod A of 1 OT Short Term Goal 1 - Progress (Week 1): Met OT Short Term Goal 2 (Week 1): Patient will complete 1 step of LB dressing task OT Short Term Goal 2 - Progress (Week 1): Met OT Short Term Goal 3 (Week 1): Pt will tolerate standing at the sink with min A in preparation for BADL task OT Short Term Goal 3 - Progress (Week 1): Progressing toward goal  Skilled Therapeutic Interventions/Progress Updates:     Pt received in bed with no pain reported!  ADL: Pt completes ADL at overall supervision Level. Skilled interventions include: min A and cuing for hand placement during squat pivot transfers, cuing for using BLE to walking Les to propel w/c to/from the sink, set up for oral care/washing face + upper body and set up for UB dressing at sink. Pt reporting already changed clothing for LB dressing this morning. CGA for functional transfers with the [place] and pt is able to doff pants with CGA in standing advancing pants past hips.   Pt left at end of session in bed with exit alarm on, call light in reach and all needs met   Therapy Documentation Precautions:  Precautions Precautions: Fall, Other (comment) Required Braces or Orthoses: Other Brace Other Brace: CAM boot on RLE with transfers/ mobility/ ambulation Restrictions Weight Bearing Restrictions: Yes LUE Weight Bearing: Non weight bearing RLE Weight Bearing: Weight bearing as tolerated LLE Weight Bearing: Weight bearing as tolerated General:    Therapy/Group: Individual Therapy  Tonny Branch 09/21/2021, 6:49 AM

## 2021-09-21 NOTE — Progress Notes (Signed)
PROGRESS NOTE   Subjective/Complaints: Reported some dizziness yesterday with associated tachycardia. Orthostatic vs were unremarkable. Feels much better today. Had a good night sleep.  ROS: Patient denies fever, rash, sore throat, blurred vision,   nausea, vomiting, diarrhea, cough, shortness of breath or chest pain, joint or back/neck pain, headache, or mood change.     Objective:   No results found. Recent Labs    09/18/21 2007 09/20/21 2205  WBC 3.9* 3.4*  HGB 7.1* 7.3*  HCT 22.0* 22.9*  PLT 107* 123*   Recent Labs    09/18/21 2007 09/20/21 2057  NA 122* 129*  K 3.6 4.0  CL 86* 94*  CO2 26 24  GLUCOSE 90 79  BUN 39* 14  CREATININE 7.37* 3.20*  CALCIUM 7.9* 8.3*    Intake/Output Summary (Last 24 hours) at 09/21/2021 0911 Last data filed at 09/21/2021 0600 Gross per 24 hour  Intake 120 ml  Output 3000 ml  Net -2880 ml        Physical Exam: Vital Signs Blood pressure (!) 153/74, pulse (!) 58, temperature 98.3 F (36.8 C), temperature source Oral, resp. rate 18, height 5\' 3"  (1.6 m), weight 64.5 kg, SpO2 97 %.  Constitutional: No distress . Vital signs reviewed. HEENT: NCAT, EOMI, oral membranes moist Neck: supple Cardiovascular: RRR without murmur. No JVD    Respiratory/Chest: CTA Bilaterally without wheezes or rales. Normal effort    GI/Abdomen: BS +, non-tender, non-distended Ext: no clubbing, cyanosis, ongoing RLE edema. Left hand swelling improved Psych: pleasant and cooperative  Skin: Mild bruising over medial dorsum of right foot still present  Neuro:  Alert and oriented x 3. No apparent deficits.  Musculoskeletal: Left wrist wrapped, Splint is snug and in appropriate alignment.      Assessment/Plan: 1. Functional deficits which require 3+ hours per day of interdisciplinary therapy in a comprehensive inpatient rehab setting. Physiatrist is providing close team supervision and 24 hour  management of active medical problems listed below. Physiatrist and rehab team continue to assess barriers to discharge/monitor patient progress toward functional and medical goals  Care Tool:  Bathing    Body parts bathed by patient: Face, Chest, Abdomen   Body parts bathed by helper: Right arm, Buttocks, Right upper leg, Right lower leg, Left upper leg, Left lower leg     Bathing assist Assist Level: Supervision/Verbal cueing     Upper Body Dressing/Undressing Upper body dressing   What is the patient wearing?: Pull over shirt    Upper body assist Assist Level: Minimal Assistance - Patient > 75%    Lower Body Dressing/Undressing Lower body dressing      What is the patient wearing?: Pants, Incontinence brief     Lower body assist Assist for lower body dressing: Moderate Assistance - Patient 50 - 74%     Toileting Toileting Toileting Activity did not occur Landscape architect and hygiene only): N/A (no void or bm)  Toileting assist Assist for toileting: Dependent - Patient 0%     Transfers Chair/bed transfer  Transfers assist  Chair/bed transfer activity did not occur: Safety/medical concerns  Chair/bed transfer assist level: Moderate Assistance - Patient 50 - 74% Chair/bed transfer assistive device:  Walker, Orthosis (platform walker)   Locomotion Ambulation   Ambulation assist   Ambulation activity did not occur: Safety/medical concerns  Assist level: Contact Guard/Touching assist Assistive device: Walker-platform (CAM boot) Max distance: 4'   Walk 10 feet activity   Assist  Walk 10 feet activity did not occur: Safety/medical concerns        Walk 50 feet activity   Assist Walk 50 feet with 2 turns activity did not occur: Safety/medical concerns         Walk 150 feet activity   Assist Walk 150 feet activity did not occur: Safety/medical concerns         Walk 10 feet on uneven surface  activity   Assist Walk 10 feet on uneven  surfaces activity did not occur: Safety/medical concerns         Wheelchair     Assist Is the patient using a wheelchair?: Yes (Per PT long term goals) Type of Wheelchair: Manual Wheelchair activity did not occur: Safety/medical concerns         Wheelchair 50 feet with 2 turns activity    Assist    Wheelchair 50 feet with 2 turns activity did not occur: Safety/medical concerns       Wheelchair 150 feet activity     Assist  Wheelchair 150 feet activity did not occur: Safety/medical concerns       Blood pressure (!) 153/74, pulse (!) 58, temperature 98.3 F (36.8 C), temperature source Oral, resp. rate 18, height 5\' 3"  (1.6 m), weight 64.5 kg, SpO2 97 %.  Medical Problem List and Plan: 1. Functional deficits secondary to debility secondary to multiple fractures after fall underwent open reduction internal fixation of left distal femur fracture and ORIF of left distal radius fracture             -patient may not shower             -ELOS/Goals:  PT/OT mod I            -Continue CIR therapies including PT, OT   2.  Antithrombotics: -DVT/anticoagulation:  Pharmaceutical: Eliquis             -antiplatelet therapy: none 3. Pain Management: Tylenol, oxycodone as needed - using approx BID - TID 4. Mood/Behavior/Sleep: LCSW to evaluate and provide emotional support             -antipsychotic agents: n/a 5. Neuropsych/cognition: This patient is capable of making decisions on her own behalf. 6. Skin/Wound Care: Routine skin care checks            - R dorsal foot bruise 8/18 - lidocaine patch PRN for pain, can use gauze as barrier to offload pressure of boot/PRAFOs--continue             -monitor surgical incisions..sutures out soon   7. Fluids/Electrolytes/Nutrition: Strict Is and Os; nephrology following             -daily weights             -renal diet/1200 cc fluid restriction ongoing 8: Left femur fracture s/p ORIF: WBAT 9:Left distal radius and ulnar fracture  s/p ORIF             -Nonweightbearing through the wrist, continue splint  -there is mild tenderness but splint is appropriately fitting.   -encouraged her to avoid using wrist.  10: Right open tibial fracture s/p IM nail:WBAT             -wearing CAM  boot with therapy/mobilization 11: ESRD on HD: M/W/F schedule per nephrology, she has a RUE AVF  -HD after therapies to allow for therapy participation 12: CAD s/p BMS to LAD in 2017: Stable, denies chest pain             -follows with Dr. Otho Perl (last seen 05/2021) 13: Covid pneumonia: treated and off precautions, denies SOB             -continue FV; Mucinex - improved  -sx  improved   14: Vitamin D deficiency: continue supplementation 15: Chronic heart failure (EF 40% in 2017): stable (normal left ventricular systolic function)            Filed Weights   09/20/21 0500 09/20/21 1955 09/21/21 0500  Weight: 65 kg 64.8 kg 64.5 kg    Weights up an down with HD but generally stable 16: Emphysema: FV/IS 17: Pancytopenia,   18: Anemia: CKD/ABL              -continue Aranesp, Auryxia per nephrology             -HGB stable 7.1 8/16 - may be contributing to s/s orthostasis, fatigue given Hx CHF, however would not liberalize transfusion parameters at this time given HD  -blood transfusion as needed with HD  -still need stool sample for OB 19: GERD: continue Protonix 20: Hyperlipidemia: continue Lipitor 21: Labile BP, Orthostasis: hydralazine 10 mg q 6 hours prn sys BP > 170             -midodrine 5 mg as needed only at the start of dialysis - used last session  - 8/19 intermittent dizziness. Likely driven by volume loss in HD.  -discussed making sure she's drinking adequate fluids -continue to take rest breaks as needed/ acclimation  22. Constipation  -+ results with sorbitol 8/14  -continue senna-s             - LBM recorded 8/15 - Large, liquid, black   -8/19 repeat sorbitol today  23. Nausea  -prn zofran, reglan. - using             -  Component of  functional 24. Decreased PO intake  -Dietician consult completed 8/17  -Continue prosource plus  -reviewing importance of keeping bowels moving also   LOS: 12 days A FACE TO FACE EVALUATION WAS PERFORMED  Meredith Staggers 09/21/2021, 9:11 AM       Gertie Gowda 09/20/21, 14:25 pm

## 2021-09-21 NOTE — Progress Notes (Signed)
Physical Therapy Session Note  Patient Details  Name: Kendra Clay MRN: [id number] Date of Birth: 06-19-1960  Today's Date: 09/21/2021 PT Individual Time: 0932-1015 PT Individual Time Calculation (min): 43 min   Short Term Goals: Week 1:  PT Short Term Goal 1 (Week 1): Pt will perform bed mobility with consistent supervision. PT Short Term Goal 1 - Progress (Week 1): Progressing toward goal PT Short Term Goal 2 (Week 1): Pt will perform sit<>stand with Mod A +1 PT Short Term Goal 2 - Progress (Week 1): Met PT Short Term Goal 3 (Week 1): Pt will initiate gait training. PT Short Term Goal 3 - Progress (Week 1): Met PT Short Term Goal 4 (Week 1): Pt will perform seat to seat transfers with Mod A +1. PT Short Term Goal 4 - Progress (Week 1): Met  Skilled Therapeutic Interventions/Progress Updates:  Pt awake in bed on arrival. Agreeable to PT at this time.   BED MOBILITY: Assist to don pants up to knees supine in bed. Pt able to lift LE's to assist.  Supervsion supine>EOB with rail/HOB 30 with increased time/effort needed  TRANSFERS: Min assist to stand from elevated bed to [place] with cues on technique.  Static standing with PFRW with supervison for balance and total assist to pull pants all the way up.  6-7 feet with PFRW bed>wheelchair with CGA.   STRENGTHENING:  Usilng bil LE's to propel wheelchair forward down hall for 50 feet with supervision. Left LE: 1#  with LAQ's no weight with marching x 15 reps each Right UE: with CAM boot on LAQ and marching x 15 reps each  Pt transported back to room via wheelchair. Pt left in wheelchair with needs in reach and belt alarm on.   Therapy Documentation Precautions:  Precautions Precautions: Fall, Other (comment) Required Braces or Orthoses: Other Brace Other Brace: CAM boot on RLE with transfers/ mobility/ ambulation Restrictions Weight Bearing Restrictions: Yes LUE Weight Bearing: Non weight bearing RLE Weight Bearing: Weight  bearing as tolerated LLE Weight Bearing: Weight bearing as tolerated General:   Vital Signs: Therapy Vitals Temp: 98.6 F (37 C) Pulse Rate: 93 Resp: 19 BP: (!) 108/54 Patient Position (if appropriate): Lying Oxygen Therapy SpO2: 98 % O2 Device: Room Air Pain: reports left wrist pain at 4-5/10. Asking for pain med's at end of session. RN aware/notified.     Therapy/Group: Individual Therapy  Willow Ora, PTA, [place] 09/21/21, 4:20 PM

## 2021-09-22 MED ORDER — SORBITOL 70 % SOLN
30.0000 mL | Freq: Once | Status: AC
  Administered 2021-09-22: 30 mL via ORAL
  Filled 2021-09-22: qty 30

## 2021-09-22 MED ORDER — CHLORHEXIDINE GLUCONATE CLOTH 2 % EX PADS: 6.0000 | MEDICATED_PAD | Freq: Every day | CUTANEOUS | Status: DC

## 2021-09-22 NOTE — Progress Notes (Signed)
Sutures removed LLE per order. Patient tolerated well. Large loose stool noted this afternoon. Denies pain or discomfort at this time. Call light within reach.

## 2021-09-22 NOTE — Progress Notes (Signed)
Physical Therapy Session Note  Patient Details  Name: Kendra Clay MRN: [id number] Date of Birth: 07/19/1960  Today's Date: 09/22/2021 PT Individual Time: 0815-0845 PT Individual Time Calculation (min): 30 min   Short Term Goals: Week 2:  PT Short Term Goal 1 (Week 2): STGs = LTGs  Skilled Therapeutic Interventions/Progress Updates:     PT 15 min late to session due to scheduling conflict. Patient in bed with RN in the room upon PT arrival. Patient alert and agreeable to PT session. Patient reported 7/10 B lower extremity pain during session, RN made aware and provided medication during session. PT provided repositioning, rest breaks, and distraction as pain interventions throughout session.   Therapeutic Activity: Bed Mobility: Patient donned pants with set-up and supervision assist with min cues for sequencing/technique in long sitting in the bed. She performed supine to/from sit with supervision with use of hospital bed features per POC for d/c home with hospital bed. Provided verbal cues for bringing legs off the bed first then scooting to EOB. Transfers: Patient performed sit to/from stand from EOB, unable to come to standing without elevating bed height +2 inches, performed transfer with CGA using L PFRW with bed height elevated. Educated on limits of home hospital bed and inability to elevate bed height and provided cues for increased forward weight shift and gluteal activation for boosting up. Patient performed an ambulatory toilet transfer to/from the bathroom with CGA-supervision using L PFRW and BSC over toilet. Patient was continent of bowel and bladder during toileting, performed peri-care with set-up assist and lower body clothing management with supervision and increased time. Provided verbal cues for safe management of AD, and forward weight shift in standing balance without upper extremity support while managing lower body clothing. Provided education on bowel regularity,  constipation with pain meds, and relaxation techniques to avoid straining during BM.  Patient in w/c in the room at end of session with breaks locked, seat belt alarm set, and all needs within reach.   Therapy Documentation Precautions:  Precautions Precautions: Fall, Other (comment) Required Braces or Orthoses: Other Brace Other Brace: CAM boot on RLE with transfers/ mobility/ ambulation Restrictions Weight Bearing Restrictions: Yes LUE Weight Bearing: Non weight bearing RLE Weight Bearing: Weight bearing as tolerated LLE Weight Bearing: Weight bearing as tolerated    Therapy/Group: Individual Therapy  Delfin Squillace L Quindarius Cabello PT, DPT, NCS, CBIS  09/22/2021, 7:16 PM

## 2021-09-22 NOTE — Progress Notes (Signed)
  [facility] Progress Note   Subjective:   pt seen in room. Supposed to be dc'd this coming Tuesday.   Objective Vitals:   09/21/21 1753 09/21/21 1946 09/22/21 0500 09/22/21 0507  BP: 113/61 110/61  136/69  Pulse: (!) 104 94  84  Resp: 19 16  16   Temp: 98.8 F (37.1 C) 98.5 F (36.9 C)  98 F (36.7 C)  TempSrc: Oral     SpO2: 100% 96%  100%  Weight:   68.5 kg   Height:       Physical Exam General: Alert female, nad  Heart: RRR, no murmurs, rubs or gallops Lungs: CTA bilaterally without wheezing,rhonchi or rales Abdomen: Soft, non-distended, +BS Extremities: 1-2+ bilat LE edema (brace/ dressings in place) Neuro: awake, alert Dialysis Access: RUE AVF + thrill  Dialysis Orders: [place] MWF  3h 17min  350/1.5   60.5kg  2/2 bath  P2  Hep 1200  RFA AVF - last Hb 9.5 on 7/26 - doxercalciferol 66mcg IV q HD - Mircera 23mcg IV q 4wks, last 7/24   Assessment/Plan: Fall w/ traumatic extremity Fx: S/p ORIF L distal femur Fx, ORIF L distal radius/ulnar Fx, IM nailing of R open tibial Fx on 08/29/21.  Per ortho. On Eliquis for DVT prophylaxis. Now in CIR 2. COVID: Hypoxic on admit. Finished course of molnupiravir + steroids. Off precautions. 3. ESRD: Continue HD on MWF schedule. HD Monday.  4. HTN/volume: BP stable - prior OP meds on hold (losartan, isosorbide, coreg). Uses midodrine only prn with HD, orders adjusted. Having some orthostatic dizziness which correlates with onset of low hemoglobin. Not sure wt's accurate w/ the heavy brace. Has LE edema maybe related to the fractures. Clear lungs, no resp issues, cont 2.5- 3 L UF, seems to be helping to keep Na+ up.  5. Anemia of ESRD: getting IV darbe 150 ug q wed. S/p 2U PRBCs on 7/27 and 1 unit on 09/13/21. Hb 7-8 range, cont to transfuse prn 6. Secondary hyperparathyroidism: CorrCa/Phos in range. Cont IV VDRA and po binder.   7. Nutrition: Alb low, on supplements to help promote wound healing. 8. Dispo - CIR. EDD  8/22  Kelly Splinter, MD 09/22/2021, 1:08 PM  Recent Labs  Lab 09/18/21 2007 09/20/21 2057 09/20/21 2205  HGB 7.1*  --  7.3*  ALBUMIN 1.9* 2.3*  --   CALCIUM 7.9* 8.3*  --   PHOS 3.1 2.4*  --   CREATININE 7.37* 3.20*  --   K 3.6 4.0  --     Inpatient medications:  apixaban  2.5 mg Oral BID   atorvastatin  40 mg Oral QHS   Chlorhexidine Gluconate Cloth  6 each Topical Q0600   Chlorhexidine Gluconate Cloth  6 each Topical Q0600   darbepoetin (ARANESP) injection - DIALYSIS  150 mcg Intravenous Q Wed-HD   doxercalciferol  8 mcg Intravenous Q M,W,F-HD   feeding supplement  237 mL Oral BID BM   ferric citrate  210 mg Oral TID WC   guaiFENesin  200 mg Oral Q6H   multivitamin  1 tablet Oral QHS   pantoprazole  40 mg Oral BID   senna-docusate  2 tablet Oral QHS   Vitamin D (Ergocalciferol)  50,000 Units Oral Q7 days    acetaminophen, bisacodyl, diphenhydrAMINE, guaiFENesin-dextromethorphan, hydrALAZINE, lidocaine, methocarbamol, metoCLOPramide **OR** metoCLOPramide (REGLAN) injection, midodrine, ondansetron **OR** ondansetron (ZOFRAN) IV, mouth rinse, oxyCODONE, polyethylene glycol, sodium phosphate, traZODone

## 2021-09-22 NOTE — Plan of Care (Signed)
Pt alert and oriented x 4. Pt received 1 dose of oxy and 1 dose of robaxin this shift. Vitals stable. CHG completed this am.  Problem: Education: Goal: Knowledge of General Education information will improve Description: Including pain rating scale, medication(s)/side effects and non-pharmacologic comfort measures Outcome: Progressing   Problem: Health Behavior/Discharge Planning: Goal: Ability to manage health-related needs will improve Outcome: Progressing   Problem: Clinical Measurements: Goal: Ability to maintain clinical measurements within normal limits will improve Outcome: Progressing Goal: Will remain free from infection Outcome: Progressing Goal: Diagnostic test results will improve Outcome: Progressing Goal: Respiratory complications will improve Outcome: Progressing Goal: Cardiovascular complication will be avoided Outcome: Progressing   Problem: Activity: Goal: Risk for activity intolerance will decrease Outcome: Progressing   Problem: Nutrition: Goal: Adequate nutrition will be maintained Outcome: Progressing   Problem: Coping: Goal: Level of anxiety will decrease Outcome: Progressing   Problem: Elimination: Goal: Will not experience complications related to bowel motility Outcome: Progressing Goal: Will not experience complications related to urinary retention Outcome: Progressing   Problem: Pain Managment: Goal: General experience of comfort will improve Outcome: Progressing   Problem: Safety: Goal: Ability to remain free from injury will improve Outcome: Progressing   Problem: Skin Integrity: Goal: Risk for impaired skin integrity will decrease Outcome: Progressing

## 2021-09-22 NOTE — Progress Notes (Signed)
PROGRESS NOTE   Subjective/Complaints: No new issues. Still didn't have bm but refused sorbitol.  ROS: Patient denies fever, rash, sore throat, blurred vision, dizziness, nausea, vomiting, diarrhea, cough, shortness of breath or chest pain, headache, or mood change.     Objective:   No results found. Recent Labs    09/20/21 2205  WBC 3.4*  HGB 7.3*  HCT 22.9*  PLT 123*   Recent Labs    09/20/21 2057  NA 129*  K 4.0  CL 94*  CO2 24  GLUCOSE 79  BUN 14  CREATININE 3.20*  CALCIUM 8.3*    Intake/Output Summary (Last 24 hours) at 09/22/2021 0847 Last data filed at 09/22/2021 0739 Gross per 24 hour  Intake 660 ml  Output --  Net 660 ml        Physical Exam: Vital Signs Blood pressure 136/69, pulse 84, temperature 98 F (36.7 C), resp. rate 16, height 5\' 3"  (1.6 m), weight 68.5 kg, SpO2 100 %.  Constitutional: No distress . Vital signs reviewed. HEENT: NCAT, EOMI, oral membranes moist Neck: supple Cardiovascular: RRR without murmur. No JVD    Respiratory/Chest: CTA Bilaterally without wheezes or rales. Normal effort    GI/Abdomen: BS +, non-tender, non-distended Ext: no clubbing, cyanosis. Distal edema RLE, left hand better Psych: pleasant and cooperative  Skin: Mild bruising over medial dorsum of right foot still present. Wounds cdi Neuro:  Alert and oriented x 3. No apparent deficits.  Musculoskeletal: Left wrist wrapped, Splint is snug and in appropriate alignment.      Assessment/Plan: 1. Functional deficits which require 3+ hours per day of interdisciplinary therapy in a comprehensive inpatient rehab setting. Physiatrist is providing close team supervision and 24 hour management of active medical problems listed below. Physiatrist and rehab team continue to assess barriers to discharge/monitor patient progress toward functional and medical goals  Care Tool:  Bathing    Body parts bathed by  patient: Face, Chest, Abdomen   Body parts bathed by helper: Right arm, Buttocks, Right upper leg, Right lower leg, Left upper leg, Left lower leg     Bathing assist Assist Level: Supervision/Verbal cueing     Upper Body Dressing/Undressing Upper body dressing   What is the patient wearing?: Pull over shirt    Upper body assist Assist Level: Minimal Assistance - Patient > 75%    Lower Body Dressing/Undressing Lower body dressing      What is the patient wearing?: Pants, Incontinence brief     Lower body assist Assist for lower body dressing: Moderate Assistance - Patient 50 - 74%     Toileting Toileting Toileting Activity did not occur Landscape architect and hygiene only): N/A (no void or bm)  Toileting assist Assist for toileting: Dependent - Patient 0%     Transfers Chair/bed transfer  Transfers assist  Chair/bed transfer activity did not occur: Safety/medical concerns  Chair/bed transfer assist level: Moderate Assistance - Patient 50 - 74% Chair/bed transfer assistive device: Walker, Orthosis (platform walker)   Locomotion Ambulation   Ambulation assist   Ambulation activity did not occur: Safety/medical concerns  Assist level: Contact Guard/Touching assist Assistive device: Walker-platform (CAM boot) Max distance: 4'  Walk 10 feet activity   Assist  Walk 10 feet activity did not occur: Safety/medical concerns        Walk 50 feet activity   Assist Walk 50 feet with 2 turns activity did not occur: Safety/medical concerns         Walk 150 feet activity   Assist Walk 150 feet activity did not occur: Safety/medical concerns         Walk 10 feet on uneven surface  activity   Assist Walk 10 feet on uneven surfaces activity did not occur: Safety/medical concerns         Wheelchair     Assist Is the patient using a wheelchair?: Yes (Per PT long term goals) Type of Wheelchair: Manual Wheelchair activity did not occur:  Safety/medical concerns         Wheelchair 50 feet with 2 turns activity    Assist    Wheelchair 50 feet with 2 turns activity did not occur: Safety/medical concerns       Wheelchair 150 feet activity     Assist  Wheelchair 150 feet activity did not occur: Safety/medical concerns       Blood pressure 136/69, pulse 84, temperature 98 F (36.7 C), resp. rate 16, height 5\' 3"  (1.6 m), weight 68.5 kg, SpO2 100 %.  Medical Problem List and Plan: 1. Functional deficits secondary to debility secondary to multiple fractures after fall underwent open reduction internal fixation of left distal femur fracture and ORIF of left distal radius fracture             -patient may not shower             -ELOS/Goals:  PT/OT mod I            -Continue CIR therapies including PT, OT  2.  Antithrombotics: -DVT/anticoagulation:  Pharmaceutical: Eliquis             -antiplatelet therapy: none 3. Pain Management: Tylenol, oxycodone as needed - using approx BID - TID 4. Mood/Behavior/Sleep: LCSW to evaluate and provide emotional support             -antipsychotic agents: n/a 5. Neuropsych/cognition: This patient is capable of making decisions on her own behalf. 6. Skin/Wound Care: Routine skin care checks            - R dorsal foot bruise 8/18 - lidocaine patch PRN for pain, can use gauze as barrier to offload pressure of boot/PRAFOs--continue             -monitor surgical incisions. 8/20 sutures out today. Leave sutures in wrist until follow up with ortho  7. Fluids/Electrolytes/Nutrition: Strict Is and Os; nephrology following             -daily weights             -renal diet/1200 cc fluid restriction ongoing 8: Left femur fracture s/p ORIF: WBAT 9:Left distal radius and ulnar fracture s/p ORIF             -Nonweightbearing through the wrist, continue splint  -there is mild tenderness but splint is appropriately fitting.   -encouraged her to avoid using wrist.  10: Right open tibial  fracture s/p IM nail:WBAT             -wearing CAM boot with therapy/mobilization 11: ESRD on HD: M/W/F schedule per nephrology, she has a RUE AVF  -HD after therapies to allow for therapy participation 12: CAD s/p BMS to LAD in 2017:  Stable, denies chest pain             -follows with Dr. Otho Perl (last seen 05/2021) 13: Covid pneumonia: treated and off precautions, denies SOB             -continue FV; Mucinex - improved  -sx  improved   14: Vitamin D deficiency: continue supplementation 15: Chronic heart failure (EF 40% in 2017): stable (normal left ventricular systolic function)            Filed Weights   09/20/21 1955 09/21/21 0500 09/22/21 0500  Weight: 64.8 kg 64.5 kg 68.5 kg    Weights up an down with HD but generally has been stable 16: Emphysema: FV/IS 17: Pancytopenia,   18: Anemia: CKD/ABL              -continue Aranesp, Auryxia per nephrology             -HGB stable 7.1 8/16 - may be contributing to s/s orthostasis, fatigue given Hx CHF, however would not liberalize transfusion parameters at this time given HD  -blood transfusion as needed with HD  -still no stool sample for OB (but hasn't been emptying bowels) 19: GERD: continue Protonix 20: Hyperlipidemia: continue Lipitor 21: Labile BP, Orthostasis: hydralazine 10 mg q 6 hours prn sys BP > 170             -midodrine 5 mg as needed only at the start of dialysis - used last session  - 8/19 intermittent dizziness. Likely driven by volume loss in HD.  -discussed making sure she's drinking adequate fluids -continue to take rest breaks as needed/ acclimation  22. Constipation  -+ results with sorbitol 8/14  -continue senna-s             - LBM recorded 8/15 - Large, liquid, black   -8/20 refused sorbitol yesterday--will see what she's willing to take.   -importance of bm has been reviewed with pt 23. Nausea  -prn zofran, reglan. - using             - Component of  functional 24. Decreased PO intake  -Dietician consult  completed 8/17  -Continue prosource plus  -reviewing importance of keeping bowels moving also   LOS: 13 days A FACE TO FACE EVALUATION WAS PERFORMED  Meredith Staggers 09/22/2021, 8:47 AM       Gertie Gowda 09/20/21, 14:25 pm

## 2021-09-23 LAB — RENAL FUNCTION PANEL
Albumin: 2.2 g/dL — ABNORMAL LOW (ref 3.5–5.0)
Anion gap: 11 (ref 5–15)
BUN: 33 mg/dL — ABNORMAL HIGH (ref 6–20)
CO2: 24 mmol/L (ref 22–32)
Calcium: 8.3 mg/dL — ABNORMAL LOW (ref 8.9–10.3)
Chloride: 85 mmol/L — ABNORMAL LOW (ref 98–111)
Creatinine, Ser: 7.39 mg/dL — ABNORMAL HIGH (ref 0.44–1.00)
GFR, Estimated: 6 mL/min — ABNORMAL LOW (ref >60)
Glucose, Bld: 103 mg/dL — ABNORMAL HIGH (ref 70–99)
Phosphorus: 2.5 mg/dL (ref 2.5–4.6)
Potassium: 3.7 mmol/L (ref 3.5–5.1)
Sodium: 120 mmol/L — ABNORMAL LOW (ref 135–145)

## 2021-09-23 LAB — CBC
HCT: 25.2 % — ABNORMAL LOW (ref 36.0–46.0)
Hemoglobin: 8 g/dL — ABNORMAL LOW (ref 12.0–15.0)
MCH: 29.1 pg (ref 26.0–34.0)
MCHC: 31.7 g/dL (ref 30.0–36.0)
MCV: 91.6 fL (ref 80.0–100.0)
Platelets: 145 10*3/uL — ABNORMAL LOW (ref 150–400)
RBC: 2.75 MIL/uL — ABNORMAL LOW (ref 3.87–5.11)
RDW: 19.2 % — ABNORMAL HIGH (ref 11.5–15.5)
WBC: 4.6 10*3/uL (ref 4.0–10.5)
nRBC: 0 % (ref 0.0–0.2)

## 2021-09-23 MED ORDER — LIDOCAINE 5 % EX PTCH
1.0000 | MEDICATED_PATCH | CUTANEOUS | Status: DC
  Filled 2021-09-23: qty 1

## 2021-09-23 MED ORDER — CHLORHEXIDINE GLUCONATE CLOTH 2 % EX PADS: 6.0000 | MEDICATED_PAD | Freq: Every day | CUTANEOUS | Status: DC

## 2021-09-23 MED ORDER — HEPARIN SODIUM (PORCINE) 1000 UNIT/ML DIALYSIS
1000.0000 [IU] | INTRAMUSCULAR | Status: DC | PRN
  Filled 2021-09-23: qty 1

## 2021-09-23 NOTE — Procedures (Signed)
Pt states she is going home tomorrow, but she can't get transport to HD to start until Friday. So she will miss Wed. I think we could do a short HD on her tomorrow am prior to dc and will d/w Rehab attending whether this is an option.   I was present at this dialysis session, have reviewed the session itself and made  appropriate changes Kelly Splinter MD [place] pager [phone]   09/23/2021, 3:27 PM

## 2021-09-23 NOTE — Progress Notes (Signed)
Inpatient Rehabilitation Discharge Medication Review by a Pharmacist  A complete drug regimen review was completed for this patient to identify any potential clinically significant medication issues.  High Risk Drug Classes Is patient taking? Indication by Medication  Antipsychotic No   Anticoagulant Yes Apixaban- s/p ortho (x 6 weeks)  Antibiotic No   Opioid Yes OxyIR- acute post-op pain  Antiplatelet No   Hypoglycemics/insulin No   Vasoactive Medication Yes NitroSL- angina  Chemotherapy No   Other Yes Lipitor- HLD Protonix- GERD Aranesp- anemia 2/2 ESRD Auryxia- phosphate binder 2/2 ESRD     Type of Medication Issue Identified Description of Issue Recommendation(s)  Drug Interaction(s) (clinically significant)     Duplicate Therapy     Allergy     No Medication Administration End Date     Incorrect Dose     Additional Drug Therapy Needed     Significant med changes from prior encounter (inform family/care partners about these prior to discharge).    Other  PTA medications: Losartan Imdur Consider resuming medications upon discharge if appropriate    Clinically significant medication issues were identified that warrant physician communication and completion of prescribed/recommended actions by midnight of the next day:  No   Time spent performing this drug regimen review (minutes):  30   Liah Morr BS, PharmD, BCPS Clinical Pharmacist 09/23/2021 10:49 AM  Contact: [phone] after 3 PM  "Be curious, not judgmental..." -Jamal Maes

## 2021-09-23 NOTE — Progress Notes (Signed)
Occupational Therapy Discharge Summary  Patient Details  Name: Kendra Clay MRN: [id number] Date of Birth: May 10, 1960  Date of Discharge from [facility] 21, 2023  Pt greeted seated in wc and agreeable to OT treatment session. Pt stood from wc with close supervision using [place]. Close supervision to then ambulate to the sink. Grooming tasks completed with set-up A and use of L hand as a stabilizer. Addressed wc mobility using L LE and R hand to propel wc 15 feet in hallway with increased time and rest breaks. Worked on standing balance/endurance using BITs system. Pt tolerated standing for 2 minutes w/ supervision, then 3.5 minutes on second set. OT educated on dc plan, safety within BADL tasks, home modifications, DME needs, and functional transfers. L hand exercises with review of exercises to complete at home. Pt returned to room and left seated in wc with needs met. See below for further detail regarding BADL performance.  Today's Date: 09/23/2021 OT Individual Time: 0950-1100 OT Individual Time Calculation (min): 70 min    Patient has met 12 of 12 long term goals due to improved activity tolerance, improved balance, postural control, and ability to compensate for deficits.  Patient to discharge at overall Supervision/min A level.  Patient's care partner is independent to provide the necessary physical assistance at discharge for donning CAM boot and higher level iADL tasks.    Reasons goals not met: n/a  Recommendation:  Patient will benefit from ongoing skilled OT services in home health setting to continue to advance functional skills in the area of BADL.  Equipment: L platform for RW, wc with elevating leg rests, hospital bed  Reasons for discharge: treatment goals met and discharge from hospital  Patient/family agrees with progress made and goals achieved: Yes  OT Discharge Precautions/Restrictions  Precautions Precautions: Fall Required Braces or Orthoses: Other  Brace Other Brace: CAM boot on RLE with transfers/ mobility/ ambulation Restrictions Weight Bearing Restrictions: Yes LUE Weight Bearing: Non weight bearing (WB through elbow) RLE Weight Bearing: Weight bearing as tolerated LLE Weight Bearing: Weight bearing as tolerated Pain Pain Assessment Pain Scale: 0-10 Pain Score: 5  Pain Type: Acute pain Pain Location: Wrist Pain Orientation: Left Pain Descriptors / Indicators: Aching Pain Frequency: Intermittent Pain Onset: With Activity Pain Intervention(s): Repositioned Multiple Pain Sites: No ADL ADL Eating: Set up Grooming: Modified independent Where Assessed-Grooming: Sitting at sink Upper Body Bathing: Supervision/safety Lower Body Bathing: Supervision/safety Upper Body Dressing: Supervision/safety Lower Body Dressing: Minimal assistance (CAM) Toileting: Supervision/safety Toilet Transfer: Close supervision Toilet Transfer Equipment: Radiographer, therapeutic: Moderate assistance Vision Baseline Vision/History: 1 Wears glasses Patient Visual Report: No change from baseline Vision Assessment?: No apparent visual deficits Perception  Perception: Within Functional Limits Praxis Praxis: Intact Cognition Cognition Overall Cognitive Status: Within Functional Limits for tasks assessed Arousal/Alertness: Awake/alert Orientation Level: Person;Place;Situation Person: Oriented Place: Oriented Situation: Oriented Memory: Appears intact Attention: Focused Focused Attention: Appears intact (affected by pain/ nausea) Awareness: Appears intact Problem Solving: Appears intact Safety/Judgment: Appears intact Brief Interview for Mental Status (BIMS) Repetition of Three Words (First Attempt): 3 Temporal Orientation: Year: Correct Temporal Orientation: Month: Accurate within 5 days Temporal Orientation: Day: Correct Recall: "Sock": Yes, no cue required Recall: "Blue": Yes, no cue required Recall: "Bed": Yes, no cue  required BIMS Summary Score: 15 Sensation Sensation Light Touch: Appears Intact Coordination Gross Motor Movements are Fluid and Coordinated: No Fine Motor Movements are Fluid and Coordinated: No Coordination and Movement Description: L hand swelling and ROM improved Motor  Motor  Motor: Within Functional Limits Mobility  Bed Mobility Bed Mobility: Rolling Right;Sit to Supine;Supine to Sit Supine to Sit: Supervision/Verbal cueing Sit to Supine: Supervision/Verbal cueing Transfers Sit to Stand: Supervision/Verbal cueing Stand to Sit: Supervision/Verbal cueing  Trunk/Postural Assessment  Cervical Assessment Cervical Assessment: Exceptions to [facility] (forward head) Thoracic Assessment Thoracic Assessment: Exceptions to [facility] (rounded shoulders with L protracted >R) Lumbar Assessment Lumbar Assessment: Exceptions to [facility] (posterior pelvic tilt) Postural Control Postural Control: Within Functional Limits  Balance Balance Balance Assessed: Yes Static Sitting Balance Static Sitting - Balance Support: Feet supported Static Sitting - Level of Assistance: 6: Modified independent (Device/Increase time) Dynamic Sitting Balance Dynamic Sitting - Balance Support: Feet supported Dynamic Sitting - Level of Assistance: 6: Modified independent (Device/Increase time) Static Standing Balance Static Standing - Balance Support: Right upper extremity supported Static Standing - Level of Assistance: 5: Stand by assistance Dynamic Standing Balance Dynamic Standing - Balance Support: Left upper extremity supported Dynamic Standing - Level of Assistance: 5: Stand by assistance Extremity/Trunk Assessment RUE Assessment RUE Assessment: Within Functional Limits LUE Assessment LUE Assessment: Exceptions to [facility] General Strength Comments: Cast to wrist to forearm. Elbow LUE Body System: Lanell Matar Vere Diantonio 09/23/2021, 12:59 PM

## 2021-09-23 NOTE — Progress Notes (Signed)
PROGRESS NOTE   Subjective/Complaints: Pt seen and examined at bedside. No concerns regarding discharge tomorrow, will discuss DC meds this afternoon. Does endorse soreness on her R medial ankle, new, without acute injury.  Did have some nausea overnight, requiring PRN zofran. Has not recurred. Continues to get somewhat nauseas and dizzy with standing but this substantially improved with decreased volume in last dialysis session.    + BM yesterday per report, loose, pt states was adequate.   ROS: Patient denies fever, vomiting, cough, shortness of breath or chest pain, headache, or mood change.     Objective:   No results found. Recent Labs    09/20/21 2205  WBC 3.4*  HGB 7.3*  HCT 22.9*  PLT 123*    Recent Labs    09/20/21 2057  NA 129*  K 4.0  CL 94*  CO2 24  GLUCOSE 79  BUN 14  CREATININE 3.20*  CALCIUM 8.3*     Intake/Output Summary (Last 24 hours) at 09/23/2021 5329 Last data filed at 09/23/2021 0819 Gross per 24 hour  Intake 540 ml  Output 0 ml  Net 540 ml         Physical Exam: Vital Signs Blood pressure 133/69, pulse 86, temperature 98.1 F (36.7 C), temperature source Oral, resp. rate 15, height 5\' 3"  (1.6 m), weight 64.5 kg, SpO2 97 %.  Constitutional: No distress . Vital signs reviewed. HEENT: NCAT, EOMI, oral membranes moist Neck: supple Cardiovascular: RRR without murmur. No JVD    Respiratory/Chest: CTA Bilaterally without wheezes or rales. Normal effort    GI/Abdomen: BS +, non-tender, non-distended Ext: no clubbing, cyanosis. Distal BL LE, RLE improved. Mild tenderness to palpation on R medial ankle, without warmth, erythema, or edema. 2+ distal pedal pulses bilaterally, capillary refill brisk. No calf tenderness.  Psych: pleasant and cooperative  Skin: Mild bruising over medial dorsum of right foot resolved. L wrist dressing c/d/I.  Neuro:  Alert and oriented x 3. No apparent  deficits.  Musculoskeletal: Left wrist splint and R CAM boot, Splint is snug and in appropriate alignment. Grip 5/5 bilaterally. Wiggles R toes full ROM in boot. L foot 5/5 DF, PF.      Assessment/Plan: 1. Functional deficits which require 3+ hours per day of interdisciplinary therapy in a comprehensive inpatient rehab setting. Physiatrist is providing close team supervision and 24 hour management of active medical problems listed below. Physiatrist and rehab team continue to assess barriers to discharge/monitor patient progress toward functional and medical goals  Care Tool:  Bathing    Body parts bathed by patient: Face, Chest, Abdomen   Body parts bathed by helper: Right arm, Buttocks, Right upper leg, Right lower leg, Left upper leg, Left lower leg     Bathing assist Assist Level: Supervision/Verbal cueing     Upper Body Dressing/Undressing Upper body dressing   What is the patient wearing?: Pull over shirt    Upper body assist Assist Level: Minimal Assistance - Patient > 75%    Lower Body Dressing/Undressing Lower body dressing      What is the patient wearing?: Pants, Incontinence brief     Lower body assist Assist for lower body dressing: Moderate  Assistance - Patient 50 - 74%     Toileting Toileting Toileting Activity did not occur Landscape architect and hygiene only): N/A (no void or bm)  Toileting assist Assist for toileting: Dependent - Patient 0%     Transfers Chair/bed transfer  Transfers assist  Chair/bed transfer activity did not occur: Safety/medical concerns  Chair/bed transfer assist level: Moderate Assistance - Patient 50 - 74% Chair/bed transfer assistive device: Walker, Orthosis (platform walker)   Locomotion Ambulation   Ambulation assist   Ambulation activity did not occur: Safety/medical concerns  Assist level: Contact Guard/Touching assist Assistive device: Walker-platform (CAM boot) Max distance: 4'   Walk 10 feet  activity   Assist  Walk 10 feet activity did not occur: Safety/medical concerns        Walk 50 feet activity   Assist Walk 50 feet with 2 turns activity did not occur: Safety/medical concerns         Walk 150 feet activity   Assist Walk 150 feet activity did not occur: Safety/medical concerns         Walk 10 feet on uneven surface  activity   Assist Walk 10 feet on uneven surfaces activity did not occur: Safety/medical concerns         Wheelchair     Assist Is the patient using a wheelchair?: Yes (Per PT long term goals) Type of Wheelchair: Manual Wheelchair activity did not occur: Safety/medical concerns         Wheelchair 50 feet with 2 turns activity    Assist    Wheelchair 50 feet with 2 turns activity did not occur: Safety/medical concerns       Wheelchair 150 feet activity     Assist  Wheelchair 150 feet activity did not occur: Safety/medical concerns       Blood pressure 133/69, pulse 86, temperature 98.1 F (36.7 C), temperature source Oral, resp. rate 15, height 5\' 3"  (1.6 m), weight 64.5 kg, SpO2 97 %.  Medical Problem List and Plan: 1. Functional deficits secondary to debility secondary to multiple fractures after fall underwent open reduction internal fixation of left distal femur fracture and ORIF of left distal radius fracture             -patient may not shower             -ELOS/Goals:  PT/OT mod I            -Continue CIR therapies including PT, OT  2.  Antithrombotics: -DVT/anticoagulation:  Pharmaceutical: Eliquis             -antiplatelet therapy: none 3. Pain Management: Tylenol, oxycodone as needed - using approx BID - TID 4. Mood/Behavior/Sleep: LCSW to evaluate and provide emotional support             -antipsychotic agents: n/a 5. Neuropsych/cognition: This patient is capable of making decisions on her own behalf. 6. Skin/Wound Care: Routine skin care checks            - R dorsal foot bruise 8/18 -  resolved            - R ankle pain - add scheduled lidocaine patch to R medial ankle today; likely mild irritation from Cam boot             -monitor surgical incisions. 8/20 sutures out. Leave sutures in wrist until follow up with ortho - patient needs to call ortho to schedule, will discuss as part of discharge planning.  7. Fluids/Electrolytes/Nutrition: Strict Is  and Os; nephrology following             -daily weights             -renal diet/1200 cc fluid restriction ongoing 8: Left femur fracture s/p ORIF: WBAT 9:Left distal radius and ulnar fracture s/p ORIF             -Nonweightbearing through the wrist, continue splint  -there is mild tenderness but splint is appropriately fitting.   -encouraged her to avoid using wrist.  10: Right open tibial fracture s/p IM nail:WBAT             -wearing CAM boot with therapy/mobilization 11: ESRD on HD: M/W/F schedule per nephrology, she has a RUE AVF  -HD after therapies to allow for therapy participation 12: CAD s/p BMS to LAD in 2017: Stable, denies chest pain             -follows with Dr. Otho Perl (last seen 05/2021) 13: Covid pneumonia: treated and off precautions, denies SOB             -continue FV; Mucinex - improved  -sx  improved   14: Vitamin D deficiency: continue supplementation 15: Chronic heart failure (EF 40% in 2017): stable (normal left ventricular systolic function)            Filed Weights   09/21/21 0500 09/22/21 0500 09/23/21 0555  Weight: 64.5 kg 68.5 kg 64.5 kg    Weights up an down with HD but generally has been stable 16: Emphysema: FV/IS 17: Pancytopenia,   18: Anemia: CKD/ABL              -continue Aranesp, Auryxia per nephrology             -HGB stable 7.1 8/16 - may be contributing to s/s orthostasis, fatigue given Hx CHF, however would not liberalize transfusion parameters at this time given HD  -blood transfusion as needed with HD  -still no stool sample for OB (but hasn't been emptying bowels) 19: GERD:  continue Protonix 20: Hyperlipidemia: continue Lipitor 21: Labile BP, Orthostasis: hydralazine 10 mg q 6 hours prn sys BP > 170             -midodrine 5 mg as needed only at the start of dialysis - used last session  - 8/19 intermittent dizziness. Likely driven by volume loss in HD. - improved -discussed making sure she's drinking adequate fluids - reinforced 8/21 -continue to take rest breaks as needed/ acclimation  22. Constipation  -+ results with sorbitol 8/14; 8/20 refused sorbitol    -continue senna-s             - LBM recorded 8/15 - > 8/20, large, loose  -importance of bm has been reviewed with pt 23. Nausea - ongoing, generally associated with orthostasis  -prn zofran, reglan. - using             - Component of  functional 24. Decreased PO intake - improved  -Dietician consult completed 8/17  -Continue prosource plus  -reviewing importance of keeping bowels moving also   LOS: 14 days A FACE TO [place] 09/23/2021, 9:37 AM

## 2021-09-23 NOTE — Progress Notes (Signed)
Notified by inpt HD unit staff that pt has orders for HD tomorrow. Pt is a MWF pt and receiving HD today. Pt is supposed to d/c from rehab tomorrow. Chart reviewed. Pt informed nephrologist of transportation issue to Wednesday treatment. Contacted inpt rehab CSW to inquire further. CSW is under the impression that pt should have transportation to HD on Wednesday and will confirm with agency tomorrow am. Inpt HD unit to place pt on 2nd shift with the hopes CSW can confirm transportation for Wed in the morning and pt will not require inpt HD tomorrow.   Melven Sartorius Renal Navigator [phone]

## 2021-09-23 NOTE — Progress Notes (Signed)
Physical Therapy Session Note  Patient Details  Name: AKIERA ALLBAUGH MRN: [id number] Date of Birth: 1960/02/21  Today's Date: 09/23/2021 PT Individual Time: 0800-0855 PT Individual Time Calculation (min): 55 min   Short Term Goals: Week 2:  PT Short Term Goal 1 (Week 2): STGs = LTGs  Skilled Therapeutic Interventions/Progress Updates:    pt received in bed and agreeable to therapy. Pt reports unrated pain with mobility, rest and positioning as needed. Pt doffed gown and donned shirt, threaded pants at bed level with supervision. Donned cam boot tot A. Supine>sit distant supervision with bed features and increased time. Pt stood to pull pants over hips with min A. Pt performed Sit to stand from elevated bed and w/c height with as little as CGA during session and as much as min A. ambulatory transfer to w/c with PFRW, CGA. Pt requires increased time for all tasks throughout session. Pt transported to therapy gym for time management and energy conservation. Pt performed car transfer at sedan height with mod A for BLE management. Pt owns sedan, but will likely be picked up in her friend's truck. Discussed options for mnaging a higher vehicle. Pt declines practicing d/t fatigue. Verbally discussed and therapist demonstrated how to effectively use a running board or step stool if available. Pt propelled w/c with BLE x ~80 ft, with max distance ~30 ft before requiring rest break. Pt was transported back to room and remained in w/c, was left with all needs in reach and alarm active.   Therapy Documentation Precautions:  Precautions Precautions: Fall, Other (comment) Required Braces or Orthoses: Other Brace Other Brace: CAM boot on RLE with transfers/ mobility/ ambulation Restrictions Weight Bearing Restrictions: Yes LUE Weight Bearing: Non weight bearing RLE Weight Bearing: Weight bearing as tolerated LLE Weight Bearing: Weight bearing as tolerated General:       Therapy/Group: Individual  Therapy  Mickel Fuchs 09/23/2021, 8:56 AM

## 2021-09-23 NOTE — Progress Notes (Signed)
Hd tx of 3.25hrs completed. 69.6L total vol processed. No complications. Report given to courtney tipper, rn Total uf removed: 2554ml Post hd wt: 68.3kgs Post hd v/s: 98.1 123/65(80) 90 16 99%

## 2021-09-23 NOTE — Progress Notes (Signed)
Physical Therapy Discharge Summary  Patient Details  Name: Kendra Clay MRN: [id number] Date of Birth: Jan 05, 1961  Date of Discharge from PT service:September 23, 2021  Today's Date: 09/23/2021 PT Individual Time: 1100-1200 PT Individual Time Calculation (min): 60 min    Patient has met 4 of 9 long term goals due to improved activity tolerance, improved balance, improved postural control, increased strength, and decreased pain.  Patient to discharge at  both a wheelchair and ambulatory  level Supervision.   Patient's care partner was unable to attend family education but pt verbalizes that her son is able to provide the necessary physical assistance at discharge.  Reasons goals not met: Pt often self limiting and so did not meet all goals, refusing to attempt stairs and without enough endurance to meet gait or WC goals. Pt is ambulating and propelling WC household distances, however, and she reports son can pull her up stairs in [facility] to enter house.  Recommendation:  Patient will benefit from ongoing skilled PT services in home health setting to continue to advance safe functional mobility, address ongoing impairments in strength, balance, transfers, ambulation, endurance, and minimize fall risk.  Equipment: 18" x 18" manual WC and Platform for RW  Reasons for discharge: discharge from hospital  Patient/family agrees with progress made and goals achieved: Yes  Skilled Therapeutic Interventions: Pt received seated in [facility] and agrees to therapy. Reports pain in both legs and RN present to provide pain medication. PT provides rest breaks as needed to manage pain.. WC transport to gym for time management. Pt performs multiple bouts of ambulation. Sit to stand with cues for hand placement and sequencing. Pt ambulates x40' and requests to have seated rest break. Pt requires modA to stand from chair without arms due to lack of leverage point for upper extremities. Pt ambulates 40' back to [place]. PT has  discussion with pt regarding need to provide maximal effort on each activity, as pt is frequently self limiting and requires maximal encouragement. Pt verbalizes understanding. Pt then ambulates x80' with close supervision and multimodal cues for posture to improve body mechanics, and increasing stride length to decrease risk for falls. Pt refuses to attempt stairs or ramp, saying that son will be able to pull her up stairs at home in her WC. Pt self propels WC x100' back toward room with bilateral lower extremities and R upper. PT provides cues for improved efficiency of propulsion and pt is minimally receptive. WC transport remainder of way back to room due to pt reporting pain in L wrist. Stand pviot back to bed with cues for sequencing. Pt requires minA for sit to supine. Left with alarm intact and all needs within reach.  PT Discharge Precautions/Restrictions Precautions Precautions: Fall Required Braces or Orthoses: Other Brace Other Brace: CAM boot on RLE with transfers/ mobility/ ambulation Restrictions Weight Bearing Restrictions: Yes LUE Weight Bearing: Non weight bearing (WB through elbow) RLE Weight Bearing: Weight bearing as tolerated LLE Weight Bearing: Weight bearing as tolerated Pain Pain Assessment Pain Scale: 0-10 Pain Score: 5  Pain Type: Acute pain Pain Location: Wrist Pain Orientation: Left Pain Descriptors / Indicators: Aching Pain Frequency: Intermittent Pain Onset: With Activity Pain Intervention(s): Medication (See eMAR) Multiple Pain Sites: No Pain Interference Pain Interference Pain Effect on Sleep: 2. Occasionally Pain Interference with Therapy Activities: 2. Occasionally Pain Interference with Day-to-Day Activities: 2. Occasionally Vision/Perception  Vision - History Ability to See in Adequate Light: 0 Adequate Perception Perception: Within Functional Limits Praxis Praxis: Intact  Cognition Overall Cognitive Status: Within Functional Limits for tasks  assessed Arousal/Alertness: Awake/alert Orientation Level: Oriented X4 Safety/Judgment: Appears intact Sensation Sensation Light Touch: Appears Intact Coordination Gross Motor Movements are Fluid and Coordinated: No Fine Motor Movements are Fluid and Coordinated: No Motor  Motor Motor: Within Functional Limits  Mobility Bed Mobility Bed Mobility: Rolling Right;Sit to Supine;Supine to Sit Supine to Sit: Supervision/Verbal cueing Sit to Supine: Supervision/Verbal cueing Transfers Transfers: Sit to Stand;Stand to Sit;Stand Pivot Transfers Sit to Stand: Supervision/Verbal cueing Stand to Sit: Supervision/Verbal cueing Stand Pivot Transfers: Supervision/Verbal cueing Stand Pivot Transfer Details: Verbal cues for gait pattern;Verbal cues for sequencing;Verbal cues for technique Transfer (Assistive device): Left platform walker Locomotion  Gait Ambulation: Yes Gait Assistance: Supervision/Verbal cueing Gait Distance (Feet): 80 Feet Assistive device: Rolling walker Gait Assistance Details: Verbal cues for gait pattern;Verbal cues for sequencing;Tactile cues for posture;Verbal cues for safe use of DME/AE Gait Gait: Yes Gait Pattern: Impaired Gait Pattern: Step-through pattern;Decreased stride length;Trunk flexed Gait velocity: Very slow Stairs / Additional Locomotion Stairs: No Wheelchair Mobility Wheelchair Mobility: Yes Wheelchair Assistance: Chartered loss adjuster: Both lower extermities;Right upper extremity Wheelchair Parts Management: Needs assistance Distance: 100'  Trunk/Postural Assessment  Cervical Assessment Cervical Assessment: Exceptions to [facility] (forward head) Thoracic Assessment Thoracic Assessment: Exceptions to [facility] (rounded shoulders) Lumbar Assessment Lumbar Assessment: Exceptions to [facility] (posterior pelvic tilt) Postural Control Postural Control: Within Functional Limits  Balance Balance Balance Assessed: Yes Static Sitting  Balance Static Sitting - Balance Support: Feet supported Static Sitting - Level of Assistance: 6: Modified independent (Device/Increase time) Dynamic Sitting Balance Dynamic Sitting - Balance Support: Feet supported Dynamic Sitting - Level of Assistance: 6: Modified independent (Device/Increase time) Static Standing Balance Static Standing - Balance Support: During functional activity;Bilateral upper extremity supported Static Standing - Level of Assistance: 5: Stand by assistance Dynamic Standing Balance Dynamic Standing - Balance Support: During functional activity;Bilateral upper extremity supported Dynamic Standing - Level of Assistance: 5: Stand by assistance Extremity Assessment      RLE Assessment RLE Assessment: Exceptions to [facility] RLE Strength RLE Overall Strength Comments: Grossly 3+/5 LLE Assessment LLE Assessment: Exceptions to [facility] LLE Strength LLE Overall Strength: Deficits;Due to pain LLE Overall Strength Comments: Grossly 3/5   Breck Coons 09/23/2021, 11:27 AM

## 2021-09-23 NOTE — Plan of Care (Signed)
Discharge 09/24/21

## 2021-09-23 NOTE — Plan of Care (Signed)
  Problem: RH Balance Goal: LTG: Patient will maintain dynamic sitting balance (OT) Description: LTG:  Patient will maintain dynamic sitting balance with assistance during activities of daily living (OT) Outcome: Completed/Met   Problem: RH Eating Goal: LTG Patient will perform eating w/assist, cues/equip (OT) Description: LTG: Patient will perform eating with assist, with/without cues using equipment (OT) Outcome: Completed/Met   Problem: RH Grooming Goal: LTG Patient will perform grooming w/assist,cues/equip (OT) Description: LTG: Patient will perform grooming with assist, with/without cues using equipment (OT) Outcome: Completed/Met   Problem: RH Bathing Goal: LTG Patient will bathe all body parts with assist levels (OT) Description: LTG: Patient will bathe all body parts with assist levels (OT) Outcome: Completed/Met   Problem: RH Dressing Goal: LTG Patient will perform upper body dressing (OT) Description: LTG Patient will perform upper body dressing with assist, with/without cues (OT). Outcome: Completed/Met Goal: LTG Patient will perform lower body dressing w/assist (OT) Description: LTG: Patient will perform lower body dressing with assist, with/without cues in positioning using equipment (OT) Outcome: Completed/Met    Problem: RH Toileting Goal: LTG Patient will perform toileting task (3/3 steps) with assistance level (OT) Description: LTG: Patient will perform toileting task (3/3 steps) with assistance level (OT)  Outcome: Completed/Met   Problem: RH Toilet Transfers Goal: LTG Patient will perform toilet transfers w/assist (OT) Description: LTG: Patient will perform toilet transfers with assist, with/without cues using equipment (OT) Outcome: Completed/Met   Problem: RH Tub/Shower Transfers Goal: LTG Patient will perform tub/shower transfers w/assist (OT) Description: LTG: Patient will perform tub/shower transfers with assist, with/without cues using equipment  (OT) Outcome: Completed/Met   Problem: RH Balance Goal: LTG Patient will maintain dynamic standing with ADLs (OT) Description: LTG:  Patient will maintain dynamic standing balance with assist during activities of daily living (OT)  Outcome: Completed/Met   Problem: Sit to Stand Goal: LTG:  Patient will perform sit to stand in prep for activites of daily living with assistance level (OT) Description: LTG:  Patient will perform sit to stand in prep for activites of daily living with assistance level (OT) Outcome: Completed/Met   Problem: RH Furniture Transfers Goal: LTG Patient will perform furniture transfers w/assist (OT/PT) Description: LTG: Patient will perform furniture transfers  with assistance (OT/PT). Outcome: Completed/Met

## 2021-09-24 MED ORDER — DARBEPOETIN ALFA 150 MCG/0.3ML IJ SOSY: 150.0000 ug | PREFILLED_SYRINGE | INTRAMUSCULAR | Status: AC

## 2021-09-24 MED ORDER — APIXABAN 2.5 MG PO TABS: 2.5000 mg | ORAL_TABLET | Freq: Two times a day (BID) | ORAL | 0 refills | Status: DC

## 2021-09-24 MED ORDER — OXYCODONE HCL 5 MG PO TABS
5.0000 mg | ORAL_TABLET | Freq: Three times a day (TID) | ORAL | 0 refills | Status: DC | PRN
Start: 2021-09-24 — End: 2022-07-07

## 2021-09-24 MED ORDER — DOXERCALCIFEROL 4 MCG/2ML IV SOLN: 8.0000 ug | INTRAVENOUS | Status: AC

## 2021-09-24 MED ORDER — ACETAMINOPHEN 325 MG PO TABS: 325.0000 mg | ORAL_TABLET | ORAL | Status: DC | PRN

## 2021-09-24 MED ORDER — VITAMIN D (ERGOCALCIFEROL) 1.25 MG (50000 UNIT) PO CAPS: 50000.0000 [IU] | ORAL_CAPSULE | ORAL | 0 refills | Status: DC

## 2021-09-24 MED ORDER — METHOCARBAMOL 500 MG PO TABS: 500.0000 mg | ORAL_TABLET | Freq: Four times a day (QID) | ORAL | 0 refills | Status: DC | PRN

## 2021-09-24 MED ORDER — RENA-VITE PO TABS: 1.0000 | ORAL_TABLET | Freq: Every day | ORAL | 0 refills | Status: DC

## 2021-09-24 NOTE — Progress Notes (Signed)
Patient ID: Kendra Clay, female   DOB: 26-Sep-1960, 61 y.o.   MRN: [id number]  SW confirmed with Rebecca/[facility] that pt is in place for dialysis transportation moving forward and will be picked up tomorrow.   *Pt went down to dialysis this morning on first shift vs second shift before above information could have been put in place. SW confirmed with nephrologist pt can be dialyzed on Friday.   SW left message for Rebecca/[facility] [phone] option #[phone]) to inform on pt being removed from dialysis schedule for Wednesday and being placed on schedule for Friday.   SW met with pt in room once she returned from dialysis to discuss above.   Loralee Pacas, MSW, [place] Office: [phone] Cell: [phone] Fax: [phone]

## 2021-09-24 NOTE — Procedures (Addendum)
Pt doing well today, for short HD 2.5 hrs and then for dc thereafter. UFG about 1.5 L today.   I was present at this dialysis session, have reviewed the session itself and made  appropriate changes Kelly Splinter MD [place] pager [phone]   09/24/2021, 2:15 PM

## 2021-09-24 NOTE — Progress Notes (Signed)
Patient being DC after dialysis today. Patient understands to attend her follow up appointments and continue with her dialysis appointments. Patient has no questions or concerns regarding DC. Patient states her son helps her at home and she will be ok with his help.

## 2021-09-24 NOTE — Progress Notes (Signed)
Contacted [place] to advise clinic of pt's d/c today and that pt will resume on Friday. Pt had treatment today due to pt informing staff that she did not have transportation to out-pt HD until Friday. CIR CSW was going to f/u with transportation agency this am to inquire about transport for HD on Wednesday but pt was taken on 1st shift.   Melven Sartorius Renal Navigator [phone]

## 2021-09-24 NOTE — Progress Notes (Signed)
Received patient in bed to unit.  Alert and oriented.  Informed consent signed and in chart.   Treatment initiated: 0815 Treatment completed: 1110  Patient tolerated well.  Transported back to the room  Alert, without acute distress.  Hand-off given to patient's nurse.   Access used: AVF Access issues: no  Total UF removed: 1500 Medication(s) given: aranesp and hectoral Post HD VS: 98.1, 98, 15, 130/72, 99% RA Post HD weight: 64.1 kg   [facility]

## 2021-09-24 NOTE — Progress Notes (Addendum)
PROGRESS NOTE   Subjective/Complaints: Pt seen and examined in dialysis unit. She was under the impression she did not have transport to dialysis tomorrow, so Nephrology assisted in giving her a "short" session today so she can resume her regular schedule on Friday. She assured she has home assistance with her son, who will be with her full time, and should have transport for dialysis on Friday.   She has been doing well from a pain control perspective. She denies using oxycodone at home prior to hospitalization, and states while she still needs them occasionally for pain in her left wrist. She does not have a follow up scheduled with orthopedics yet.   Pt is happy to be going home and has no questions or concerns regarding discharge. She is ready from a medical standpoint.   ROS: Patient denies fever, vomiting, cough, shortness of breath or chest pain, headache, or mood change.     Objective:   No results found. Recent Labs    09/23/21 1400  WBC 4.6  HGB 8.0*  HCT 25.2*  PLT 145*    Recent Labs    09/23/21 1400  NA 120*  K 3.7  CL 85*  CO2 24  GLUCOSE 103*  BUN 33*  CREATININE 7.39*  CALCIUM 8.3*     Intake/Output Summary (Last 24 hours) at 09/24/2021 0918 Last data filed at 09/23/2021 1715 Gross per 24 hour  Intake 240 ml  Output 2500 ml  Net -2260 ml         Physical Exam: Vital Signs Blood pressure 135/72, pulse 83, temperature 98.6 F (37 C), resp. rate (!) 23, height 5\' 3"  (1.6 m), weight 68.6 kg, SpO2 97 %.  Constitutional: No distress . Vital signs reviewed. HEENT: NCAT, EOMI, oral membranes moist Neck: supple Cardiovascular: RRR without murmur. No JVD    Respiratory/Chest: CTA Bilaterally without wheezes or rales. Normal effort    GI/Abdomen: BS +, non-tender, non-distended Ext: no clubbing, cyanosis. L>R 1+ peripheral edema. capillary refill brisk.  Psych: pleasant and cooperative  Skin:  Scattered bruising along R lower extremity; stable, L wrist splint and dressing c/d/I.  Neuro:  Alert and oriented x 3. No apparent deficits.  Musculoskeletal: Left wrist splint, Splint is snug and in appropriate alignment. BL UE antigravity, grip 5/5. BL LE antigravity.      Assessment/Plan: 1. Functional deficits which require 3+ hours per day of interdisciplinary therapy in a comprehensive inpatient rehab setting. Physiatrist is providing close team supervision and 24 hour management of active medical problems listed below. Physiatrist and rehab team continue to assess barriers to discharge/monitor patient progress toward functional and medical goals  Care Tool:  Bathing    Body parts bathed by patient: Left arm, Right arm, Abdomen, Chest, Front perineal area, Buttocks, Right upper leg, Left upper leg, Face, Left lower leg   Body parts bathed by helper: Right lower leg     Bathing assist Assist Level: Supervision/Verbal cueing     Upper Body Dressing/Undressing Upper body dressing   What is the patient wearing?: Pull over shirt    Upper body assist Assist Level: Supervision/Verbal cueing    Lower Body Dressing/Undressing Lower body dressing  What is the patient wearing?: Pants     Lower body assist Assist for lower body dressing: Supervision/Verbal cueing     Toileting Toileting Toileting Activity did not occur (Clothing management and hygiene only): N/A (no void or bm)  Toileting assist Assist for toileting: Supervision/Verbal cueing     Transfers Chair/bed transfer  Transfers assist  Chair/bed transfer activity did not occur: Safety/medical concerns  Chair/bed transfer assist level: Supervision/Verbal cueing Chair/bed transfer assistive device: Environmental consultant (PFRW)   Locomotion Ambulation   Ambulation assist   Ambulation activity did not occur: Safety/medical concerns  Assist level: Contact Guard/Touching assist Assistive device: Walker-platform (CAM  boot) Max distance: 4'   Walk 10 feet activity   Assist  Walk 10 feet activity did not occur: Safety/medical concerns        Walk 50 feet activity   Assist Walk 50 feet with 2 turns activity did not occur: Safety/medical concerns         Walk 150 feet activity   Assist Walk 150 feet activity did not occur: Safety/medical concerns         Walk 10 feet on uneven surface  activity   Assist Walk 10 feet on uneven surfaces activity did not occur: Safety/medical concerns         Wheelchair     Assist Is the patient using a wheelchair?: Yes (Per PT long term goals) Type of Wheelchair: Manual Wheelchair activity did not occur: Safety/medical concerns         Wheelchair 50 feet with 2 turns activity    Assist    Wheelchair 50 feet with 2 turns activity did not occur: Safety/medical concerns       Wheelchair 150 feet activity     Assist  Wheelchair 150 feet activity did not occur: Safety/medical concerns       Blood pressure 135/72, pulse 83, temperature 98.6 F (37 C), resp. rate (!) 23, height 5\' 3"  (1.6 m), weight 68.6 kg, SpO2 97 %.  Medical Problem List and Plan: 1. Functional deficits secondary to debility secondary to multiple fractures after fall underwent open reduction internal fixation of left distal femur fracture and ORIF of left distal radius fracture             - Medically cleared for discharge today             - patient may not shower             -ELOS/Goals:  PT/OT mod I            -Continue CIR therapies including PT, OT  2.  Antithrombotics: -DVT/anticoagulation:  Pharmaceutical: Eliquis             -antiplatelet therapy: none 3. Pain Management: Tylenol, oxycodone as needed - using approx BID - TID; will give 1 week oxy 5 mg TID PRN at discharge 4. Mood/Behavior/Sleep: LCSW to evaluate and provide emotional support             -antipsychotic agents: n/a 5. Neuropsych/cognition: This patient is capable of making  decisions on her own behalf. 6. Skin/Wound Care: Routine skin care checks            - R dorsal foot bruise 8/18 - resolved            - R ankle pain - add scheduled lidocaine patch to R medial ankle today; likely mild irritation from Cam boot - resolved             -  monitor surgical incisions. 8/20 sutures out. Leave sutures in wrist until follow up with ortho - patient needs to call ortho to schedule, will discuss as part of discharge planning today.  7. Fluids/Electrolytes/Nutrition: Strict Is and Os; nephrology following             -daily weights             -renal diet/1200 cc fluid restriction ongoing             - Electrolyte management per Nephro; Na 120 8/21, getting dialysis today 8/22 8: Left femur fracture s/p ORIF: WBAT 9:Left distal radius and ulnar fracture s/p ORIF             -Nonweightbearing through the wrist, continue splint  -there is mild tenderness but splint is appropriately fitting.   -encouraged her to avoid using wrist.  10: Right open tibial fracture s/p IM nail:WBAT             -wearing CAM boot with therapy/mobilization 11: ESRD on HD: M/W/F schedule per nephrology, she has a RUE AVF  -HD after therapies to allow for therapy participation 12: CAD s/p BMS to LAD in 2017: Stable, denies chest pain             -follows with Dr. Otho Perl (last seen 05/2021) 13: Covid pneumonia: treated and off precautions, denies SOB             -continue FV; Mucinex - improved  -sx  improved   14: Vitamin D deficiency: continue supplementation 15: Chronic heart failure (EF 40% in 2017): stable (normal left ventricular systolic function)            Filed Weights   09/23/21 0555 09/23/21 1340 09/24/21 0815  Weight: 64.5 kg 67.3 kg 68.6 kg    Weights up an down with HD but generally has been stable 16: Emphysema: FV/IS 17: Pancytopenia,   18: Anemia: CKD/ABL              -continue Aranesp, Auryxia per nephrology             -HGB stable ~7 - may be contributing to s/s  orthostasis, fatigue given Hx CHF, however would not liberalize transfusion parameters at this time given HD  -blood transfusion as needed with HD 19: GERD: continue Protonix 20: Hyperlipidemia: continue Lipitor 21: Labile BP, Orthostasis: hydralazine 10 mg q 6 hours prn sys BP > 170             -midodrine 5 mg as needed only at the start of dialysis    - 8/19 intermittent dizziness. Likely driven by volume loss in HD. - improved -discussed making sure she's drinking adequate fluids - reinforced 8/21 -continue to take rest breaks as needed/ acclimation  22. Constipation - improved  -+ results with sorbitol 8/14; 8/20 refused sorbitol    -continue senna-s             - LBM recorded 8/15 - > 8/20, large, loose  -importance of bm has been reviewed with pt 23. Nausea - ongoing, generally associated with orthostasis  -prn zofran, reglan              - Component of  functional 24. Decreased PO intake - improved  -Dietician consult completed 8/17  -Continue prosource plus  -reviewing importance of keeping bowels moving also   LOS: 15 days A FACE TO [place] 09/24/2021, 9:18 AM

## 2021-09-24 NOTE — Progress Notes (Signed)
Inpatient Rehabilitation Care Coordinator Discharge Note   Patient Details  Name: Kendra Clay MRN: [id number] Date of Birth: 03/02/1960   Discharge location: D/c to home  Length of Stay: 14 days  Discharge activity level: Supervision  Home/community participation: Limited  Patient response TM:LYYTKP Literacy - How often do you need to have someone help you when you read instructions, pamphlets, or other written material from your doctor or pharmacy?: Never  Patient response TW:SFKCLE Isolation - How often do you feel lonely or isolated from those around you?: Rarely  Services provided included: MD, RD, PT, OT, CM, TR, Pharmacy, Neuropsych, SW, RN  Financial Services:  Charity fundraiser Utilized: [employer]  Choices offered to/list presented to: Yes  Follow-up services arranged:  [place], DME Home Health Agency: [facility] for HHPT/OT/aide    DME : [place] for L platform RW , w/c and hospital bed    Patient response to transportation need: Is the patient able to respond to transportation needs?: Yes In the past 12 months, has lack of transportation kept you from medical appointments or from getting medications?: No In the past 12 months, has lack of transportation kept you from meetings, work, or from getting things needed for daily living?: No   Comments (or additional information):  Patient/Family verbalized understanding of follow-up arrangements:  Yes  Individual responsible for coordination of the follow-up plan: contact pt  Confirmed correct DME delivered: Rana Snare 09/24/2021    Rana Snare

## 2021-09-25 ENCOUNTER — Telehealth: Payer: Self-pay | Admitting: Physician Assistant

## 2021-09-25 NOTE — Telephone Encounter (Signed)
Transition of care contact from inpatient facility  Date of discharge: 09/24/21 Date of contact: 09/25/21 Method: Phone Spoke to: Patient  Patient contacted to discuss transition of care from recent inpatient hospitalization. Patient was admitted to [facility] from  with discharge diagnosis of multiple fractures s/p CIR  Medication changes were reviewed. Reports she is picking up her meds today. No other needs identified, reports she is doing well.   Patient will follow up with his/her outpatient HD unit on: Friday, 09/29/21  Anice Paganini, PA-C 09/25/2021, 1:08 PM  [facility] Pager: [phone]

## 2021-09-30 ENCOUNTER — Telehealth: Payer: Self-pay | Admitting: *Deleted

## 2021-09-30 NOTE — Telephone Encounter (Signed)
Per Kendra Clay, her kidney doctor told her she did not have to take the Eliquis any longer. No further action will be taken on the PA.

## 2021-09-30 NOTE — Telephone Encounter (Signed)
Received a fax from [facility] for [place] for Eliquis. Per discharge instructions she was only supposed to take for 11 days, and typically they are given a discount card at discharge. I have placed a call to Kendra Clay to find out the status of the Eliquis.

## 2021-10-12 ENCOUNTER — Other Ambulatory Visit: Payer: Self-pay

## 2021-10-12 ENCOUNTER — Emergency Department (HOSPITAL_COMMUNITY): Payer: Medicare Other

## 2021-10-12 ENCOUNTER — Inpatient Hospital Stay (HOSPITAL_COMMUNITY)
Admission: EM | Admit: 2021-10-12 | Discharge: 2021-10-15 | DRG: 291 | Disposition: A | Discharge status: Home Health | Payer: Medicare Other | Attending: Internal Medicine | Admitting: Internal Medicine

## 2021-10-12 DIAGNOSIS — K219 Gastro-esophageal reflux disease without esophagitis: Secondary | ICD-10-CM | POA: Diagnosis present

## 2021-10-12 DIAGNOSIS — Z882 Allergy status to sulfonamides status: Secondary | ICD-10-CM

## 2021-10-12 DIAGNOSIS — X58XXXA Exposure to other specified factors, initial encounter: Secondary | ICD-10-CM | POA: Diagnosis present

## 2021-10-12 DIAGNOSIS — I2510 Atherosclerotic heart disease of native coronary artery without angina pectoris: Secondary | ICD-10-CM | POA: Diagnosis present

## 2021-10-12 DIAGNOSIS — L8990 Pressure ulcer of unspecified site, unspecified stage: Secondary | ICD-10-CM | POA: Insufficient documentation

## 2021-10-12 DIAGNOSIS — Z20822 Contact with and (suspected) exposure to covid-19: Secondary | ICD-10-CM | POA: Diagnosis present

## 2021-10-12 DIAGNOSIS — Z955 Presence of coronary angioplasty implant and graft: Secondary | ICD-10-CM

## 2021-10-12 DIAGNOSIS — E871 Hypo-osmolality and hyponatremia: Secondary | ICD-10-CM | POA: Diagnosis present

## 2021-10-12 DIAGNOSIS — I5043 Acute on chronic combined systolic (congestive) and diastolic (congestive) heart failure: Secondary | ICD-10-CM | POA: Diagnosis present

## 2021-10-12 DIAGNOSIS — J810 Acute pulmonary edema: Secondary | ICD-10-CM | POA: Diagnosis present

## 2021-10-12 DIAGNOSIS — I132 Hypertensive heart and chronic kidney disease with heart failure and with stage 5 chronic kidney disease, or end stage renal disease: Principal | ICD-10-CM | POA: Diagnosis present

## 2021-10-12 DIAGNOSIS — I509 Heart failure, unspecified: Secondary | ICD-10-CM

## 2021-10-12 DIAGNOSIS — Z90710 Acquired absence of both cervix and uterus: Secondary | ICD-10-CM

## 2021-10-12 DIAGNOSIS — Z87891 Personal history of nicotine dependence: Secondary | ICD-10-CM

## 2021-10-12 DIAGNOSIS — J189 Pneumonia, unspecified organism: Principal | ICD-10-CM

## 2021-10-12 DIAGNOSIS — R17 Unspecified jaundice: Secondary | ICD-10-CM | POA: Diagnosis present

## 2021-10-12 DIAGNOSIS — J9601 Acute respiratory failure with hypoxia: Secondary | ICD-10-CM | POA: Diagnosis present

## 2021-10-12 DIAGNOSIS — Q613 Polycystic kidney, unspecified: Secondary | ICD-10-CM

## 2021-10-12 DIAGNOSIS — I420 Dilated cardiomyopathy: Secondary | ICD-10-CM | POA: Diagnosis present

## 2021-10-12 DIAGNOSIS — Z825 Family history of asthma and other chronic lower respiratory diseases: Secondary | ICD-10-CM

## 2021-10-12 DIAGNOSIS — N186 End stage renal disease: Secondary | ICD-10-CM | POA: Diagnosis present

## 2021-10-12 DIAGNOSIS — Z7901 Long term (current) use of anticoagulants: Secondary | ICD-10-CM

## 2021-10-12 DIAGNOSIS — L89151 Pressure ulcer of sacral region, stage 1: Secondary | ICD-10-CM | POA: Diagnosis present

## 2021-10-12 DIAGNOSIS — Z8616 Personal history of COVID-19: Secondary | ICD-10-CM

## 2021-10-12 DIAGNOSIS — Z79899 Other long term (current) drug therapy: Secondary | ICD-10-CM

## 2021-10-12 DIAGNOSIS — Z881 Allergy status to other antibiotic agents status: Secondary | ICD-10-CM

## 2021-10-12 DIAGNOSIS — D638 Anemia in other chronic diseases classified elsewhere: Secondary | ICD-10-CM

## 2021-10-12 DIAGNOSIS — Z992 Dependence on renal dialysis: Secondary | ICD-10-CM

## 2021-10-12 DIAGNOSIS — Z888 Allergy status to other drugs, medicaments and biological substances status: Secondary | ICD-10-CM

## 2021-10-12 DIAGNOSIS — S81801A Unspecified open wound, right lower leg, initial encounter: Secondary | ICD-10-CM | POA: Diagnosis present

## 2021-10-12 DIAGNOSIS — D696 Thrombocytopenia, unspecified: Secondary | ICD-10-CM | POA: Diagnosis present

## 2021-10-12 DIAGNOSIS — D631 Anemia in chronic kidney disease: Secondary | ICD-10-CM | POA: Diagnosis present

## 2021-10-12 DIAGNOSIS — J441 Chronic obstructive pulmonary disease with (acute) exacerbation: Secondary | ICD-10-CM | POA: Diagnosis present

## 2021-10-12 LAB — CBC WITH DIFFERENTIAL/PLATELET
Abs Immature Granulocytes: 0.01 10*3/uL (ref 0.00–0.07)
Basophils Absolute: 0 10*3/uL (ref 0.0–0.1)
Basophils Relative: 1 %
Eosinophils Absolute: 0.1 10*3/uL (ref 0.0–0.5)
Eosinophils Relative: 3 %
HCT: 34.7 % — ABNORMAL LOW (ref 36.0–46.0)
Hemoglobin: 10.8 g/dL — ABNORMAL LOW (ref 12.0–15.0)
Immature Granulocytes: 0 %
Lymphocytes Relative: 29 %
Lymphs Abs: 1.2 10*3/uL (ref 0.7–4.0)
MCH: 30.4 pg (ref 26.0–34.0)
MCHC: 31.1 g/dL (ref 30.0–36.0)
MCV: 97.7 fL (ref 80.0–100.0)
Monocytes Absolute: 0.4 10*3/uL (ref 0.1–1.0)
Monocytes Relative: 9 %
Neutro Abs: 2.4 10*3/uL (ref 1.7–7.7)
Neutrophils Relative %: 58 %
Platelets: 103 10*3/uL — ABNORMAL LOW (ref 150–400)
RBC: 3.55 MIL/uL — ABNORMAL LOW (ref 3.87–5.11)
RDW: 17.9 % — ABNORMAL HIGH (ref 11.5–15.5)
WBC: 4.1 10*3/uL (ref 4.0–10.5)
nRBC: 0 % (ref 0.0–0.2)

## 2021-10-12 LAB — BASIC METABOLIC PANEL
Anion gap: 12 (ref 5–15)
BUN: 13 mg/dL (ref 6–20)
CO2: 25 mmol/L (ref 22–32)
Calcium: 8.5 mg/dL — ABNORMAL LOW (ref 8.9–10.3)
Chloride: 95 mmol/L — ABNORMAL LOW (ref 98–111)
Creatinine, Ser: 5.31 mg/dL — ABNORMAL HIGH (ref 0.44–1.00)
GFR, Estimated: 9 mL/min — ABNORMAL LOW (ref >60)
Glucose, Bld: 95 mg/dL (ref 70–99)
Potassium: 3.8 mmol/L (ref 3.5–5.1)
Sodium: 132 mmol/L — ABNORMAL LOW (ref 135–145)

## 2021-10-12 LAB — I-STAT VENOUS BLOOD GAS, ED
Acid-Base Excess: 5 mmol/L — ABNORMAL HIGH (ref 0.0–2.0)
Bicarbonate: 27.6 mmol/L (ref 20.0–28.0)
Calcium, Ion: 1.06 mmol/L — ABNORMAL LOW (ref 1.15–1.40)
HCT: 34 % — ABNORMAL LOW (ref 36.0–46.0)
Hemoglobin: 11.6 g/dL — ABNORMAL LOW (ref 12.0–15.0)
O2 Saturation: 60 %
Potassium: 3.8 mmol/L (ref 3.5–5.1)
Sodium: 131 mmol/L — ABNORMAL LOW (ref 135–145)
TCO2: 29 mmol/L (ref 22–32)
pCO2, Ven: 34.6 mmHg — ABNORMAL LOW (ref 44–60)
pH, Ven: 7.511 — ABNORMAL HIGH (ref 7.25–7.43)
pO2, Ven: 28 mmHg — CL (ref 32–45)

## 2021-10-12 LAB — BRAIN NATRIURETIC PEPTIDE: B Natriuretic Peptide: >4500 pg/mL — ABNORMAL HIGH (ref 0.0–100.0)

## 2021-10-12 MED ORDER — IPRATROPIUM-ALBUTEROL 0.5-2.5 (3) MG/3ML IN SOLN
3.0000 mL | Freq: Once | RESPIRATORY_TRACT | Status: AC
  Administered 2021-10-12: 3 mL via RESPIRATORY_TRACT
  Filled 2021-10-12: qty 3

## 2021-10-12 MED ORDER — SODIUM CHLORIDE 0.9 % IV SOLN
1.0000 g | Freq: Once | INTRAVENOUS | Status: AC
  Administered 2021-10-12: 1 g via INTRAVENOUS
  Filled 2021-10-12: qty 10

## 2021-10-12 MED ORDER — SODIUM CHLORIDE 0.9 % IV SOLN
100.0000 mg | Freq: Once | INTRAVENOUS | Status: AC
  Administered 2021-10-12: 100 mg via INTRAVENOUS
  Filled 2021-10-12: qty 100

## 2021-10-12 NOTE — ED Triage Notes (Signed)
Pt BIB EMS c/o SOB when laying down. Pt went to dialysis yesterday and feels like she "can't wait for dialysis until Monday". MWF dialysis. Bilateral leg swelling. AO4

## 2021-10-12 NOTE — ED Provider Notes (Signed)
[facility] Provider Note   CSN: [id number] Arrival date & time: 10/12/21  2049     History {Add pertinent medical, surgical, social history, OB history to HPI:1} Chief Complaint  Patient presents with   Shortness of Breath    [facility] is a 61 y.o. female.  Patient as above with significant medical history as below, including   ESRD on HD MWF last HD Friday received full session, positive kidney disease, chronic anemia, HFrEF/dilated cardiomyopathy ([facility] NP), COPD not on [place] -Started on carvedilol 9/6 by her cardiology team -Discharged from [facility] 8/15 following admission a/w fall, R tib fx, L femur fx, L distal radius fx.    who presents to the ED with complaint of dib. Pt reports dib over the last 2 days, feels like prior CHF exacerbation. She is compliant with HD. No longer on lasix, does still make small amount of urine. Orthopnea. Dib when she even leans over to the side. No cough, No CP or n/v. Leg swelling BL LE worse than her baseline. No GU complaints, no falls, no fevers or chills.      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 Dr Deatra Ina 30 years ago. 2D ECHO  30 yearsa go.   History of blood transfusion    C- Section   History of bronchitis    numerous, last time> 1 year   Hypertension    Night muscle spasms    legs   Polycystic kidney disease    Genetic dx 23 years ago   PONV (postoperative nausea and vomiting)    patch helped   Scoliosis    Strabismus     Past Surgical History:  Procedure Laterality Date   ABDOMINAL HYSTERECTOMY     AV FISTULA PLACEMENT  09/01/2011   Procedure: ARTERIOVENOUS (AV) FISTULA CREATION;  Surgeon: Rosetta Posner, MD;  Location: [place];  Service: Vascular;  Laterality: Right;   [place]     for lazy  eye   ORIF FEMUR FRACTURE Left 08/29/2021   Procedure: OPEN REDUCTION INTERNAL FIXATION (ORIF) DISTAL FEMUR FRACTURE;  Surgeon: Shona Needles, MD;  Location: [place];  Service: Orthopedics;  Laterality: Left;   ORIF WRIST FRACTURE Left 08/29/2021   Procedure: OPEN REDUCTION INTERNAL FIXATION (ORIF) WRIST FRACTURE;  Surgeon: Shona Needles, MD;  Location: [place];  Service: Orthopedics;  Laterality: Left;   TIBIA IM NAIL INSERTION Left 05/19/2018   Procedure: INTRAMEDULLARY (IM) NAIL TIBIAL;  Surgeon: Hiram Gash, MD;  Location: [place];  Service: Orthopedics;  Laterality: Left;   TIBIA IM NAIL INSERTION Right 08/29/2021   Procedure: INTRAMEDULLARY (IM) NAIL TIBIAL WITH IRRIGATION AND DEBRIDMENT;  Surgeon: Shona Needles, MD;  Location: [place];  Service: Orthopedics;  Laterality: Right;     The history is provided by the patient. No language interpreter was used.  Shortness of Breath Associated symptoms: no abdominal pain, no chest pain, no cough, no fever, no headaches and no rash        Home Medications Prior to Admission medications   Medication Sig Start Date End Date Taking? Authorizing Provider  acetaminophen (TYLENOL) 325 MG tablet Take 1-2 tablets (325-650 mg total) by mouth every 4 (four) hours as needed  for mild pain. 09/24/21   Setzer, Edman Circle, PA-C  apixaban (ELIQUIS) 2.5 MG TABS tablet Take 1 tablet (2.5 mg total) by mouth 2 (two) times daily for 6 days. 09/24/21 09/30/21  Setzer, Edman Circle, PA-C  atorvastatin (LIPITOR) 40 MG tablet Take 40 mg by mouth at bedtime.    [provider]  cinacalcet (SENSIPAR) 30 MG tablet Take 30 mg by mouth daily with supper.    [provider]  Darbepoetin Alfa (ARANESP) 150 MCG/0.3ML SOSY injection Inject 0.3 mLs (150 mcg total) into the vein every Wednesday with hemodialysis. 09/25/21   Setzer, Edman Circle, PA-C  dextromethorphan-guaiFENesin (MUCINEX DM) 30-600 MG 12hr tablet Take 2 tablets by mouth as needed for cough.    [provider]  doxercalciferol (HECTOROL) 4 MCG/2ML injection Inject 4 mLs (8 mcg total) into the vein every Monday, Wednesday, and Friday with hemodialysis. 09/25/21   Setzer, Edman Circle, PA-C  ferric citrate (AURYXIA) 1 GM 210 MG(Fe) tablet Take 210 mg by mouth 3 (three) times daily with meals.    [provider]  lidocaine (LIDODERM) 5 % Place 1 patch onto the skin daily. Remove & Discard patch within 12 hours or as directed by MD Patient not taking: Reported on 07/31/2020 04/26/20   Jonetta Osgood, MD  loperamide (IMODIUM A-D) 2 MG tablet Take 2 mg by mouth as needed for diarrhea or loose stools.    [provider]  loratadine (CLARITIN) 10 MG tablet Take 10 mg by mouth daily as needed for allergies.    [provider]  methocarbamol (ROBAXIN) 500 MG tablet Take 1 tablet (500 mg total) by mouth every 6 (six) hours as needed for muscle spasms. 09/24/21   Setzer, Edman Circle, PA-C  midodrine (PROAMATINE) 5 MG tablet Take 1 tablet (5 mg total) by mouth as needed (for hypotension during dialysis). 09/09/21   Mariel Aloe, MD  multivitamin (RENA-VIT) TABS tablet Take 1 tablet by mouth at bedtime. 09/24/21   Setzer, Edman Circle, PA-C  nitroGLYCERIN (NITROSTAT) 0.4 MG SL tablet Place 0.4 mg under the tongue every 5 (five) minutes as needed for chest pain.    [provider]  oxyCODONE (OXY IR/ROXICODONE) 5 MG immediate release tablet Take 1 tablet (5 mg total) by mouth every 8 (eight) hours as needed for severe pain. 09/24/21   Setzer, Edman Circle, PA-C  pantoprazole (PROTONIX) 40 MG tablet Take 40 mg by mouth 2 (two) times daily. 02/26/20   [provider]  polyethylene glycol powder (MIRALAX) powder Start taking 1 capful 3 times a day. Slowly cut back as needed until you have normal bowel movements. Patient taking differently: Take 17 g by mouth daily as needed for mild constipation. 08/17/17   Fatima Blank, MD  VENTOLIN HFA 108 [phone] Base) MCG/ACT inhaler INHALE  2 PUFFS INTO THE LUNGS EVERY 6 HOURS AS NEEDED Patient not taking: Reported on 08/31/2021 12/31/20   Spero Geralds, MD  Vitamin D, Ergocalciferol, (DRISDOL) 1.25 MG (50000 UNIT) CAPS capsule Take 1 capsule (50,000 Units total) by mouth every 7 (seven) days. 09/24/21   Setzer, Edman Circle, PA-C      Allergies    E-mycin [erythromycin], Sulfa antibiotics, and Lisinopril    Review of Systems   Review of Systems  Constitutional:  Positive for fatigue. Negative for activity change and fever.  HENT:  Negative for facial swelling and trouble swallowing.   Eyes:  Negative for discharge and redness.  Respiratory:  Positive for shortness of  breath. Negative for cough.   Cardiovascular:  Positive for leg swelling. Negative for chest pain and palpitations.  Gastrointestinal:  Negative for abdominal pain and nausea.  Genitourinary:  Negative for dysuria and flank pain.  Musculoskeletal:  Negative for back pain and gait problem.  Skin:  Negative for pallor and rash.  Neurological:  Negative for syncope and headaches.    Physical Exam Updated Vital Signs BP (!) 178/108   Pulse 89   Temp 97.8 F (36.6 C) (Oral)   Resp (!) 24   SpO2 99%  Physical Exam Vitals and nursing note reviewed.  Constitutional:      General: She is not in acute distress.    Appearance: Normal appearance. She is well-developed.  HENT:     Head: Normocephalic and atraumatic.     Right Ear: External ear normal.     Left Ear: External ear normal.     Nose: Nose normal.     Mouth/Throat:     Mouth: Mucous membranes are moist.  Eyes:     General: No scleral icterus.       Right eye: No discharge.        Left eye: No discharge.  Cardiovascular:     Rate and Rhythm: Normal rate and regular rhythm.     Pulses: Normal pulses.     Heart sounds: Normal heart sounds.  Pulmonary:     Effort: Pulmonary effort is normal. Tachypnea present. No accessory muscle usage or respiratory distress.     Breath sounds: Decreased breath  sounds and wheezing present.  Abdominal:     General: Abdomen is flat.     Tenderness: There is no abdominal tenderness.  Musculoskeletal:        General: Normal range of motion.     Cervical back: Normal range of motion.     Right lower leg: Edema present.     Left lower leg: Edema present.  Skin:    General: Skin is warm and dry.     Capillary Refill: Capillary refill takes less than 2 seconds.  Neurological:     Mental Status: She is alert and oriented to person, place, and time.     GCS: GCS eye subscore is 4. GCS verbal subscore is 5. GCS motor subscore is 6.  Psychiatric:        Mood and Affect: Mood normal.        Behavior: Behavior normal.     ED Results / Procedures / Treatments   Labs (all labs ordered are listed, but only abnormal results are displayed) Labs Reviewed  BASIC METABOLIC PANEL - Abnormal; Notable for the following components:      Result Value   Sodium 132 (*)    Chloride 95 (*)    Creatinine, Ser 5.31 (*)    Calcium 8.5 (*)    GFR, Estimated 9 (*)    All other components within normal limits  CBC WITH DIFFERENTIAL/PLATELET - Abnormal; Notable for the following components:   RBC 3.55 (*)    Hemoglobin 10.8 (*)    HCT 34.7 (*)    RDW 17.9 (*)    Platelets 103 (*)    All other components within normal limits  BRAIN NATRIURETIC PEPTIDE - Abnormal; Notable for the following components:   B Natriuretic Peptide >4,500.0 (*)    All other components within normal limits  I-STAT VENOUS BLOOD GAS, ED - Abnormal; Notable for the following components:   pH, Ven 7.511 (*)    pCO2, Ven  34.6 (*)    pO2, Ven 28 (*)    Acid-Base Excess 5.0 (*)    Sodium 131 (*)    Calcium, Ion 1.06 (*)    HCT 34.0 (*)    Hemoglobin 11.6 (*)    All other components within normal limits  RESP PANEL BY RT-PCR (FLU A&B, COVID) ARPGX2  EXPECTORATED SPUTUM ASSESSMENT W GRAM STAIN, RFLX TO RESP C  BLOOD GAS, VENOUS  LEGIONELLA PNEUMOPHILA SEROGP 1 UR AG     EKG None  Radiology DG Chest 2 View  Result Date: 10/12/2021 CLINICAL DATA:  Shortness of breath EXAM: CHEST - 2 VIEW COMPARISON:  08/22/2021 FINDINGS: Mild patchy right lower lobe opacity, suspicious for pneumonia. Increased interstitial markings, raising the possibility of superimposed interstitial edema. No definite pleural effusions. Cardiomegaly. Mild degenerative changes of the visualized thoracolumbar spine. IMPRESSION: Mild patchy right lower lobe opacity, suspicious for pneumonia. Cardiomegaly with possible mild superimposed interstitial edema. Electronically Signed   By: Julian Hy M.D.   On: 10/12/2021 22:30    Procedures Procedures  {Document cardiac monitor, telemetry assessment procedure when appropriate:1}  Medications Ordered in ED Medications  cefTRIAXone (ROCEPHIN) 1 g in sodium chloride 0.9 % 100 mL IVPB (has no administration in time range)  doxycycline (VIBRAMYCIN) 100 mg in sodium chloride 0.9 % 250 mL IVPB (has no administration in time range)  ipratropium-albuterol (DUONEB) 0.5-2.5 (3) MG/3ML nebulizer solution 3 mL (has no administration in time range)    ED Course/ Medical Decision Making/ A&P Clinical Course as of 10/12/21 2348  Sat Oct 12, 2021  2347 PORT/PSI is 81 [SG]    Clinical Course User Index [SG] Jeanell Sparrow, DO                           Medical Decision Making Amount and/or Complexity of Data Reviewed Labs: ordered. Radiology: ordered.  Risk Prescription drug management.   This patient presents to the ED with chief complaint(s) of dib with pertinent past medical history of above which further complicates the presenting complaint. The complaint involves an extensive differential diagnosis and also carries with it a high risk of complications and morbidity.    In my evaluation of this patient's dyspnea my DDx includes, but is not limited to, pneumonia, pulmonary embolism, pneumothorax, pulmonary edema, metabolic acidosis,  asthma, COPD, cardiac cause, anemia, anxiety, etc.   Serious etiologies were considered.   The initial plan is to screening labs/imaging    Additional history obtained: Additional history obtained from  n/a Records reviewed previous admission documents, Primary Care Documents, and prior ed notes, prior labs/imaging  Independent labs interpretation:  The following labs were independently interpreted:  BNP >4500, Na is 132, VBG w/ pH 7.5, CO2 is 34, bicarb is 27.6  Independent visualization of imaging: - I independently visualized the following imaging with scope of interpretation limited to determining acute life threatening conditions related to emergency care: CXR, which revealed RLL pna, interstitial edema  Cardiac monitoring was reviewed and interpreted by myself which shows nsr  Treatment and Reassessment: Rocephin/doxy Duoneb >> mildly improved however if patient will move or try to lie down she becomes very dyspneic and desaturate.    Consultation: - Consulted or discussed management/test interpretation w/ external professional: n/a  Consideration for admission or further workup: Admission was considered   Pt with likely CAP, she is on RA; also imaging/labs concerning for fluid overload likely a/w CHF, volume overload. Would likely benefit from diuresis. Her  PORT score is 80 pts, she is unable to ambulate given dyspnea, she has orthopnea. Would favor admission at this time for abx and dialysis; pt is agreeable   Social Determinants of health: Social History   Tobacco Use   Smoking status: Former    Packs/day: 1.50    Years: 30.00    Total pack years: 45.00    Types: Cigarettes    Start date: 1982    Quit date: 01/10/2015    Years since quitting: 6.7   Smokeless tobacco: Former    Quit date: 01/09/2014  Substance Use Topics   Alcohol use: No    Alcohol/week: 0.0 standard drinks of alcohol   Drug use: No      {Document critical care time when  appropriate:1} {Document review of labs and clinical decision tools ie heart score, Chads2Vasc2 etc:1}  {Document your independent review of radiology images, and any outside records:1} {Document your discussion with family members, caretakers, and with consultants:1} {Document social determinants of health affecting pt's care:1} {Document your decision making why or why not admission, treatments were needed:1} Final Clinical Impression(s) / ED Diagnoses Final diagnoses:  Acute on chronic congestive heart failure, unspecified heart failure type ([place])  Pneumonia due to infectious organism, unspecified laterality, unspecified part of lung    Rx / DC Orders ED Discharge Orders     None

## 2021-10-13 ENCOUNTER — Encounter (HOSPITAL_COMMUNITY): Payer: Self-pay | Admitting: Internal Medicine

## 2021-10-13 DIAGNOSIS — Z992 Dependence on renal dialysis: Secondary | ICD-10-CM | POA: Diagnosis not present

## 2021-10-13 DIAGNOSIS — I5043 Acute on chronic combined systolic (congestive) and diastolic (congestive) heart failure: Secondary | ICD-10-CM

## 2021-10-13 DIAGNOSIS — I2510 Atherosclerotic heart disease of native coronary artery without angina pectoris: Secondary | ICD-10-CM | POA: Diagnosis present

## 2021-10-13 DIAGNOSIS — X58XXXA Exposure to other specified factors, initial encounter: Secondary | ICD-10-CM | POA: Diagnosis present

## 2021-10-13 DIAGNOSIS — N186 End stage renal disease: Secondary | ICD-10-CM | POA: Diagnosis present

## 2021-10-13 DIAGNOSIS — J441 Chronic obstructive pulmonary disease with (acute) exacerbation: Secondary | ICD-10-CM

## 2021-10-13 DIAGNOSIS — J810 Acute pulmonary edema: Secondary | ICD-10-CM

## 2021-10-13 DIAGNOSIS — D696 Thrombocytopenia, unspecified: Secondary | ICD-10-CM | POA: Diagnosis present

## 2021-10-13 DIAGNOSIS — D638 Anemia in other chronic diseases classified elsewhere: Secondary | ICD-10-CM | POA: Diagnosis not present

## 2021-10-13 DIAGNOSIS — Z825 Family history of asthma and other chronic lower respiratory diseases: Secondary | ICD-10-CM | POA: Diagnosis not present

## 2021-10-13 DIAGNOSIS — E871 Hypo-osmolality and hyponatremia: Secondary | ICD-10-CM | POA: Diagnosis present

## 2021-10-13 DIAGNOSIS — Q613 Polycystic kidney, unspecified: Secondary | ICD-10-CM | POA: Diagnosis not present

## 2021-10-13 DIAGNOSIS — I420 Dilated cardiomyopathy: Secondary | ICD-10-CM | POA: Diagnosis present

## 2021-10-13 DIAGNOSIS — Z90710 Acquired absence of both cervix and uterus: Secondary | ICD-10-CM | POA: Diagnosis not present

## 2021-10-13 DIAGNOSIS — I132 Hypertensive heart and chronic kidney disease with heart failure and with stage 5 chronic kidney disease, or end stage renal disease: Secondary | ICD-10-CM | POA: Diagnosis present

## 2021-10-13 DIAGNOSIS — K219 Gastro-esophageal reflux disease without esophagitis: Secondary | ICD-10-CM | POA: Diagnosis present

## 2021-10-13 DIAGNOSIS — Z87891 Personal history of nicotine dependence: Secondary | ICD-10-CM | POA: Diagnosis not present

## 2021-10-13 DIAGNOSIS — Z20822 Contact with and (suspected) exposure to covid-19: Secondary | ICD-10-CM | POA: Diagnosis present

## 2021-10-13 DIAGNOSIS — Z79899 Other long term (current) drug therapy: Secondary | ICD-10-CM | POA: Diagnosis not present

## 2021-10-13 DIAGNOSIS — D649 Anemia, unspecified: Secondary | ICD-10-CM

## 2021-10-13 DIAGNOSIS — D631 Anemia in chronic kidney disease: Secondary | ICD-10-CM | POA: Diagnosis present

## 2021-10-13 DIAGNOSIS — J9601 Acute respiratory failure with hypoxia: Secondary | ICD-10-CM | POA: Diagnosis present

## 2021-10-13 DIAGNOSIS — Z8616 Personal history of COVID-19: Secondary | ICD-10-CM | POA: Diagnosis not present

## 2021-10-13 DIAGNOSIS — S81801A Unspecified open wound, right lower leg, initial encounter: Secondary | ICD-10-CM | POA: Diagnosis present

## 2021-10-13 DIAGNOSIS — L89151 Pressure ulcer of sacral region, stage 1: Secondary | ICD-10-CM | POA: Diagnosis present

## 2021-10-13 DIAGNOSIS — R17 Unspecified jaundice: Secondary | ICD-10-CM | POA: Diagnosis present

## 2021-10-13 LAB — CBC WITH DIFFERENTIAL/PLATELET
Abs Immature Granulocytes: 0.02 10*3/uL (ref 0.00–0.07)
Basophils Absolute: 0 10*3/uL (ref 0.0–0.1)
Basophils Relative: 1 %
Eosinophils Absolute: 0.1 10*3/uL (ref 0.0–0.5)
Eosinophils Relative: 1 %
HCT: 33.2 % — ABNORMAL LOW (ref 36.0–46.0)
Hemoglobin: 10.2 g/dL — ABNORMAL LOW (ref 12.0–15.0)
Immature Granulocytes: 1 %
Lymphocytes Relative: 24 %
Lymphs Abs: 1.1 10*3/uL (ref 0.7–4.0)
MCH: 30.5 pg (ref 26.0–34.0)
MCHC: 30.7 g/dL (ref 30.0–36.0)
MCV: 99.4 fL (ref 80.0–100.0)
Monocytes Absolute: 0.4 10*3/uL (ref 0.1–1.0)
Monocytes Relative: 9 %
Neutro Abs: 2.8 10*3/uL (ref 1.7–7.7)
Neutrophils Relative %: 64 %
Platelets: 104 10*3/uL — ABNORMAL LOW (ref 150–400)
RBC: 3.34 MIL/uL — ABNORMAL LOW (ref 3.87–5.11)
RDW: 18 % — ABNORMAL HIGH (ref 11.5–15.5)
WBC: 4.4 10*3/uL (ref 4.0–10.5)
nRBC: 0 % (ref 0.0–0.2)

## 2021-10-13 LAB — COMPREHENSIVE METABOLIC PANEL
ALT: 13 U/L (ref 0–44)
AST: 23 U/L (ref 15–41)
Albumin: 2.3 g/dL — ABNORMAL LOW (ref 3.5–5.0)
Alkaline Phosphatase: 247 U/L — ABNORMAL HIGH (ref 38–126)
Anion gap: 14 (ref 5–15)
BUN: 14 mg/dL (ref 6–20)
CO2: 23 mmol/L (ref 22–32)
Calcium: 8.4 mg/dL — ABNORMAL LOW (ref 8.9–10.3)
Chloride: 93 mmol/L — ABNORMAL LOW (ref 98–111)
Creatinine, Ser: 5.63 mg/dL — ABNORMAL HIGH (ref 0.44–1.00)
GFR, Estimated: 8 mL/min — ABNORMAL LOW (ref >60)
Glucose, Bld: 102 mg/dL — ABNORMAL HIGH (ref 70–99)
Potassium: 4 mmol/L (ref 3.5–5.1)
Sodium: 130 mmol/L — ABNORMAL LOW (ref 135–145)
Total Bilirubin: 1.4 mg/dL — ABNORMAL HIGH (ref 0.3–1.2)
Total Protein: 5.6 g/dL — ABNORMAL LOW (ref 6.5–8.1)

## 2021-10-13 LAB — RESP PANEL BY RT-PCR (FLU A&B, COVID) ARPGX2
Influenza A by PCR: NEGATIVE
Influenza B by PCR: NEGATIVE
SARS Coronavirus 2 by RT PCR: NEGATIVE

## 2021-10-13 LAB — HEPATITIS B SURFACE ANTIGEN: Hepatitis B Surface Ag: NONREACTIVE

## 2021-10-13 LAB — PHOSPHORUS: Phosphorus: 3.3 mg/dL (ref 2.5–4.6)

## 2021-10-13 LAB — C-REACTIVE PROTEIN: CRP: 0.6 mg/dL (ref <1.0)

## 2021-10-13 LAB — HIV ANTIBODY (ROUTINE TESTING W REFLEX): HIV Screen 4th Generation wRfx: NONREACTIVE

## 2021-10-13 LAB — PROCALCITONIN: Procalcitonin: 1.06 ng/mL

## 2021-10-13 LAB — MAGNESIUM: Magnesium: 1.5 mg/dL — ABNORMAL LOW (ref 1.7–2.4)

## 2021-10-13 MED ORDER — ATORVASTATIN CALCIUM 40 MG PO TABS
40.0000 mg | ORAL_TABLET | Freq: Every day | ORAL | Status: DC
  Administered 2021-10-13 – 2021-10-14 (×2): 40 mg via ORAL
  Filled 2021-10-13 (×2): qty 1

## 2021-10-13 MED ORDER — IPRATROPIUM-ALBUTEROL 0.5-2.5 (3) MG/3ML IN SOLN
3.0000 mL | RESPIRATORY_TRACT | Status: DC | PRN
  Administered 2021-10-13: 3 mL via RESPIRATORY_TRACT
  Filled 2021-10-13: qty 3

## 2021-10-13 MED ORDER — CHLORHEXIDINE GLUCONATE CLOTH 2 % EX PADS
6.0000 | MEDICATED_PAD | Freq: Every day | CUTANEOUS | Status: DC
Start: 2021-10-13 — End: 2021-10-13

## 2021-10-13 MED ORDER — NITROGLYCERIN 0.4 MG SL SUBL: 0.4000 mg | SUBLINGUAL_TABLET | SUBLINGUAL | Status: DC | PRN

## 2021-10-13 MED ORDER — POLYETHYLENE GLYCOL 3350 17 G PO PACK: 17.0000 g | PACK | Freq: Every day | ORAL | Status: DC | PRN

## 2021-10-13 MED ORDER — CHLORHEXIDINE GLUCONATE CLOTH 2 % EX PADS
6.0000 | MEDICATED_PAD | Freq: Every day | CUTANEOUS | Status: DC
  Administered 2021-10-13 – 2021-10-15 (×3): 6 via TOPICAL

## 2021-10-13 MED ORDER — CINACALCET HCL 30 MG PO TABS
30.0000 mg | ORAL_TABLET | Freq: Every day | ORAL | Status: DC
  Administered 2021-10-13 – 2021-10-14 (×2): 30 mg via ORAL
  Filled 2021-10-13 (×3): qty 1

## 2021-10-13 MED ORDER — HEPARIN SODIUM (PORCINE) 1000 UNIT/ML IJ SOLN
INTRAMUSCULAR | Status: AC
  Filled 2021-10-13: qty 2

## 2021-10-13 MED ORDER — FERRIC CITRATE 1 GM 210 MG(FE) PO TABS
210.0000 mg | ORAL_TABLET | Freq: Three times a day (TID) | ORAL | Status: DC
  Filled 2021-10-13 (×4): qty 1

## 2021-10-13 MED ORDER — HYDRALAZINE HCL 20 MG/ML IJ SOLN: 10.0000 mg | Freq: Four times a day (QID) | INTRAMUSCULAR | Status: DC | PRN

## 2021-10-13 MED ORDER — IPRATROPIUM-ALBUTEROL 0.5-2.5 (3) MG/3ML IN SOLN
3.0000 mL | Freq: Four times a day (QID) | RESPIRATORY_TRACT | Status: DC
  Administered 2021-10-13 (×2): 3 mL via RESPIRATORY_TRACT
  Filled 2021-10-13 (×2): qty 3

## 2021-10-13 MED ORDER — PANTOPRAZOLE SODIUM 40 MG PO TBEC
40.0000 mg | DELAYED_RELEASE_TABLET | Freq: Two times a day (BID) | ORAL | Status: DC
  Administered 2021-10-13 – 2021-10-15 (×5): 40 mg via ORAL
  Filled 2021-10-13 (×5): qty 1

## 2021-10-13 MED ORDER — SODIUM CHLORIDE 0.9% FLUSH
3.0000 mL | Freq: Two times a day (BID) | INTRAVENOUS | Status: DC
  Administered 2021-10-13 – 2021-10-15 (×4): 3 mL via INTRAVENOUS

## 2021-10-13 MED ORDER — ONDANSETRON HCL 4 MG PO TABS: 4.0000 mg | ORAL_TABLET | Freq: Four times a day (QID) | ORAL | Status: DC | PRN

## 2021-10-13 MED ORDER — ONDANSETRON HCL 4 MG/2ML IJ SOLN
4.0000 mg | Freq: Four times a day (QID) | INTRAMUSCULAR | Status: DC | PRN
  Administered 2021-10-13: 4 mg via INTRAVENOUS
  Filled 2021-10-13: qty 2

## 2021-10-13 MED ORDER — HEPARIN SODIUM (PORCINE) 5000 UNIT/ML IJ SOLN
5000.0000 [IU] | Freq: Three times a day (TID) | INTRAMUSCULAR | Status: DC
  Administered 2021-10-13 – 2021-10-15 (×5): 5000 [IU] via SUBCUTANEOUS
  Filled 2021-10-13 (×5): qty 1

## 2021-10-13 MED ORDER — ACETAMINOPHEN 650 MG RE SUPP: 650.0000 mg | Freq: Four times a day (QID) | RECTAL | Status: DC | PRN

## 2021-10-13 MED ORDER — ACETAMINOPHEN 325 MG PO TABS: 650.0000 mg | ORAL_TABLET | Freq: Four times a day (QID) | ORAL | Status: DC | PRN

## 2021-10-13 MED ORDER — CARVEDILOL 12.5 MG PO TABS
12.5000 mg | ORAL_TABLET | Freq: Two times a day (BID) | ORAL | Status: DC
  Administered 2021-10-13 – 2021-10-15 (×4): 12.5 mg via ORAL
  Filled 2021-10-13 (×4): qty 1

## 2021-10-13 NOTE — Assessment & Plan Note (Signed)
   Patient presenting with respiratory distress, patchy bilateral infiltrates on chest x-ray and evidence of diffuse pulmonary rales on examination  Clinical concern for mixed acute pulmonary edema superimposed on a mild COPD exacerbation  Placing patient on scheduled bronchodilator therapy, supplemental oxygen for bouts of hypoxia  Treating patient's hypertension with as needed intravenous hydralazine  If patient clinically worsens will initiate noninvasive positive pressure ventilation  Otherwise we will consult nephrology in the morning for consideration of hemodialysis  EDP was initially concerned for possible superimposed bacterial pneumonia however in the absence of fever or leukocytosis I believe this is unlikely.  We will hold off on additional antibiotics for now and obtain a CRP and procalcitonin to help rule this possibility out.

## 2021-10-13 NOTE — Assessment & Plan Note (Signed)
   Patient has had some significant improvement in shortness of breath with administration of several rounds of bronchodilator therapy making me think that there is a component of COPD exacerbation  We will place on scheduled bronchodilator therapy for now  Patient may benefit from a short course of systemic steroids if patient fails to clinically improve with dialysis  Ultimately, patient would benefit from some home-going inhalers as patient states that he does not have any

## 2021-10-13 NOTE — Progress Notes (Signed)
Care started prior to midnight in the emergency room and patient admitted early this morning after midnight by Dr. Inda Merlin and I am in current agreement with his assessment and plan.  Additional changes to the plan of care have been made accordingly.  The patient is a 61 year old chronically ill-appearing female with past medical history significant for but not limited to end-stage renal disease on hemodialysis Monday Wednesday Friday, history of polycystic kidney disease, anemia of chronic kidney disease, COPD, GERD, CAD status post stenting as well as history of chronic systolic and diastolic CHF with a EF last documented of 40% with grade 1 diastolic dysfunction who presented to the emergency department with shortness of breath.  Of note she was recently hospitalized from [facility] back in July to early August with a right tibial shaft fracture and underwent intramedullary narrowing and she was eventually discharged to inpatient rehab from 8/7 until 8/22.  She says approximate over the last few days she developed shortness of breath and it was moderate in severity with intensity and worse with exertion and improved with rest.  She also complained of some orthopnea and paroxysmal nocturnal dyspnea with increased bilateral lower extremity swelling.  Symptoms continue to worsen and she denies any cough or fevers and so she presents [facility].  Upon further evaluation in the ED she had chest x-ray done which showed patchy right lower lobe opacity concerning for pneumonia with additional superimposed concerns for possible pulmonary edema.  The EDP initiated IV ceftriaxone doxycycline and asked the hospitalist to admit for acute respiratory failure with hypoxia and given her concern for volume overload and need for dialysis the nephrologist was consulted.  Currently she is being admitted and treated for the following but not limited to:  Acute pulmonary edema [facility]) Patient presenting with  respiratory distress, patchy bilateral infiltrates on chest x-ray and evidence of diffuse pulmonary rales on examination Clinical concern for mixed acute pulmonary edema superimposed on a mild COPD exacerbation Placing patient on scheduled bronchodilator therapy, supplemental oxygen for bouts of hypoxia Treating patient's hypertension with as needed intravenous hydralazine If patient clinically worsens will initiate noninvasive positive pressure ventilation Otherwise we will consult nephrology in the morning for consideration of hemodialysis EDP was initially concerned for possible superimposed bacterial pneumonia however in the absence of fever or leukocytosis I believe this is unlikely.  We will hold off on additional antibiotics for now and obtain a CRP and procalcitonin.  CRP was 0.6 and procalcitonin level was 1.06 but not very reliable in the setting of end-stage renal disease Hold off any further antibiotics given that she is afebrile and has no leukocytosis   Acute on chronic combined systolic and diastolic CHF (congestive heart failure) (HCC) Some degree of acute diastolic distal heart failure likely contributing to patient's pulmonary edema and overall shortness of breath Due to patient's stage renal disease definitive treatment would be dialysis BNP was greater than 4500 Plan on consulting nephrology as noted above Otherwise monitoring on telemetry Strict input and output monitoring and daily weights She will need an amatory home O2 screen prior to discharge and need to continue to monitor for signs and symptoms of volume overload   COPD with acute exacerbation ([place]) Patient has had some significant improvement in shortness of breath with administration of several rounds of bronchodilator therapy making me think that there is a component of COPD exacerbation We will place on scheduled bronchodilator therapy for now SpO2: 96 % Patient may benefit from a short  course of systemic  steroids if patient fails to clinically improve with dialysis Ultimately, patient would benefit from some home-going inhalers as patient states that he does not have any She will need an amatory home O2 screen prior to discharge   ESRD on hemodialysis [facility]) Patient typically receives hemodialysis on Wednesday and Friday Consulted Nephrology in the morning for resumption of hemodialysis while hospitalized and Dr. Jonnie Finner is aware Monitoring renal function and electrolytes with serial chemistries Fluid restricted renal diet We will need to avoid further nephrotoxic medications, contrast dyes, hypotension and dehydration to ensure adequate renal perfusion Pateint's BUN/Cr went from 13/5.31 -> 14/5.63 Remainder of assessment and plan as above Continue to monitor and trend renal function carefully and repeat CMP in the a.m.   Coronary artery disease involving native coronary artery of native heart without angina pectoris Patient is currently chest pain free Monitoring patient on telemetry Continue home regimen of  lipid lowering therapy and AV nodal blocking therapy   GERD without esophagitis/GI Prophylaxis  Continuing home regimen of daily PPI therapy.  Anemia of Chronic Kidney Disease -Patient's hemoglobin/hematocrit is now 10.2/33.2 -Appears relatively stable -Check anemia panel in the a.m. Hypertension monitor for signs symptoms of bleeding; no overt bleeding noted -Repeat CBC in a.m.  Thrombocytopenia -The patient's platelet count has been on the lower side and has ranged from 103 -> 104 -Continue to monitor for signs and symptoms of bleeding; no overt bleeding noted -Repeat CBC in a.m.  Hyperbilirubinemia -Patient's T. bili is now 1.4 -Likely reactive and will need to monitor trend and repeat CMP in a.m.  Hyponatremia -Likely in the setting of hypervolemia and will need to be corrected in dialysis -Patient's sodium is now 130 -Continue monitor and trend and repeat CMP in  a.m.  Hypomagnesemia -We will defer to nephrology to replete -Patient's magnesium is now 1.5 -Likely to be corrected in the setting of dialysis -Continue monitor and trend and repeat magnesium level in a.m.   We will continue to monitor the patient's clinical response to intervention and repeat blood work and imaging in the a.m. and follow-up on nephrology recommendations

## 2021-10-13 NOTE — Assessment & Plan Note (Signed)
.   Patient is currently chest pain free . Monitoring patient on telemetry . Continue home regimen of  lipid lowering therapy and AV nodal blocking therapy

## 2021-10-13 NOTE — Consult Note (Signed)
Renal Service Consult Note [facility]  Kendra Clay 10/13/2021 Sol Blazing, MD Requesting Physician: Dr. Alfredia Ferguson  Reason for Consult: ESRD pt w/ SOB HPI: The patient is a 61 y.o. year-old w/ hx of ESRD on HD, HTN, PKD, recent fall w/ multiple fractures sp rehab who presented to ED for SOB last night. SOB worse lying flat, legs have been swelling lately as well. Has not missed HD. In ED CXR showing IS edema, BP's high to high-normal, HR 102. We are asked to see for dialysis.   Pt seen in ED room. Not in distress, but trouble breathing when she lies down. No other issues. Is walking some at home, uses WC and walker too when needed. L/w her son, takes one of the senior buses to dialysis. Very good HD compliance.   ROS - denies CP, no joint pain, no HA, no blurry vision, no rash, no diarrhea, no nausea/ vomiting, no dysuria, no difficulty voiding   Past Medical History  Past Medical History:  Diagnosis Date   Complication of anesthesia    Enlarged liver    secondary to PKD   ESRD on hemodialysis (HCC)    MWF [place]   Heart murmur    "slight" per ? Dr Deatra Ina 30 years ago. 2D ECHO  30 yearsa go.   History of blood transfusion    C- Section   History of bronchitis    numerous, last time> 1 year   Hypertension    Night muscle spasms    legs   Polycystic kidney disease    Genetic dx 23 years ago   PONV (postoperative nausea and vomiting)    patch helped   Scoliosis    Strabismus    Past Surgical History  Past Surgical History:  Procedure Laterality Date   ABDOMINAL HYSTERECTOMY     AV FISTULA PLACEMENT  09/01/2011   Procedure: ARTERIOVENOUS (AV) FISTULA CREATION;  Surgeon: Rosetta Posner, MD;  Location: [place];  Service: Vascular;  Laterality: Right;   [place]     for lazy eye   ORIF FEMUR FRACTURE Left 08/29/2021   Procedure: OPEN REDUCTION INTERNAL FIXATION (ORIF) DISTAL FEMUR FRACTURE;  Surgeon: Shona Needles, MD;   Location: [place];  Service: Orthopedics;  Laterality: Left;   ORIF WRIST FRACTURE Left 08/29/2021   Procedure: OPEN REDUCTION INTERNAL FIXATION (ORIF) WRIST FRACTURE;  Surgeon: Shona Needles, MD;  Location: [place];  Service: Orthopedics;  Laterality: Left;   TIBIA IM NAIL INSERTION Left 05/19/2018   Procedure: INTRAMEDULLARY (IM) NAIL TIBIAL;  Surgeon: Hiram Gash, MD;  Location: [place];  Service: Orthopedics;  Laterality: Left;   TIBIA IM NAIL INSERTION Right 08/29/2021   Procedure: INTRAMEDULLARY (IM) NAIL TIBIAL WITH IRRIGATION AND DEBRIDMENT;  Surgeon: Shona Needles, MD;  Location: [place];  Service: Orthopedics;  Laterality: Right;   Family History  Family History  Problem Relation Age of Onset   Arthritis Mother    Kidney disease Father    Polycystic kidney disease Son    Asthma Son    Hypertension Maternal Grandmother    Social History  reports that she quit smoking about 6 years ago. Her smoking use included cigarettes. She started smoking about 41 years ago. She has a 45.00 pack-year smoking history. She quit smokeless tobacco use about 7 years ago. She reports that she does not drink alcohol and does not use drugs. Allergies  Allergies  Allergen Reactions  E-Mycin [Erythromycin] Hives   Sulfa Antibiotics Rash    Other reaction(s): Skin Rash Other reaction(s): Skin Rash    Lisinopril Cough   Home medications Prior to Admission medications   Medication Sig Start Date End Date Taking? Authorizing Provider  acetaminophen (TYLENOL) 325 MG tablet Take 1-2 tablets (325-650 mg total) by mouth every 4 (four) hours as needed for mild pain. 09/24/21   Setzer, Edman Circle, PA-C  apixaban (ELIQUIS) 2.5 MG TABS tablet Take 1 tablet (2.5 mg total) by mouth 2 (two) times daily for 6 days. 09/24/21 09/30/21  Setzer, Edman Circle, PA-C  atorvastatin (LIPITOR) 40 MG tablet Take 40 mg by mouth at bedtime.    [provider]  cinacalcet (SENSIPAR) 30 MG tablet Take 30 mg by mouth daily with  supper.    [provider]  Darbepoetin Alfa (ARANESP) 150 MCG/0.3ML SOSY injection Inject 0.3 mLs (150 mcg total) into the vein every Wednesday with hemodialysis. 09/25/21   Setzer, Edman Circle, PA-C  dextromethorphan-guaiFENesin (MUCINEX DM) 30-600 MG 12hr tablet Take 2 tablets by mouth as needed for cough.    [provider]  doxercalciferol (HECTOROL) 4 MCG/2ML injection Inject 4 mLs (8 mcg total) into the vein every Monday, Wednesday, and Friday with hemodialysis. 09/25/21   Setzer, Edman Circle, PA-C  ferric citrate (AURYXIA) 1 GM 210 MG(Fe) tablet Take 210 mg by mouth 3 (three) times daily with meals.    [provider]  lidocaine (LIDODERM) 5 % Place 1 patch onto the skin daily. Remove & Discard patch within 12 hours or as directed by MD Patient not taking: Reported on 07/31/2020 04/26/20   Jonetta Osgood, MD  loperamide (IMODIUM A-D) 2 MG tablet Take 2 mg by mouth as needed for diarrhea or loose stools.    [provider]  loratadine (CLARITIN) 10 MG tablet Take 10 mg by mouth daily as needed for allergies.    [provider]  methocarbamol (ROBAXIN) 500 MG tablet Take 1 tablet (500 mg total) by mouth every 6 (six) hours as needed for muscle spasms. 09/24/21   Setzer, Edman Circle, PA-C  midodrine (PROAMATINE) 5 MG tablet Take 1 tablet (5 mg total) by mouth as needed (for hypotension during dialysis). 09/09/21   Mariel Aloe, MD  multivitamin (RENA-VIT) TABS tablet Take 1 tablet by mouth at bedtime. 09/24/21   Setzer, Edman Circle, PA-C  nitroGLYCERIN (NITROSTAT) 0.4 MG SL tablet Place 0.4 mg under the tongue every 5 (five) minutes as needed for chest pain.    [provider]  oxyCODONE (OXY IR/ROXICODONE) 5 MG immediate release tablet Take 1 tablet (5 mg total) by mouth every 8 (eight) hours as needed for severe pain. 09/24/21   Setzer, Edman Circle, PA-C  pantoprazole (PROTONIX) 40 MG tablet Take 40 mg by mouth 2 (two) times daily. 02/26/20   [provider]  polyethylene glycol powder (MIRALAX) powder Start taking 1 capful 3 times a day. Slowly cut back as needed until you have normal bowel movements. Patient taking differently: Take 17 g by mouth daily as needed for mild constipation. 08/17/17   Fatima Blank, MD  VENTOLIN HFA 108 [phone] Base) MCG/ACT inhaler INHALE 2 PUFFS INTO THE LUNGS EVERY 6 HOURS AS NEEDED Patient not taking: Reported on 08/31/2021 12/31/20   Spero Geralds, MD  Vitamin D, Ergocalciferol, (DRISDOL) 1.25 MG (50000 UNIT) CAPS capsule Take 1 capsule (50,000 Units total) by mouth every 7 (seven) days. 09/24/21   Setzer, Edman Circle, PA-C  Vitals:   10/13/21 0500 10/13/21 0700 10/13/21 0806 10/13/21 1021  BP: (!) 169/83 (!) 140/85    Pulse: 94 96    Resp: (!) 25 (!) 21    Temp:   97.8 F (36.6 C) 98.2 F (36.8 C)  TempSrc:   Oral Oral  SpO2: 96% 94%    Weight:      Height:       Exam Gen alert, no distress No rash, cyanosis or gangrene Sclera anicteric, throat clear  No jvd or bruits Chest clear bilat to bases, no rales/ wheezing RRR no MRG Abd soft ntnd no mass or ascites +bs GU defer MS no joint effusions or deformity Ext 1-2+ bilat pretib/ pedal edema Neuro is alert, Ox 3 , nf    RFA AVF+bruit    OP HD: [place] MWF  3h 52min  350/1.5   2/2 bath  P2  Hep 1200  RFA AVF   Assessment/ Plan: Vol overload/ pulm edema - w/ orthopnea, recent worsening LE edema. Prob is losing body wt. Plan HD today and tomorrow.  Recent Fall w/ multiple fx's - s/p ORIF L femur Fx and L radius/ulnar Fx, + IM nailing of R tibial Fx on 7/27. SP CIR. Recovering well.  Recent COVID infection - during admit for #2, sp course of molnupiravir  ESRD - on HD MWF. Will have extra HD today, and plan HD again tomorrow  HTN - BP's stable, cont any home meds Anemia of ESRD - Hb 10-11 here, no esa needs today. 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 Will add on phos, cont any binders      Kendra Dolorez Jeffrey   MD 10/13/2021, 10:56 AM Recent Labs  Lab 10/12/21 2157 10/12/21 2204 10/13/21 0550  HGB 10.8* 11.6* 10.2*  ALBUMIN  --   --  2.3*  CALCIUM 8.5*  --  8.4*  CREATININE 5.31*  --  5.63*  K 3.8 3.8 4.0

## 2021-10-13 NOTE — Procedures (Signed)
   I was present at this dialysis session, have reviewed the session itself and made  appropriate changes Kelly Splinter MD [place] pager [phone]   10/13/2021, 3:38 PM

## 2021-10-13 NOTE — ED Notes (Signed)
Pt called out saying the neb treatment only helped for a little. Respirations 25, O2 95% RA. This RN placed pt on 2L [place] for comfort. RT notified.

## 2021-10-13 NOTE — Assessment & Plan Note (Signed)
   Patient typically receives hemodialysis on Wednesday and Friday  Consulting nephrology in the morning for resumption of hemodialysis while hospitalized  Monitoring renal function and electrolytes with serial chemistries  Fluid restricted renal diet  Remainder of assessment and plan as above

## 2021-10-13 NOTE — Assessment & Plan Note (Signed)
   Some degree of acute diastolic distal heart failure likely contributing to patient's pulmonary edema and overall shortness of breath  Due to patient's stage renal disease definitive treatment would be dialysis  Plan on consulting nephrology as noted above  Otherwise monitoring on telemetry  Strict input and output monitoring

## 2021-10-13 NOTE — H&P (Signed)
History and Physical    Patient: Kendra Clay MRN: [id number] DOA: 10/12/2021  Date of Service: the patient was seen and examined on 10/13/2021  Patient coming from: Home  Chief Complaint:  Chief Complaint  Patient presents with   Shortness of Breath    HPI:   61 year old female with past medical history of end-stage renal disease (HD MWF), polycystic kidney disease, anemia of chronic disease, COPD,, gastroesophageal reflux disease, coronary artery disease status post stent placement as well as systolic and diastolic congestive heart failure (Echo 2017 EF 40% with G1DD) presenting to [facility] emergency department with complaints of shortness of breath.  Of note, patient was recently hospitalized at [facility] from 7/27 until 8/7 with a right tibial shaft fracture and underwent intramedullary nailing.  Patient was eventually discharged to inpatient rehab from 8/7 until 8/22.  Over approximately the past 2 to 3 days the patient has developed shortness of breath.  Shortness of breath is moderate to severe in intensity, worse with exertion and improved with rest.  Patient also complains of orthopnea and paroxysmal nocturnal dyspnea as well as increasing bilateral lower extremity swelling.  Patient denies associated cough, fever recent travel or sick contacts.  Patient's symptoms continue to worsen until she eventually presented to [facility] emergency department.  Upon evaluation in the emergency department chest imaging revealed patchy right lower lobe opacity concerning for pneumonia with additional superimposed concerns for possible pulmonary edema.  EDP initiated intravenous ceftriaxone and doxycycline and requested hospitalization for consideration of dialysis for superimposed pulmonary edema.  The hospitalist group was then called to assess the patient for admission in the hospital.  Review of Systems: Review of Systems  Respiratory:  Positive for shortness  of breath.   Cardiovascular:  Positive for orthopnea, leg swelling and PND.  All other systems reviewed and are negative.    Past Medical History:  Diagnosis Date   Complication of anesthesia    Enlarged liver    secondary to PKD   ESRD on hemodialysis ([place])    MWF [place]   Heart murmur    "slight" per ? Dr Deatra Ina 30 years ago. 2D ECHO  30 yearsa go.   History of blood transfusion    C- Section   History of bronchitis    numerous, last time> 1 year   Hypertension    Night muscle spasms    legs   Polycystic kidney disease    Genetic dx 23 years ago   PONV (postoperative nausea and vomiting)    patch helped   Scoliosis    Strabismus     Past Surgical History:  Procedure Laterality Date   ABDOMINAL HYSTERECTOMY     AV FISTULA PLACEMENT  09/01/2011   Procedure: ARTERIOVENOUS (AV) FISTULA CREATION;  Surgeon: Rosetta Posner, MD;  Location: [place];  Service: Vascular;  Laterality: Right;   [place]     for lazy eye   ORIF FEMUR FRACTURE Left 08/29/2021   Procedure: OPEN REDUCTION INTERNAL FIXATION (ORIF) DISTAL FEMUR FRACTURE;  Surgeon: Shona Needles, MD;  Location: [place];  Service: Orthopedics;  Laterality: Left;   ORIF WRIST FRACTURE Left 08/29/2021   Procedure: OPEN REDUCTION INTERNAL FIXATION (ORIF) WRIST FRACTURE;  Surgeon: Shona Needles, MD;  Location: [place];  Service: Orthopedics;  Laterality: Left;   TIBIA IM NAIL INSERTION Left 05/19/2018   Procedure: INTRAMEDULLARY (IM) NAIL TIBIAL;  Surgeon: Hiram Gash, MD;  Location:  [place] [facility];  Service: Orthopedics;  Laterality: Left;   TIBIA IM NAIL INSERTION Right 08/29/2021   Procedure: INTRAMEDULLARY (IM) NAIL TIBIAL WITH IRRIGATION AND DEBRIDMENT;  Surgeon: Shona Needles, MD;  Location: [place];  Service: Orthopedics;  Laterality: Right;    Social History:  reports that she quit smoking about 6 years ago. Her smoking use included cigarettes. She started smoking about 41 years ago. She has a 45.00  pack-year smoking history. She quit smokeless tobacco use about 7 years ago. She reports that she does not drink alcohol and does not use drugs.  Allergies  Allergen Reactions   E-Mycin [Erythromycin] Hives   Sulfa Antibiotics Rash    Other reaction(s): Skin Rash Other reaction(s): Skin Rash    Lisinopril Cough    Family History  Problem Relation Age of Onset   Arthritis Mother    Kidney disease Father    Polycystic kidney disease Son    Asthma Son    Hypertension Maternal Grandmother     Prior to Admission medications   Medication Sig Start Date End Date Taking? Authorizing Provider  acetaminophen (TYLENOL) 325 MG tablet Take 1-2 tablets (325-650 mg total) by mouth every 4 (four) hours as needed for mild pain. 09/24/21   Setzer, Edman Circle, PA-C  apixaban (ELIQUIS) 2.5 MG TABS tablet Take 1 tablet (2.5 mg total) by mouth 2 (two) times daily for 6 days. 09/24/21 09/30/21  Setzer, Edman Circle, PA-C  atorvastatin (LIPITOR) 40 MG tablet Take 40 mg by mouth at bedtime.    [provider]  cinacalcet (SENSIPAR) 30 MG tablet Take 30 mg by mouth daily with supper.    [provider]  Darbepoetin Alfa (ARANESP) 150 MCG/0.3ML SOSY injection Inject 0.3 mLs (150 mcg total) into the vein every Wednesday with hemodialysis. 09/25/21   Setzer, Edman Circle, PA-C  dextromethorphan-guaiFENesin (MUCINEX DM) 30-600 MG 12hr tablet Take 2 tablets by mouth as needed for cough.    [provider]  doxercalciferol (HECTOROL) 4 MCG/2ML injection Inject 4 mLs (8 mcg total) into the vein every Monday, Wednesday, and Friday with hemodialysis. 09/25/21   Setzer, Edman Circle, PA-C  ferric citrate (AURYXIA) 1 GM 210 MG(Fe) tablet Take 210 mg by mouth 3 (three) times daily with meals.    [provider]  lidocaine (LIDODERM) 5 % Place 1 patch onto the skin daily. Remove & Discard patch within 12 hours or as directed by MD Patient not taking: Reported on 07/31/2020 04/26/20   Jonetta Osgood, MD   loperamide (IMODIUM A-D) 2 MG tablet Take 2 mg by mouth as needed for diarrhea or loose stools.    [provider]  loratadine (CLARITIN) 10 MG tablet Take 10 mg by mouth daily as needed for allergies.    [provider]  methocarbamol (ROBAXIN) 500 MG tablet Take 1 tablet (500 mg total) by mouth every 6 (six) hours as needed for muscle spasms. 09/24/21   Setzer, Edman Circle, PA-C  midodrine (PROAMATINE) 5 MG tablet Take 1 tablet (5 mg total) by mouth as needed (for hypotension during dialysis). 09/09/21   Mariel Aloe, MD  multivitamin (RENA-VIT) TABS tablet Take 1 tablet by mouth at bedtime. 09/24/21   Setzer, Edman Circle, PA-C  nitroGLYCERIN (NITROSTAT) 0.4 MG SL tablet Place 0.4 mg under the tongue every 5 (five) minutes as needed for chest pain.    [provider]  oxyCODONE (OXY IR/ROXICODONE) 5 MG immediate release tablet Take 1 tablet (5 mg total) by mouth every  8 (eight) hours as needed for severe pain. 09/24/21   Setzer, Edman Circle, PA-C  pantoprazole (PROTONIX) 40 MG tablet Take 40 mg by mouth 2 (two) times daily. 02/26/20   [provider]  polyethylene glycol powder (MIRALAX) powder Start taking 1 capful 3 times a day. Slowly cut back as needed until you have normal bowel movements. Patient taking differently: Take 17 g by mouth daily as needed for mild constipation. 08/17/17   Fatima Blank, MD  VENTOLIN HFA 108 [phone] Base) MCG/ACT inhaler INHALE 2 PUFFS INTO THE LUNGS EVERY 6 HOURS AS NEEDED Patient not taking: Reported on 08/31/2021 12/31/20   Spero Geralds, MD  Vitamin D, Ergocalciferol, (DRISDOL) 1.25 MG (50000 UNIT) CAPS capsule Take 1 capsule (50,000 Units total) by mouth every 7 (seven) days. 09/24/21   Barbie Banner, PA-C    Physical Exam:  Vitals:   10/13/21 0415 10/13/21 0445 10/13/21 0500 10/13/21 0806  BP: (!) 182/99 (!) 177/99 (!) 169/83   Pulse: 94 (!) 40 94   Resp: (!) 30  (!) 25   Temp:    97.8 F (36.6 C)  TempSrc:    Oral   SpO2: 95% 96% 96%   Weight:      Height:        Constitutional: Awake alert and oriented x3, patient is in mild respiratory distress Skin: no rashes, no lesions, good skin turgor noted. Eyes: Pupils are equally reactive to light.  No evidence of scleral icterus or conjunctival pallor.  ENMT: Moist mucous membranes noted.  Posterior pharynx clear of any exudate or lesions.   Neck: normal, supple, no masses, no thyromegaly.  No evidence of jugular venous distension.   Respiratory: Notable rales in the bilateral mid and lower fields.  Intermittent mild expiratory wheezing noted.  Normal respiratory effort. No accessory muscle use.  Cardiovascular: Regular rate and rhythm, no murmurs / rubs / gallops.  Bilateral lower extremity pitting edema that tracks from the feet all the way up to the knees.  2+ pedal pulses. No carotid bruits.  Chest:   Nontender without crepitus or deformity.   Back:   Nontender without crepitus or deformity. Abdomen: Abdomen is soft and nontender.  No evidence of intra-abdominal masses.  Positive bowel sounds noted in all quadrants.   Musculoskeletal: No joint deformity upper and lower extremities. Good ROM, no contractures. Normal muscle tone.  Neurologic: CN 2-12 grossly intact. Sensation intact.  Patient moving all 4 extremities spontaneously.  Patient is following all commands.  Patient is responsive to verbal stimuli.   Psychiatric: Patient exhibits normal mood with appropriate affect.  Patient seems to possess insight as to their current situation.    Data Reviewed:  I have personally reviewed and interpreted labs, imaging.  Significant findings are   BNP: Rater than 4500 VBG revealing pH 7.511, PCO2 of 34.6, PO2 of 28  Lab Results  Component Value Date   WBC 4.4 10/13/2021   HGB 10.2 (L) 10/13/2021   HCT 33.2 (L) 10/13/2021   MCV 99.4 10/13/2021   PLT 104 (L) 10/13/2021   Lab Results  Component Value Date   K 4.0 10/13/2021   Lab Results   Component Value Date   BUN 14 10/13/2021   Lab Results  Component Value Date   CREATININE 5.63 (H) 10/13/2021    CXR:   Chest X-ray was personally reviewed.  Patchy right lower lobe infiltrate concerning for pneumonia with additional patchy infiltrates in the bilateral bases.  No evidence of  pneumothorax.    EKG: Personally reviewed.  Rhythm is normal sinus rhythm with heart rate of 86 bpm.  Evidence of right bundle branch block.  No dynamic ST segment changes appreciated.   Assessment and Plan: * Acute pulmonary edema ([place]) Patient presenting with respiratory distress, patchy bilateral infiltrates on chest x-ray and evidence of diffuse pulmonary rales on examination Clinical concern for mixed acute pulmonary edema superimposed on a mild COPD exacerbation Placing patient on scheduled bronchodilator therapy, supplemental oxygen for bouts of hypoxia Treating patient's hypertension with as needed intravenous hydralazine If patient clinically worsens will initiate noninvasive positive pressure ventilation Otherwise we will consult nephrology in the morning for consideration of hemodialysis EDP was initially concerned for possible superimposed bacterial pneumonia however in the absence of fever or leukocytosis I believe this is unlikely.  We will hold off on additional antibiotics for now and obtain a CRP and procalcitonin to help rule this possibility out.  Acute on chronic combined systolic and diastolic CHF (congestive heart failure) (HCC) Some degree of acute diastolic distal heart failure likely contributing to patient's pulmonary edema and overall shortness of breath Due to patient's stage renal disease definitive treatment would be dialysis Plan on consulting nephrology as noted above Otherwise monitoring on telemetry Strict input and output monitoring  COPD with acute exacerbation ([place]) Patient has had some significant improvement in shortness of breath with administration of  several rounds of bronchodilator therapy making me think that there is a component of COPD exacerbation We will place on scheduled bronchodilator therapy for now Patient may benefit from a short course of systemic steroids if patient fails to clinically improve with dialysis Ultimately, patient would benefit from some home-going inhalers as patient states that he does not have any  ESRD on hemodialysis [facility]) Patient typically receives hemodialysis on Wednesday and Friday Consulting nephrology in the morning for resumption of hemodialysis while hospitalized Monitoring renal function and electrolytes with serial chemistries Fluid restricted renal diet Remainder of assessment and plan as above  Coronary artery disease involving native coronary artery of native heart without angina pectoris Patient is currently chest pain free Monitoring patient on telemetry Continue home regimen of  lipid lowering therapy and AV nodal blocking therapy   GERD without esophagitis Continuing home regimen of daily PPI therapy.        Code Status:  Full code  code status decision has been confirmed with: patient Family Communication: deferred   Consults: Dr. Jonnie Finner with Nephrology  Severity of Illness:  The appropriate patient status for this patient is OBSERVATION. Observation status is judged to be reasonable and necessary in order to provide the required intensity of service to ensure the patient's safety. The patient's presenting symptoms, physical exam findings, and initial radiographic and laboratory data in the context of their medical condition is felt to place them at decreased risk for further clinical deterioration. Furthermore, it is anticipated that the patient will be medically stable for discharge from the hospital within 2 midnights of admission.   Author:  Vernelle Emerald MD  10/13/2021 8:51 AM

## 2021-10-13 NOTE — Assessment & Plan Note (Signed)
Continuing home regimen of daily PPI therapy.  

## 2021-10-14 ENCOUNTER — Inpatient Hospital Stay (HOSPITAL_COMMUNITY): Payer: Medicare Other

## 2021-10-14 DIAGNOSIS — I5043 Acute on chronic combined systolic (congestive) and diastolic (congestive) heart failure: Secondary | ICD-10-CM | POA: Diagnosis not present

## 2021-10-14 DIAGNOSIS — I2510 Atherosclerotic heart disease of native coronary artery without angina pectoris: Secondary | ICD-10-CM | POA: Diagnosis not present

## 2021-10-14 DIAGNOSIS — L8990 Pressure ulcer of unspecified site, unspecified stage: Secondary | ICD-10-CM | POA: Insufficient documentation

## 2021-10-14 DIAGNOSIS — J810 Acute pulmonary edema: Secondary | ICD-10-CM | POA: Diagnosis not present

## 2021-10-14 DIAGNOSIS — J441 Chronic obstructive pulmonary disease with (acute) exacerbation: Secondary | ICD-10-CM | POA: Diagnosis not present

## 2021-10-14 LAB — COMPREHENSIVE METABOLIC PANEL
ALT: 11 U/L (ref 0–44)
AST: 18 U/L (ref 15–41)
Albumin: 1.9 g/dL — ABNORMAL LOW (ref 3.5–5.0)
Alkaline Phosphatase: 215 U/L — ABNORMAL HIGH (ref 38–126)
Anion gap: 6 (ref 5–15)
BUN: 8 mg/dL (ref 6–20)
CO2: 30 mmol/L (ref 22–32)
Calcium: 7.4 mg/dL — ABNORMAL LOW (ref 8.9–10.3)
Chloride: 96 mmol/L — ABNORMAL LOW (ref 98–111)
Creatinine, Ser: 3.85 mg/dL — ABNORMAL HIGH (ref 0.44–1.00)
GFR, Estimated: 13 mL/min — ABNORMAL LOW (ref >60)
Glucose, Bld: 99 mg/dL (ref 70–99)
Potassium: 4 mmol/L (ref 3.5–5.1)
Sodium: 132 mmol/L — ABNORMAL LOW (ref 135–145)
Total Bilirubin: 1.1 mg/dL (ref 0.3–1.2)
Total Protein: 5.4 g/dL — ABNORMAL LOW (ref 6.5–8.1)

## 2021-10-14 LAB — CBC WITH DIFFERENTIAL/PLATELET
Abs Immature Granulocytes: 0.01 10*3/uL (ref 0.00–0.07)
Basophils Absolute: 0 10*3/uL (ref 0.0–0.1)
Basophils Relative: 2 %
Eosinophils Absolute: 0.1 10*3/uL (ref 0.0–0.5)
Eosinophils Relative: 3 %
HCT: 28 % — ABNORMAL LOW (ref 36.0–46.0)
Hemoglobin: 8.8 g/dL — ABNORMAL LOW (ref 12.0–15.0)
Immature Granulocytes: 0 %
Lymphocytes Relative: 35 %
Lymphs Abs: 0.9 10*3/uL (ref 0.7–4.0)
MCH: 30.7 pg (ref 26.0–34.0)
MCHC: 31.4 g/dL (ref 30.0–36.0)
MCV: 97.6 fL (ref 80.0–100.0)
Monocytes Absolute: 0.3 10*3/uL (ref 0.1–1.0)
Monocytes Relative: 12 %
Neutro Abs: 1.3 10*3/uL — ABNORMAL LOW (ref 1.7–7.7)
Neutrophils Relative %: 48 %
Platelets: 97 10*3/uL — ABNORMAL LOW (ref 150–400)
RBC: 2.87 MIL/uL — ABNORMAL LOW (ref 3.87–5.11)
RDW: 17.8 % — ABNORMAL HIGH (ref 11.5–15.5)
WBC: 2.7 10*3/uL — ABNORMAL LOW (ref 4.0–10.5)
nRBC: 0 % (ref 0.0–0.2)

## 2021-10-14 LAB — RETICULOCYTES
Immature Retic Fract: 10.6 % (ref 2.3–15.9)
RBC.: 2.83 MIL/uL — ABNORMAL LOW (ref 3.87–5.11)
Retic Count, Absolute: 46.1 10*3/uL (ref 19.0–186.0)
Retic Ct Pct: 1.6 % (ref 0.4–3.1)

## 2021-10-14 LAB — FERRITIN: Ferritin: 2504 ng/mL — ABNORMAL HIGH (ref 11–307)

## 2021-10-14 LAB — VITAMIN B12: Vitamin B-12: 433 pg/mL (ref 180–914)

## 2021-10-14 LAB — IRON AND TIBC
Iron: 85 ug/dL (ref 28–170)
Saturation Ratios: 86 % — ABNORMAL HIGH (ref 10.4–31.8)
TIBC: 99 ug/dL — ABNORMAL LOW (ref 250–450)
UIBC: 14 ug/dL

## 2021-10-14 LAB — MAGNESIUM: Magnesium: 1.6 mg/dL — ABNORMAL LOW (ref 1.7–2.4)

## 2021-10-14 LAB — PHOSPHORUS: Phosphorus: 2.5 mg/dL (ref 2.5–4.6)

## 2021-10-14 LAB — FOLATE: Folate: 4.5 ng/mL — ABNORMAL LOW (ref >5.9)

## 2021-10-14 MED ORDER — MUPIROCIN CALCIUM 2 % EX CREA
TOPICAL_CREAM | Freq: Every day | CUTANEOUS | Status: DC
Start: 2021-10-14 — End: 2021-10-15
  Filled 2021-10-14: qty 15

## 2021-10-14 MED ORDER — IPRATROPIUM-ALBUTEROL 0.5-2.5 (3) MG/3ML IN SOLN
3.0000 mL | Freq: Three times a day (TID) | RESPIRATORY_TRACT | Status: DC
  Administered 2021-10-14: 3 mL via RESPIRATORY_TRACT
  Filled 2021-10-14: qty 3

## 2021-10-14 MED ORDER — GUAIFENESIN ER 600 MG PO TB12
1200.0000 mg | ORAL_TABLET | Freq: Two times a day (BID) | ORAL | Status: DC
  Administered 2021-10-14 – 2021-10-15 (×3): 1200 mg via ORAL
  Filled 2021-10-14 (×3): qty 2

## 2021-10-14 NOTE — Progress Notes (Signed)
Received patient in bed to unit.  Alert and oriented.  Informed consent signed and in chart.   Treatment initiated: 1530 Treatment completed: 1800  Patient tolerated well. Ended tx early d/t clotting system. Transported back to the room  Alert, without acute distress.  Hand-off given to patient's nurse.   Access used: [email or web address] Access issues: none  Total UF removed: 2.2L Medication(s) given: none Post HD VS: 140/77,100%,84,18 Post HD weight: 58.7kg   [facility]

## 2021-10-14 NOTE — Progress Notes (Signed)
Admit: 10/12/2021 LOS: 1  69F ESRD on iHD MWF [place] with AHRF/pulm edema/vol overload  Subjective:  HD yesterday 3L UF, feels much improved this AM Edema improving 1L [place] this AM, nl SpO2 K 4.0, BUN 8 Hb 8.8 TSAT 86%  09/10 0701 - 09/11 0700 In: -  Out: 3000   Filed Weights   10/13/21 1735 10/13/21 1855 10/14/21 0442  Weight: 59.4 kg 58.7 kg 59.4 kg    Scheduled Meds:  atorvastatin  40 mg Oral QHS   carvedilol  12.5 mg Oral BID WC   Chlorhexidine Gluconate Cloth  6 each Topical Q0600   cinacalcet  30 mg Oral Q supper   ferric citrate  210 mg Oral TID WC   heparin  5,000 Units Subcutaneous Q8H   pantoprazole  40 mg Oral BID   sodium chloride flush  3 mL Intravenous Q12H   Continuous Infusions: PRN Meds:.acetaminophen **OR** acetaminophen, hydrALAZINE, ipratropium-albuterol, nitroGLYCERIN, ondansetron **OR** ondansetron (ZOFRAN) IV, polyethylene glycol  Current Labs: reviewed   OP HD: [place] MWF  3h 71min  350/1.5   2/2 bath  P2  Hep 1200  RFA AVF  Physical Exam:  Blood pressure 133/72, pulse 89, temperature 97.9 F (36.6 C), temperature source Oral, resp. rate 20, height 5\' 3"  (1.6 m), weight 59.4 kg, SpO2 93 %. NAD, conversant in full sentences RRR CTAB Trace LEE, L>R RUE AVF + B/T Nonfocal S/nt/nd  A ESRD MWF RUE AVF Vol overload / Pulm Edema / AHRF: Improving but stil req O2 Recent fall with L femur and L radius/ulna Fx s/p ORIF, s/p CIR Recetn COVID infection HTN: BPs stable Anemia: Hb 10.2 to 8.8, TSAT high no IV Fe, CTM CKD-BMD: P and Ca ok.    P HD today on schedule: 2-3L UF; then MWF Medication Issues; Preferred narcotic agents for pain control are hydromorphone, fentanyl, and methadone. Morphine should not be used.  Baclofen should be avoided Avoid oral sodium phosphate and magnesium citrate based laxatives / bowel preps    Pearson Grippe MD 10/14/2021, 9:50 AM  Recent Labs  Lab 10/12/21 2157 10/12/21 2204 10/13/21 0550 10/13/21 1125  10/14/21 0423  NA 132* 131* 130*  --  132*  K 3.8 3.8 4.0  --  4.0  CL 95*  --  93*  --  96*  CO2 25  --  23  --  30  GLUCOSE 95  --  102*  --  99  BUN 13  --  14  --  8  CREATININE 5.31*  --  5.63*  --  3.85*  CALCIUM 8.5*  --  8.4*  --  7.4*  PHOS  --   --   --  3.3 2.5   Recent Labs  Lab 10/12/21 2157 10/12/21 2204 10/13/21 0550 10/14/21 0423  WBC 4.1  --  4.4 2.7*  NEUTROABS 2.4  --  2.8 1.3*  HGB 10.8* 11.6* 10.2* 8.8*  HCT 34.7* 34.0* 33.2* 28.0*  MCV 97.7  --  99.4 97.6  PLT 103*  --  104* 97*

## 2021-10-14 NOTE — Consult Note (Signed)
[name] Nurse Consult Note: Reason for Consult: Consult requested for right calf wound.  Pt states she fell 2 months ago and had a fractured leg and hematoma to this site, which has greatly improved.  Wound type: Right anterior calf with full thickness wound covered by moist loose eschar, removes easily when moistened and rubbed, revealing 50% eschar remaining, 50% red and moist, 1X1X.3cm, no odor, drainage, or fluctuance. 2 pieces of unattached suture material also removed.  Dressing procedure/placement/frequency: Topical treatment orders provided for bedside nurses to perform as follows to provide antimicrobial benefits and promote moist healing: Apply Bactroban to right calf wound Q day, then cover with foam dressing.  (Change foam dressing Q 3 days or PRN soiling.) Please re-consult if further assistance is needed.  Thank-you,  Julien Girt MSN, [place], [place], [place], [place]

## 2021-10-14 NOTE — Progress Notes (Signed)
PROGRESS NOTE    Kendra Clay  ZOX:[id number] DOB: 04/10/1960 DOA: 10/12/2021 PCP: Patient, [name]   Brief Narrative:  The patient is a 61 year old chronically ill-appearing female with past medical history significant for but not limited to end-stage renal disease on hemodialysis Monday Wednesday Friday, history of polycystic kidney disease, anemia of chronic kidney disease, COPD, GERD, CAD status post stenting as well as history of chronic systolic and diastolic CHF with a EF last documented of 40% with grade 1 diastolic dysfunction who presented to the emergency department with shortness of breath.  Of note she was recently hospitalized from [facility] back in July to early August with a right tibial shaft fracture and underwent intramedullary narrowing and she was eventually discharged to inpatient rehab from 8/7 until 8/22.  She says approximate over the last few days she developed shortness of breath and it was moderate in severity with intensity and worse with exertion and improved with rest.  She also complained of some orthopnea and paroxysmal nocturnal dyspnea with increased bilateral lower extremity swelling.  Symptoms continue to worsen and she denies any cough or fevers and so she presents [facility].  Upon further evaluation in the ED she had chest x-ray done which showed patchy right lower lobe opacity concerning for pneumonia with additional superimposed concerns for possible pulmonary edema.  The EDP initiated IV ceftriaxone doxycycline and asked the hospitalist to admit for acute respiratory failure with hypoxia and given her concern for volume overload and need for dialysis the nephrologist was consulted.  She went to dialysis yesterday and nephrology evaluated again today and will be taking her back to dialysis.  She is improving and oxygen is weaning.  Patient still feels a little congested so we have ordered an incentive slumber and flutter valve as well as guaifenesin.   Will need an amatory home O2 screen prior to discharge and nephrology clearance.  Assessment and Plan: Acute pulmonary edema [facility]) Patient presenting with respiratory distress, patchy bilateral infiltrates on chest x-ray and evidence of diffuse pulmonary rales on examination Clinical concern for mixed acute pulmonary edema superimposed on a mild COPD exacerbation Placing patient on scheduled bronchodilator therapy, supplemental oxygen for bouts of hypoxia Treating patient's hypertension with as needed intravenous hydralazine If patient clinically worsens will initiate noninvasive positive pressure ventilation Nephrology has been consulted and recommending dialysis for secondary neuro EDP was initially concerned for possible superimposed bacterial pneumonia however in the absence of fever or leukocytosis I believe this is unlikely.  We will hold off on additional antibiotics for now and obtain a CRP and procalcitonin.  CRP was 0.6 and procalcitonin level was 1.06 but not very reliable in the setting of end-stage renal disease Hold off any further antibiotics given that she is afebrile and has no leukocytosis See below and repeat chest x-ray in a.m.   Acute on chronic combined systolic and diastolic CHF (congestive heart failure) (HCC) Some degree of acute diastolic distal heart failure likely contributing to patient's pulmonary edema and overall shortness of breath Due to patient's stage renal disease definitive treatment would be dialysis BNP was greater than 4500; now weight is down 8 pounds since admission Plan on consulting nephrology as noted above Otherwise monitoring on telemetry Strict input and output monitoring and daily weights; she is -2.697 liters since admission and will undergo another dialysis session today Chest x-ray today showed "Perihilar vascular congestion and mild interstitial edema. Small pleural effusions with patchy haziness in the lower lung fields  consistent with  atelectasis, pneumonia or edema. The opacities are less dense than previously." She will need an ambulatory home O2 screen prior to discharge and need to continue to monitor for signs and symptoms of volume overload   COPD with acute exacerbation [facility]) Patient has had some significant improvement in shortness of breath with administration of several rounds of bronchodilator therapy making me think that there is a component of COPD exacerbation We will place on scheduled bronchodilator therapy for now SpO2: 95 % O2 Flow Rate (L/min): 1 L/min FiO2 (%): 100 %; continue to wean supplemental oxygen We will add guaifenesin 1200 was p.o. twice daily, flutter valve and incentive spirometry Patient may benefit from a short course of systemic steroids if patient fails to clinically improve with dialysis Ultimately, patient would benefit from some home-going inhalers as patient states that he does not have any She will need an an ambulatory home O2 screen prior to discharge   ESRD on hemodialysis [facility]) Patient typically receives hemodialysis on Wednesday and Friday via right upper extremity AV fistula Consulted Nephrology in the morning for resumption of hemodialysis while hospitalized and Dr. Jonnie Finner is aware Monitoring renal function and electrolytes with serial chemistries; she underwent hemodialysis yesterday and will undergo hemodialysis again today given that she still appears a little volume overloaded -In yesterday's dialysis session she had 3 L of ultrafiltration and feels much better and edema is improving.  She remains on 1 L and potassium was 4.0 Per discussion with Dr. Joelyn Oms she is on the schedule for today for 2 to 3 L of ultrafiltration and then transitioning back to Monday Wednesday Friday dialysis sessions Fluid restricted renal diet We will need to avoid further nephrotoxic medications, contrast dyes, hypotension and dehydration to ensure adequate renal perfusion Pateint's BUN/Cr went  from 13/5.31 -> 14/5.63 -> 8/3.85 Remainder of assessment and plan as above Continue to monitor and trend renal function carefully and repeat CMP in the a.m. She had nephrology evaluation and assistance   Coronary artery disease involving native coronary artery of native heart without angina pectoris -Patient is currently chest pain free -Monitoring patient on telemetry -Continue home regimen of  lipid lowering therapy and AV nodal blocking therapy   GERD without esophagitis/GI Prophylaxis  -Continuing home regimen of PPI therapy with pantoprazole 40 mg p.o. twice daily   Anemia of Chronic Kidney Disease -Patient's hemoglobin/hematocrit is now 10.2/33.2 -> 8.8/28.0 -Appears relatively stable -Check Anemia Panel and iron level is now 85, TIBC is 14, TIBC 99, saturation 96%, ferritin level is now 2504, folate level is 4.5, vitamin B12 433 Hypertension monitor for signs symptoms of bleeding; no overt bleeding noted -Repeat CBC in a.m.   Thrombocytopenia -The patient's platelet count has been on the lower side and has ranged from 103 -> 104 -> 97 -Continue to monitor for signs and symptoms of bleeding; no overt bleeding noted -Repeat CBC in a.m.   Hyperbilirubinemia -Patient's T. bili has trended down and gone from 1.4 -> 1.1 -Likely reactive and will need to monitor trend and repeat CMP in a.m.   Hyponatremia -Likely in the setting of hypervolemia and will need to be corrected in dialysis -Patient's sodium is now 130 -> 132 -Continue monitor and trend and repeat CMP in a.m.   Hypomagnesemia -We will defer to nephrology to replete -Patient's magnesium has gone from 1.5 -> 1.6 -Likely to be corrected in the setting of dialysis -Continue monitor and trend and repeat magnesium level in a.m.  Right leg wound -  Has a right anterior calf wound -WOC nurse consulted and topical treatment orders have been provided for the bedside nurses to perform and they are recommending applying  Bactroban to the right calf wound daily and covering with a foam dressing  DVT prophylaxis: heparin injection 5,000 Units Start: 10/13/21 0600    Code Status: Full Code Family Communication: No family currently at bedside  Disposition Plan:  Level of care: Telemetry Medical Status is: Inpatient Remains inpatient appropriate because: Needs further evaluation and clearance by nephrology as she is getting dialyzed again today   Consultants:  Nephrology  Procedures:  Dialysis with ultrafiltration  Antimicrobials:  Anti-infectives (From admission, onward)    Start     Dose/Rate Route Frequency Ordered Stop   10/12/21 2245  cefTRIAXone (ROCEPHIN) 1 g in sodium chloride 0.9 % 100 mL IVPB        1 g 200 mL/hr over 30 Minutes Intravenous  Once 10/12/21 2244 10/13/21 0035   10/12/21 2245  doxycycline (VIBRAMYCIN) 100 mg in sodium chloride 0.9 % 250 mL IVPB        100 mg 125 mL/hr over 120 Minutes Intravenous Once 10/12/21 2244 10/13/21 0308        Subjective: And examined at bedside and states that she is feeling much better today than she did yesterday.  Still on 1 L supplemental oxygen.  Understands that she will be undergoing dialysis again today.  Asking for something to help expectorate her cough and something for her congestion.  Chest x-ray still shows some interstitial edema.  She is doing well otherwise and thinks that her legs have improved.  No other concerns or complaints at this time.  Objective: Vitals:   10/14/21 0038 10/14/21 0442 10/14/21 0820 10/14/21 1044  BP: 122/66 123/67 133/72 137/81  Pulse: 92 91 89 84  Resp: 19 15 20 17   Temp: 98.5 F (36.9 C) 98 F (36.7 C) 97.9 F (36.6 C) 98.1 F (36.7 C)  TempSrc: Oral Oral Oral Oral  SpO2: 90% 94% 93% 95%  Weight:  59.4 kg    Height:        Intake/Output Summary (Last 24 hours) at 10/14/2021 1415 Last data filed at 10/14/2021 1319 Gross per 24 hour  Intake 303 ml  Output 3000 ml  Net -2697 ml   Filed  Weights   10/13/21 1735 10/13/21 1855 10/14/21 0442  Weight: 59.4 kg 58.7 kg 59.4 kg   Examination: Physical Exam:  Constitutional: WN/WD Caucasian female in no acute distress Respiratory: Diminished to auscultation bilaterally with coarse breath sounds and some crackles, no wheezing, rales, rhonchi. Normal respiratory effort and patient is not tachypenic. No accessory muscle use.  Unlabored breathing but she is wearing 1 L supplemental oxygen via nasal cannula Cardiovascular: RRR, no murmurs / rubs / gallops. S1 and S2 auscultated.  Has mild 1+ extremity edema Abdomen: Soft, non-tender, nondistended. Bowel sounds positive.  GU: Deferred. Musculoskeletal: No clubbing / cyanosis of digits/nails. No joint deformity upper and lower extremities has a right upper arm extremity fistula and a right leg wound Skin: Right leg wound is covered with a Mepilex pad Neurologic: CN 2-12 grossly intact with no focal deficits. Romberg sign and cerebellar reflexes not assessed.  Psychiatric: Normal judgment and insight. Alert and oriented x 3. Normal mood and appropriate affect.   Data Reviewed: I have personally reviewed following labs and imaging studies  CBC: Recent Labs  Lab 10/12/21 2157 10/12/21 2204 10/13/21 0550 10/14/21 0423  WBC 4.1  --  4.4 2.7*  NEUTROABS 2.4  --  2.8 1.3*  HGB 10.8* 11.6* 10.2* 8.8*  HCT 34.7* 34.0* 33.2* 28.0*  MCV 97.7  --  99.4 97.6  PLT 103*  --  104* 97*   Basic Metabolic Panel: Recent Labs  Lab 10/12/21 2157 10/12/21 2204 10/13/21 0550 10/13/21 1125 10/14/21 0423  NA 132* 131* 130*  --  132*  K 3.8 3.8 4.0  --  4.0  CL 95*  --  93*  --  96*  CO2 25  --  23  --  30  GLUCOSE 95  --  102*  --  99  BUN 13  --  14  --  8  CREATININE 5.31*  --  5.63*  --  3.85*  CALCIUM 8.5*  --  8.4*  --  7.4*  MG  --   --  1.5*  --  1.6*  PHOS  --   --   --  3.3 2.5   GFR: Estimated Creatinine Clearance: 12.9 mL/min (A) (by C-G formula based on SCr of 3.85 mg/dL  (H)). Liver Function Tests: Recent Labs  Lab 10/13/21 0550 10/14/21 0423  AST 23 18  ALT 13 11  ALKPHOS 247* 215*  BILITOT 1.4* 1.1  PROT 5.6* 5.4*  ALBUMIN 2.3* 1.9*   No results for input(s): "LIPASE", "AMYLASE" in the last 168 hours. No results for input(s): "AMMONIA" in the last 168 hours. Coagulation Profile: No results for input(s): "INR", "PROTIME" in the last 168 hours. Cardiac Enzymes: No results for input(s): "CKTOTAL", "CKMB", "CKMBINDEX", "TROPONINI" in the last 168 hours. BNP (last 3 results) No results for input(s): "PROBNP" in the last 8760 hours. HbA1C: No results for input(s): "HGBA1C" in the last 72 hours. CBG: No results for input(s): "GLUCAP" in the last 168 hours. Lipid Profile: No results for input(s): "CHOL", "HDL", "LDLCALC", "TRIG", "CHOLHDL", "LDLDIRECT" in the last 72 hours. Thyroid Function Tests: No results for input(s): "TSH", "T4TOTAL", "FREET4", "T3FREE", "THYROIDAB" in the last 72 hours. Anemia Panel: Recent Labs    10/14/21 0423  VITAMINB12 433  FOLATE 4.5*  FERRITIN 2,504*  TIBC 99*  IRON 85  RETICCTPCT 1.6   Sepsis Labs: Recent Labs  Lab 10/13/21 0550  PROCALCITON 1.06    Recent Results (from the past 240 hour(s))  Resp Panel by RT-PCR (Flu A&B, Covid) Anterior Nasal Swab     Status: None   Collection Time: 10/12/21 11:50 PM   Specimen: Anterior Nasal Swab  Result Value Ref Range Status   SARS Coronavirus 2 by RT PCR NEGATIVE NEGATIVE Final    Comment: (NOTE) SARS-CoV-2 target nucleic acids are NOT DETECTED.  The SARS-CoV-2 RNA is generally detectable in upper respiratory specimens during the acute phase of infection. The lowest concentration of SARS-CoV-2 viral copies this assay can detect is 138 copies/mL. A negative result does not preclude SARS-Cov-2 infection and should not be used as the sole basis for treatment or other patient management decisions. A negative result may occur with  improper specimen  collection/handling, submission of specimen other than nasopharyngeal swab, presence of viral mutation(s) within the areas targeted by this assay, and inadequate number of viral copies(<138 copies/mL). A negative result must be combined with clinical observations, patient history, and epidemiological information. The expected result is Negative.  Fact Sheet for Patients:  EntrepreneurPulse.com.au  Fact Sheet for Healthcare Providers:  IncredibleEmployment.be  This test is no t yet approved or cleared by the [place] FDA and  has been authorized for detection and/or  diagnosis of SARS-CoV-2 by FDA under an Emergency Use Authorization (EUA). This EUA will remain  in effect (meaning this test can be used) for the duration of the COVID-19 declaration under Section 564(b)(1) of the Act, 21 U.S.C.section 360bbb-3(b)(1), unless the authorization is terminated  or revoked sooner.       Influenza A by PCR NEGATIVE NEGATIVE Final   Influenza B by PCR NEGATIVE NEGATIVE Final    Comment: (NOTE) The Xpert Xpress SARS-CoV-2/FLU/RSV plus assay is intended as an aid in the diagnosis of influenza from Nasopharyngeal swab specimens and should not be used as a sole basis for treatment. Nasal washings and aspirates are unacceptable for Xpert Xpress SARS-CoV-2/FLU/RSV testing.  Fact Sheet for Patients: EntrepreneurPulse.com.au  Fact Sheet for Healthcare Providers: IncredibleEmployment.be  This test is not yet approved or cleared by the [place] FDA and has been authorized for detection and/or diagnosis of SARS-CoV-2 by FDA under an Emergency Use Authorization (EUA). This EUA will remain in effect (meaning this test can be used) for the duration of the COVID-19 declaration under Section 564(b)(1) of the Act, 21 U.S.C. section 360bbb-3(b)(1), unless the authorization is terminated or revoked.  Performed at [facility], [place] [address], [place], [place] [address]   Culture, blood (Routine X 2) w Reflex to ID Panel     Status: None (Preliminary result)   Collection Time: 10/13/21  5:11 AM   Specimen: BLOOD  Result Value Ref Range Status   Specimen Description BLOOD LEFT ANTECUBITAL  Final   Special Requests   Final    BOTTLES DRAWN AEROBIC ONLY Blood Culture results may not be optimal due to an inadequate volume of blood received in culture bottles   Culture   Final    NO GROWTH 1 DAY Performed at [facility], [place] [address] [place], [place] [address]    Report Status PENDING  Incomplete    Radiology Studies: DG CHEST PORT 1 VIEW  Result Date: 10/14/2021 CLINICAL DATA:  [id number], shortness of breath on exertion with end-stage renal failure. EXAM: PORTABLE CHEST 1 VIEW COMPARISON:  AP Lat 10/12/2021 FINDINGS: Stable cardiomegaly. There is mild perihilar vascular congestion, mild generalized interstitial edema and small pleural effusions. There is patchy haziness in the lower lung fields which could be due to atelectasis, pneumonia or ground-glass edema. The opacities are less dense than previously. There is a stable mediastinum with mild aortic atherosclerosis and tortuosity. No other focal lung infiltrate is seen. There is upper thoracic levoscoliosis. IMPRESSION: 1. Perihilar vascular congestion and mild interstitial edema. 2. Small pleural effusions with patchy haziness in the lower lung fields consistent with atelectasis, pneumonia or edema. The opacities are less dense than previously. Electronically Signed   By: Telford Nab M.D.   On: 10/14/2021 05:36   DG Chest 2 View  Result Date: 10/12/2021 CLINICAL DATA:  Shortness of breath EXAM: CHEST - 2 VIEW COMPARISON:  08/22/2021 FINDINGS: Mild patchy right lower lobe opacity, suspicious for pneumonia. Increased interstitial markings, raising the possibility of superimposed interstitial edema. No definite pleural effusions. Cardiomegaly.  Mild degenerative changes of the visualized thoracolumbar spine. IMPRESSION: Mild patchy right lower lobe opacity, suspicious for pneumonia. Cardiomegaly with possible mild superimposed interstitial edema. Electronically Signed   By: Julian Hy M.D.   On: 10/12/2021 22:30    Scheduled Meds:  atorvastatin  40 mg Oral QHS   carvedilol  12.5 mg Oral BID WC   Chlorhexidine Gluconate Cloth  6 each Topical Q0600  cinacalcet  30 mg Oral Q supper   ferric citrate  210 mg Oral TID WC   guaiFENesin  1,200 mg Oral BID   heparin  5,000 Units Subcutaneous Q8H   mupirocin cream   Topical Daily   pantoprazole  40 mg Oral BID   sodium chloride flush  3 mL Intravenous Q12H   Continuous Infusions:   LOS: 1 day   Raiford Noble, DO [facility] Available via Epic secure chat 7am-7pm After these hours, please refer to coverage provider listed on [email or web address] 10/14/2021, 2:15 PM

## 2021-10-15 ENCOUNTER — Inpatient Hospital Stay (HOSPITAL_COMMUNITY): Payer: Medicare Other

## 2021-10-15 DIAGNOSIS — I5043 Acute on chronic combined systolic (congestive) and diastolic (congestive) heart failure: Secondary | ICD-10-CM | POA: Diagnosis not present

## 2021-10-15 DIAGNOSIS — L8990 Pressure ulcer of unspecified site, unspecified stage: Secondary | ICD-10-CM

## 2021-10-15 DIAGNOSIS — D638 Anemia in other chronic diseases classified elsewhere: Secondary | ICD-10-CM

## 2021-10-15 DIAGNOSIS — J810 Acute pulmonary edema: Secondary | ICD-10-CM | POA: Diagnosis not present

## 2021-10-15 DIAGNOSIS — E871 Hypo-osmolality and hyponatremia: Secondary | ICD-10-CM

## 2021-10-15 DIAGNOSIS — D696 Thrombocytopenia, unspecified: Secondary | ICD-10-CM

## 2021-10-15 DIAGNOSIS — J441 Chronic obstructive pulmonary disease with (acute) exacerbation: Secondary | ICD-10-CM | POA: Diagnosis not present

## 2021-10-15 DIAGNOSIS — S81801A Unspecified open wound, right lower leg, initial encounter: Secondary | ICD-10-CM

## 2021-10-15 LAB — HEPATITIS B SURFACE ANTIBODY, QUANTITATIVE: Hep B S AB Quant (Post): 37.8 m[IU]/mL (ref >9.9)

## 2021-10-15 LAB — CBC WITH DIFFERENTIAL/PLATELET
Abs Immature Granulocytes: 0.01 10*3/uL (ref 0.00–0.07)
Basophils Absolute: 0.1 10*3/uL (ref 0.0–0.1)
Basophils Relative: 2 %
Eosinophils Absolute: 0.1 10*3/uL (ref 0.0–0.5)
Eosinophils Relative: 3 %
HCT: 27.8 % — ABNORMAL LOW (ref 36.0–46.0)
Hemoglobin: 8.6 g/dL — ABNORMAL LOW (ref 12.0–15.0)
Immature Granulocytes: 0 %
Lymphocytes Relative: 35 %
Lymphs Abs: 1 10*3/uL (ref 0.7–4.0)
MCH: 31 pg (ref 26.0–34.0)
MCHC: 30.9 g/dL (ref 30.0–36.0)
MCV: 100.4 fL — ABNORMAL HIGH (ref 80.0–100.0)
Monocytes Absolute: 0.3 10*3/uL (ref 0.1–1.0)
Monocytes Relative: 12 %
Neutro Abs: 1.4 10*3/uL — ABNORMAL LOW (ref 1.7–7.7)
Neutrophils Relative %: 48 %
Platelets: 81 10*3/uL — ABNORMAL LOW (ref 150–400)
RBC: 2.77 MIL/uL — ABNORMAL LOW (ref 3.87–5.11)
RDW: 17.3 % — ABNORMAL HIGH (ref 11.5–15.5)
WBC: 2.8 10*3/uL — ABNORMAL LOW (ref 4.0–10.5)
nRBC: 0 % (ref 0.0–0.2)

## 2021-10-15 LAB — PHOSPHORUS: Phosphorus: 1.8 mg/dL — ABNORMAL LOW (ref 2.5–4.6)

## 2021-10-15 LAB — COMPREHENSIVE METABOLIC PANEL
ALT: 12 U/L (ref 0–44)
AST: 19 U/L (ref 15–41)
Albumin: 2.1 g/dL — ABNORMAL LOW (ref 3.5–5.0)
Alkaline Phosphatase: 208 U/L — ABNORMAL HIGH (ref 38–126)
Anion gap: 6 (ref 5–15)
BUN: 9 mg/dL (ref 6–20)
CO2: 27 mmol/L (ref 22–32)
Calcium: 7.4 mg/dL — ABNORMAL LOW (ref 8.9–10.3)
Chloride: 99 mmol/L (ref 98–111)
Creatinine, Ser: 3.85 mg/dL — ABNORMAL HIGH (ref 0.44–1.00)
GFR, Estimated: 13 mL/min — ABNORMAL LOW (ref >60)
Glucose, Bld: 102 mg/dL — ABNORMAL HIGH (ref 70–99)
Potassium: 3.7 mmol/L (ref 3.5–5.1)
Sodium: 132 mmol/L — ABNORMAL LOW (ref 135–145)
Total Bilirubin: 1.4 mg/dL — ABNORMAL HIGH (ref 0.3–1.2)
Total Protein: 5.4 g/dL — ABNORMAL LOW (ref 6.5–8.1)

## 2021-10-15 LAB — HEPATITIS C ANTIBODY: HCV Ab: NONREACTIVE — AB

## 2021-10-15 LAB — MAGNESIUM: Magnesium: 1.3 mg/dL — ABNORMAL LOW (ref 1.7–2.4)

## 2021-10-15 LAB — HEPATITIS B SURFACE ANTIBODY,QUALITATIVE

## 2021-10-15 MED ORDER — MAGNESIUM SULFATE 2 GM/50ML IV SOLN
2.0000 g | Freq: Once | INTRAVENOUS | Status: AC
  Administered 2021-10-15: 2 g via INTRAVENOUS
  Filled 2021-10-15: qty 50

## 2021-10-15 MED ORDER — ONDANSETRON HCL 4 MG PO TABS: 4.0000 mg | ORAL_TABLET | Freq: Four times a day (QID) | ORAL | 0 refills | Status: DC | PRN

## 2021-10-15 MED ORDER — MUPIROCIN 2 % EX OINT: TOPICAL_OINTMENT | Freq: Every day | CUTANEOUS | 0 refills | Status: DC

## 2021-10-15 NOTE — Progress Notes (Signed)
Admit: 10/12/2021 LOS: 2  61F ESRD on iHD MWF [place] with AHRF/pulm edema/vol overload  Subjective:  HD yesterday 2.2L UF, feels much improved this AM [place] O2 this AM Hb 8.6 TSAT 86%  09/11 0701 - 09/12 0700 In: 723 [P.O.:720; I.V.:3] Out: 2.2   Filed Weights   10/13/21 1855 10/14/21 0442 10/15/21 0300  Weight: 58.7 kg 59.4 kg 58.1 kg    Scheduled Meds:  atorvastatin  40 mg Oral QHS   carvedilol  12.5 mg Oral BID WC   Chlorhexidine Gluconate Cloth  6 each Topical Q0600   cinacalcet  30 mg Oral Q supper   ferric citrate  210 mg Oral TID WC   guaiFENesin  1,200 mg Oral BID   heparin  5,000 Units Subcutaneous Q8H   mupirocin cream   Topical Daily   pantoprazole  40 mg Oral BID   sodium chloride flush  3 mL Intravenous Q12H   Continuous Infusions:  magnesium sulfate bolus IVPB 2 g (10/15/21 0943)   PRN Meds:.acetaminophen **OR** acetaminophen, hydrALAZINE, ipratropium-albuterol, nitroGLYCERIN, ondansetron **OR** ondansetron (ZOFRAN) IV, polyethylene glycol  Current Labs: reviewed   OP HD:  MWF  3h 62min  350/1.5   2/2 bath  P2  Hep 1200  RFA AVF  Physical Exam:  Blood pressure (!) 156/83, pulse 85, temperature (!) 97.5 F (36.4 C), temperature source Oral, resp. rate 16, height 5\' 3"  (1.6 m), weight 58.1 kg, SpO2 99 %. NAD, conversant in full sentences RRR CTAB Trace LEE, L>R RUE AVF + B/T Nonfocal S/nt/nd  A ESRD MWF RUE AVF Vol overload / Pulm Edema / AHRF: Improving but stil req O2 Recent fall with L femur and L radius/ulna Fx s/p ORIF, s/p CIR Recetn COVID infection HTN: BPs stable Anemia: Hb stable 8.6, TSAT high no IV Fe, CTM CKD-BMD: P and Ca ok.    P HD tomorrow if here: 2-3L UF; 3K, 2-3L UF, AVF Medication Issues; Preferred narcotic agents for pain control are hydromorphone, fentanyl, and methadone. Morphine should not be used.  Baclofen should be avoided Avoid oral sodium phosphate and magnesium citrate based laxatives / bowel preps     Pearson Grippe MD 10/15/2021, 9:51 AM  Recent Labs  Lab 10/13/21 0550 10/13/21 1125 10/14/21 0423 10/15/21 0507  NA 130*  --  132* 132*  K 4.0  --  4.0 3.7  CL 93*  --  96* 99  CO2 23  --  30 27  GLUCOSE 102*  --  99 102*  BUN 14  --  8 9  CREATININE 5.63*  --  3.85* 3.85*  CALCIUM 8.4*  --  7.4* 7.4*  PHOS  --  3.3 2.5 1.8*    Recent Labs  Lab 10/13/21 0550 10/14/21 0423 10/15/21 0507  WBC 4.4 2.7* 2.8*  NEUTROABS 2.8 1.3* 1.4*  HGB 10.2* 8.8* 8.6*  HCT 33.2* 28.0* 27.8*  MCV 99.4 97.6 100.4*  PLT 104* 97* 81*

## 2021-10-15 NOTE — Progress Notes (Signed)
D/C order noted. Contacted [place] to advise clinic of pt's d/c today and that pt will resume care tomorrow.   Melven Sartorius Renal Navigator [phone]

## 2021-10-15 NOTE — TOC Progression Note (Signed)
Transition of Care [facility]) - Progression Note    Patient Details  Name: Kendra Clay MRN: [id number] Date of Birth: [birth year]  Transition of [facility]) CM/SW Contact  Zenon Mayo, RN Phone Number: 10/15/2021, 12:18 PM  Clinical Narrative:    She is active with [facility] for [place], [place] and [facility].        Expected Discharge Plan and Services                                                 Social Determinants of Health (SDOH) Interventions    Readmission Risk Interventions     No data to display

## 2021-10-15 NOTE — Evaluation (Signed)
Occupational Therapy Evaluation Patient Details Name: Kendra Clay MRN: [id number] DOB: 09-Feb-1960 Today's Date: 10/15/2021   History of Present Illness Pt is a 61 y/o F presenting to ED on 9/9 with SOB, BLE edema, was admitted for acute pulmonary edema. PMH includes recent hospitalization 7/27-8/7 with AIR stay for R tibial shaft fx s/p IM nail, L femur fx s/p ORIF, and L wrist ORIF 08/2021. Pt wit hESRD on HD MWF, HTN, scoliosis, and strabismus.   Clinical Impression   Pt lives at home with son who assists with ADLs as needed and reports using w/c at L platform RW, walking short household distances at home. Pt with good recall of precautions, able to perform ADLs with min-mod A, min guard for bed mobility, and min guard for transfers with L platform RW. SpO2 93% on RA with  ambulation, 95% at rest. Pt presenting with impairments listed below, will follow acutely. Recommend HHOT at d/c.     Recommendations for follow up therapy are one component of a multi-disciplinary discharge planning process, led by the attending physician.  Recommendations may be updated based on patient status, additional functional criteria and insurance authorization.   Follow Up Recommendations  Home health OT    Assistance Recommended at Discharge Intermittent Supervision/Assistance  Patient can return home with the following A little help with walking and/or transfers;A little help with bathing/dressing/bathroom;Assistance with cooking/housework;Assist for transportation;Help with stairs or ramp for entrance    Functional Status Assessment  Patient has had a recent decline in their functional status and demonstrates the ability to make significant improvements in function in a reasonable and predictable amount of time.  Equipment Recommendations  None recommended by OT (pt has all needed DME)    Recommendations for Other Services PT consult     Precautions / Restrictions Precautions Precautions:  Fall Other Brace: pt reports cam boot has been d/c'd; she does not wear cam boot at home on RLE when walking at home x2 weeks, does not have it in hospital Restrictions Weight Bearing Restrictions: Yes LUE Weight Bearing: Weight bear through elbow only RLE Weight Bearing: Weight bearing as tolerated LLE Weight Bearing: Weight bearing as tolerated Other Position/Activity Restrictions: per pt has not had to have boot on RLE and has been mobilizing at home without boot      Mobility Bed Mobility Overal bed mobility: Needs Assistance Bed Mobility: Supine to Sit     Supine to sit: Min guard     General bed mobility comments: adheres well to NWB precautions    Transfers Overall transfer level: Needs assistance Equipment used: Left platform walker Transfers: Sit to/from Stand Sit to Stand: Min guard                  Balance Overall balance assessment: Needs assistance Sitting-balance support: Feet supported Sitting balance-Leahy Scale: Fair     Standing balance support: During functional activity, Reliant on assistive device for balance Standing balance-Leahy Scale: Poor                             ADL either performed or assessed with clinical judgement   ADL Overall ADL's : Needs assistance/impaired Eating/Feeding: Modified independent   Grooming: Modified independent   Upper Body Bathing: Minimal assistance   Lower Body Bathing: Moderate assistance   Upper Body Dressing : Minimal assistance   Lower Body Dressing: Moderate assistance   Toilet Transfer: Min guard;Rolling walker (2 wheels);BSC/3in1;Ambulation   Toileting-  Clothing Manipulation and Hygiene: Minimal assistance       Functional mobility during ADLs: Min guard (L platform RW)       Vision Baseline Vision/History: 1 Wears glasses Additional Comments: strabismus at baseline     Perception     Praxis      Pertinent Vitals/Pain Pain Assessment Pain Assessment: Faces Pain  Score: 2  Faces Pain Scale: Hurts a little bit Pain Location: bil knees with mobility Pain Descriptors / Indicators: Discomfort Pain Intervention(s): Limited activity within patient's tolerance, Monitored during session, Repositioned     Hand Dominance Right   Extremity/Trunk Assessment Upper Extremity Assessment Upper Extremity Assessment: Generalized weakness;LUE deficits/detail LUE Deficits / Details: L wrist s/p ORIF, immobilized   Lower Extremity Assessment Lower Extremity Assessment: Defer to PT evaluation   Cervical / Trunk Assessment Cervical / Trunk Assessment: Normal   Communication Communication Communication: No difficulties   Cognition Arousal/Alertness: Awake/alert Behavior During Therapy: WFL for tasks assessed/performed Overall Cognitive Status: Within Functional Limits for tasks assessed                                       General Comments  SpO2 93% on RA during short distance ambulation, 95% at rest on RA    Exercises     Shoulder Instructions      Home Living Family/patient expects to be discharged to:: Private residence Living Arrangements: Children Available Help at Discharge: Family;Available 24 hours/day Type of Home: House Home Access: Stairs to enter;Ramped entrance Entrance Stairs-Number of Steps: 2 Entrance Stairs-Rails: None Home Layout: One level     Bathroom Shower/Tub: Teacher, early years/pre: Standard Bathroom Accessibility: Yes   Home Equipment: Cane - single point;Rolling Walker (2 wheels);Rollator (4 wheels);Shower seat;Wheelchair - manual;Hospital bed;BSC/3in1          Prior Functioning/Environment Prior Level of Function : Independent/Modified Independent             Mobility Comments: reports using w/c and platform RW; walking short household distances at home          OT Problem List: Decreased strength;Decreased range of motion;Decreased activity tolerance;Impaired balance  (sitting and/or standing);Impaired UE functional use;Decreased safety awareness;Decreased coordination;Decreased knowledge of use of DME or AE;Cardiopulmonary status limiting activity      OT Treatment/Interventions: Self-care/ADL training;Therapeutic exercise;DME and/or AE instruction;Energy conservation;Therapeutic activities;Patient/family education;Balance training    OT Goals(Current goals can be found in the care plan section) Acute Rehab OT Goals Patient Stated Goal: none stated OT Goal Formulation: With patient Time For Goal Achievement: 10/29/21 Potential to Achieve Goals: Good ADL Goals Pt Will Perform Upper Body Dressing: with modified independence;sitting Pt Will Perform Lower Body Dressing: with min assist;sit to/from stand;sitting/lateral leans Pt Will Transfer to Toilet: with modified independence;regular height toilet;ambulating Additional ADL Goal #1: pt will perform bed mobility mod I in prep for ADLs  OT Frequency: Min 2X/week    Co-evaluation              AM-PAC OT "6 Clicks" Daily Activity     Outcome Measure Help from another person eating meals?: None Help from another person taking care of personal grooming?: None Help from another person toileting, which includes using toliet, bedpan, or urinal?: A Little Help from another person bathing (including washing, rinsing, drying)?: A Lot Help from another person to put on and taking off regular upper body clothing?: A Little Help from another  person to put on and taking off regular lower body clothing?: A Lot 6 Click Score: 18   End of Session Equipment Utilized During Treatment: Gait belt;Other (comment) (L platform RW) Nurse Communication: Mobility status  Activity Tolerance: Patient tolerated treatment well Patient left: in chair;with call bell/phone within reach  OT Visit Diagnosis: Unsteadiness on feet (R26.81);Other abnormalities of gait and mobility (R26.89);Muscle weakness (generalized)  (M62.81);History of falling (Z91.81)                Time: 9355-2174 OT Time Calculation (min): 36 min Charges:  OT General Charges $OT Visit: 1 Visit OT Evaluation $OT Eval Moderate Complexity: 1 Mod OT Treatments $Self Care/Home Management : 8-22 mins  Lynnda Child, OTD, OTR/L Acute Rehab [phone]) [phone] - [place] 10/15/2021, 11:35 AM

## 2021-10-15 NOTE — TOC Transition Note (Signed)
Transition of Care [facility]) - CM/SW Discharge Note   Patient Details  Name: Kendra Clay MRN: [id number] Date of Birth: 07/14/1960  Transition of Care [facility]) CM/SW Contact:  Zenon Mayo, RN Phone Number: 10/15/2021, 2:06 PM   Clinical Narrative:    Patient is for dc today,  NCM offered choice, she states she has [facility] and would like  to stay with them .  she is set up with [facility] for [place], [place], [place].  NCM notified Tommi Rumps with [facility] that she is for dc today.  Soc will begin 24 to 48 hrs post dc.     Final next level of care: [place] Barriers to Discharge: No Barriers Identified   Patient Goals and CMS Choice Patient states their goals for this hospitalization and ongoing recovery are:: return home CMS Medicare.gov Compare Post Acute Care list provided to:: Patient Choice offered to / list presented to : Patient  Discharge Placement                       Discharge Plan and Services                  DME Agency: NA       HH Arranged: PT, OT, Nurse's Aide [place] Agency: [place] Date [facility] Agency Contacted: 10/15/21 Time [place]: 1406 Representative spoke with at [place]: Tommi Rumps  Social Determinants of Health ([place]) Interventions     Readmission Risk Interventions     No data to display

## 2021-10-15 NOTE — TOC Initial Note (Signed)
Transition of Care [facility]) - Initial/Assessment Note    Patient Details  Name: Kendra Clay MRN: [id number] Date of Birth: Jul 22, 1960  [facility]) CM/SW Contact:    Zenon Mayo, RN Phone Number: 10/15/2021, 4:30 PM  Clinical Narrative:                 Patient is for dc today,  NCM offered choice, she states she has [facility] and would like  to stay with them .  she is set up with [facility] for [place], [place], [place].  NCM notified Tommi Rumps with [facility] that she is for dc today.  Soc will begin 24 to 48 hrs post dc.      Expected Discharge Plan: [place] Barriers to Discharge: No Barriers Identified   Patient Goals and CMS Choice Patient states their goals for this hospitalization and ongoing recovery are:: return home CMS Medicare.gov Compare Post Acute Care list provided to:: Patient Represenative (must comment) Choice offered to / list presented to : Patient  Expected Discharge Plan and Services Expected Discharge Plan: [place] In-house Referral: NA Discharge Planning Services: CM Consult Post Acute Care Choice: [place] arrangements for the past 2 months: Single Family Home Expected Discharge Date: 10/15/21                 DME Agency: NA       HH Arranged: PT, OT, Nurse's Aide HH Agency: [place] Date [facility] Agency Contacted: 10/15/21 Time HH Agency Contacted: 55 Representative spoke with at [place]: Tommi Rumps  Prior Living Arrangements/Services Living arrangements for the past 2 months: [place] Lives with:: Spouse Patient language and need for interpreter reviewed:: Yes Do you feel safe going back to the place where you live?: Yes      Need for Family Participation in Patient Care: Yes (Comment) Care giver support system in place?: Yes (comment) Current home services: Home OT, Home PT, Homehealth aide Criminal Activity/Legal Involvement Pertinent to Current Situation/Hospitalization:  No - Comment as needed  Activities of Daily Living      Permission Sought/Granted                  Emotional Assessment Appearance:: Appears stated age Attitude/Demeanor/Rapport: Engaged Affect (typically observed): Appropriate Orientation: : Oriented to Self, Oriented to Place, Oriented to  Time, Oriented to Situation Alcohol / Substance Use: Not Applicable Psych Involvement: No (comment)  Admission diagnosis:  Acute pulmonary edema (HCC) [J81.0] Pneumonia due to infectious organism, unspecified laterality, unspecified part of lung [J18.9] Acute on chronic congestive heart failure, unspecified heart failure type ([place]) [I50.9] Patient Active Problem List   Diagnosis Date Noted   Pressure injury of skin 10/14/2021   Acute on chronic combined systolic and diastolic CHF (congestive heart failure) ([place]) 10/13/2021   GERD without esophagitis 10/13/2021   Coronary artery disease involving native coronary artery of native heart without angina pectoris 10/13/2021   Multiple fractures of both lower extremities and ribs 09/09/2021   Pancytopenia ([place]) 08/30/2021   Essential hypertension 08/30/2021   Closed fracture of left distal femur ([place]) 08/29/2021   Fracture of shaft of right tibia and fibula, open type I or II, initial encounter 08/29/2021   Closed fracture of left distal radius 08/29/2021   Ground-level fall 08/29/2021   COVID-19 04/25/2020   Hypertensive urgency 04/25/2020   COPD with acute exacerbation ([place]) 04/25/2020   HLD (hyperlipidemia) 04/25/2020   Pelvic fracture ([place]) 04/25/2020  Pubic ramus fracture (HCC) 04/24/2020   History of anemia due to CKD 05/19/2018   Chronic HFrEF (heart failure with reduced ejection fraction) ([place]) 05/19/2018   Tibia/fibula fracture, left, closed, initial encounter 05/18/2018   Acute pulmonary edema ([place]) 08/14/2015   Acute respiratory failure with hypoxia ([place]) 08/14/2015   Normocytic normochromic anemia 08/14/2015   Polycystic  kidney disease 07/17/2013   ESRD on hemodialysis ([place]) 08/26/2011   PCP:  Patient, [name] Pharmacy:   [facility] [place], [address] AT [address] [place] [address] Phone: [phone] Fax: [phone]  [facility] [address] [place] [address] Phone: [phone] Fax: [place], [place] [address] [place] [address] Phone: [phone] Fax: [phone]     Social Determinants of Health (SDOH) Interventions    Readmission Risk Interventions    10/15/2021    4:23 PM  Readmission Risk Prevention Plan  Transportation Screening Complete  PCP or Specialist Appt within 3-5 Days Complete  HRI or [place] Complete  Social Work Consult for [place] Planning/Counseling Complete  Palliative Care Screening Not Applicable  Medication Review [employer]) Complete

## 2021-10-15 NOTE — Progress Notes (Signed)
Pt A/O x 4. Admitted for pulmonary edema. See note. VSS. Denies pain and SOB. Dressing to calf change. Education provided on meds and follow up care. Pt adequate to discharge per orders.

## 2021-10-15 NOTE — Discharge Summary (Signed)
Physician Discharge Summary   Patient: Kendra Clay MRN: [id number] DOB: 06/16/1960  Admit date:     10/12/2021  Discharge date: 10/15/21  Discharge Physician: Raiford Noble, DO   PCP: Patient, [name]   Recommendations at discharge:   Follow-up with PCP within 1 to 2 weeks and repeat CBC, CMP, mag, Phos within 1 week Follow-up with nephrology within 1 to 2 weeks and continue normally scheduled dialysis Monday Wednesday Friday  Discharge Diagnoses: Principal Problem:   Acute pulmonary edema ([place]) Active Problems:   Acute on chronic combined systolic and diastolic CHF (congestive heart failure) ([place])   COPD with acute exacerbation (HCC)   ESRD on hemodialysis ([place])   Coronary artery disease involving native coronary artery of native heart without angina pectoris   GERD without esophagitis   Pressure injury of skin   Anemia of chronic disease   Thrombocytopenia (HCC)   Hyperbilirubinemia   Hyponatremia   Hypomagnesemia   Leg wound, right  Resolved Problems:   * No resolved hospital problems. [facility] Course: The patient is a 61 year old chronically ill-appearing female with past medical history significant for but not limited to end-stage renal disease on hemodialysis Monday Wednesday Friday, history of polycystic kidney disease, anemia of chronic kidney disease, COPD, GERD, CAD status post stenting as well as history of chronic systolic and diastolic CHF with a EF last documented of 40% with grade 1 diastolic dysfunction who presented to the emergency department with shortness of breath.  Of note she was recently hospitalized from [facility] back in July to early August with a right tibial shaft fracture and underwent intramedullary narrowing and she was eventually discharged to inpatient rehab from 8/7 until 8/22.  She says approximate over the last few days she developed shortness of breath and it was moderate in severity with intensity and worse with exertion and improved  with rest.  She also complained of some orthopnea and paroxysmal nocturnal dyspnea with increased bilateral lower extremity swelling.  Symptoms continue to worsen and she denies any cough or fevers and so she presents [facility].  Upon further evaluation in the ED she had chest x-ray done which showed patchy right lower lobe opacity concerning for pneumonia with additional superimposed concerns for possible pulmonary edema.  The EDP initiated IV ceftriaxone doxycycline and asked the hospitalist to admit for acute respiratory failure with hypoxia and given her concern for volume overload and need for dialysis the nephrologist was consulted.   She went to dialysis yesterday and nephrology evaluated again today and will be taking her back to dialysis.  She is improving and oxygen is weaning.  Patient still feels a little congested so we have ordered an incentive slumber and flutter valve as well as guaifenesin.    She is improving after her dialysis and appears like she can breathe again.  Ambulatory home O2 screen done and she did not desaturate.  PT OT recommending home health.  She is medically stable for discharge and will need to follow-up with nephrology and PCP within 1 to 2 weeks.  Assessment and Plan: Acute pulmonary edema ([place]), improved  Patient presenting with respiratory distress, patchy bilateral infiltrates on chest x-ray and evidence of diffuse pulmonary rales on examination Clinical concern for mixed acute pulmonary edema superimposed on a mild COPD exacerbation Placing patient on scheduled bronchodilator therapy, supplemental oxygen for bouts of hypoxia Treating patient's hypertension with as needed intravenous hydralazine If patient clinically worsens will initiate noninvasive positive pressure ventilation  Nephrology has been consulted and recommending dialysis for secondary neuro EDP was initially concerned for possible superimposed bacterial pneumonia however in the absence  of fever or leukocytosis I believe this is unlikely.  We will hold off on additional antibiotics for now and obtain a CRP and procalcitonin.  CRP was 0.6 and procalcitonin level was 1.06 but not very reliable in the setting of end-stage renal disease Hold off any further antibiotics given that she is afebrile and has no leukocytosis See below and repeat chest x-ray in a.m. Follow-up with nephrology in outpatient setting   Acute on chronic combined systolic and diastolic CHF (congestive heart failure) (HCC) Some degree of acute diastolic distal heart failure likely contributing to patient's pulmonary edema and overall shortness of breath Due to patient's stage renal disease definitive treatment would be dialysis BNP was greater than 4500; now weight is down 8 pounds since admission Plan on consulting nephrology as noted above Otherwise monitoring on telemetry Strict input and output monitoring and daily weights; she is -2.279 liters since admission and will undergo another dialysis session today Chest x-ray today showed "Mildly improved bilateral interstitial densities are noted suggesting improving edema or possibly atypical inflammation. Small left pleural effusion is noted.." She will need an ambulatory home O2 screen prior to discharge and she not desaturate   COPD with acute exacerbation ([place]) Patient has had some significant improvement in shortness of breath with administration of several rounds of bronchodilator therapy making me think that there is a component of COPD exacerbation We will place on scheduled bronchodilator therapy for now SpO2: 99 % O2 Flow Rate (L/min): 2 L/min FiO2 (%): 100 %; continue to wean supplemental oxygen and was taken off of it We will add guaifenesin 1200 was p.o. twice daily, flutter valve and incentive spirometry Patient may benefit from a short course of systemic steroids if patient fails to clinically improve with dialysis Ultimately, patient would  benefit from some home-going inhalers as patient states that he does not have any She will need an an ambulatory home O2 screen prior to discharge and this was done and she did not desaturate and repeat chest x-ray as above   ESRD on hemodialysis [facility]) Patient typically receives hemodialysis on Wednesday and Friday via right upper extremity AV fistula Consulted Nephrology in the morning for resumption of hemodialysis while hospitalized and Dr. Jonnie Finner is aware Monitoring renal function and electrolytes with serial chemistries; she underwent hemodialysis yesterday and will undergo hemodialysis again today given that she still appears a little volume overloaded -In yesterday's dialysis session she had 3 L of ultrafiltration and feels much better and edema is improving.  She remains on 1 L and potassium was 4.0 Per discussion with Dr. Joelyn Oms she is on the schedule for today for 2 to 3 L of ultrafiltration and then transitioning back to Monday Wednesday Friday dialysis sessions Fluid restricted renal diet We will need to avoid further nephrotoxic medications, contrast dyes, hypotension and dehydration to ensure adequate renal perfusion Pateint's BUN/Cr went from 13/5.31 -> 14/5.63 -> 8/3.85 and repeat today was 9/3.85 Remainder of assessment and plan as above Continue to monitor and trend renal function carefully and repeat CMP in the a.m. She had nephrology evaluation and assistance   Coronary artery disease involving native coronary artery of native heart without angina pectoris -Patient is currently chest pain free -Monitoring patient on telemetry -Continue home regimen of  lipid lowering therapy and AV nodal blocking therapy   GERD without esophagitis/GI Prophylaxis  -Continuing  home regimen of PPI therapy with pantoprazole 40 mg p.o. twice daily and continue with discharge   Anemia of Chronic Kidney Disease -Patient's hemoglobin/hematocrit is now 10.2/33.2 -> 8.8/28.0 and is now 8.6/27.8  with -Appears relatively stable -Check Anemia Panel and iron level is now 85, TIBC is 14, TIBC 99, saturation 96%, ferritin level is now 2504, folate level is 4.5, vitamin B12 433 Hypertension monitor for signs symptoms of bleeding; no overt bleeding noted -Repeat CBC in 1 week   Thrombocytopenia -The patient's platelet count has been on the lower side and has ranged from 103 -> 104 -> 97 and is now 81 but no evidence of bleeding -Continue to monitor for signs and symptoms of bleeding; no overt bleeding noted -Repeat CBC in a.m.   Hyperbilirubinemia -Patient's T. bili has trended down and gone from 1.4 -> 1.1 and slightly bumped to 1.4 -Likely reactive and will need to monitor trend and repeat CMP in a.m.   Hyponatremia -Likely in the setting of hypervolemia and will need to be corrected in dialysis -Patient's sodium is now 130 -> 132 x2 -Continue monitor and trend and repeat CMP in a.m.   Hypomagnesemia -We will defer to nephrology to replete -Patient's magnesium has gone from 1.5 -> 1.6 and dropped to 1.3 so we will replete with IV mag sulfate 2 g prior to discharge -Likely to be corrected in the setting of dialysis -Continue monitor and trend and repeat magnesium level in a.m.  Hypophosphatemia The patient's Phos level is 1.8 -Likely be corrected in dialysis tomorrow Continue monitor and trend and repeat Phos level within 1 week   Right leg wound -Has a right anterior calf wound -WOC nurse consulted and topical treatment orders have been provided for the bedside nurses to perform and they are recommending applying Bactroban to the right calf wound daily and covering with a foam dressing  Pressure Wound  Pressure Ulcer  Duration          Pressure Injury 10/13/21 Sacrum Bilateral Stage 1 -  Intact skin with non-blanchable redness of a localized area usually over a bony prominence. 1 day           Consultants: Nephrology Procedures performed: Dialysis Disposition: Home  health Diet recommendation:  Discharge Diet Orders (From admission, onward)     Start     Ordered   10/15/21 0000  Diet - low sodium heart healthy       Comments: With 1200 mL Fluid Restriction   10/15/21 1335           Renal diet DISCHARGE MEDICATION: Allergies as of 10/15/2021       Reactions   E-mycin [erythromycin] Hives   Sulfa Antibiotics Rash   Other reaction(s): Skin Rash Other reaction(s): Skin Rash   Lisinopril Cough        Medication List     TAKE these medications    acetaminophen 325 MG tablet Commonly known as: TYLENOL Take 1-2 tablets (325-650 mg total) by mouth every 4 (four) hours as needed for mild pain.   atorvastatin 40 MG tablet Commonly known as: LIPITOR Take 40 mg by mouth at bedtime. Notes to patient: Take @ bedtime 10/15/21   carvedilol 12.5 MG tablet Commonly known as: COREG Take 12.5 mg by mouth 2 (two) times daily. Notes to patient: Take @ bedtime 10/15/21   cinacalcet 30 MG tablet Commonly known as: SENSIPAR Take 30 mg by mouth daily with supper. Notes to patient: Take this evening 10/15/21   Darbepoetin  Alfa 150 MCG/0.3ML Sosy injection Commonly known as: ARANESP Inject 0.3 mLs (150 mcg total) into the vein every Wednesday with hemodialysis.   dextromethorphan-guaiFENesin 30-600 MG 12hr tablet Commonly known as: MUCINEX DM Take 2 tablets by mouth daily as needed for cough.   DIALYVITE 800 WITH ZINC 0.8 MG Tabs Take 1 tablet by mouth daily.   doxercalciferol 4 MCG/2ML injection Commonly known as: HECTOROL Inject 4 mLs (8 mcg total) into the vein every Monday, Wednesday, and Friday with hemodialysis.   ferric citrate 1 GM 210 MG(Fe) tablet Commonly known as: AURYXIA Take 210 mg by mouth 3 (three) times daily with meals.   isosorbide mononitrate 30 MG 24 hr tablet Commonly known as: IMDUR Take 30 mg by mouth daily.   lidocaine 5 % Commonly known as: LIDODERM Place 1 patch onto the skin daily. Remove & Discard patch  within 12 hours or as directed by MD What changed:  when to take this reasons to take this additional instructions   loperamide 2 MG tablet Commonly known as: IMODIUM A-D Take 2 mg by mouth daily as needed for diarrhea or loose stools.   loratadine 10 MG tablet Commonly known as: CLARITIN Take 10 mg by mouth daily as needed for allergies.   methocarbamol 500 MG tablet Commonly known as: ROBAXIN Take 1 tablet (500 mg total) by mouth every 6 (six) hours as needed for muscle spasms.   midodrine 5 MG tablet Commonly known as: PROAMATINE Take 1 tablet (5 mg total) by mouth as needed (for hypotension during dialysis).   mupirocin ointment 2 % Commonly known as: BACTROBAN Apply topically daily. Apply Bactroban to right calf wound Q day, then cover with foam dressing.  (Change foam dressing Q 3 days or PRN soiling.)   nitroGLYCERIN 0.4 MG SL tablet Commonly known as: NITROSTAT Place 0.4 mg under the tongue every 5 (five) minutes as needed for chest pain.   ondansetron 4 MG tablet Commonly known as: ZOFRAN Take 1 tablet (4 mg total) by mouth every 6 (six) hours as needed for nausea.   oxyCODONE 5 MG immediate release tablet Commonly known as: Oxy IR/ROXICODONE Take 1 tablet (5 mg total) by mouth every 8 (eight) hours as needed for severe pain.   pantoprazole 40 MG tablet Commonly known as: PROTONIX Take 40 mg by mouth 2 (two) times daily.   polyethylene glycol powder 17 GM/SCOOP powder Commonly known as: MiraLax Start taking 1 capful 3 times a day. Slowly cut back as needed until you have normal bowel movements. What changed:  how much to take how to take this when to take this reasons to take this additional instructions   Vitamin D (Ergocalciferol) 1.25 MG (50000 UNIT) Caps capsule Commonly known as: DRISDOL Take 1 capsule (50,000 Units total) by mouth every 7 (seven) days.               Discharge Care Instructions  (From admission, onward)            Start     Ordered   10/15/21 0000  Discharge wound care:       Comments: Apply Bactroban to right calf wound Q day, then cover with foam dressing.  (Change foam dressing Q 3 days or PRN soiling.)   10/15/21 1335            Follow-up [place]. Go on 11/01/2021.   Specialty: Internal Medicine Why: @2 :40pm Contact information: [address], [facility] Follow up.   Specialty: Home Health Services Why: Agency will call you with apt times Contact information: [address] [place] [address] [phone]                Discharge Exam: Filed Weights   10/13/21 1855 10/14/21 0442 10/15/21 0300  Weight: 58.7 kg 59.4 kg 58.1 kg   Vitals:   10/15/21 0721 10/15/21 0811  BP: (!) 156/83   Pulse: 81 85  Resp: 16   Temp: (!) 97.5 F (36.4 C)   SpO2: 99%    Examination: Physical Exam:  Constitutional: WN/WD Caucasian female currently no acute distress Respiratory: Diminished to auscultation bilaterally with coarse breath sounds and some slight crackles, no wheezing, rales, rhonchi. Normal respiratory effort and patient is not tachypenic. No accessory muscle use.  She is wearing 1 L supplemental oxygen via nasal cannula and did not ambulate this yet Cardiovascular: RRR, no murmurs / rubs / gallops. S1 and S2 auscultated.  Mild 1+ lower extremity edema Abdomen: Soft, non-tender, distended secondary body habitus. Bowel sounds positive.  GU: Deferred. Musculoskeletal: No clubbing / cyanosis of digits/nails. No joint deformity upper and lower extremities but he has a right arm AV fistula Skin: No rashes, lesions, ulcers. No induration; Warm and dry.  Neurologic: CN 2-12 grossly intact with no focal deficits.  Romberg sign and cerebellar reflexes not assessed.  Psychiatric: Normal judgment and insight. Alert and oriented x 3. Normal mood and appropriate  affect.   Condition at discharge: stable  The results of significant diagnostics from this hospitalization (including imaging, microbiology, ancillary and laboratory) are listed below for reference.   Imaging Studies: DG CHEST PORT 1 VIEW  Result Date: 10/15/2021 CLINICAL DATA:  Shortness of breath. EXAM: PORTABLE CHEST 1 VIEW COMPARISON:  October 14, 2021. FINDINGS: Stable cardiomediastinal silhouette. Mildly improved diffuse interstitial densities are noted throughout both lungs which may represent edema or possibly atypical inflammation. Small left pleural effusion is noted. Bony thorax is unremarkable. IMPRESSION: Mildly improved bilateral interstitial densities are noted suggesting improving edema or possibly atypical inflammation. Small left pleural effusion is noted. Electronically Signed   By: Marijo Conception M.D.   On: 10/15/2021 08:22   DG CHEST PORT 1 VIEW  Result Date: 10/14/2021 CLINICAL DATA:  [id number], shortness of breath on exertion with end-stage renal failure. EXAM: PORTABLE CHEST 1 VIEW COMPARISON:  AP Lat 10/12/2021 FINDINGS: Stable cardiomegaly. There is mild perihilar vascular congestion, mild generalized interstitial edema and small pleural effusions. There is patchy haziness in the lower lung fields which could be due to atelectasis, pneumonia or ground-glass edema. The opacities are less dense than previously. There is a stable mediastinum with mild aortic atherosclerosis and tortuosity. No other focal lung infiltrate is seen. There is upper thoracic levoscoliosis. IMPRESSION: 1. Perihilar vascular congestion and mild interstitial edema. 2. Small pleural effusions with patchy haziness in the lower lung fields consistent with atelectasis, pneumonia or edema. The opacities are less dense than previously. Electronically Signed   By: Telford Nab M.D.   On: 10/14/2021 05:36   DG Chest 2 View  Result Date: 10/12/2021 CLINICAL DATA:  Shortness of breath EXAM: CHEST - 2 VIEW  COMPARISON:  08/22/2021 FINDINGS: Mild patchy right lower lobe opacity, suspicious for pneumonia. Increased interstitial markings, raising the possibility of superimposed interstitial edema. No definite pleural effusions. Cardiomegaly. Mild degenerative changes of the visualized thoracolumbar spine. IMPRESSION:  Mild patchy right lower lobe opacity, suspicious for pneumonia. Cardiomegaly with possible mild superimposed interstitial edema. Electronically Signed   By: Julian Hy M.D.   On: 10/12/2021 22:30    Microbiology: Results for orders placed or performed during the hospital encounter of 10/12/21  Resp Panel by RT-PCR (Flu A&B, Covid) Anterior Nasal Swab     Status: None   Collection Time: 10/12/21 11:50 PM   Specimen: Anterior Nasal Swab  Result Value Ref Range Status   SARS Coronavirus 2 by RT PCR NEGATIVE NEGATIVE Final    Comment: (NOTE) SARS-CoV-2 target nucleic acids are NOT DETECTED.  The SARS-CoV-2 RNA is generally detectable in upper respiratory specimens during the acute phase of infection. The lowest concentration of SARS-CoV-2 viral copies this assay can detect is 138 copies/mL. A negative result does not preclude SARS-Cov-2 infection and should not be used as the sole basis for treatment or other patient management decisions. A negative result may occur with  improper specimen collection/handling, submission of specimen other than nasopharyngeal swab, presence of viral mutation(s) within the areas targeted by this assay, and inadequate number of viral copies(<138 copies/mL). A negative result must be combined with clinical observations, patient history, and epidemiological information. The expected result is Negative.  Fact Sheet for Patients:  EntrepreneurPulse.com.au  Fact Sheet for Healthcare Providers:  IncredibleEmployment.be  This test is no t yet approved or cleared by the [place] FDA and  has been authorized for  detection and/or diagnosis of SARS-CoV-2 by FDA under an Emergency Use Authorization (EUA). This EUA will remain  in effect (meaning this test can be used) for the duration of the COVID-19 declaration under Section 564(b)(1) of the Act, 21 U.S.C.section 360bbb-3(b)(1), unless the authorization is terminated  or revoked sooner.       Influenza A by PCR NEGATIVE NEGATIVE Final   Influenza B by PCR NEGATIVE NEGATIVE Final    Comment: (NOTE) The Xpert Xpress SARS-CoV-2/FLU/RSV plus assay is intended as an aid in the diagnosis of influenza from Nasopharyngeal swab specimens and should not be used as a sole basis for treatment. Nasal washings and aspirates are unacceptable for Xpert Xpress SARS-CoV-2/FLU/RSV testing.  Fact Sheet for Patients: EntrepreneurPulse.com.au  Fact Sheet for Healthcare Providers: IncredibleEmployment.be  This test is not yet approved or cleared by the [place] FDA and has been authorized for detection and/or diagnosis of SARS-CoV-2 by FDA under an Emergency Use Authorization (EUA). This EUA will remain in effect (meaning this test can be used) for the duration of the COVID-19 declaration under Section 564(b)(1) of the Act, 21 U.S.C. section 360bbb-3(b)(1), unless the authorization is terminated or revoked.  Performed at [facility], [place] [address], [place], [place] [address]   Culture, blood (Routine X 2) w Reflex to ID Panel     Status: None (Preliminary result)   Collection Time: 10/13/21  5:11 AM   Specimen: BLOOD  Result Value Ref Range Status   Specimen Description BLOOD LEFT ANTECUBITAL  Final   Special Requests   Final    BOTTLES DRAWN AEROBIC ONLY Blood Culture results may not be optimal due to an inadequate volume of blood received in culture bottles   Culture   Final    NO GROWTH 2 DAYS Performed at [facility], [place] [address], [place], [place] [address]    Report Status PENDING   Incomplete   Labs: CBC: Recent Labs  Lab 10/12/21 2157 10/12/21 2204 10/13/21 0550 10/14/21 0423 10/15/21 0507  WBC  4.1  --  4.4 2.7* 2.8*  NEUTROABS 2.4  --  2.8 1.3* 1.4*  HGB 10.8* 11.6* 10.2* 8.8* 8.6*  HCT 34.7* 34.0* 33.2* 28.0* 27.8*  MCV 97.7  --  99.4 97.6 100.4*  PLT 103*  --  104* 97* 81*   Basic Metabolic Panel: Recent Labs  Lab 10/12/21 2157 10/12/21 2204 10/13/21 0550 10/13/21 1125 10/14/21 0423 10/15/21 0507  NA 132* 131* 130*  --  132* 132*  K 3.8 3.8 4.0  --  4.0 3.7  CL 95*  --  93*  --  96* 99  CO2 25  --  23  --  30 27  GLUCOSE 95  --  102*  --  99 102*  BUN 13  --  14  --  8 9  CREATININE 5.31*  --  5.63*  --  3.85* 3.85*  CALCIUM 8.5*  --  8.4*  --  7.4* 7.4*  MG  --   --  1.5*  --  1.6* 1.3*  PHOS  --   --   --  3.3 2.5 1.8*   Liver Function Tests: Recent Labs  Lab 10/13/21 0550 10/14/21 0423 10/15/21 0507  AST 23 18 19   ALT 13 11 12   ALKPHOS 247* 215* 208*  BILITOT 1.4* 1.1 1.4*  PROT 5.6* 5.4* 5.4*  ALBUMIN 2.3* 1.9* 2.1*   CBG: No results for input(s): "GLUCAP" in the last 168 hours.  Discharge time spent: greater than 30 minutes.  Signed: Raiford Noble, DO [facility] 10/15/2021

## 2021-10-16 ENCOUNTER — Telehealth (HOSPITAL_COMMUNITY): Payer: Self-pay | Admitting: Nephrology

## 2021-10-16 LAB — HEPATITIS B CORE ANTIBODY, TOTAL

## 2021-10-16 NOTE — Telephone Encounter (Signed)
Transition of care contact from inpatient facility  Date of Discharge: 10/15/21 Date of Contact: 10/16/21 -- attempted Method of contact: Phone  Attempted to contact patient to discuss transition of care from inpatient admission. Patient did not answer the phone. Message was left on the patient's voicemail with call back number [phone].  [name], PA-C [employer] Pager [phone]

## 2021-10-18 LAB — CULTURE, BLOOD (ROUTINE X 2): Culture: NO GROWTH

## 2021-11-01 ENCOUNTER — Inpatient Hospital Stay: Payer: Self-pay | Admitting: Nurse Practitioner

## 2022-01-21 ENCOUNTER — Other Ambulatory Visit: Payer: Self-pay | Admitting: Student

## 2022-01-21 DIAGNOSIS — M810 Age-related osteoporosis without current pathological fracture: Secondary | ICD-10-CM

## 2022-06-26 ENCOUNTER — Encounter: Payer: Self-pay | Admitting: Vascular Surgery

## 2022-06-26 ENCOUNTER — Ambulatory Visit (INDEPENDENT_AMBULATORY_CARE_PROVIDER_SITE_OTHER): Payer: Medicare Other | Admitting: Vascular Surgery

## 2022-06-26 VITALS — BP 165/84 | HR 65 | Temp 98.3°F | Resp 20 | Ht 63.0 in | Wt 126.0 lb

## 2022-06-26 DIAGNOSIS — Z992 Dependence on renal dialysis: Secondary | ICD-10-CM

## 2022-06-26 DIAGNOSIS — N186 End stage renal disease: Secondary | ICD-10-CM

## 2022-06-26 NOTE — Progress Notes (Signed)
ASSESSMENT & PLAN   END-STAGE RENAL DISEASE: This patient has an aneurysmal right forearm AV fistula which they have had for many years.  The fistula is pulsatile.  Before scheduling plication of her aneurysm I have recommended that we proceed with a fistulogram to rule out any outflow stenosis.  She dialyzes on Monday Wednesdays and Fridays and will schedule this on a Tuesday or Thursday.  Once this is complete she can be scheduled for me to ligate her fistula on a Tuesday.  She is not on any blood thinners.  She does not have a pacemaker.  I have discussed the indications for the procedure and the potential complications and she is agreeable to proceed.  She understands we find disease amenable to angioplasty this would be done at the same time.  REASON FOR CONSULT:    Aneurysm of AV fistula.  The consult is requested by Dr. Glenna Fellows.   HPI:   Kendra Clay is a 62 y.o. female who had a right forearm AV fistula placed about 10 years ago.  The fistula has been working well although it has become markedly aneurysmal.  She was referred because of some thinning of the skin overlying the central segment of the fistula in her forearm.  She states that the fistula has been working well although occasionally depending upon where they stick it, she may have some bleeding issues.  She dialyzes on Monday Wednesdays and Fridays.  She has not had access in the left arm.  Past Medical History:  Diagnosis Date   Complication of anesthesia    Enlarged liver    secondary to PKD   ESRD on hemodialysis (HCC)    MWF [place]   Heart murmur    "slight" per ? Dr Arlyce Dice 30 years ago. 2D ECHO  30 yearsa go.   History of blood transfusion    C- Section   History of bronchitis    numerous, last time> 1 year   Hypertension    Night muscle spasms    legs   Polycystic kidney disease    Genetic dx 23 years ago   PONV (postoperative nausea and vomiting)    patch helped   Scoliosis    Strabismus      Family History  Problem Relation Age of Onset   Arthritis Mother    Kidney disease Father    Polycystic kidney disease Son    Asthma Son    Hypertension Maternal Grandmother     SOCIAL HISTORY: Social History   Tobacco Use   Smoking status: Former    Packs/day: 1.50    Years: 30.00    Additional pack years: 0.00    Total pack years: 45.00    Types: Cigarettes    Start date: 39    Quit date: 01/10/2015    Years since quitting: 7.4   Smokeless tobacco: Former    Quit date: 01/09/2014  Substance Use Topics   Alcohol use: No    Alcohol/week: 0.0 standard drinks of alcohol    Allergies  Allergen Reactions   E-Mycin [Erythromycin] Hives   Sulfa Antibiotics Rash    Other reaction(s): Skin Rash Other reaction(s): Skin Rash    Lisinopril Cough    Current Outpatient Medications  Medication Sig Dispense Refill   acetaminophen (TYLENOL) 325 MG tablet Take 1-2 tablets (325-650 mg total) by mouth every 4 (four) hours as needed for mild pain.     atorvastatin (LIPITOR) 40 MG tablet Take 40 mg by mouth at  bedtime.     B Complex-C-Zn-Folic Acid (DIALYVITE 800 WITH ZINC) 0.8 MG TABS Take 1 tablet by mouth daily.     carvedilol (COREG) 12.5 MG tablet Take 12.5 mg by mouth 2 (two) times daily.     cinacalcet (SENSIPAR) 30 MG tablet Take 30 mg by mouth daily with supper.     Darbepoetin Alfa (ARANESP) 150 MCG/0.3ML SOSY injection Inject 0.3 mLs (150 mcg total) into the vein every Wednesday with hemodialysis. 1.68 mL    dextromethorphan-guaiFENesin (MUCINEX DM) 30-600 MG 12hr tablet Take 2 tablets by mouth daily as needed for cough.     doxercalciferol (HECTOROL) 4 MCG/2ML injection Inject 4 mLs (8 mcg total) into the vein every Monday, Wednesday, and Friday with hemodialysis. 2 mL    ferric citrate (AURYXIA) 1 GM 210 MG(Fe) tablet Take 210 mg by mouth 3 (three) times daily with meals.     isosorbide mononitrate (IMDUR) 30 MG 24 hr tablet Take 30 mg by mouth daily.     lidocaine  (LIDODERM) 5 % Place 1 patch onto the skin daily. Remove & Discard patch within 12 hours or as directed by MD (Patient taking differently: Place 1 patch onto the skin daily as needed (pain).) 20 patch 0   loperamide (IMODIUM A-D) 2 MG tablet Take 2 mg by mouth daily as needed for diarrhea or loose stools.     loratadine (CLARITIN) 10 MG tablet Take 10 mg by mouth daily as needed for allergies.     losartan (COZAAR) 25 MG tablet Take 25 mg by mouth daily.     methocarbamol (ROBAXIN) 500 MG tablet Take 1 tablet (500 mg total) by mouth every 6 (six) hours as needed for muscle spasms. 10 tablet 0   midodrine (PROAMATINE) 5 MG tablet Take 1 tablet (5 mg total) by mouth as needed (for hypotension during dialysis).     mupirocin ointment (BACTROBAN) 2 % Apply topically daily. Apply Bactroban to right calf wound Q day, then cover with foam dressing.  (Change foam dressing Q 3 days or PRN soiling.) 22 g 0   nitroGLYCERIN (NITROSTAT) 0.4 MG SL tablet Place 0.4 mg under the tongue every 5 (five) minutes as needed for chest pain.     ondansetron (ZOFRAN) 4 MG tablet Take 1 tablet (4 mg total) by mouth every 6 (six) hours as needed for nausea. 20 tablet 0   pantoprazole (PROTONIX) 40 MG tablet Take 40 mg by mouth 2 (two) times daily.     polyethylene glycol powder (MIRALAX) powder Start taking 1 capful 3 times a day. Slowly cut back as needed until you have normal bowel movements. (Patient taking differently: Take 17 g by mouth daily as needed for mild constipation.) 255 g 0   Vitamin D, Ergocalciferol, (DRISDOL) 1.25 MG (50000 UNIT) CAPS capsule Take 1 capsule (50,000 Units total) by mouth every 7 (seven) days. 3 capsule 0   oxyCODONE (OXY IR/ROXICODONE) 5 MG immediate release tablet Take 1 tablet (5 mg total) by mouth every 8 (eight) hours as needed for severe pain. (Patient not taking: Reported on 06/26/2022) 21 tablet 0   No current facility-administered medications for this visit.    REVIEW OF SYSTEMS:  [X]   denotes positive finding, [ ]  denotes negative finding Cardiac  Comments:  Chest pain or chest pressure:    Shortness of breath upon exertion:    Short of breath when lying flat:    Irregular heart rhythm:        Vascular  Pain in calf, thigh, or hip brought on by ambulation:    Pain in feet at night that wakes you up from your sleep:     Blood clot in your veins:    Leg swelling:         Pulmonary    Oxygen at home:    Productive cough:     Wheezing:         Neurologic    Sudden weakness in arms or legs:     Sudden numbness in arms or legs:     Sudden onset of difficulty speaking or slurred speech:    Temporary loss of vision in one eye:     Problems with dizziness:         Gastrointestinal    Blood in stool:     Vomited blood:         Genitourinary    Burning when urinating:     Blood in urine:        Psychiatric    Major depression:         Hematologic    Bleeding problems:    Problems with blood clotting too easily:        Skin    Rashes or ulcers:        Constitutional    Fever or chills:    -  PHYSICAL EXAM:   Vitals:   06/26/22 1109  BP: (!) 165/84  Pulse: 65  Resp: 20  Temp: 98.3 F (36.8 C)  SpO2: 98%  Weight: 126 lb (57.2 kg)  Height: 5\' 3"  (1.6 m)   Body mass index is 22.32 kg/m. GENERAL: The patient is a well-nourished female, in no acute distress. The vital signs are documented above. CARDIAC: There is a regular rate and rhythm.  VASCULAR: She has palpable radial pulses. The fistula is pulsatile.     PULMONARY: There is good air exchange bilaterally without wheezing or rales. MUSCULOSKELETAL: There are no major deformities. NEUROLOGIC: No focal weakness or paresthesias are detected. SKIN: There are no ulcers or rashes noted. PSYCHIATRIC: The patient has a normal affect.  DATA:    No new data   Waverly Ferrari [facility]

## 2022-06-27 ENCOUNTER — Other Ambulatory Visit: Payer: Self-pay | Admitting: *Deleted

## 2022-06-27 DIAGNOSIS — N186 End stage renal disease: Secondary | ICD-10-CM

## 2022-06-27 MED ORDER — SODIUM CHLORIDE 0.9% FLUSH
3.0000 mL | INTRAVENOUS | Status: DC | PRN
Start: 2022-06-27 — End: 2023-05-23

## 2022-06-27 MED ORDER — SODIUM CHLORIDE 0.9 % IV SOLN
250.0000 mL | INTRAVENOUS | Status: DC | PRN
Start: 2022-06-27 — End: 2023-05-23

## 2022-06-27 MED ORDER — SODIUM CHLORIDE 0.9% FLUSH
3.0000 mL | Freq: Two times a day (BID) | INTRAVENOUS | Status: DC
Start: 2022-06-27 — End: 2023-05-23

## 2022-07-10 ENCOUNTER — Ambulatory Visit (HOSPITAL_COMMUNITY)
Admission: RE | Disposition: A | Discharge status: Home / Self Care | Payer: Self-pay | Source: Home / Self Care | Attending: Vascular Surgery

## 2022-07-10 ENCOUNTER — Other Ambulatory Visit: Payer: Self-pay

## 2022-07-10 ENCOUNTER — Ambulatory Visit (HOSPITAL_COMMUNITY)
Admission: RE | Admit: 2022-07-10 | Discharge: 2022-07-10 | Disposition: A | Discharge status: Home / Self Care | Payer: Medicare Other | Attending: Vascular Surgery | Admitting: Vascular Surgery

## 2022-07-10 DIAGNOSIS — N185 Chronic kidney disease, stage 5: Secondary | ICD-10-CM

## 2022-07-10 DIAGNOSIS — Z992 Dependence on renal dialysis: Secondary | ICD-10-CM | POA: Diagnosis not present

## 2022-07-10 DIAGNOSIS — Z87891 Personal history of nicotine dependence: Secondary | ICD-10-CM | POA: Diagnosis not present

## 2022-07-10 DIAGNOSIS — T82898A Other specified complication of vascular prosthetic devices, implants and grafts, initial encounter: Secondary | ICD-10-CM

## 2022-07-10 DIAGNOSIS — N186 End stage renal disease: Secondary | ICD-10-CM | POA: Diagnosis not present

## 2022-07-10 DIAGNOSIS — Y832 Surgical operation with anastomosis, bypass or graft as the cause of abnormal reaction of the patient, or of later complication, without mention of misadventure at the time of the procedure: Secondary | ICD-10-CM | POA: Insufficient documentation

## 2022-07-10 DIAGNOSIS — T82856A Stenosis of peripheral vascular stent, initial encounter: Secondary | ICD-10-CM | POA: Insufficient documentation

## 2022-07-10 DIAGNOSIS — I120 Hypertensive chronic kidney disease with stage 5 chronic kidney disease or end stage renal disease: Secondary | ICD-10-CM | POA: Insufficient documentation

## 2022-07-10 HISTORY — PX: PERIPHERAL VASCULAR BALLOON ANGIOPLASTY: CATH118281

## 2022-07-10 HISTORY — PX: A/V FISTULAGRAM: CATH118298

## 2022-07-10 LAB — POCT I-STAT, CHEM 8
BUN: 29 mg/dL — ABNORMAL HIGH (ref 8–23)
Calcium, Ion: 1.23 mmol/L (ref 1.15–1.40)
Chloride: 91 mmol/L — ABNORMAL LOW (ref 98–111)
Creatinine, Ser: 6.7 mg/dL — ABNORMAL HIGH (ref 0.44–1.00)
Glucose, Bld: 87 mg/dL (ref 70–99)
HCT: 34 % — ABNORMAL LOW (ref 36.0–46.0)
Hemoglobin: 11.6 g/dL — ABNORMAL LOW (ref 12.0–15.0)
Potassium: 3.7 mmol/L (ref 3.5–5.1)
Sodium: 134 mmol/L — ABNORMAL LOW (ref 135–145)
TCO2: 33 mmol/L — ABNORMAL HIGH (ref 22–32)

## 2022-07-10 SURGERY — A/V FISTULAGRAM
Anesthesia: LOCAL | Laterality: Right

## 2022-07-10 MED ORDER — HEPARIN SODIUM (PORCINE) 1000 UNIT/ML IJ SOLN
INTRAMUSCULAR | Status: DC | PRN
  Administered 2022-07-10: 5000 [IU] via INTRAVENOUS

## 2022-07-10 MED ORDER — MIDAZOLAM HCL 2 MG/2ML IJ SOLN
INTRAMUSCULAR | Status: AC
  Filled 2022-07-10: qty 2

## 2022-07-10 MED ORDER — HYDRALAZINE HCL 20 MG/ML IJ SOLN
INTRAMUSCULAR | Status: DC | PRN
  Administered 2022-07-10: 10 mg via INTRAVENOUS

## 2022-07-10 MED ORDER — LIDOCAINE HCL (PF) 1 % IJ SOLN
INTRAMUSCULAR | Status: DC | PRN
  Administered 2022-07-10: 5 mL

## 2022-07-10 MED ORDER — FENTANYL CITRATE (PF) 100 MCG/2ML IJ SOLN
INTRAMUSCULAR | Status: AC
  Filled 2022-07-10: qty 2

## 2022-07-10 MED ORDER — LIDOCAINE HCL (PF) 1 % IJ SOLN
INTRAMUSCULAR | Status: AC
  Filled 2022-07-10: qty 30

## 2022-07-10 MED ORDER — HYDRALAZINE HCL 20 MG/ML IJ SOLN
INTRAMUSCULAR | Status: AC
  Filled 2022-07-10: qty 1

## 2022-07-10 MED ORDER — HEPARIN SODIUM (PORCINE) 1000 UNIT/ML IJ SOLN
INTRAMUSCULAR | Status: AC
  Filled 2022-07-10: qty 10

## 2022-07-10 MED ORDER — IODIXANOL 320 MG/ML IV SOLN
INTRAVENOUS | Status: DC | PRN
  Administered 2022-07-10: 70 mL

## 2022-07-10 SURGICAL SUPPLY — 17 items
BAG SNAP BAND KOVER 36X36 (MISCELLANEOUS) ×2 IMPLANT
BALLN IN.PACT DCB 7X60 (BALLOONS) ×2
BALLN MUSTANG 6X60X75 (BALLOONS) ×2
BALLOON MUSTANG 6X60X75 (BALLOONS) IMPLANT
COVER DOME SNAP 22 D (MISCELLANEOUS) ×2 IMPLANT
DCB IN.PACT 7X60 (BALLOONS) IMPLANT
KIT ENCORE 26 ADVANTAGE (KITS) IMPLANT
KIT MICROPUNCTURE NIT STIFF (SHEATH) IMPLANT
PROTECTION STATION PRESSURIZED (MISCELLANEOUS) ×2
SHEATH PINNACLE R/O II 7F 4CM (SHEATH) IMPLANT
SHEATH PROBE COVER 6X72 (BAG) ×2 IMPLANT
STATION PROTECTION PRESSURIZED (MISCELLANEOUS) ×2 IMPLANT
STOPCOCK MORSE 400PSI 3WAY (MISCELLANEOUS) ×2 IMPLANT
TRAY PV CATH (CUSTOM PROCEDURE TRAY) ×2 IMPLANT
TUBING CIL FLEX 10 FLL-RA (TUBING) ×2 IMPLANT
WIRE ROSEN-J .035X260CM (WIRE) IMPLANT
WIRE STARTER BENTSON 035X150 (WIRE) IMPLANT

## 2022-07-10 NOTE — H&P (Signed)
History and Physical Interval Note:  07/10/2022 10:11 AM  Kendra Clay  has presented today for surgery, with the diagnosis of instage renal.  The various methods of treatment have been discussed with the patient and family. After consideration of risks, benefits and other options for treatment, the patient has consented to  Procedure(s): A/V Fistulagram (Right) as a surgical intervention.  The patient's history has been reviewed, patient examined, no change in status, stable for surgery.  I have reviewed the patient's chart and labs.  Questions were answered to the patient's satisfaction.     Cephus Shelling     ASSESSMENT & PLAN    END-STAGE RENAL DISEASE: This patient has an aneurysmal right forearm AV fistula which they have had for many years.  The fistula is pulsatile.  Before scheduling plication of her aneurysm I have recommended that we proceed with a fistulogram to rule out any outflow stenosis.  She dialyzes on Monday Wednesdays and Fridays and will schedule this on a Tuesday or Thursday.  Once this is complete she can be scheduled for me to ligate her fistula on a Tuesday.  She is not on any blood thinners.  She does not have a pacemaker.  I have discussed the indications for the procedure and the potential complications and she is agreeable to proceed.  She understands we find disease amenable to angioplasty this would be done at the same time.   REASON FOR CONSULT:     Aneurysm of AV fistula.  The consult is requested by Dr. Glenna Fellows.    HPI:    Kendra Clay is a 62 y.o. female who had a right forearm AV fistula placed about 10 years ago.  The fistula has been working well although it has become markedly aneurysmal.  She was referred because of some thinning of the skin overlying the central segment of the fistula in her forearm.  She states that the fistula has been working well although occasionally depending upon where they stick it, she may have some bleeding  issues.   She dialyzes on Monday Wednesdays and Fridays.  She has not had access in the left arm.       Past Medical History:  Diagnosis Date   Complication of anesthesia     Enlarged liver      secondary to PKD   ESRD on hemodialysis (HCC)      MWF [place]   Heart murmur      "slight" per ? Dr Arlyce Dice 30 years ago. 2D ECHO  30 yearsa go.   History of blood transfusion      C- Section   History of bronchitis      numerous, last time> 1 year   Hypertension     Night muscle spasms      legs   Polycystic kidney disease      Genetic dx 23 years ago   PONV (postoperative nausea and vomiting)      patch helped   Scoliosis     Strabismus             Family History  Problem Relation Age of Onset   Arthritis Mother     Kidney disease Father     Polycystic kidney disease Son     Asthma Son     Hypertension Maternal Grandmother        SOCIAL HISTORY: Social History         Tobacco Use   Smoking status: Former  Packs/day: 1.50      Years: 30.00      Additional pack years: 0.00      Total pack years: 45.00      Types: Cigarettes      Start date: 18      Quit date: 01/10/2015      Years since quitting: 7.4   Smokeless tobacco: Former      Quit date: 01/09/2014  Substance Use Topics   Alcohol use: No      Alcohol/week: 0.0 standard drinks of alcohol           Allergies  Allergen Reactions   E-Mycin [Erythromycin] Hives   Sulfa Antibiotics Rash      Other reaction(s): Skin Rash Other reaction(s): Skin Rash     Lisinopril Cough            Current Outpatient Medications  Medication Sig Dispense Refill   acetaminophen (TYLENOL) 325 MG tablet Take 1-2 tablets (325-650 mg total) by mouth every 4 (four) hours as needed for mild pain.       atorvastatin (LIPITOR) 40 MG tablet Take 40 mg by mouth at bedtime.       B Complex-C-Zn-Folic Acid (DIALYVITE 800 WITH ZINC) 0.8 MG TABS Take 1 tablet by mouth daily.       carvedilol (COREG) 12.5 MG tablet Take 12.5 mg  by mouth 2 (two) times daily.       cinacalcet (SENSIPAR) 30 MG tablet Take 30 mg by mouth daily with supper.       Darbepoetin Alfa (ARANESP) 150 MCG/0.3ML SOSY injection Inject 0.3 mLs (150 mcg total) into the vein every Wednesday with hemodialysis. 1.68 mL     dextromethorphan-guaiFENesin (MUCINEX DM) 30-600 MG 12hr tablet Take 2 tablets by mouth daily as needed for cough.       doxercalciferol (HECTOROL) 4 MCG/2ML injection Inject 4 mLs (8 mcg total) into the vein every Monday, Wednesday, and Friday with hemodialysis. 2 mL     ferric citrate (AURYXIA) 1 GM 210 MG(Fe) tablet Take 210 mg by mouth 3 (three) times daily with meals.       isosorbide mononitrate (IMDUR) 30 MG 24 hr tablet Take 30 mg by mouth daily.       lidocaine (LIDODERM) 5 % Place 1 patch onto the skin daily. Remove & Discard patch within 12 hours or as directed by MD (Patient taking differently: Place 1 patch onto the skin daily as needed (pain).) 20 patch 0   loperamide (IMODIUM A-D) 2 MG tablet Take 2 mg by mouth daily as needed for diarrhea or loose stools.       loratadine (CLARITIN) 10 MG tablet Take 10 mg by mouth daily as needed for allergies.       losartan (COZAAR) 25 MG tablet Take 25 mg by mouth daily.       methocarbamol (ROBAXIN) 500 MG tablet Take 1 tablet (500 mg total) by mouth every 6 (six) hours as needed for muscle spasms. 10 tablet 0   midodrine (PROAMATINE) 5 MG tablet Take 1 tablet (5 mg total) by mouth as needed (for hypotension during dialysis).       mupirocin ointment (BACTROBAN) 2 % Apply topically daily. Apply Bactroban to right calf wound Q day, then cover with foam dressing.  (Change foam dressing Q 3 days or PRN soiling.) 22 g 0   nitroGLYCERIN (NITROSTAT) 0.4 MG SL tablet Place 0.4 mg under the tongue every 5 (five) minutes as needed for chest pain.  ondansetron (ZOFRAN) 4 MG tablet Take 1 tablet (4 mg total) by mouth every 6 (six) hours as needed for nausea. 20 tablet 0   pantoprazole  (PROTONIX) 40 MG tablet Take 40 mg by mouth 2 (two) times daily.       polyethylene glycol powder (MIRALAX) powder Start taking 1 capful 3 times a day. Slowly cut back as needed until you have normal bowel movements. (Patient taking differently: Take 17 g by mouth daily as needed for mild constipation.) 255 g 0   Vitamin D, Ergocalciferol, (DRISDOL) 1.25 MG (50000 UNIT) CAPS capsule Take 1 capsule (50,000 Units total) by mouth every 7 (seven) days. 3 capsule 0   oxyCODONE (OXY IR/ROXICODONE) 5 MG immediate release tablet Take 1 tablet (5 mg total) by mouth every 8 (eight) hours as needed for severe pain. (Patient not taking: Reported on 06/26/2022) 21 tablet 0    No current facility-administered medications for this visit.      REVIEW OF SYSTEMS:  [X]  denotes positive finding, [ ]  denotes negative finding Cardiac   Comments:  Chest pain or chest pressure:      Shortness of breath upon exertion:      Short of breath when lying flat:      Irregular heart rhythm:             Vascular      Pain in calf, thigh, or hip brought on by ambulation:      Pain in feet at night that wakes you up from your sleep:       Blood clot in your veins:      Leg swelling:              Pulmonary      Oxygen at home:      Productive cough:       Wheezing:              Neurologic      Sudden weakness in arms or legs:       Sudden numbness in arms or legs:       Sudden onset of difficulty speaking or slurred speech:      Temporary loss of vision in one eye:       Problems with dizziness:              Gastrointestinal      Blood in stool:       Vomited blood:              Genitourinary      Burning when urinating:       Blood in urine:             Psychiatric      Major depression:              Hematologic      Bleeding problems:      Problems with blood clotting too easily:             Skin      Rashes or ulcers:             Constitutional      Fever or chills:      -   PHYSICAL EXAM:        Vitals:    06/26/22 1109  BP: (!) 165/84  Pulse: 65  Resp: 20  Temp: 98.3 F (36.8 C)  SpO2: 98%  Weight: 126 lb (57.2 kg)  Height: 5\' 3"  (1.6 m)  Body mass index is 22.32 kg/m. GENERAL: The patient is a well-nourished female, in no acute distress. The vital signs are documented above. CARDIAC: There is a regular rate and rhythm.  VASCULAR: She has palpable radial pulses. The fistula is pulsatile.       PULMONARY: There is good air exchange bilaterally without wheezing or rales. MUSCULOSKELETAL: There are no major deformities. NEUROLOGIC: No focal weakness or paresthesias are detected. SKIN: There are no ulcers or rashes noted. PSYCHIATRIC: The patient has a normal affect.   DATA:     No new data     Waverly Ferrari [facility]

## 2022-07-10 NOTE — Op Note (Signed)
    OPERATIVE NOTE   DATE: July 10, 2022  PROCEDURE: right radiocephalic arteriovenous fistula cannulation under ultrasound guidance right arm fistulogram including central venogram right cephalic arch angioplasty (6 mm x 60 mm Mustang and 7 mm x 60 mm drug-coated Impact)  PRE-OPERATIVE DIAGNOSIS: Malfunctioning right foreman arteriovenous fistula  POST-OPERATIVE DIAGNOSIS: same as above   SURGEON: Cephus Shelling, MD  ANESTHESIA: local  ESTIMATED BLOOD LOSS: 5 cc  FINDING(S): The right forearm radiocephalic fistula was widely patent in the forearm and upper arm except for in-stent stenosis of a stent in the cephalic arch where there was a 50% in-stent stenosis and some adjacent disease just proximal to the stent.  This was treated with a 6 mm Mustang and 7 mm drug-coated Impact.  Excellent results.  Widely patent fistula at completion.  SPECIMEN(S):  None  CONTRAST: 70 mL  INDICATIONS: Kendra Clay is a 62 y.o. female who  presents with malfunctioning right forearm arteriovenous fistula.  The patient is scheduled for right upper extremity fistulogram.  The patient is aware the risks include but are not limited to: bleeding, infection, thrombosis of the cannulated access, and possible anaphylactic reaction to the contrast.  The patient is aware of the risks of the procedure and elects to proceed forward.  DESCRIPTION: After full informed written consent was obtained, the patient was brought back to the angiography suite and placed supine upon the angiography table.  The patient was connected to monitoring equipment.  The right forearm was prepped and draped in the standard fashion for a right arm fistulogram.  Under ultrasound guidance, the right forearm arteriovenous fistula was evaluated, it was patent, an imaged was saved.  It was cannulated with a micropuncture needle.  The microwire was advanced into the fistula and the needle was exchanged for the a microsheath, which  was lodged 2 cm into the access.  The wire was removed and the sheath was connected to the IV extension tubing.  Hand injections were completed to image the access from the forearm up to the level of axilla.  The central venous structures were also imaged by hand injections.  Ultimately elected for intervention as she had an in-stent stenosis of the cephalic arch stent.  I used a Bentson wire and upsized to a 7 [place] short sheath in the forearm fistula.  Patient was given 5000 units of IV heparin.  I then went and crossed the lesion and this was treated with a 6 mm x 60 mm Mustang for 2 minutes and then a 7 mm x 60 mm drug-coated Impact for 3 minutes.  Widely patent stent at completion.  A 4-0 Monocryl purse-string suture was sewn around the sheath.  The sheath was removed while tying down the suture.  A sterile bandage was applied to the puncture site.  Good thrill at completion.    COMPLICATIONS: None  CONDITION: Stable  Cephus Shelling, MD [facility] Office: [phone]  Cephus Shelling   07/10/2022 11:20 AM

## 2022-07-10 NOTE — Progress Notes (Signed)
Patient was given discharge instructions. She verbalized understanding. 

## 2022-07-11 ENCOUNTER — Encounter (HOSPITAL_COMMUNITY): Payer: Self-pay | Admitting: Vascular Surgery

## 2022-07-11 MED FILL — Midazolam HCl Inj 2 MG/2ML (Base Equivalent): INTRAMUSCULAR | Qty: 2 | Status: AC

## 2022-07-11 MED FILL — Fentanyl Citrate Preservative Free (PF) Inj 100 MCG/2ML: INTRAMUSCULAR | Qty: 2 | Status: AC

## 2022-07-15 ENCOUNTER — Ambulatory Visit
Admission: RE | Admit: 2022-07-15 | Discharge: 2022-07-15 | Disposition: A | Discharge status: Home / Self Care | Payer: Medicare Other | Source: Ambulatory Visit | Attending: Student | Admitting: Student

## 2022-07-15 DIAGNOSIS — M810 Age-related osteoporosis without current pathological fracture: Secondary | ICD-10-CM

## 2022-07-18 ENCOUNTER — Other Ambulatory Visit: Payer: Self-pay | Admitting: Acute Care

## 2022-07-18 DIAGNOSIS — Z122 Encounter for screening for malignant neoplasm of respiratory organs: Secondary | ICD-10-CM

## 2022-07-18 DIAGNOSIS — Z87891 Personal history of nicotine dependence: Secondary | ICD-10-CM

## 2022-08-20 ENCOUNTER — Other Ambulatory Visit: Payer: Medicare Other

## 2022-08-28 ENCOUNTER — Ambulatory Visit
Admission: RE | Admit: 2022-08-28 | Discharge: 2022-08-28 | Disposition: A | Discharge status: Home / Self Care | Payer: Medicare Other | Source: Ambulatory Visit | Attending: Acute Care | Admitting: Acute Care

## 2022-08-28 DIAGNOSIS — Z122 Encounter for screening for malignant neoplasm of respiratory organs: Secondary | ICD-10-CM

## 2022-08-28 DIAGNOSIS — Z87891 Personal history of nicotine dependence: Secondary | ICD-10-CM

## 2022-09-08 ENCOUNTER — Other Ambulatory Visit: Payer: Self-pay | Admitting: Acute Care

## 2022-09-08 DIAGNOSIS — Z122 Encounter for screening for malignant neoplasm of respiratory organs: Secondary | ICD-10-CM

## 2022-09-08 DIAGNOSIS — Z87891 Personal history of nicotine dependence: Secondary | ICD-10-CM

## 2022-12-29 ENCOUNTER — Emergency Department (HOSPITAL_COMMUNITY): Payer: Medicare Other

## 2022-12-29 ENCOUNTER — Emergency Department (HOSPITAL_COMMUNITY)
Admission: EM | Admit: 2022-12-29 | Discharge: 2022-12-29 | Disposition: A | Discharge status: Home / Self Care | Payer: Medicare Other | Attending: Emergency Medicine | Admitting: Emergency Medicine

## 2022-12-29 ENCOUNTER — Other Ambulatory Visit: Payer: Self-pay

## 2022-12-29 DIAGNOSIS — Z992 Dependence on renal dialysis: Secondary | ICD-10-CM | POA: Diagnosis not present

## 2022-12-29 DIAGNOSIS — Q446 Cystic disease of liver: Secondary | ICD-10-CM | POA: Diagnosis not present

## 2022-12-29 DIAGNOSIS — I120 Hypertensive chronic kidney disease with stage 5 chronic kidney disease or end stage renal disease: Secondary | ICD-10-CM | POA: Insufficient documentation

## 2022-12-29 DIAGNOSIS — K7689 Other specified diseases of liver: Secondary | ICD-10-CM | POA: Insufficient documentation

## 2022-12-29 DIAGNOSIS — D7389 Other diseases of spleen: Secondary | ICD-10-CM | POA: Insufficient documentation

## 2022-12-29 DIAGNOSIS — R0789 Other chest pain: Secondary | ICD-10-CM

## 2022-12-29 DIAGNOSIS — I700 Atherosclerosis of aorta: Secondary | ICD-10-CM | POA: Diagnosis not present

## 2022-12-29 DIAGNOSIS — R112 Nausea with vomiting, unspecified: Secondary | ICD-10-CM | POA: Diagnosis not present

## 2022-12-29 DIAGNOSIS — R12 Heartburn: Secondary | ICD-10-CM | POA: Diagnosis present

## 2022-12-29 DIAGNOSIS — Q613 Polycystic kidney, unspecified: Secondary | ICD-10-CM | POA: Insufficient documentation

## 2022-12-29 DIAGNOSIS — Z79899 Other long term (current) drug therapy: Secondary | ICD-10-CM | POA: Insufficient documentation

## 2022-12-29 DIAGNOSIS — N186 End stage renal disease: Secondary | ICD-10-CM | POA: Insufficient documentation

## 2022-12-29 DIAGNOSIS — Z1152 Encounter for screening for COVID-19: Secondary | ICD-10-CM | POA: Insufficient documentation

## 2022-12-29 DIAGNOSIS — K769 Liver disease, unspecified: Secondary | ICD-10-CM

## 2022-12-29 DIAGNOSIS — R079 Chest pain, unspecified: Secondary | ICD-10-CM | POA: Diagnosis not present

## 2022-12-29 DIAGNOSIS — R0600 Dyspnea, unspecified: Secondary | ICD-10-CM | POA: Insufficient documentation

## 2022-12-29 DIAGNOSIS — R0602 Shortness of breath: Secondary | ICD-10-CM | POA: Insufficient documentation

## 2022-12-29 DIAGNOSIS — K30 Functional dyspepsia: Secondary | ICD-10-CM | POA: Diagnosis not present

## 2022-12-29 DIAGNOSIS — I132 Hypertensive heart and chronic kidney disease with heart failure and with stage 5 chronic kidney disease, or end stage renal disease: Secondary | ICD-10-CM | POA: Diagnosis not present

## 2022-12-29 LAB — CBC WITH DIFFERENTIAL/PLATELET
Abs Immature Granulocytes: 0.03 10*3/uL (ref 0.00–0.07)
Basophils Absolute: 0 10*3/uL (ref 0.0–0.1)
Basophils Relative: 1 %
Eosinophils Absolute: 0.1 10*3/uL (ref 0.0–0.5)
Eosinophils Relative: 1 %
HCT: 32.7 % — ABNORMAL LOW (ref 36.0–46.0)
Hemoglobin: 10.4 g/dL — ABNORMAL LOW (ref 12.0–15.0)
Immature Granulocytes: 1 %
Lymphocytes Relative: 18 %
Lymphs Abs: 0.8 10*3/uL (ref 0.7–4.0)
MCH: 28.7 pg (ref 26.0–34.0)
MCHC: 31.8 g/dL (ref 30.0–36.0)
MCV: 90.3 fL (ref 80.0–100.0)
Monocytes Absolute: 0.3 10*3/uL (ref 0.1–1.0)
Monocytes Relative: 6 %
Neutro Abs: 3.3 10*3/uL (ref 1.7–7.7)
Neutrophils Relative %: 73 %
Platelets: 83 10*3/uL — ABNORMAL LOW (ref 150–400)
RBC: 3.62 MIL/uL — ABNORMAL LOW (ref 3.87–5.11)
RDW: 14.6 % (ref 11.5–15.5)
WBC: 4.5 10*3/uL (ref 4.0–10.5)
nRBC: 0 % (ref 0.0–0.2)

## 2022-12-29 LAB — RESP PANEL BY RT-PCR (RSV, FLU A&B, COVID)  RVPGX2
Influenza A by PCR: NEGATIVE
Influenza B by PCR: NEGATIVE
Resp Syncytial Virus by PCR: NEGATIVE
SARS Coronavirus 2 by RT PCR: NEGATIVE

## 2022-12-29 LAB — LIPASE, BLOOD: Lipase: 31 U/L (ref 11–51)

## 2022-12-29 LAB — COMPREHENSIVE METABOLIC PANEL
ALT: 6 U/L (ref 0–44)
AST: 13 U/L — ABNORMAL LOW (ref 15–41)
Albumin: 3.3 g/dL — ABNORMAL LOW (ref 3.5–5.0)
Alkaline Phosphatase: 106 U/L (ref 38–126)
Anion gap: 12 (ref 5–15)
BUN: 12 mg/dL (ref 8–23)
CO2: 26 mmol/L (ref 22–32)
Calcium: 9.2 mg/dL (ref 8.9–10.3)
Chloride: 92 mmol/L — ABNORMAL LOW (ref 98–111)
Creatinine, Ser: 4.02 mg/dL — ABNORMAL HIGH (ref 0.44–1.00)
GFR, Estimated: 12 mL/min — ABNORMAL LOW (ref >60)
Glucose, Bld: 92 mg/dL (ref 70–99)
Potassium: 3.4 mmol/L — ABNORMAL LOW (ref 3.5–5.1)
Sodium: 130 mmol/L — ABNORMAL LOW (ref 135–145)
Total Bilirubin: 1.8 mg/dL — ABNORMAL HIGH (ref <1.2)
Total Protein: 7.2 g/dL (ref 6.5–8.1)

## 2022-12-29 LAB — TROPONIN I (HIGH SENSITIVITY)
Troponin I (High Sensitivity): 18 ng/L — ABNORMAL HIGH (ref <18)
Troponin I (High Sensitivity): 23 ng/L — ABNORMAL HIGH (ref <18)

## 2022-12-29 LAB — BRAIN NATRIURETIC PEPTIDE: B Natriuretic Peptide: 2023.8 pg/mL — ABNORMAL HIGH (ref 0.0–100.0)

## 2022-12-29 MED ORDER — ALUM & MAG HYDROXIDE-SIMETH 200-200-20 MG/5ML PO SUSP
30.0000 mL | Freq: Once | ORAL | Status: AC
  Administered 2022-12-29: 30 mL via ORAL
  Filled 2022-12-29: qty 30

## 2022-12-29 NOTE — ED Notes (Signed)
Spoke with Kendra Clay in the lab and Bnp will be added and she states the troponin has been received and is running.

## 2022-12-29 NOTE — ED Provider Notes (Signed)
[facility] Provider Note   CSN: [id number] Arrival date & time: 12/29/22  1052     History  Chief Complaint  Patient presents with   Shortness of Breath    Kendra Clay is a 62 y.o. female.  HPI     62 year old female with a history of polycystic kidney disease, ESRD on dialysis Monday Wednesday Friday who receive dialysis today, hypertension, who presents with concern for "heartburn" starting yesterday with nausea and vomiting.  Presents with concern for sensation of heartburn that began around 1 PM yesterday.  Describes sensation of burning in the chest with frequent burping and discomfort that has been constant since yesterday.  Had difficulty sleeping last night due to this feeling of discomfort.  Has had nausea and vomiting.  Also reports shortness of breath over the last week, with mild cough, mild congestion although is not sure if this is secondary to allergies.  She had diarrhea last Wednesday as well as this morning.  She is still passing flatus.  She feels her abdomen might be a little bit more distended and more uncomfortable than usual, but reports is not unusual on dialysis days for her to have some abdominal discomfort.  She completed dialysis this morning.  She denies any fever, chills.  Does have hx of CHF but is not on lasix, noting she makes very little urine at this point.  She has stress test scheduled with Cardiology Dec 3rd.  Reports she was told she had some chronic lung disease but was released from pulmonology     Past Medical History:  Diagnosis Date   Complication of anesthesia    Enlarged liver    secondary to PKD   ESRD on hemodialysis (HCC)    MWF [facility]   Heart murmur    "slight" per ? Dr Arlyce Dice 30 years ago. 2D ECHO  30 yearsa go.   History of blood transfusion    C- Section   History of bronchitis    numerous, last time> 1 year   Hypertension    Night muscle spasms    legs   Polycystic  kidney disease    Genetic dx 23 years ago   PONV (postoperative nausea and vomiting)    patch helped   Scoliosis    Strabismus     Home Medications Prior to Admission medications   Medication Sig Start Date End Date Taking? Authorizing Provider  acetaminophen (TYLENOL) 500 MG tablet Take 500 mg by mouth every 8 (eight) hours as needed for moderate pain. No more than 3 a day    [provider]  atorvastatin (LIPITOR) 40 MG tablet Take 40 mg by mouth at bedtime.    [provider]  B Complex-C-Zn-Folic Acid (DIALYVITE 800 WITH ZINC) 0.8 MG TABS Take 1 tablet by mouth daily. 09/27/21   [provider]  Carboxymethylcell-Glycerin PF (REFRESH RELIEVA PF) 0.5-1 % SOLN Place 1 drop into both eyes as needed (dry eyes).    [provider]  carvedilol (COREG) 12.5 MG tablet Take 12.5 mg by mouth 2 (two) times daily. 10/09/21   [provider]  cinacalcet (SENSIPAR) 30 MG tablet Take 30 mg by mouth daily with supper.    [provider]  Darbepoetin Alfa (ARANESP) 150 MCG/0.3ML SOSY injection Inject 0.3 mLs (150 mcg total) into the vein every Wednesday with hemodialysis. 09/25/21   Setzer, Lynnell Jude, PA-C  doxercalciferol (HECTOROL) 4 MCG/2ML injection Inject 4 mLs (8 mcg total) into  the vein every Monday, Wednesday, and Friday with hemodialysis. 09/25/21   Setzer, Lynnell Jude, PA-C  ferric citrate (AURYXIA) 1 GM 210 MG(Fe) tablet Take 210 mg by mouth 3 (three) times daily with meals.    [provider]  fluticasone (FLONASE) 50 MCG/ACT nasal spray Place 1 spray into both nostrils daily as needed for allergies or rhinitis.    [provider]  isosorbide mononitrate (IMDUR) 30 MG 24 hr tablet Take 30 mg by mouth daily. 10/10/21   [provider]  loperamide (IMODIUM A-D) 2 MG tablet Take 2 mg by mouth daily as needed for diarrhea or loose stools.    [provider]  loratadine (CLARITIN) 10 MG tablet Take 10 mg by mouth daily as  needed for allergies.    [provider]  nitroGLYCERIN (NITROSTAT) 0.4 MG SL tablet Place 0.4 mg under the tongue every 5 (five) minutes as needed for chest pain.    [provider]  ondansetron (ZOFRAN) 4 MG tablet Take 1 tablet (4 mg total) by mouth every 6 (six) hours as needed for nausea. 10/15/21   Marguerita Merles Latif, DO  pantoprazole (PROTONIX) 40 MG tablet Take 40 mg by mouth 2 (two) times daily. 02/26/20   [provider]  polyethylene glycol powder (MIRALAX) powder Start taking 1 capful 3 times a day. Slowly cut back as needed until you have normal bowel movements. Patient taking differently: Take 17 g by mouth daily as needed for mild constipation. 08/17/17   Cardama, Amadeo Garnet, MD      Allergies    E-mycin [erythromycin], Sulfa antibiotics, and Lisinopril    Review of Systems   Review of Systems  Physical Exam Updated Vital Signs BP (!) 177/82   Pulse 76   Temp 97.8 F (36.6 C)   Resp (!) 24   Ht 5\' 3"  (1.6 m)   Wt 60.8 kg   SpO2 92%   BMI 23.74 kg/m  Physical Exam Vitals and nursing note reviewed.  Constitutional:      General: She is not in acute distress.    Appearance: She is well-developed. She is not diaphoretic.  HENT:     Head: Normocephalic and atraumatic.  Eyes:     Conjunctiva/sclera: Conjunctivae normal.  Cardiovascular:     Rate and Rhythm: Normal rate and regular rhythm.     Heart sounds: Normal heart sounds. No murmur heard.    No friction rub. No gallop.  Pulmonary:     Effort: Pulmonary effort is normal. No respiratory distress.     Breath sounds: Normal breath sounds. No wheezing or rales.  Abdominal:     General: There is no distension.     Palpations: Abdomen is soft.     Tenderness: There is no abdominal tenderness. There is no guarding.  Musculoskeletal:        General: No tenderness.     Cervical back: Normal range of motion.  Skin:    General: Skin is warm and dry.     Findings: No erythema or rash.   Neurological:     Mental Status: She is alert and oriented to person, place, and time.     ED Results / Procedures / Treatments   Labs (all labs ordered are listed, but only abnormal results are displayed) Labs Reviewed  CBC WITH DIFFERENTIAL/PLATELET - Abnormal; Notable for the following components:      Result Value   RBC 3.62 (*)    Hemoglobin 10.4 (*)    HCT  32.7 (*)    Platelets 83 (*)    All other components within normal limits  COMPREHENSIVE METABOLIC PANEL - Abnormal; Notable for the following components:   Sodium 130 (*)    Potassium 3.4 (*)    Chloride 92 (*)    Creatinine, Ser 4.02 (*)    Albumin 3.3 (*)    AST 13 (*)    Total Bilirubin 1.8 (*)    GFR, Estimated 12 (*)    All other components within normal limits  BRAIN NATRIURETIC PEPTIDE - Abnormal; Notable for the following components:   B Natriuretic Peptide 2,023.8 (*)    All other components within normal limits  TROPONIN I (HIGH SENSITIVITY) - Abnormal; Notable for the following components:   Troponin I (High Sensitivity) 18 (*)    All other components within normal limits  TROPONIN I (HIGH SENSITIVITY) - Abnormal; Notable for the following components:   Troponin I (High Sensitivity) 23 (*)    All other components within normal limits  RESP PANEL BY RT-PCR (RSV, FLU A&B, COVID)  RVPGX2  LIPASE, BLOOD    EKG EKG Interpretation Date/Time:  Monday December 29 2022 11:01:24 EST Ventricular Rate:  78 PR Interval:  90 QRS Duration:  157 QT Interval:  505 QTC Calculation: 576 R Axis:   92  Text Interpretation: Sinus or ectopic atrial rhythm Short PR interval RBBB and LPFB ST depr, consider ischemia, anterolateral lds Since prior ECG, rate has decreased Confirmed by Alvira Monday ([id number]) on 12/29/2022 11:02:51 PM  Radiology CT ABDOMEN PELVIS WO CONTRAST  Result Date: 12/29/2022 CLINICAL DATA:  Dyspnea. Right-sided pain. Dialysis patient. Concern for bowel obstruction. EXAM: CT ABDOMEN AND PELVIS  WITHOUT CONTRAST TECHNIQUE: Multidetector CT imaging of the abdomen and pelvis was performed following the standard protocol without IV contrast. RADIATION DOSE REDUCTION: This exam was performed according to the departmental dose-optimization program which includes automated exposure control, adjustment of the mA and/or kV according to patient size and/or use of iterative reconstruction technique. COMPARISON:  05/22/2020 CT abdomen/pelvis. FINDINGS: Lower chest: Trace layering right pleural effusion. Mild interlobular septal thickening and patchy ground-glass opacity at both lung bases, suggesting mild pulmonary edema. Mild cardiomegaly. Hepatobiliary: Top-normal liver size containing innumerable liver cysts measuring up to the 6.9 cm in the segment 7 right liver, several of which demonstrate coarse peripheral calcification. A peripherally calcified hypodense 2.2 cm far inferior right liver lesion (series 3/image 34) is indeterminate and not definitely visualized on 05/22/2020 comparison CT. Possible tiny layering calcified gallstone in the nondistended gallbladder. No gallbladder wall thickening or pericholecystic fluid. No biliary ductal dilatation. Pancreas: Normal, with no mass or duct dilation. Spleen: Normal size spleen. Hypodense 1.7 cm splenic lesion (series 3/image 17), increased from 0.9 cm on 05/22/2020 CT. No additional splenic lesions. Adrenals/Urinary Tract: Normal adrenals. Relatively symmetrically markedly enlarged kidneys bilaterally nearly completely replaced with innumerable confluent renal cysts, many of which demonstrate coarse heterogeneous peripheral calcifications, largest 6.0 cm in the anterior upper left kidney (series 3/image 24), without appreciable substantial change in the interval. No hydronephrosis. No definite renal stones. Normal caliber ureters, without ureteral stones. Normal nondistended bladder. Stomach/Bowel: Normal non-distended stomach. Normal caliber small bowel with no  small bowel wall thickening. Normal diminutive appendix. Normal large bowel with no diverticulosis, large bowel wall thickening or pericolonic fat stranding. Vascular/Lymphatic: Atherosclerotic nonaneurysmal abdominal aorta. No pathologically enlarged lymph nodes in the abdomen or pelvis. Reproductive: Status post hysterectomy, with no abnormal findings at the vaginal cuff. No adnexal mass. Other: No pneumoperitoneum,  ascites or focal fluid collection. Stable tiny fat containing right periumbilical hernia. Musculoskeletal: No aggressive appearing focal osseous lesions. IMPRESSION: 1. No acute abnormality. No evidence of bowel obstruction or acute bowel inflammation. 2. Chronic polycystic kidney disease. 3. Chronic cystic liver disease. Indeterminate peripherally calcified hypodense 2.2 cm far inferior right liver lesion, not definitely visualized on 05/22/2020 comparison CT. Indeterminate hypodense 1.7 cm splenic lesion, mildly increased from 0.9 cm on 05/22/2020 CT, probably benign given slow growth. Recommend nonemergent outpatient MRI abdomen without and with IV contrast for further characterization. 4. Possible tiny layering calcified gallstone in the nondistended gallbladder. No CT evidence of acute cholecystitis. 5. Trace layering right pleural effusion. Mild pulmonary edema at the lung bases. Mild cardiomegaly. 6.  Aortic Atherosclerosis (ICD10-I70.0). Electronically Signed   By: Delbert Phenix M.D.   On: 12/29/2022 14:36   DG Chest 2 View  Result Date: 12/29/2022 CLINICAL DATA:  Shortness of breath. EXAM: CHEST - 2 VIEW COMPARISON:  March 17, 2022. FINDINGS: Stable cardiomediastinal silhouette. Mild dextroscoliosis of upper thoracic spine is again noted. Mild interstitial densities are noted throughout both lungs which may represent diffuse interstitial chronic lung disease, but acute superimposed edema or atypical infection cannot be excluded. IMPRESSION: Mild diffuse interstitial densities are noted  bilaterally which may represent chronic interstitial lung disease, but acute superimposed edema or atypical inflammation cannot be excluded. Electronically Signed   By: Lupita Raider M.D.   On: 12/29/2022 12:30    Procedures Procedures    Medications Ordered in ED Medications  alum & mag hydroxide-simeth (MAALOX/MYLANTA) 200-200-20 MG/5ML suspension 30 mL (30 mLs Oral Given 12/29/22 1120)    ED Course/ Medical Decision Making/ A&P Clinical Course as of 12/29/22 2340  Mon Dec 29, 2022  1539 Troponin I (High Sensitivity)(!): 23 [KS]    Clinical Course User Index [KS] Cathren Laine, MD                                  62 year old female with a history of polycystic kidney disease, ESRD on dialysis Monday Wednesday Friday who receive dialysis today, hypertension, who presents with concern for "heartburn" starting yesterday with nausea and vomiting.  DDx includes ACS, SBO, pneumonia, pneumothorax, PE, reflux, viral infection.   EKG without significant changes compared to prior.   Given nausea, vomiting , abdominal distention, CT abdomen pelvis ordered to evaluate for SBO.  CT shows no acute abnormalities, oes show chronic kidney disease and cystic liver disease as well as tiny gallstone without evidence of cholecystitsi.  Trace pleural effusion, mild edema.  Recommend MRI abdomen for further evaluation of lesion on liver and spleen.  Has had dyspnea on exertion over last week-XR shows mild diffuse interstitial densities.  Hgb similar to prior values, mild hyponatremia. Lipase WNL and doubt pancreatitis. BNP 2000s in dialysis pt, previously greater than 4000.   Troponin 18 and 23 with only prior values from time of acute illness in 2017 when they were elevated.  Given dialysis pt, no significant increase in troponin, have low suspicoin this represents ACS.  She feels improved following GI cocktail.  Discussed it is reasonable to admit for expedited cardiac evaluation, however also feel she  is stable for outpatient follow up. No hypoxia, resting comfortably, CP improved after GI cocktail.  She has outpatient stress testing scheduled with her Cardiologist with [facility]. Recommend follow up with her Cardiologist and discussed reasons to return in detail.  Patient  discharged in stable condition with understanding of reasons to return.        Final Clinical Impression(s) / ED Diagnoses Final diagnoses:  Burning chest pain  Indigestion  Dyspnea, unspecified type  Nausea and vomiting, unspecified vomiting type  Liver lesion  Lesion of spleen    Rx / DC Orders ED Discharge Orders     None         Alvira Monday, MD 12/29/22 2341

## 2022-12-29 NOTE — ED Notes (Signed)
Patient transported to X-ray 

## 2022-12-29 NOTE — ED Triage Notes (Addendum)
Patient arrived by [facility] from home for ongoing exertional sob for the past week with cough and congestion, patient received full dialysis treatment prior to coming to ED. Patient also complains of right sided rib pain with movement and nausea, denies trauma. Alert and oriented. Also has been experiencing heartburn x 1 day. Has missed no dialysis treatments. Previous smoker years ago-

## 2023-01-14 NOTE — Progress Notes (Unsigned)
Established Dialysis Access   History of Present Illness   Kendra Clay is a 62 y.o. 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 She has history of right forearm AV fistula placed about 10 years ago.  The fistula has been working well although it has become markedly aneurysmal over time. She was last seen in May of this year at which time she was being evaluated for the same issue. She underwent Fistulogram because the fistula was pulsatile and she had recurrent in stent restenosis In her cephalic arch. She states that since the fistula has been working well. The aneurysm however she feels continues to get larger.    She dialyzes on Monday Wednesdays and Fridays.  She has not had access in the left arm.   Current Outpatient Medications  Medication Sig Dispense Refill   acetaminophen (TYLENOL) 500 MG tablet Take 500 mg by mouth every 8 (eight) hours as needed for moderate pain. No more than 3 a day     atorvastatin (LIPITOR) 40 MG tablet Take 40 mg by mouth at bedtime.     B Complex-C-Zn-Folic Acid (DIALYVITE 800 WITH ZINC) 0.8 MG TABS Take 1 tablet by mouth daily.     Carboxymethylcell-Glycerin PF (REFRESH RELIEVA PF) 0.5-1 % SOLN Place 1 drop into both eyes as needed (dry eyes).     carvedilol (COREG) 12.5 MG tablet Take 12.5 mg by mouth 2 (two) times daily.     cinacalcet (SENSIPAR) 30 MG tablet Take 30 mg by mouth daily with supper.     Darbepoetin Alfa (ARANESP) 150 MCG/0.3ML SOSY injection Inject 0.3 mLs (150 mcg total) into the vein every Wednesday with hemodialysis. 1.68 mL    doxercalciferol (HECTOROL) 4 MCG/2ML injection Inject 4 mLs (8 mcg total) into the vein every Monday, Wednesday, and Friday with hemodialysis. 2 mL    ferric citrate (AURYXIA) 1 GM 210 MG(Fe) tablet Take 210 mg by mouth 3 (three) times daily with meals.     fluticasone (FLONASE) 50 MCG/ACT nasal spray Place 1 spray into both nostrils daily as needed for  allergies or rhinitis.     isosorbide mononitrate (IMDUR) 30 MG 24 hr tablet Take 30 mg by mouth daily.     loperamide (IMODIUM A-D) 2 MG tablet Take 2 mg by mouth daily as needed for diarrhea or loose stools.     loratadine (CLARITIN) 10 MG tablet Take 10 mg by mouth daily as needed for allergies.     nitroGLYCERIN (NITROSTAT) 0.4 MG SL tablet Place 0.4 mg under the tongue every 5 (five) minutes as needed for chest pain.     ondansetron (ZOFRAN) 4 MG tablet Take 1 tablet (4 mg total) by mouth every 6 (six) hours as needed for nausea. 20 tablet 0   pantoprazole (PROTONIX) 40 MG tablet Take 40 mg by mouth 2 (two) times daily.     polyethylene glycol powder (MIRALAX) powder Start taking 1 capful 3 times a day. Slowly cut back as needed until you have normal bowel movements. (Patient taking differently: Take 17 g by mouth daily as needed for mild constipation.) 255 g 0   Current Facility-Administered Medications  Medication Dose Route Frequency Provider Last Rate Last Admin   0.9 %  sodium chloride infusion  250 mL Intravenous PRN Cephus Shelling, MD       sodium chloride flush (NS) 0.9 % injection 3 mL  3 mL Intravenous Q12H Cephus Shelling, MD  sodium chloride flush (NS) 0.9 % injection 3 mL  3 mL Intravenous PRN Cephus Shelling, MD        On ROS today: ***, ***   Physical Examination  There were no vitals filed for this visit. There is no height or weight on file to calculate BMI.  General {LOC:19197::"Somulent","Alert"}, {Orientation:19197::"Confused","O x 3"}, {Weight:19197::"Obese","Cachectic","WD"}, {General state of health:19197::"Ill appearing","Elderly","NAD"}  Pulmonary {Chest wall:19197::"Asx chest movement","Sym exp"}, {Air movt:19197::"Decreased *** air movt","good B air movt"}, {BS:19197::"rales on ***","rhonchi on ***","wheezing on ***","CTA B"}  Cardiac {Rhythm:19197::"Irregularly, irregular rate and rhythm","RRR, Nl S1, S2"}, {Murmur:19197::"Murmur present:  ***","no Murmurs"}, {Rubs:19197::"Rub present: ***","No rubs"}, {Gallop:19197::"Gallop present: ***","No S3,S4"}  Vascular Vessel Right Left  Radial {Palpable:19197::"Not palpable","Faintly palpable","Palpable"} {Palpable:19197::"Not palpable","Faintly palpable","Palpable"}  Brachial {Palpable:19197::"Not palpable","Faintly palpable","Palpable"} {Palpable:19197::"Not palpable","Faintly palpable","Palpable"}  Ulnar {Palpable:19197::"Not palpable","Faintly palpable","Palpable"} {Palpable:19197::"Not palpable","Faintly palpable","Palpable"}    Musculo- skeletal M/S 5/5 throughout {MS:19197::"except ***"," "}, Extremities without ischemic changes {MS:19197::"except ***"," "}  Neurologic A&O; CN grossly intact     Medical Decision Making   Kendra Clay is a 62 y.o. 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   ***   Kendra Congress, PA-C [facility] Office: [phone]  Clinic MD: ***

## 2023-01-15 ENCOUNTER — Ambulatory Visit (INDEPENDENT_AMBULATORY_CARE_PROVIDER_SITE_OTHER): Payer: Medicare Other

## 2023-01-15 VITALS — BP 155/90 | HR 80 | Temp 97.6°F | Resp 20 | Ht 63.0 in | Wt 131.6 lb

## 2023-01-15 DIAGNOSIS — N186 End stage renal disease: Secondary | ICD-10-CM | POA: Diagnosis not present

## 2023-01-15 DIAGNOSIS — Z992 Dependence on renal dialysis: Secondary | ICD-10-CM | POA: Diagnosis not present

## 2023-01-29 ENCOUNTER — Inpatient Hospital Stay (HOSPITAL_COMMUNITY)
Admission: EM | Admit: 2023-01-29 | Discharge: 2023-02-01 | DRG: 291 | Disposition: A | Discharge status: Home / Self Care | Payer: Medicare Other | Attending: Internal Medicine | Admitting: Internal Medicine

## 2023-01-29 ENCOUNTER — Emergency Department (HOSPITAL_COMMUNITY): Payer: Medicare Other

## 2023-01-29 ENCOUNTER — Encounter (HOSPITAL_COMMUNITY): Payer: Self-pay | Admitting: Student

## 2023-01-29 ENCOUNTER — Other Ambulatory Visit: Payer: Self-pay

## 2023-01-29 DIAGNOSIS — E860 Dehydration: Secondary | ICD-10-CM | POA: Diagnosis present

## 2023-01-29 DIAGNOSIS — R197 Diarrhea, unspecified: Secondary | ICD-10-CM | POA: Diagnosis present

## 2023-01-29 DIAGNOSIS — Z87891 Personal history of nicotine dependence: Secondary | ICD-10-CM | POA: Diagnosis not present

## 2023-01-29 DIAGNOSIS — I5042 Chronic combined systolic (congestive) and diastolic (congestive) heart failure: Secondary | ICD-10-CM | POA: Diagnosis present

## 2023-01-29 DIAGNOSIS — Z8261 Family history of arthritis: Secondary | ICD-10-CM

## 2023-01-29 DIAGNOSIS — Z888 Allergy status to other drugs, medicaments and biological substances status: Secondary | ICD-10-CM | POA: Diagnosis not present

## 2023-01-29 DIAGNOSIS — Z825 Family history of asthma and other chronic lower respiratory diseases: Secondary | ICD-10-CM | POA: Diagnosis not present

## 2023-01-29 DIAGNOSIS — Z8249 Family history of ischemic heart disease and other diseases of the circulatory system: Secondary | ICD-10-CM

## 2023-01-29 DIAGNOSIS — M898X9 Other specified disorders of bone, unspecified site: Secondary | ICD-10-CM | POA: Diagnosis present

## 2023-01-29 DIAGNOSIS — D631 Anemia in chronic kidney disease: Secondary | ICD-10-CM | POA: Diagnosis present

## 2023-01-29 DIAGNOSIS — E440 Moderate protein-calorie malnutrition: Secondary | ICD-10-CM | POA: Diagnosis present

## 2023-01-29 DIAGNOSIS — I132 Hypertensive heart and chronic kidney disease with heart failure and with stage 5 chronic kidney disease, or end stage renal disease: Secondary | ICD-10-CM | POA: Diagnosis present

## 2023-01-29 DIAGNOSIS — Z992 Dependence on renal dialysis: Secondary | ICD-10-CM

## 2023-01-29 DIAGNOSIS — Z79899 Other long term (current) drug therapy: Secondary | ICD-10-CM

## 2023-01-29 DIAGNOSIS — Z882 Allergy status to sulfonamides status: Secondary | ICD-10-CM

## 2023-01-29 DIAGNOSIS — Z8271 Family history of polycystic kidney: Secondary | ICD-10-CM

## 2023-01-29 DIAGNOSIS — Z881 Allergy status to other antibiotic agents status: Secondary | ICD-10-CM

## 2023-01-29 DIAGNOSIS — Z5982 Transportation insecurity: Secondary | ICD-10-CM | POA: Diagnosis not present

## 2023-01-29 DIAGNOSIS — R5383 Other fatigue: Secondary | ICD-10-CM

## 2023-01-29 DIAGNOSIS — E876 Hypokalemia: Secondary | ICD-10-CM | POA: Diagnosis present

## 2023-01-29 DIAGNOSIS — Z841 Family history of disorders of kidney and ureter: Secondary | ICD-10-CM

## 2023-01-29 DIAGNOSIS — N186 End stage renal disease: Secondary | ICD-10-CM | POA: Diagnosis present

## 2023-01-29 DIAGNOSIS — R531 Weakness: Secondary | ICD-10-CM | POA: Diagnosis present

## 2023-01-29 DIAGNOSIS — E785 Hyperlipidemia, unspecified: Secondary | ICD-10-CM | POA: Diagnosis present

## 2023-01-29 DIAGNOSIS — E559 Vitamin D deficiency, unspecified: Secondary | ICD-10-CM | POA: Diagnosis present

## 2023-01-29 DIAGNOSIS — N2581 Secondary hyperparathyroidism of renal origin: Secondary | ICD-10-CM | POA: Diagnosis present

## 2023-01-29 DIAGNOSIS — Z6821 Body mass index (BMI) 21.0-21.9, adult: Secondary | ICD-10-CM | POA: Diagnosis not present

## 2023-01-29 DIAGNOSIS — Z91158 Patient's noncompliance with renal dialysis for other reason: Secondary | ICD-10-CM

## 2023-01-29 LAB — CBC WITH DIFFERENTIAL/PLATELET
Abs Immature Granulocytes: 0.05 10*3/uL (ref 0.00–0.07)
Basophils Absolute: 0 10*3/uL (ref 0.0–0.1)
Basophils Relative: 1 %
Eosinophils Absolute: 0.1 10*3/uL (ref 0.0–0.5)
Eosinophils Relative: 1 %
HCT: 30.8 % — ABNORMAL LOW (ref 36.0–46.0)
Hemoglobin: 9.4 g/dL — ABNORMAL LOW (ref 12.0–15.0)
Immature Granulocytes: 1 %
Lymphocytes Relative: 23 %
Lymphs Abs: 1.2 10*3/uL (ref 0.7–4.0)
MCH: 27.8 pg (ref 26.0–34.0)
MCHC: 30.5 g/dL (ref 30.0–36.0)
MCV: 91.1 fL (ref 80.0–100.0)
Monocytes Absolute: 0.4 10*3/uL (ref 0.1–1.0)
Monocytes Relative: 7 %
Neutro Abs: 3.7 10*3/uL (ref 1.7–7.7)
Neutrophils Relative %: 67 %
Platelets: 124 10*3/uL — ABNORMAL LOW (ref 150–400)
RBC: 3.38 MIL/uL — ABNORMAL LOW (ref 3.87–5.11)
RDW: 15.5 % (ref 11.5–15.5)
WBC: 5.4 10*3/uL (ref 4.0–10.5)
nRBC: 0 % (ref 0.0–0.2)

## 2023-01-29 LAB — LIPASE, BLOOD: Lipase: 29 U/L (ref 11–51)

## 2023-01-29 LAB — COMPREHENSIVE METABOLIC PANEL
ALT: 6 U/L (ref 0–44)
AST: 11 U/L — ABNORMAL LOW (ref 15–41)
Albumin: 2.6 g/dL — ABNORMAL LOW (ref 3.5–5.0)
Alkaline Phosphatase: 85 U/L (ref 38–126)
Anion gap: 13 (ref 5–15)
BUN: 48 mg/dL — ABNORMAL HIGH (ref 8–23)
CO2: 27 mmol/L (ref 22–32)
Calcium: 8.7 mg/dL — ABNORMAL LOW (ref 8.9–10.3)
Chloride: 91 mmol/L — ABNORMAL LOW (ref 98–111)
Creatinine, Ser: 12.4 mg/dL — ABNORMAL HIGH (ref 0.44–1.00)
GFR, Estimated: 3 mL/min — ABNORMAL LOW (ref >60)
Glucose, Bld: 87 mg/dL (ref 70–99)
Potassium: 3 mmol/L — ABNORMAL LOW (ref 3.5–5.1)
Sodium: 131 mmol/L — ABNORMAL LOW (ref 135–145)
Total Bilirubin: 0.9 mg/dL (ref <1.2)
Total Protein: 6.9 g/dL (ref 6.5–8.1)

## 2023-01-29 LAB — TROPONIN I (HIGH SENSITIVITY)
Troponin I (High Sensitivity): 93 ng/L — ABNORMAL HIGH (ref <18)
Troponin I (High Sensitivity): 86 ng/L — ABNORMAL HIGH (ref <18)

## 2023-01-29 LAB — PHOSPHORUS: Phosphorus: 3.3 mg/dL (ref 2.5–4.6)

## 2023-01-29 LAB — MAGNESIUM: Magnesium: 1.7 mg/dL (ref 1.7–2.4)

## 2023-01-29 LAB — BRAIN NATRIURETIC PEPTIDE: B Natriuretic Peptide: 2180.5 pg/mL — ABNORMAL HIGH (ref 0.0–100.0)

## 2023-01-29 LAB — HEPATITIS B SURFACE ANTIGEN: Hepatitis B Surface Ag: NONREACTIVE

## 2023-01-29 MED ORDER — PROSOURCE PLUS PO LIQD
30.0000 mL | Freq: Two times a day (BID) | ORAL | Status: DC
  Filled 2023-01-29 (×2): qty 30

## 2023-01-29 MED ORDER — CINACALCET HCL 30 MG PO TABS
30.0000 mg | ORAL_TABLET | ORAL | Status: DC
  Administered 2023-01-30: 30 mg via ORAL
  Filled 2023-01-29: qty 1

## 2023-01-29 MED ORDER — DOXERCALCIFEROL 4 MCG/2ML IV SOLN
3.0000 ug | INTRAVENOUS | Status: DC
  Administered 2023-01-30: 3 ug via INTRAVENOUS
  Filled 2023-01-29 (×3): qty 2

## 2023-01-29 MED ORDER — ISOSORBIDE MONONITRATE ER 30 MG PO TB24
30.0000 mg | ORAL_TABLET | Freq: Every day | ORAL | Status: DC
  Administered 2023-01-29: 30 mg via ORAL
  Filled 2023-01-29: qty 1

## 2023-01-29 MED ORDER — ATORVASTATIN CALCIUM 40 MG PO TABS
40.0000 mg | ORAL_TABLET | Freq: Every day | ORAL | Status: DC
  Administered 2023-01-29 – 2023-01-31 (×3): 40 mg via ORAL
  Filled 2023-01-29 (×3): qty 1

## 2023-01-29 MED ORDER — FLUTICASONE PROPIONATE 50 MCG/ACT NA SUSP: 1.0000 | Freq: Every day | NASAL | Status: DC | PRN

## 2023-01-29 MED ORDER — SODIUM CHLORIDE 0.9 % IV SOLN
100.0000 mg | INTRAVENOUS | Status: DC
  Administered 2023-01-30: 100 mg via INTRAVENOUS
  Filled 2023-01-29: qty 5

## 2023-01-29 MED ORDER — FERRIC CITRATE 1 GM 210 MG(FE) PO TABS
210.0000 mg | ORAL_TABLET | Freq: Three times a day (TID) | ORAL | Status: DC
  Administered 2023-01-29 – 2023-02-01 (×7): 210 mg via ORAL
  Filled 2023-01-29 (×7): qty 1

## 2023-01-29 MED ORDER — POTASSIUM CHLORIDE CRYS ER 20 MEQ PO TBCR
40.0000 meq | EXTENDED_RELEASE_TABLET | Freq: Once | ORAL | Status: AC
  Administered 2023-01-29: 40 meq via ORAL
  Filled 2023-01-29: qty 2

## 2023-01-29 MED ORDER — PANTOPRAZOLE SODIUM 40 MG PO TBEC
40.0000 mg | DELAYED_RELEASE_TABLET | Freq: Two times a day (BID) | ORAL | Status: DC
  Administered 2023-01-29 – 2023-02-01 (×6): 40 mg via ORAL
  Filled 2023-01-29 (×6): qty 1

## 2023-01-29 MED ORDER — ONDANSETRON HCL 4 MG/2ML IJ SOLN: 4.0000 mg | Freq: Four times a day (QID) | INTRAMUSCULAR | Status: DC | PRN

## 2023-01-29 MED ORDER — SODIUM CHLORIDE 0.9 % IV BOLUS
250.0000 mL | Freq: Once | INTRAVENOUS | Status: DC
Start: 2023-01-29 — End: 2023-01-29

## 2023-01-29 MED ORDER — ORAL CARE MOUTH RINSE: 15.0000 mL | OROMUCOSAL | Status: DC | PRN

## 2023-01-29 MED ORDER — RENA-VITE PO TABS
1.0000 | ORAL_TABLET | Freq: Every day | ORAL | Status: DC
  Administered 2023-01-29 – 2023-01-31 (×3): 1 via ORAL
  Filled 2023-01-29 (×4): qty 1

## 2023-01-29 MED ORDER — HEPARIN SODIUM (PORCINE) 5000 UNIT/ML IJ SOLN
5000.0000 [IU] | Freq: Three times a day (TID) | INTRAMUSCULAR | Status: DC
  Administered 2023-01-29 – 2023-02-01 (×8): 5000 [IU] via SUBCUTANEOUS
  Filled 2023-01-29 (×8): qty 1

## 2023-01-29 MED ORDER — NEPRO/CARBSTEADY PO LIQD
237.0000 mL | Freq: Two times a day (BID) | ORAL | Status: DC
  Filled 2023-01-29: qty 237

## 2023-01-29 MED ORDER — LOSARTAN POTASSIUM 25 MG PO TABS
25.0000 mg | ORAL_TABLET | Freq: Every day | ORAL | Status: DC
  Administered 2023-01-29: 25 mg via ORAL
  Filled 2023-01-29: qty 1

## 2023-01-29 MED ORDER — ONDANSETRON HCL 4 MG PO TABS: 4.0000 mg | ORAL_TABLET | Freq: Four times a day (QID) | ORAL | Status: DC | PRN

## 2023-01-29 MED ORDER — POLYVINYL ALCOHOL 1.4 % OP SOLN: 1.0000 [drp] | OPHTHALMIC | Status: DC | PRN

## 2023-01-29 MED ORDER — CARVEDILOL 12.5 MG PO TABS
12.5000 mg | ORAL_TABLET | Freq: Two times a day (BID) | ORAL | Status: DC
  Administered 2023-01-29 – 2023-02-01 (×5): 12.5 mg via ORAL
  Filled 2023-01-29 (×5): qty 1

## 2023-01-29 NOTE — Plan of Care (Signed)

## 2023-01-29 NOTE — ED Provider Notes (Signed)
[place] EMERGENCY DEPARTMENT AT [facility] Provider Note   CSN: [id number] Arrival date & time: 01/29/23  1026     History  Chief Complaint  Patient presents with   Diarrhea   Fatigue    Kendra Clay is a 62 y.o. female.  HPI Patient reports she has had symptoms for about a week.  She has had diarrhea.  Up to 3 episodes per day.  Nonbloody.  No vomiting.  Patient reports significant loss of appetite over the past week.  She denies abdominal pain.  She reports a little bit "tender" no specific or localizing pain.  Patient typically gets dialysis Monday Wednesday Friday.  Due to holiday schedule, last dialysis was Sunday, 4 days ago.  Patient denies any swelling.  She does not feel significantly short of breath.  She reports she does feel however increased fatigue and getting more winded with exertion than baseline.  No fever.  She reports she went to [facility] 4 days ago.  Patient reports she had a CT scan done at that time without any reported specific abnormality.  She also reports she tested negative for COVID influenza and RSV.    Home Medications Prior to Admission medications   Medication Sig Start Date End Date Taking? Authorizing Provider  acetaminophen (TYLENOL) 500 MG tablet Take 500 mg by mouth every 8 (eight) hours as needed for moderate pain. No more than 3 a day   Yes [provider]  atorvastatin (LIPITOR) 40 MG tablet Take 40 mg by mouth at bedtime.   Yes [provider]  Carboxymethylcell-Glycerin PF (REFRESH RELIEVA PF) 0.5-1 % SOLN Place 1 drop into both eyes as needed (dry eyes).   Yes [provider]  carvedilol (COREG) 12.5 MG tablet Take 12.5 mg by mouth 2 (two) times daily. 10/09/21  Yes [provider]  Darbepoetin Alfa (ARANESP) 150 MCG/0.3ML SOSY injection Inject 0.3 mLs (150 mcg total) into the vein every Wednesday with hemodialysis. 09/25/21  Yes Setzer, Lynnell Jude, PA-C  doxercalciferol (HECTOROL) 4  MCG/2ML injection Inject 4 mLs (8 mcg total) into the vein every Monday, Wednesday, and Friday with hemodialysis. 09/25/21  Yes Setzer, Lynnell Jude, PA-C  ferric citrate (AURYXIA) 1 GM 210 MG(Fe) tablet Take 210 mg by mouth 3 (three) times daily with meals.   Yes [provider]  fluticasone (FLONASE) 50 MCG/ACT nasal spray Place 1 spray into both nostrils daily as needed for allergies or rhinitis.   Yes [provider]  isosorbide mononitrate (IMDUR) 30 MG 24 hr tablet Take 30 mg by mouth daily. 10/10/21  Yes [provider]  loperamide (IMODIUM A-D) 2 MG tablet Take 2 mg by mouth daily as needed for diarrhea or loose stools.   Yes [provider]  loratadine (CLARITIN) 10 MG tablet Take 10 mg by mouth daily as needed for allergies.   Yes [provider]  losartan (COZAAR) 25 MG tablet Take 25 mg by mouth daily. 09/10/22  Yes [provider]  nitroGLYCERIN (NITROSTAT) 0.4 MG SL tablet Place 0.4 mg under the tongue every 5 (five) minutes as needed for chest pain.   Yes [provider]  pantoprazole (PROTONIX) 40 MG tablet Take 40 mg by mouth 2 (two) times daily. 02/26/20  Yes [provider]  polyethylene glycol powder (MIRALAX) powder Start taking 1 capful 3 times a day. Slowly cut back as needed until you have normal bowel movements. Patient taking differently: Take 17 g by mouth daily as needed  for mild constipation. 08/17/17  Yes Cardama, Amadeo Garnet, MD  ondansetron (ZOFRAN) 4 MG tablet Take 1 tablet (4 mg total) by mouth every 6 (six) hours as needed for nausea. Patient not taking: Reported on 01/15/2023 10/15/21   Marguerita Merles Latif, DO      Allergies    E-mycin [erythromycin], Sulfa antibiotics, and Lisinopril    Review of Systems   Review of Systems  Physical Exam Updated Vital Signs BP (!) 164/78 (BP Location: Right Arm)   Pulse 74   Temp 98.3 F (36.8 C) (Oral)   Resp (!) 23   Ht 5\' 3"  (1.6 m)   Wt 55 kg   SpO2  98%   BMI 21.48 kg/m  Physical Exam Constitutional:      Comments: Alert with clear mental status.  Nontoxic.  No respiratory distress.  HENT:     Mouth/Throat:     Pharynx: Oropharynx is clear.  Eyes:     Extraocular Movements: Extraocular movements intact.  Cardiovascular:     Rate and Rhythm: Normal rate and regular rhythm.  Pulmonary:     Effort: Pulmonary effort is normal.     Breath sounds: Normal breath sounds.  Abdominal:     Comments: Soft.  Mildly protuberant.  Patient endorses mild discomfort to palpation nonlocalizing.  No palpable mass.  Musculoskeletal:        General: No swelling or tenderness. Normal range of motion.     Right lower leg: No edema.     Left lower leg: No edema.     Comments: No peripheral edema.  Calves are soft and pliable.  Skin:    General: Skin is warm and dry.     Comments: Mildly pale appearance.  Skin warm and dry.  Neurological:     General: No focal deficit present.     Mental Status: She is oriented to person, place, and time.     Comments: No focal weakness.  Movements are coordinated and purposeful.  Speech is clear.  Content normal.  Psychiatric:        Mood and Affect: Mood normal.     ED Results / Procedures / Treatments   Labs (all labs ordered are listed, but only abnormal results are displayed) Labs Reviewed  BRAIN NATRIURETIC PEPTIDE - Abnormal; Notable for the following components:      Result Value   B Natriuretic Peptide 2,180.5 (*)    All other components within normal limits  COMPREHENSIVE METABOLIC PANEL - Abnormal; Notable for the following components:   Sodium 131 (*)    Potassium 3.0 (*)    Chloride 91 (*)    BUN 48 (*)    Creatinine, Ser 12.40 (*)    Calcium 8.7 (*)    Albumin 2.6 (*)    AST 11 (*)    GFR, Estimated 3 (*)    All other components within normal limits  CBC WITH DIFFERENTIAL/PLATELET - Abnormal; Notable for the following components:   RBC 3.38 (*)    Hemoglobin 9.4 (*)    HCT 30.8 (*)     Platelets 124 (*)    All other components within normal limits  TROPONIN I (HIGH SENSITIVITY) - Abnormal; Notable for the following components:   Troponin I (High Sensitivity) 93 (*)    All other components within normal limits  TROPONIN I (HIGH SENSITIVITY) - Abnormal; Notable for the following components:   Troponin I (High Sensitivity) 86 (*)    All other components within normal limits  LIPASE,  BLOOD  MAGNESIUM  PHOSPHORUS    EKG EKG Interpretation Date/Time:  Thursday January 29 2023 10:35:47 EST Ventricular Rate:  63 PR Interval:  181 QRS Duration:  149 QT Interval:  494 QTC Calculation: 506 R Axis:   74  Text Interpretation: Sinus rhythm Ventricular premature complex Right bundle branch block previously seen inferior and lateral ST depressions resolved. PVC . lower voltage RBBB Confirmed by Arby Barrette [phone]) on 01/29/2023 12:06:46 PM  Radiology DG Chest Port 1 View Result Date: 01/29/2023 CLINICAL DATA:  62 year old female with diarrhea, lethargy, fatigue. Dialysis patient. EXAM: PORTABLE CHEST 1 VIEW COMPARISON:  Chest radiographs 12/29/2022 and earlier. FINDINGS: Portable AP semi upright view at 1212 hours. Stable cardiomegaly and mediastinal contours. Calcified aortic atherosclerosis. Levoconvex thoracic scoliosis and large lung volumes again noted. No pneumothorax, pulmonary edema, pleural effusion or confluent lung opacity. Visualized tracheal air column is within normal limits. Right subclavian vascular stent again noted. Paucity of bowel gas the visible abdomen. IMPRESSION: 1.  No acute cardiopulmonary abnormality. 2. Cardiomegaly.  Aortic Atherosclerosis (ICD10-I70.0). Electronically Signed   By: Odessa [place] M.D.   On: 01/29/2023 12:36    Procedures Procedures    Medications Ordered in ED Medications  potassium chloride SA (KLOR-CON M) CR tablet 40 mEq (40 mEq Oral Given 01/29/23 1548)    ED Course/ Medical Decision Making/ A&P                                  Medical Decision Making Amount and/or Complexity of Data Reviewed Labs: ordered. Radiology: ordered.  Risk Prescription drug management. Decision regarding hospitalization.   Presents as outlined.  She has significant comorbid condition of ESRD on dialysis.  Patient's symptoms are generalized with fatigue and loss of appetite associated with diarrhea.  No fever reported.  Clinically patient is nontoxic.  Will proceed with lab work to rule out electrolyte derangement\anemia\atypical MI.  Obtain chest x-ray to evaluate for volume overload or suspicion of pneumonia.  Currently patient is clinically stable with normal vital signs normal BP 144/71 heart rate is 68 and oxygen saturation 100% afebrile.  Labs reviewed potassium 3.0 BUN 48 creatinine 12.4 lipase 29 troponin 93 hemoglobin 9.4 white count 5.4  Consult: Reviewed with nephrology Dr. Glenna Fellows.  Dr. Glenna Fellows is familiar with the patient.  At this time with significant general weakness and for oral intake with volume loss, recommends admission to medical service and will assess for dialysis.  Consult: Reviewed with [facility] hospitalist for admission.        Final Clinical Impression(s) / ED Diagnoses Final diagnoses:  Dehydration  Other fatigue  Diarrhea, unspecified type  ESRD on dialysis [facility])  Hypokalemia    Rx / DC Orders ED Discharge Orders     None         Arby Barrette, MD 01/29/23 1556

## 2023-01-29 NOTE — Progress Notes (Signed)
New Admission Note:   Arrival Method: stretcher Mental Orientation: aa+ox4 Telemetry: N/A Assessment: Completed Skin: C/D/I IV: left AC Pain: N/A Tubes: Safety Measures: Safety Fall Prevention Plan has been given, discussed and signed Admission: Completed [facility] Orientation: Patient has been orientated to the room, unit and staff.  Family:  Orders have been reviewed and implemented. Will continue to monitor the patient. Call light has been placed within reach and bed alarm has been activated.   Margarita Grizzle, RN

## 2023-01-29 NOTE — ED Notes (Signed)
ED TO INPATIENT HANDOFF REPORT  ED Nurse Name and Phone #: [phone]  S Name/Age/Gender Kendra Clay 62 y.o. female Room/Bed: 008C/008C  Code Status   Code Status: Full Code  Home/SNF/Other Home Patient oriented to: self, place, time, and situation Is this baseline? Yes   Triage Complete: Triage complete  Chief Complaint Generalized weakness [R53.1]  Triage Note Pt BIB EMS for lethargy, diarrhea for 1 week. HD MWF. Last one was 12/22. VS with ems 138/78 hr 68 spo2 97% rr 16 CBG 129. Denies NV. PT hasn't been eating as much lately.   Allergies Allergies  Allergen Reactions   E-Mycin [Erythromycin] Hives   Sulfa Antibiotics Rash    Skin Rash     Lisinopril Cough    Level of Care/Admitting Diagnosis ED Disposition     ED Disposition  Admit   Condition  --   Comment  Hospital Area: [facility] [[id number]]  Level of Care: Med-Surg [16]  May admit patient to [facility] or [facility] if equivalent level of care is available:: No  Covid Evaluation: Asymptomatic - no recent exposure (last 10 days) testing not required  Diagnosis: Generalized weakness [914782]  Admitting Physician: Steffanie Rainwater [[id number]]  Attending Physician: Steffanie Rainwater [[id number]]  Certification:: I certify this patient will need inpatient services for at least 2 midnights  Expected Medical Readiness: 02/01/2023          B Medical/Surgery History Past Medical History:  Diagnosis Date   Complication of anesthesia    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 Dr Arlyce Dice 30 years ago. 2D ECHO  30 yearsa go.   History of blood transfusion    C- Section   History of bronchitis    numerous, last time> 1 year   Hypertension    Night muscle spasms    legs   Polycystic kidney disease    Genetic dx 23 years ago   PONV (postoperative nausea and vomiting)    patch helped   Scoliosis     Strabismus    Past Surgical History:  Procedure Laterality Date   A/V FISTULAGRAM Right 07/10/2022   Procedure: A/V Fistulagram;  Surgeon: Cephus Shelling, MD;  Location: [facility] INVASIVE CV LAB;  Service: Cardiovascular;  Laterality: Right;   ABDOMINAL HYSTERECTOMY     AV FISTULA PLACEMENT  09/01/2011   Procedure: ARTERIOVENOUS (AV) FISTULA CREATION;  Surgeon: Larina Earthly, MD;  Location: [facility] OR;  Service: Vascular;  Laterality: Right;   CESAREAN SECTION  1997   EYE SURGERY     for lazy eye   ORIF FEMUR FRACTURE Left 08/29/2021   Procedure: OPEN REDUCTION INTERNAL FIXATION (ORIF) DISTAL FEMUR FRACTURE;  Surgeon: Roby Lofts, MD;  Location: [facility] OR;  Service: Orthopedics;  Laterality: Left;   ORIF WRIST FRACTURE Left 08/29/2021   Procedure: OPEN REDUCTION INTERNAL FIXATION (ORIF) WRIST FRACTURE;  Surgeon: Roby Lofts, MD;  Location: [facility] OR;  Service: Orthopedics;  Laterality: Left;   PERIPHERAL VASCULAR BALLOON ANGIOPLASTY  07/10/2022   Procedure: PERIPHERAL VASCULAR BALLOON ANGIOPLASTY;  Surgeon: Cephus Shelling, MD;  Location: [facility] INVASIVE CV LAB;  Service: Cardiovascular;;   TIBIA IM NAIL INSERTION Left 05/19/2018   Procedure: INTRAMEDULLARY (IM) NAIL TIBIAL;  Surgeon: Bjorn Pippin, MD;  Location: [facility] OR;  Service: Orthopedics;  Laterality: Left;   TIBIA IM NAIL INSERTION Right  08/29/2021   Procedure: INTRAMEDULLARY (IM) NAIL TIBIAL WITH IRRIGATION AND DEBRIDMENT;  Surgeon: Roby Lofts, MD;  Location: [facility] OR;  Service: Orthopedics;  Laterality: Right;     A IV Location/Drains/Wounds Patient Lines/Drains/Airways Status     Active Line/Drains/Airways     Name Placement date Placement time Site Days   Peripheral IV 01/29/23 22 G Left Antecubital 01/29/23  1243  Antecubital  less than 1   Fistula / Graft Right Other (Comment) Arteriovenous fistula --  --  Other (Comment)  --   Fistula / Graft Right Forearm --  --  Forearm  --   Pressure Injury 10/13/21 Sacrum Bilateral Stage 1 -   Intact skin with non-blanchable redness of a localized area usually over a bony prominence. 10/13/21  1859  -- 473   Wound / Incision (Open or Dehisced) 10/13/21 Non-pressure wound Pretibial Right 10/13/21  1859  Pretibial  473            Intake/Output Last 24 hours No intake or output data in the 24 hours ending 01/29/23 1704  Labs/Imaging Results for orders placed or performed during the hospital encounter of 01/29/23 (from the past 48 hours)  Brain natriuretic peptide     Status: Abnormal   Collection Time: 01/29/23 12:39 PM  Result Value Ref Range   B Natriuretic Peptide 2,180.5 (H) 0.0 - 100.0 pg/mL    Comment: Performed at [facility], [address] [place], [place] [address]  Comprehensive metabolic panel     Status: Abnormal   Collection Time: 01/29/23 12:39 PM  Result Value Ref Range   Sodium 131 (L) 135 - 145 mmol/L   Potassium 3.0 (L) 3.5 - 5.1 mmol/L   Chloride 91 (L) 98 - 111 mmol/L   CO2 27 22 - 32 mmol/L   Glucose, Bld 87 70 - 99 mg/dL    Comment: Glucose reference range applies only to samples taken after fasting for at least 8 hours.   BUN 48 (H) 8 - 23 mg/dL   Creatinine, Ser 60.45 (H) 0.44 - 1.00 mg/dL   Calcium 8.7 (L) 8.9 - 10.3 mg/dL   Total Protein 6.9 6.5 - 8.1 g/dL   Albumin 2.6 (L) 3.5 - 5.0 g/dL   AST 11 (L) 15 - 41 U/L   ALT 6 0 - 44 U/L   Alkaline Phosphatase 85 38 - 126 U/L   Total Bilirubin 0.9 <1.2 mg/dL   GFR, Estimated 3 (L) >60 mL/min    Comment: (NOTE) Calculated using the CKD-EPI Creatinine Equation (2021)    Anion gap 13 5 - 15    Comment: Performed at [facility], [address] [place], [place] [address]  Lipase, blood     Status: None   Collection Time: 01/29/23 12:39 PM  Result Value Ref Range   Lipase 29 11 - 51 U/L    Comment: Performed at [facility], [address], [place], [place] [address]  Troponin I (High Sensitivity)     Status: Abnormal   Collection Time: 01/29/23 12:39 PM  Result Value Ref  Range   Troponin I (High Sensitivity) 93 (H) <18 ng/L    Comment: (NOTE) Elevated high sensitivity troponin I (hsTnI) values and significant  changes across serial measurements may suggest ACS but many other  chronic and acute conditions are known to elevate hsTnI results.  Refer to the "Links" section for chest pain algorithms and additional  guidance. Performed at [facility], [address] [place], [place] [address]   CBC with Differential     Status: Abnormal   Collection Time: 01/29/23 12:39 PM  Result Value Ref Range   WBC 5.4 4.0 - 10.5 K/uL   RBC 3.38 (L) 3.87 - 5.11 MIL/uL   Hemoglobin 9.4 (L) 12.0 - 15.0 g/dL   HCT 29.5 (L) 28.4 - 13.2 %   MCV 91.1 80.0 - 100.0 fL   MCH 27.8 26.0 - 34.0 pg   MCHC 30.5 30.0 - 36.0 g/dL   RDW 44.0 10.2 - 72.5 %   Platelets 124 (L) 150 - 400 K/uL   nRBC 0.0 0.0 - 0.2 %   Neutrophils Relative % 67 %   Neutro Abs 3.7 1.7 - 7.7 K/uL   Lymphocytes Relative 23 %   Lymphs Abs 1.2 0.7 - 4.0 K/uL   Monocytes Relative 7 %   Monocytes Absolute 0.4 0.1 - 1.0 K/uL   Eosinophils Relative 1 %   Eosinophils Absolute 0.1 0.0 - 0.5 K/uL   Basophils Relative 1 %   Basophils Absolute 0.0 0.0 - 0.1 K/uL   Immature Granulocytes 1 %   Abs Immature Granulocytes 0.05 0.00 - 0.07 K/uL    Comment: Performed at [facility], [address] [place], [place] [address]  Magnesium     Status: None   Collection Time: 01/29/23 12:39 PM  Result Value Ref Range   Magnesium 1.7 1.7 - 2.4 mg/dL    Comment: Performed at [facility], [address] [place], [place] [address]  Phosphorus     Status: None   Collection Time: 01/29/23 12:39 PM  Result Value Ref Range   Phosphorus 3.3 2.5 - 4.6 mg/dL    Comment: Performed at [facility], [address] [place], [place] [address]  Troponin I (High Sensitivity)     Status: Abnormal   Collection Time: 01/29/23  2:53 PM  Result Value Ref Range   Troponin I (High Sensitivity) 86 (H) <18 ng/L     Comment: (NOTE) Elevated high sensitivity troponin I (hsTnI) values and significant  changes across serial measurements may suggest ACS but many other  chronic and acute conditions are known to elevate hsTnI results.  Refer to the "Links" section for chest pain algorithms and additional  guidance. Performed at [facility], [address], [place], [place] [address]    DG Chest Port 1 View Result Date: 01/29/2023 CLINICAL DATA:  62 year old female with diarrhea, lethargy, fatigue. Dialysis patient. EXAM: PORTABLE CHEST 1 VIEW COMPARISON:  Chest radiographs 12/29/2022 and earlier. FINDINGS: Portable AP semi upright view at 1212 hours. Stable cardiomegaly and mediastinal contours. Calcified aortic atherosclerosis. Levoconvex thoracic scoliosis and large lung volumes again noted. No pneumothorax, pulmonary edema, pleural effusion or confluent lung opacity. Visualized tracheal air column is within normal limits. Right subclavian vascular stent again noted. Paucity of bowel gas the visible abdomen. IMPRESSION: 1.  No acute cardiopulmonary abnormality. 2. Cardiomegaly.  Aortic Atherosclerosis (ICD10-I70.0). Electronically Signed   By: Odessa [place] M.D.   On: 01/29/2023 12:36    Pending Labs Unresulted Labs (From admission, onward)     Start     Ordered   01/29/23 1621  Hepatitis B surface antigen  (New Admission Hemo Labs (Hepatitis B))  Once,   URGENT        01/29/23 1623   01/29/23 1621  Hepatitis B surface antibody,quantitative  (New Admission Hemo Labs (Hepatitis B))  Once,   URGENT  01/29/23 1623   Signed and Held  Renal function panel  Once,   R        Signed and Held   Signed and Held  CBC  Once,   R        Signed and Held            Vitals/Pain Today's Vitals   01/29/23 1445 01/29/23 1544 01/29/23 1545 01/29/23 1615  BP: (!) 151/76  (!) 164/78 (!) 168/84  Pulse: 76  74 74  Resp: (!) 24  (!) 23 (!) 26  Temp:  98.3 F (36.8 C)    TempSrc:  Oral    SpO2: 100%  98%  100%  Weight:      Height:      PainSc:        Isolation Precautions No active isolations  Medications Medications  cinacalcet (SENSIPAR) tablet 30 mg (has no administration in time range)  ferric citrate (AURYXIA) tablet 210 mg (has no administration in time range)  doxercalciferol (HECTOROL) injection 3 mcg (has no administration in time range)  iron sucrose (VENOFER) 100 mg in sodium chloride 0.9 % 100 mL IVPB (has no administration in time range)  (feeding supplement) PROSource Plus liquid 30 mL (has no administration in time range)  feeding supplement (NEPRO CARB STEADY) liquid 237 mL (has no administration in time range)  multivitamin (RENA-VIT) tablet 1 tablet (has no administration in time range)  heparin injection 5,000 Units (has no administration in time range)  atorvastatin (LIPITOR) tablet 40 mg (has no administration in time range)  Carboxymethylcell-Glycerin PF 0.5-1 % SOLN 1 drop (has no administration in time range)  carvedilol (COREG) tablet 12.5 mg (has no administration in time range)  fluticasone (FLONASE) 50 MCG/ACT nasal spray 1 spray (has no administration in time range)  isosorbide mononitrate (IMDUR) 24 hr tablet 30 mg (has no administration in time range)  losartan (COZAAR) tablet 25 mg (has no administration in time range)  pantoprazole (PROTONIX) EC tablet 40 mg (has no administration in time range)  potassium chloride SA (KLOR-CON M) CR tablet 40 mEq (40 mEq Oral Given 01/29/23 1548)    Mobility walks     Focused Assessments Renal Assessment Handoff:  Hemodialysis Schedule: Hemodialysis Schedule: Monday/Wednesday/Friday Last Hemodialysis date and time:    Restricted appendage: left arm   R Recommendations: See Admitting Provider Note  Report given to:   Additional Notes:

## 2023-01-29 NOTE — Consult Note (Addendum)
[facility] Renal Consultation Note    Indication for Consultation:  Management of ESRD/hemodialysis; anemia, hypertension/volume and secondary hyperparathyroidism PCP: No PCP on file Nephrologist: Dr. Fatima Sanger  HPI: Kendra Clay is a 62 y.o. female with ESRD on hemodialysis MWF at [facility]. PMH: PKD, HTN, multiple fractures from falls, anemia, SHPT. Last HD 01/25/2023. Was going to HD 01/27/2023 and her car broke down in route.   Patient says she has not been feeling well for past week. Appetite has been poor, lethargic, diarrhea X 3 yesterday-took imodium. None since. Nausea but no vomiting. She denies chest pain, SOB, F,C. She says she doesn't think she has excess fluid on board but does have very slight ankle edema. She was worked up at [facility] for same-is negative for influenza, RSV, COVID. She reports imaging down on abdomen done at [facility] but unable to get records. Have called HD unit for them. Na 131 K+ 3.0 SCr 12.4 BUN 48 Co2 27 AST 11 ALT 6 Troponin 93. CXR unremarkable. Concerned about hypokalemia as she is having frequent PACS on monitor. KCL 40 MEQ ordered per ED physician. Holding IVF as BP is elevated. She is being admitted per primary for dehydration.   Past Medical History:  Diagnosis Date   Complication of anesthesia    Enlarged liver    secondary to PKD   ESRD on hemodialysis (HCC)    MWF [place]   Heart murmur    "slight" per ? Dr Arlyce Dice 30 years ago. 2D ECHO  30 yearsa go.   History of blood transfusion    C- Section   History of bronchitis    numerous, last time> 1 year   Hypertension    Night muscle spasms    legs   Polycystic kidney disease    Genetic dx 23 years ago   PONV (postoperative nausea and vomiting)    patch helped   Scoliosis    Strabismus    Past Surgical History:  Procedure Laterality Date   A/V FISTULAGRAM Right 07/10/2022   Procedure: A/V Fistulagram;  Surgeon: Cephus Shelling, MD;  [facility]: [facility]  INVASIVE CV LAB;  Service: Cardiovascular;  Laterality: Right;   ABDOMINAL HYSTERECTOMY     AV FISTULA PLACEMENT  09/01/2011   Procedure: ARTERIOVENOUS (AV) FISTULA CREATION;  Surgeon: Larina Earthly, MD;  Location: [facility] OR;  Service: Vascular;  Laterality: Right;   CESAREAN SECTION  1997   EYE SURGERY     for lazy eye   ORIF FEMUR FRACTURE Left 08/29/2021   Procedure: OPEN REDUCTION INTERNAL FIXATION (ORIF) DISTAL FEMUR FRACTURE;  Surgeon: Roby Lofts, MD;  Location: [facility] OR;  Service: Orthopedics;  Laterality: Left;   ORIF WRIST FRACTURE Left 08/29/2021   Procedure: OPEN REDUCTION INTERNAL FIXATION (ORIF) WRIST FRACTURE;  Surgeon: Roby Lofts, MD;  Location: [facility] OR;  Service: Orthopedics;  Laterality: Left;   PERIPHERAL VASCULAR BALLOON ANGIOPLASTY  07/10/2022   Procedure: PERIPHERAL VASCULAR BALLOON ANGIOPLASTY;  Surgeon: Cephus Shelling, MD;  [facility]: [facility] INVASIVE CV LAB;  Service: Cardiovascular;;   TIBIA IM NAIL INSERTION Left 05/19/2018   Procedure: INTRAMEDULLARY (IM) NAIL TIBIAL;  Surgeon: Bjorn Pippin, MD;  Location: [facility] OR;  Service: Orthopedics;  Laterality: Left;   TIBIA IM NAIL INSERTION Right 08/29/2021   Procedure: INTRAMEDULLARY (IM) NAIL TIBIAL WITH IRRIGATION AND DEBRIDMENT;  Surgeon: Roby Lofts, MD;  Location: [facility] OR;  Service: Orthopedics;  Laterality: Right;   Family History  Problem Relation Age of  Onset   Arthritis Mother    Kidney disease Father    Polycystic kidney disease Son    Asthma Son    Hypertension Maternal Grandmother    Social History:  reports that she quit smoking about 8 years ago. Her smoking use included cigarettes. She started smoking about 43 years ago. She has a 52.4 pack-year smoking history. She quit smokeless tobacco use about 9 years ago. She reports that she does not drink alcohol and does not use drugs. Allergies  Allergen Reactions   E-Mycin [Erythromycin] Hives   Sulfa Antibiotics Rash    Skin Rash     Lisinopril Cough    Prior to Admission medications   Medication Sig Start Date End Date Taking? Authorizing Provider  acetaminophen (TYLENOL) 500 MG tablet Take 500 mg by mouth every 8 (eight) hours as needed for moderate pain. No more than 3 a day   Yes [provider]  atorvastatin (LIPITOR) 40 MG tablet Take 40 mg by mouth at bedtime.   Yes [provider]  Carboxymethylcell-Glycerin PF (REFRESH RELIEVA PF) 0.5-1 % SOLN Place 1 drop into both eyes as needed (dry eyes).   Yes [provider]  carvedilol (COREG) 12.5 MG tablet Take 12.5 mg by mouth 2 (two) times daily. 10/09/21  Yes [provider]  Darbepoetin Alfa (ARANESP) 150 MCG/0.3ML SOSY injection Inject 0.3 mLs (150 mcg total) into the vein every Wednesday with hemodialysis. 09/25/21  Yes Setzer, Lynnell Jude, PA-C  doxercalciferol (HECTOROL) 4 MCG/2ML injection Inject 4 mLs (8 mcg total) into the vein every Monday, Wednesday, and Friday with hemodialysis. 09/25/21  Yes Setzer, Lynnell Jude, PA-C  ferric citrate (AURYXIA) 1 GM 210 MG(Fe) tablet Take 210 mg by mouth 3 (three) times daily with meals.   Yes [provider]  fluticasone (FLONASE) 50 MCG/ACT nasal spray Place 1 spray into both nostrils daily as needed for allergies or rhinitis.   Yes [provider]  isosorbide mononitrate (IMDUR) 30 MG 24 hr tablet Take 30 mg by mouth daily. 10/10/21  Yes [provider]  loperamide (IMODIUM A-D) 2 MG tablet Take 2 mg by mouth daily as needed for diarrhea or loose stools.   Yes [provider]  loratadine (CLARITIN) 10 MG tablet Take 10 mg by mouth daily as needed for allergies.   Yes [provider]  losartan (COZAAR) 25 MG tablet Take 25 mg by mouth daily. 09/10/22  Yes [provider]  nitroGLYCERIN (NITROSTAT) 0.4 MG SL tablet Place 0.4 mg under the tongue every 5 (five) minutes as needed for chest pain.   Yes [provider]  pantoprazole (PROTONIX) 40 MG tablet Take 40 mg by  mouth 2 (two) times daily. 02/26/20  Yes [provider]  polyethylene glycol powder (MIRALAX) powder Start taking 1 capful 3 times a day. Slowly cut back as needed until you have normal bowel movements. Patient taking differently: Take 17 g by mouth daily as needed for mild constipation. 08/17/17  Yes Cardama, Amadeo Garnet, MD  ondansetron (ZOFRAN) 4 MG tablet Take 1 tablet (4 mg total) by mouth every 6 (six) hours as needed for nausea. Patient not taking: Reported on 01/15/2023 10/15/21   Merlene Laughter, DO   Current Facility-Administered Medications  Medication Dose Route Frequency Provider Last Rate Last Admin   0.9 %  sodium chloride infusion  250 mL Intravenous PRN Cephus Shelling, MD       potassium chloride SA (KLOR-CON M) CR tablet 40 mEq  40 mEq Oral Once Arby Barrette, MD       sodium chloride flush (NS) 0.9 % injection 3 mL  3 mL Intravenous Q12H Cephus Shelling, MD       sodium chloride flush (NS) 0.9 % injection 3 mL  3 mL Intravenous PRN Cephus Shelling, MD       Current Outpatient Medications  Medication Sig Dispense Refill   acetaminophen (TYLENOL) 500 MG tablet Take 500 mg by mouth every 8 (eight) hours as needed for moderate pain. No more than 3 a day     atorvastatin (LIPITOR) 40 MG tablet Take 40 mg by mouth at bedtime.     Carboxymethylcell-Glycerin PF (REFRESH RELIEVA PF) 0.5-1 % SOLN Place 1 drop into both eyes as needed (dry eyes).     carvedilol (COREG) 12.5 MG tablet Take 12.5 mg by mouth 2 (two) times daily.     Darbepoetin Alfa (ARANESP) 150 MCG/0.3ML SOSY injection Inject 0.3 mLs (150 mcg total) into the vein every Wednesday with hemodialysis. 1.68 mL    doxercalciferol (HECTOROL) 4 MCG/2ML injection Inject 4 mLs (8 mcg total) into the vein every Monday, Wednesday, and Friday with hemodialysis. 2 mL    ferric citrate (AURYXIA) 1 GM 210 MG(Fe) tablet Take 210 mg by mouth 3 (three) times daily with meals.     fluticasone (FLONASE) 50  MCG/ACT nasal spray Place 1 spray into both nostrils daily as needed for allergies or rhinitis.     isosorbide mononitrate (IMDUR) 30 MG 24 hr tablet Take 30 mg by mouth daily.     loperamide (IMODIUM A-D) 2 MG tablet Take 2 mg by mouth daily as needed for diarrhea or loose stools.     loratadine (CLARITIN) 10 MG tablet Take 10 mg by mouth daily as needed for allergies.     losartan (COZAAR) 25 MG tablet Take 25 mg by mouth daily.     nitroGLYCERIN (NITROSTAT) 0.4 MG SL tablet Place 0.4 mg under the tongue every 5 (five) minutes as needed for chest pain.     pantoprazole (PROTONIX) 40 MG tablet Take 40 mg by mouth 2 (two) times daily.     polyethylene glycol powder (MIRALAX) powder Start taking 1 capful 3 times a day. Slowly cut back as needed until you have normal bowel movements. (Patient taking differently: Take 17 g by mouth daily as needed for mild constipation.) 255 g 0   ondansetron (ZOFRAN) 4 MG tablet Take 1 tablet (4 mg total) by mouth every 6 (six) hours as needed for nausea. (Patient not taking: Reported on 01/15/2023) 20 tablet 0   Labs: Basic Metabolic Panel: Recent Labs  Lab 01/29/23 1239  NA 131*  K 3.0*  CL 91*  CO2 27  GLUCOSE 87  BUN 48*  CREATININE 12.40*  CALCIUM 8.7*  PHOS 3.3   Liver Function Tests: Recent Labs  Lab 01/29/23 1239  AST 11*  ALT 6  ALKPHOS 85  BILITOT 0.9  PROT 6.9  ALBUMIN 2.6*   Recent Labs  Lab 01/29/23 1239  LIPASE 29   No results for input(s): "AMMONIA" in the last 168 hours. CBC: Recent Labs  Lab 01/29/23 1239  WBC 5.4  NEUTROABS 3.7  HGB 9.4*  HCT 30.8*  MCV 91.1  PLT 124*   Cardiac Enzymes: No results for input(s): "CKTOTAL", "CKMB", "CKMBINDEX", "TROPONINI" in the last 168 hours. CBG: No results for input(s): "GLUCAP" in the last 168 hours. Iron Studies: No results for input(s): "IRON", "TIBC", "TRANSFERRIN", "FERRITIN" in  the last 72 hours. Studies/Results: DG Chest Port 1 View Result Date:  01/29/2023 CLINICAL DATA:  62 year old female with diarrhea, lethargy, fatigue. Dialysis patient. EXAM: PORTABLE CHEST 1 VIEW COMPARISON:  Chest radiographs 12/29/2022 and earlier. FINDINGS: Portable AP semi upright view at 1212 hours. Stable cardiomegaly and mediastinal contours. Calcified aortic atherosclerosis. Levoconvex thoracic scoliosis and large lung volumes again noted. No pneumothorax, pulmonary edema, pleural effusion or confluent lung opacity. Visualized tracheal air column is within normal limits. Right subclavian vascular stent again noted. Paucity of bowel gas the visible abdomen. IMPRESSION: 1.  No acute cardiopulmonary abnormality. 2. Cardiomegaly.  Aortic Atherosclerosis (ICD10-I70.0). Electronically Signed   By: Odessa Fleming M.D.   On: 01/29/2023 12:36    ROS: As per HPI otherwise negative.   Physical Exam: Vitals:   01/29/23 1415 01/29/23 1430 01/29/23 1445 01/29/23 1544  BP: (!) 155/84 (!) 163/81 (!) 151/76   Pulse: 70 70 76   Resp: 18 (!) 25 (!) 24   Temp:    98.3 F (36.8 C)  TempSrc:    Oral  SpO2: 100% 100% 100%   Weight:      Height:         General: Pleasant chronically ill appearing female in no acute distress. Head: Normocephalic, atraumatic, sclera non-icteric, mucus membranes are dry Neck: Supple. JVD not elevated. Lungs: Clear bilaterally to auscultation without wheezes, rales, or rhonchi. Breathing is unlabored. Heart: RRR with S1 S2. No murmurs, rubs, or gallops appreciated. Abdomen: Soft, non-tender, non-distended with normoactive bowel sounds. No rebound/guarding. No obvious abdominal masses. M-S:  Strength and tone appear normal for age. Lower extremities:very trace ankle edema.  Neuro: Alert and oriented X 3. Moves all extremities spontaneously. No asterixis.  Psych:  Responds to questions appropriately with a normal affect. Dialysis Access: R AVF Aneurysmal + T/B  Dialysis Orders: Center: [facility]  on MWF . 3:15 hr 160NRe 350/Autoflow  1.5 57.8 kg 2.0 K/ 2.0 Ca AVF - Heparin 1200 units IV three times per week - Hectorol 3 mcg  IV three times per week - Cinacalcet 30 mg PO three times per week - Mircera 100 mcg IV q 2 weeks (last dose 01/25/2023) - Venofer 100 mg IV X 5 (3/5 doses given)    Assessment/Plan: Generalized Weakness/Diarrhea-work up per primary. BP is elevated and has missed HD so holding off giving IVF at this time. She does not have symptoms of overt uremia. SCr 12.4 BUN 48. Her Kt/V at OP center is excellent.  Hypokalemia- KCL 40 MEQ PO per ED MD. Use 3.0 K bath with HD. Follow labs.   ESRD -  MWF. Last HD 01/25/2023. HD early AM 01/30/2023. She does not have urgent HD needs at this time.   Hypertension/volume  - BP elevated. On Carvedilol 12.5 mg PO BID. Resume when eating. No signs of volume excess by CXR or exam. Does have very trace ankle edema but lungs are clear. UF as tolerated.   Anemia  - HGB 9.4 Recent ESA dose 01/25/2023. Finish Fe load here.   Metabolic bone disease -  Continue Auryxia 1 tab PO TID AC, sensipar, VDRA.   Nutrition - Albumin is low. Will add protein supplements when patient is eating.   Davanee Klinkner H. Manson Passey, NP-C 01/29/2023, 3:45 PM  [employer] [phone]

## 2023-01-29 NOTE — H&P (Signed)
History and Physical    Patient: Kendra Clay GUY:[id number] DOB: 10-07-1960 DOA: 01/29/2023 DOS: the patient was seen and examined on 01/29/2023 PCP: Patient, [name]  Patient coming from: Home  Chief Complaint:  Chief Complaint  Patient presents with   Diarrhea   Fatigue   HPI: Kendra Clay is a 62 y.o. female with medical history significant of ESRD on MWF HD, HTN, CAD, PCKD, anemia of chronic disease, COPD, GERD combined systolic and diastolic CHF and HLD who presents to the ED for evaluation of generalized weakness and fatigue x 1 week.Patient reports that her last HD was last Sunday, 12/22.  She was on her way to her HD session on Tuesday 12/24 when her car broke down so she was unable to make the appointment.  She has been lethargic with poor appetite and some mild nausea since then. She had 3 episodes of watery stools yesterday that resolved with Imodium. She denies any chest pain, vomiting, shortness of breath, abdominal pain, dizziness or headache. States she was evaluated at [facility] 4 days ago for similar presentation. CT abdomen scan with completed without any acute abnormalities.  She also reports testing negative for the flu, COVID and RSV.  ED course: Initial vitals were temp 98.7, RR 16, HR 68, BP 144/71, SpO2 100% on room air Labs show Na 131 K+ 3.0 SCr 12.4 BUN 48 Co2 27 AST 11 ALT 6 Troponin 93,  WBC 5.4, Hgb 9.4 CXR with no acute cardiopulmonary disease  EKG shows sinus rhythm with RBBB and occasional PVCs Patient received 40 mEq of KCl. Nephrology was consulted for evaluation TRH was consulted for admission  Review of Systems: As mentioned in the history of present illness. All other systems reviewed and are negative. Past Medical History:  Diagnosis Date   Complication of anesthesia    Enlarged liver    secondary to PKD   ESRD on hemodialysis (HCC)    MWF [place]   Heart murmur    "slight" per ? Dr Arlyce Dice 30 years ago. 2D ECHO  30 yearsa  go.   History of blood transfusion    C- Section   History of bronchitis    numerous, last time> 1 year   Hypertension    Night muscle spasms    legs   Polycystic kidney disease    Genetic dx 23 years ago   PONV (postoperative nausea and vomiting)    patch helped   Scoliosis    Strabismus    Past Surgical History:  Procedure Laterality Date   A/V FISTULAGRAM Right 07/10/2022   Procedure: A/V Fistulagram;  Surgeon: Cephus Shelling, MD;  [facility] INVASIVE CV LAB;  Service: Cardiovascular;  Laterality: Right;   ABDOMINAL HYSTERECTOMY     AV FISTULA PLACEMENT  09/01/2011   Procedure: ARTERIOVENOUS (AV) FISTULA CREATION;  Surgeon: Larina Earthly, MD;  Location: [facility] OR;  Service: Vascular;  Laterality: Right;   CESAREAN SECTION  1997   EYE SURGERY     for lazy eye   ORIF FEMUR FRACTURE Left 08/29/2021   Procedure: OPEN REDUCTION INTERNAL FIXATION (ORIF) DISTAL FEMUR FRACTURE;  Surgeon: Roby Lofts, MD;  Location: [facility] OR;  Service: Orthopedics;  Laterality: Left;   ORIF WRIST FRACTURE Left 08/29/2021   Procedure: OPEN REDUCTION INTERNAL FIXATION (ORIF) WRIST FRACTURE;  Surgeon: Roby Lofts, MD;  Location: [facility] OR;  Service: Orthopedics;  Laterality: Left;   PERIPHERAL VASCULAR BALLOON ANGIOPLASTY  07/10/2022   Procedure: PERIPHERAL VASCULAR BALLOON  ANGIOPLASTY;  Surgeon: Cephus Shelling, MD;  Location: [facility] INVASIVE CV LAB;  Service: Cardiovascular;;   TIBIA IM NAIL INSERTION Left 05/19/2018   Procedure: INTRAMEDULLARY (IM) NAIL TIBIAL;  Surgeon: Bjorn Pippin, MD;  Location: [facility] OR;  Service: Orthopedics;  Laterality: Left;   TIBIA IM NAIL INSERTION Right 08/29/2021   Procedure: INTRAMEDULLARY (IM) NAIL TIBIAL WITH IRRIGATION AND DEBRIDMENT;  Surgeon: Roby Lofts, MD;  Location: [facility] OR;  Service: Orthopedics;  Laterality: Right;   Social History:  reports that she quit smoking about 8 years ago. Her smoking use included cigarettes. She started smoking about 43 years ago. She has  a 52.4 pack-year smoking history. She quit smokeless tobacco use about 9 years ago. She reports that she does not drink alcohol and does not use drugs.  Allergies  Allergen Reactions   E-Mycin [Erythromycin] Hives   Sulfa Antibiotics Rash    Skin Rash     Lisinopril Cough    Family History  Problem Relation Age of Onset   Arthritis Mother    Kidney disease Father    Polycystic kidney disease Son    Asthma Son    Hypertension Maternal Grandmother     Prior to Admission medications   Medication Sig Start Date End Date Taking? Authorizing Provider  acetaminophen (TYLENOL) 500 MG tablet Take 500 mg by mouth every 8 (eight) hours as needed for moderate pain. No more than 3 a day   Yes [provider]  atorvastatin (LIPITOR) 40 MG tablet Take 40 mg by mouth at bedtime.   Yes [provider]  Carboxymethylcell-Glycerin PF (REFRESH RELIEVA PF) 0.5-1 % SOLN Place 1 drop into both eyes as needed (dry eyes).   Yes [provider]  carvedilol (COREG) 12.5 MG tablet Take 12.5 mg by mouth 2 (two) times daily. 10/09/21  Yes [provider]  Darbepoetin Alfa (ARANESP) 150 MCG/0.3ML SOSY injection Inject 0.3 mLs (150 mcg total) into the vein every Wednesday with hemodialysis. 09/25/21  Yes Setzer, Lynnell Jude, PA-C  doxercalciferol (HECTOROL) 4 MCG/2ML injection Inject 4 mLs (8 mcg total) into the vein every Monday, Wednesday, and Friday with hemodialysis. 09/25/21  Yes Setzer, Lynnell Jude, PA-C  ferric citrate (AURYXIA) 1 GM 210 MG(Fe) tablet Take 210 mg by mouth 3 (three) times daily with meals.   Yes [provider]  fluticasone (FLONASE) 50 MCG/ACT nasal spray Place 1 spray into both nostrils daily as needed for allergies or rhinitis.   Yes [provider]  isosorbide mononitrate (IMDUR) 30 MG 24 hr tablet Take 30 mg by mouth daily. 10/10/21  Yes [provider]  loperamide (IMODIUM A-D) 2 MG tablet Take 2 mg by mouth daily as needed for diarrhea  or loose stools.   Yes [provider]  loratadine (CLARITIN) 10 MG tablet Take 10 mg by mouth daily as needed for allergies.   Yes [provider]  losartan (COZAAR) 25 MG tablet Take 25 mg by mouth daily. 09/10/22  Yes [provider]  nitroGLYCERIN (NITROSTAT) 0.4 MG SL tablet Place 0.4 mg under the tongue every 5 (five) minutes as needed for chest pain.   Yes [provider]  pantoprazole (PROTONIX) 40 MG tablet Take 40 mg by mouth 2 (two) times daily. 02/26/20  Yes [provider]  polyethylene glycol powder (MIRALAX) powder Start taking 1 capful 3 times a day. Slowly cut back as needed until you have normal bowel movements. Patient taking differently: Take 17 g by mouth daily as  needed for mild constipation. 08/17/17  Yes Cardama, Amadeo Garnet, MD  ondansetron (ZOFRAN) 4 MG tablet Take 1 tablet (4 mg total) by mouth every 6 (six) hours as needed for nausea. Patient not taking: Reported on 01/15/2023 10/15/21   Merlene Laughter, DO    Physical Exam: Vitals:   01/29/23 1615 01/29/23 1700 01/29/23 1758 01/29/23 1800  BP: (!) 168/84 (!) 156/78  (!) 164/88  Pulse: 74 75  81  Resp: (!) 26 16  18   Temp:    98 F (36.7 C)  TempSrc:    Oral  SpO2: 100% 98%  100%  Weight:   57.3 kg   Height:   5\' 3"  (1.6 m)    General: Pleasant, chronically ill-appearing woman laying in bed. No acute distress. HEENT: [place]/AT. Anicteric sclera.  Dry mucous membrane. CV: RRR. No murmurs, rubs, or gallops. No LE edema Pulmonary: Lungs CTAB. Normal effort. No wheezing or rales. Abdominal: Soft, nontender, nondistended. Normal bowel sounds. Extremities: Palpable radial and DP pulses. Normal ROM. Right AVF with palpable thrill. Skin: Warm and dry. No obvious rash or lesions. Neuro: A&Ox3. Moves all extremities. Normal sensation to light touch. No focal deficit. Psych: Normal mood and affect  Data Reviewed: Labs show Na 131 K+ 3.0 SCr 12.4 BUN 48 Co2 27 AST 11 ALT 6  Troponin 93, WBC 5.4, Hgb 9.4, BNP 2180 CXR with no acute cardiopulmonary disease  EKG shows sinus rhythm with RBBB and occasional PVCs  Assessment and Plan: Kendra Clay is a 62 y.o. female with medical history significant of ESRD on MWF HD, HTN, CAD, PCKD, anemia of chronic disease, COPD, GERD combined systolic and diastolic CHF and HLD who presents to the ED for evaluation of generalized weakness and fatigue x 1 week and admitted for further management.  # Generalized weakness # Lethargy ESRD patient presenting to the ED for evaluation of generalized weakness and lethargy in the setting of missing HD session and decreased p.o. intake. She has no electrolyte abnormalities.  No evidence of acute infection. She reports 3 episodes of diarrhea yesterday which has now resolved. Mild hypokalemia but no other electrolyte abnormalities. Holding off giving IV fluids per nephrology recs -Check TSH, vitamin D, vitamin B12 -Protein supplementation -Registered dietitian consult, appreciate recs -PT/OT eval and treat -Fall precautions  # ESRD on HD # Mild hypokalemia # Anemia of chronic disease Patient on MWF HD sessions with last HD on Sunday 12/22.  Missed her HD session 2 days ago.  Patient slightly dry on exam. No signs of uremia. K+ slightly low at 3.0.  Hgb stable at 9.4, BNP is stable over the last month. S/p 40 mEq KCl in the ED. -Nephrology consulted, plan for HD tomorrow morning -Avoid nephrotoxic agents  # HTN BP elevated with SBP in the 140s to 160s likely in the setting of missing HD. -Continue Coreg, losartan and Imdur  # Protein caloric malnutrition Recent poor appetite and decrease p.o. intake leading to generalized weakness and lethargy.  Albumin low at 2.6. -Protein supplementation between meals -RD consulted, appreciate recs -PT/OT eval   Advance Care Planning:   Code Status: Full Code   Consults: Nephrology  Family Communication: No family at bedside  Severity of  Illness: The appropriate patient status for this patient is INPATIENT. Inpatient status is judged to be reasonable and necessary in order to provide the required intensity of service to ensure the patient's safety. The patient's presenting symptoms, physical exam findings, and initial radiographic and laboratory data  in the context of their chronic comorbidities is felt to place them at high risk for further clinical deterioration. Furthermore, it is not anticipated that the patient will be medically stable for discharge from the hospital within 2 midnights of admission.   * I certify that at the point of admission it is my clinical judgment that the patient will require inpatient hospital care spanning beyond 2 midnights from the point of admission due to high intensity of service, high risk for further deterioration and high frequency of surveillance required.*  Author: Steffanie Rainwater, MD 01/29/2023 7:59 PM  For on call review [email or web address].

## 2023-01-29 NOTE — ED Triage Notes (Signed)
Pt BIB EMS for lethargy, diarrhea for 1 week. HD MWF. Last one was 12/22. VS with ems 138/78 hr 68 spo2 97% rr 16 CBG 129. Denies NV. PT hasn't been eating as much lately.

## 2023-01-30 DIAGNOSIS — E876 Hypokalemia: Secondary | ICD-10-CM

## 2023-01-30 DIAGNOSIS — R531 Weakness: Secondary | ICD-10-CM | POA: Diagnosis not present

## 2023-01-30 DIAGNOSIS — R5383 Other fatigue: Secondary | ICD-10-CM

## 2023-01-30 LAB — CBC
HCT: 30.3 % — ABNORMAL LOW (ref 36.0–46.0)
Hemoglobin: 9.5 g/dL — ABNORMAL LOW (ref 12.0–15.0)
MCH: 27.5 pg (ref 26.0–34.0)
MCHC: 31.4 g/dL (ref 30.0–36.0)
MCV: 87.8 fL (ref 80.0–100.0)
Platelets: 117 10*3/uL — ABNORMAL LOW (ref 150–400)
RBC: 3.45 MIL/uL — ABNORMAL LOW (ref 3.87–5.11)
RDW: 15.7 % — ABNORMAL HIGH (ref 11.5–15.5)
WBC: 4.4 10*3/uL (ref 4.0–10.5)
nRBC: 0 % (ref 0.0–0.2)

## 2023-01-30 LAB — RENAL FUNCTION PANEL
Albumin: 2.3 g/dL — ABNORMAL LOW (ref 3.5–5.0)
Anion gap: 14 (ref 5–15)
BUN: 53 mg/dL — ABNORMAL HIGH (ref 8–23)
CO2: 25 mmol/L (ref 22–32)
Calcium: 8.6 mg/dL — ABNORMAL LOW (ref 8.9–10.3)
Chloride: 95 mmol/L — ABNORMAL LOW (ref 98–111)
Creatinine, Ser: 13.73 mg/dL — ABNORMAL HIGH (ref 0.44–1.00)
GFR, Estimated: 3 mL/min — ABNORMAL LOW (ref >60)
Glucose, Bld: 90 mg/dL (ref 70–99)
Phosphorus: 3.7 mg/dL (ref 2.5–4.6)
Potassium: 4.2 mmol/L (ref 3.5–5.1)
Sodium: 134 mmol/L — ABNORMAL LOW (ref 135–145)

## 2023-01-30 LAB — VITAMIN B12: Vitamin B-12: 237 pg/mL (ref 180–914)

## 2023-01-30 LAB — VITAMIN D 25 HYDROXY (VIT D DEFICIENCY, FRACTURES): Vit D, 25-Hydroxy: 21.55 ng/mL — ABNORMAL LOW (ref 30–100)

## 2023-01-30 LAB — TSH: TSH: 1.314 u[IU]/mL (ref 0.350–4.500)

## 2023-01-30 MED ORDER — VITAMIN D (ERGOCALCIFEROL) 1.25 MG (50000 UNIT) PO CAPS: 50000.0000 [IU] | ORAL_CAPSULE | ORAL | Status: DC

## 2023-01-30 MED ORDER — BOOST / RESOURCE BREEZE PO LIQD CUSTOM
1.0000 | Freq: Two times a day (BID) | ORAL | Status: DC
  Administered 2023-01-30: 1 via ORAL

## 2023-01-30 MED ORDER — LIDOCAINE HCL (PF) 1 % IJ SOLN
5.0000 mL | INTRAMUSCULAR | Status: DC | PRN
Start: 2023-01-30 — End: 2023-01-30

## 2023-01-30 MED ORDER — LIDOCAINE-PRILOCAINE 2.5-2.5 % EX CREA
1.0000 | TOPICAL_CREAM | CUTANEOUS | Status: DC | PRN
Start: 2023-01-30 — End: 2023-01-30

## 2023-01-30 MED ORDER — HEPARIN SODIUM (PORCINE) 1000 UNIT/ML DIALYSIS
1200.0000 [IU] | Freq: Once | INTRAMUSCULAR | Status: AC
  Administered 2023-01-30: 1200 [IU] via INTRAVENOUS_CENTRAL

## 2023-01-30 MED ORDER — HEPARIN SODIUM (PORCINE) 1000 UNIT/ML DIALYSIS
1000.0000 [IU] | INTRAMUSCULAR | Status: DC | PRN
Start: 2023-01-30 — End: 2023-01-30

## 2023-01-30 MED ORDER — PENTAFLUOROPROP-TETRAFLUOROETH EX AERO
1.0000 | INHALATION_SPRAY | CUTANEOUS | Status: DC | PRN
Start: 2023-01-30 — End: 2023-01-30

## 2023-01-30 MED ORDER — ISOSORBIDE MONONITRATE ER 30 MG PO TB24
30.0000 mg | ORAL_TABLET | Freq: Every day | ORAL | Status: DC
  Administered 2023-01-31: 30 mg via ORAL
  Filled 2023-01-30: qty 1

## 2023-01-30 MED ORDER — LOSARTAN POTASSIUM 25 MG PO TABS
25.0000 mg | ORAL_TABLET | Freq: Every day | ORAL | Status: DC
  Administered 2023-01-31: 25 mg via ORAL
  Filled 2023-01-30: qty 1

## 2023-01-30 NOTE — Progress Notes (Addendum)
[facility] Progress Note   Subjective: Seen in HD. Tolerating well. Feels better, says she is hungry.  Objective Vitals:   01/29/23 2011 01/30/23 0802 01/30/23 0807 01/30/23 0825  BP: 137/66  (!) 141/72 139/71  Pulse: 82   60  Resp:   (!) 21 20  Temp: 98.4 F (36.9 C)  98.2 F (36.8 C)   TempSrc: Oral  Oral   SpO2: 98%  96% 98%  Weight:  56.2 kg    Height:       General: Pleasant chronically ill appearing female in no acute distress. Neck: Supple. JVD not elevated. Lungs: Clear bilaterally to auscultation without wheezes, rales, or rhonchi. Breathing is unlabored. Heart: RRR with S1 S2. No murmurs, rubs, or gallops appreciated. Abdomen: Soft, non-tender, non-distended with normoactive bowel sounds. No rebound/guarding. No obvious abdominal masses. Lower extremities:very trace ankle edema.  Neuro: Alert and oriented X 3. Moves all extremities spontaneously. No asterixis.  Dialysis Access: R AVF Aneurysmal + T/B  Additional Objective Labs: Basic Metabolic Panel: Recent Labs  Lab 01/29/23 1239 01/30/23 0717  NA 131* 134*  K 3.0* 4.2  CL 91* 95*  CO2 27 25  GLUCOSE 87 90  BUN 48* 53*  CREATININE 12.40* 13.73*  CALCIUM 8.7* 8.6*  PHOS 3.3 3.7   Liver Function Tests: Recent Labs  Lab 01/29/23 1239 01/30/23 0717  AST 11*  --   ALT 6  --   ALKPHOS 85  --   BILITOT 0.9  --   PROT 6.9  --   ALBUMIN 2.6* 2.3*   Recent Labs  Lab 01/29/23 1239  LIPASE 29   CBC: Recent Labs  Lab 01/29/23 1239 01/30/23 0717  WBC 5.4 4.4  NEUTROABS 3.7  --   HGB 9.4* 9.5*  HCT 30.8* 30.3*  MCV 91.1 87.8  PLT 124* 117*   Blood Culture    Component Value Date/Time   SDES BLOOD LEFT ANTECUBITAL 10/13/2021 0511   SPECREQUEST  10/13/2021 0511    BOTTLES DRAWN AEROBIC ONLY Blood Culture results may not be optimal due to an inadequate volume of blood received in culture bottles   CULT  10/13/2021 0511    NO GROWTH 5 DAYS Performed at [facility]  Lab, [address], [place], [place] [address]    REPTSTATUS 10/18/2021 FINAL 10/13/2021 0511    Cardiac Enzymes: No results for input(s): "CKTOTAL", "CKMB", "CKMBINDEX", "TROPONINI" in the last 168 hours. CBG: No results for input(s): "GLUCAP" in the last 168 hours. Iron Studies: No results for input(s): "IRON", "TIBC", "TRANSFERRIN", "FERRITIN" in the last 72 hours. @lablastinr3 @ Studies/Results: DG Chest Port 1 View Result Date: 01/29/2023 CLINICAL DATA:  62 year old female with diarrhea, lethargy, fatigue. Dialysis patient. EXAM: PORTABLE CHEST 1 VIEW COMPARISON:  Chest radiographs 12/29/2022 and earlier. FINDINGS: Portable AP semi upright view at 1212 hours. Stable cardiomegaly and mediastinal contours. Calcified aortic atherosclerosis. Levoconvex thoracic scoliosis and large lung volumes again noted. No pneumothorax, pulmonary edema, pleural effusion or confluent lung opacity. Visualized tracheal air column is within normal limits. Right subclavian vascular stent again noted. Paucity of bowel gas the visible abdomen. IMPRESSION: 1.  No acute cardiopulmonary abnormality. 2. Cardiomegaly.  Aortic Atherosclerosis (ICD10-I70.0). Electronically Signed   By: Odessa Fleming M.D.   On: 01/29/2023 12:36   Medications:  iron sucrose      (feeding supplement) PROSource Plus  30 mL Oral BID BM   atorvastatin  40 mg Oral QHS   carvedilol  12.5 mg Oral BID  cinacalcet  30 mg Oral Q M,W,F-HD   doxercalciferol  3 mcg Intravenous Q M,W,F-HD   feeding supplement (NEPRO CARB STEADY)  237 mL Oral BID BM   ferric citrate  210 mg Oral TID WC   heparin  5,000 Units Subcutaneous Q8H   isosorbide mononitrate  30 mg Oral Daily   losartan  25 mg Oral Daily   multivitamin  1 tablet Oral QHS   pantoprazole  40 mg Oral BID     Dialysis Orders: Center: [facility]  on MWF . 3:15 hr 160NRe 350/Autoflow 1.5 57.8 kg 2.0 K/ 2.0 Ca AVF - Heparin 1200 units IV three times per week - Hectorol 3 mcg  IV three  times per week - Cinacalcet 30 mg PO three times per week - Mircera 100 mcg IV q 2 weeks (last dose 01/25/2023) - Venofer 100 mg IV X 5 (3/5 doses given)      Assessment/Plan: Generalized Weakness/Diarrhea-work up per primary. BP is elevated and has missed HD so holding off giving IVF at this time. She does not have symptoms of overt uremia. SCr 12.4 BUN 48. Her Kt/V at OP center is excellent.  Hypokalemia- KCL 40 MEQ PO per ED MD 01/29/2023. K+ 4.2 this AM Changed to 2.0 K bath.   ESRD -  MWF. Last HD 01/25/2023. HD early AM 01/30/2023. She does not have urgent HD needs at this time.   Hypertension/volume  - BP elevated. On Carvedilol 12.5 mg PO BID. Resume when eating. No signs of volume excess by CXR or exam. Does have very trace ankle edema but lungs are clear. UF as tolerated.   Anemia  - HGB 9.5 Recent ESA dose 01/25/2023. Finish Fe load here.   Metabolic bone disease -  Continue Auryxia 1 tab PO TID AC, sensipar, VDRA.   Nutrition - Albumin is low. Will add protein supplements when patient is eating.    Gordan Grell H. Beata Beason NP-C 01/30/2023, 9:03 AM  [employer] [phone]

## 2023-01-30 NOTE — Progress Notes (Addendum)
PROGRESS NOTE  Kendra Clay  ZOX:[id number] DOB: [birth year] DOA: 01/29/2023 PCP: Patient, [name]   Brief Narrative: Patient is a 62 year old female with history of ESRD on dialysis on MWF schedule, hypertension, coronary disease, polycystic kidney disease, normocytic anemia, COPD, combined systolic/diastolic CHF who presented for the evaluation of weakness, fatigue.  She missed her dialysis session on 12/24 because her car broke down.  After that she became lethargic.  On presentation, she was hemodynamically stable, mildly hypertensive.  Nephrology consulted for dialysis.PT/OT consulted  Assessment & Plan:  Principal Problem:   Generalized weakness Active Problems:   ESRD on dialysis (HCC)   Hypokalemia   Other fatigue  Generalized weakness/lethargy: Likely in the setting of missing hemodialysis session and decreased oral intake.  Reported diarrhea at home.  Diarrhea has resolved.  Electrolytes stable. PT/OT evaluation requested.She ambulates with the help of her lives with her son  ESRD on dialysis: Missed dialysis session on 12/24.  Currently being dialyzed.  Nephrology following.  No signs of uremia.  Hypertension: Currently blood pressure stable.  Continue Coreg, losartan, Imdur  Protein calorie malnutrition: Hemoglobin of 2.6.  Dietitian consulted  Vitamin D deficiency: Started on supplementation, doxercalciferol         DVT prophylaxis:heparin injection 5,000 Units Start: 01/29/23 1700     Code Status: Full Code  Family Communication: None at bedside  Patient status:Inpatient  Patient is from :home  Anticipated discharge WJ:XBJY  Estimated DC date:tomorrow   Consultants: Nephrology  Procedures:Dialysis  Antimicrobials:  Anti-infectives (From admission, onward)    None       Subjective: Patient seen and examined at bedside today.  She appears comfortable.  She was in dialysis.  She says she is still feeling weak but feels better than  yesterday.  No diarrhea or abdominal pain.  Objective: Vitals:   01/30/23 0930 01/30/23 1000 01/30/23 1030 01/30/23 1100  BP: 127/68 109/71 99/66 124/69  Pulse: 73 74 66 71  Resp: (!) 25 19 18 20   Temp:      TempSrc:      SpO2: 100% 100% 100% 100%  Weight:      Height:        Intake/Output Summary (Last 24 hours) at 01/30/2023 1134 Last data filed at 01/30/2023 0600 Gross per 24 hour  Intake 120 ml  Output 0 ml  Net 120 ml   Filed Weights   01/29/23 1031 01/29/23 1758 01/30/23 0802  Weight: 55 kg 57.3 kg 56.2 kg    Examination:  General exam: Overall comfortable, not in distress,weak appearing  HEENT: PERRL Respiratory system:  no wheezes or crackles  Cardiovascular system: S1 & S2 heard, RRR.  Gastrointestinal system: Abdomen is nondistended, soft and nontender. Central nervous system: Alert and oriented Extremities: No edema, no clubbing ,no cyanosis, AV fistula on the right upper extremity Skin: No rashes, no ulcers,no icterus     Data Reviewed: I have personally reviewed following labs and imaging studies  CBC: Recent Labs  Lab 01/29/23 1239 01/30/23 0717  WBC 5.4 4.4  NEUTROABS 3.7  --   HGB 9.4* 9.5*  HCT 30.8* 30.3*  MCV 91.1 87.8  PLT 124* 117*   Basic Metabolic Panel: Recent Labs  Lab 01/29/23 1239 01/30/23 0717  NA 131* 134*  K 3.0* 4.2  CL 91* 95*  CO2 27 25  GLUCOSE 87 90  BUN 48* 53*  CREATININE 12.40* 13.73*  CALCIUM 8.7* 8.6*  MG 1.7  --   PHOS 3.3 3.7  No results found for this or any previous visit (from the past 240 hours).   Radiology Studies: DG Chest Port 1 View Result Date: 01/29/2023 CLINICAL DATA:  62 year old female with diarrhea, lethargy, fatigue. Dialysis patient. EXAM: PORTABLE CHEST 1 VIEW COMPARISON:  Chest radiographs 12/29/2022 and earlier. FINDINGS: Portable AP semi upright view at 1212 hours. Stable cardiomegaly and mediastinal contours. Calcified aortic atherosclerosis. Levoconvex thoracic scoliosis and  large lung volumes again noted. No pneumothorax, pulmonary edema, pleural effusion or confluent lung opacity. Visualized tracheal air column is within normal limits. Right subclavian vascular stent again noted. Paucity of bowel gas the visible abdomen. IMPRESSION: 1.  No acute cardiopulmonary abnormality. 2. Cardiomegaly.  Aortic Atherosclerosis (ICD10-I70.0). Electronically Signed   By: Odessa Fleming M.D.   On: 01/29/2023 12:36    Scheduled Meds:  (feeding supplement) PROSource Plus  30 mL Oral BID BM   atorvastatin  40 mg Oral QHS   carvedilol  12.5 mg Oral BID   cinacalcet  30 mg Oral Q M,W,F-HD   doxercalciferol  3 mcg Intravenous Q M,W,F-HD   feeding supplement (NEPRO CARB STEADY)  237 mL Oral BID BM   ferric citrate  210 mg Oral TID WC   heparin  5,000 Units Subcutaneous Q8H   isosorbide mononitrate  30 mg Oral Daily   losartan  25 mg Oral Daily   multivitamin  1 tablet Oral QHS   pantoprazole  40 mg Oral BID   Continuous Infusions:  iron sucrose Stopped (01/30/23 1040)     LOS: 1 day   Burnadette Pop, MD [facility] P12/27/2024, 11:34 AM

## 2023-01-30 NOTE — Plan of Care (Signed)
  Problem: Fluid Volume: Goal: Compliance with measures to maintain balanced fluid volume will improve Outcome: Progressing   Problem: Health Behavior/Discharge Planning: Goal: Ability to manage health-related needs will improve Outcome: Progressing   Problem: Nutritional: Goal: Ability to make healthy dietary choices will improve Outcome: Progressing   Problem: Clinical Measurements: Goal: Complications related to the disease process, condition or treatment will be avoided or minimized Outcome: Progressing   Problem: Education: Goal: Knowledge of General Education information will improve Description: Including pain rating scale, medication(s)/side effects and non-pharmacologic comfort measures Outcome: Progressing   Problem: Health Behavior/Discharge Planning: Goal: Ability to manage health-related needs will improve Outcome: Progressing   Problem: Clinical Measurements: Goal: Ability to maintain clinical measurements within normal limits will improve Outcome: Progressing Goal: Will remain free from infection Outcome: Progressing Goal: Diagnostic test results will improve Outcome: Progressing Goal: Respiratory complications will improve Outcome: Progressing Goal: Cardiovascular complication will be avoided Outcome: Progressing   Problem: Activity: Goal: Risk for activity intolerance will decrease Outcome: Progressing   Problem: Nutrition: Goal: Adequate nutrition will be maintained Outcome: Progressing   Problem: Coping: Goal: Level of anxiety will decrease Outcome: Progressing   Problem: Elimination: Goal: Will not experience complications related to bowel motility Outcome: Progressing Goal: Will not experience complications related to urinary retention Outcome: Progressing   Problem: Pain Management: Goal: General experience of comfort will improve Outcome: Progressing   Problem: Safety: Goal: Ability to remain free from injury will improve Outcome:  Progressing   Problem: Skin Integrity: Goal: Risk for impaired skin integrity will decrease Outcome: Progressing

## 2023-01-30 NOTE — Progress Notes (Signed)
Received patient in bed to unit.  Alert and oriented.  Informed consent signed and in chart.   TX duration: 2 hours and 52 minutes.  Patient terminated session due feeling sick.  AMA paperwork signed, NP Manson Passey informed.  Transported back to the room  Alert, without acute distress.  Hand-off given to patient's nurse.   Access used: Right forearm Fistula Access issues: none  Total UF removed: 1.2L Medication(s) given: Iron Sucrose, Sensipar, Hectoral   01/30/23 1145  Vitals  Temp 97.8 F (36.6 C)  Temp Source Oral  BP 100/63  MAP (mmHg) 74  Pulse Rate 69  ECG Heart Rate 70  Resp 17  Oxygen Therapy  SpO2 100 %  O2 Device Room Air  During Treatment Monitoring  HD Safety Checks Performed Yes  Intra-Hemodialysis Comments  (Terminated session early.  AMA paperwork signed.  NP Alonna Buckler informed.)  Dialysis Fluid Bolus Normal Saline  Bolus Amount (mL) 300 mL  Post Treatment  Dialyzer Clearance Lightly streaked  Liters Processed 60.2  Fluid Removed (mL) 1200 mL  Tolerated HD Treatment No (Comment)  Post-Hemodialysis Comments Patient felt sick, terminated session early.  AMA paperwork signed.  NP Manson Passey informed  Fistula / Graft Right Forearm  Placement Date/Time: (c)  (c)    Orientation: Right  Access Location: Forearm  Status Deaccessed     Stacie Glaze LPN Kidney Dialysis Unit

## 2023-01-30 NOTE — Progress Notes (Addendum)
PT Cancellation Note  Patient Details Name: Kendra Clay MRN: [id number] DOB: 27-Mar-1960   Cancelled Treatment:    Reason Eval/Treat Not Completed: Patient at procedure or test/unavailable. Upon my arrival to the unit, pt had just left the floor for HD. Will continue to follow and evaluate as time/schedule allows.   Addendum 13:25: Pt reports she is not feeling well from HD, and not able to complete any mobility to allow for PT evaluation at this time, will continue to follow and evaluate as time/schedule allows.   Vickki Muff, PT, DPT   Acute Rehabilitation Department Office [phone] Secure Chat Communication Preferred\   Ronnie Derby 01/30/2023, 8:37 AM

## 2023-01-30 NOTE — Progress Notes (Addendum)
Initial Nutrition Assessment  DOCUMENTATION CODES:   Non-severe (moderate) malnutrition in context of chronic illness  INTERVENTION:  -Continue Prosource BID between meals -Discontinue Nepro BID between meals - Bedtime snack  - Boost Breeze BID between meals - Magic Cup TID with meals - Continue renal MVI  NUTRITION DIAGNOSIS:   Moderate Malnutrition related to chronic illness as evidenced by moderate fat depletion, severe muscle depletion, energy intake < 75% for > or equal to 1 month.  GOAL:  Patient will meet greater than or equal to 90% of their needs   MONITOR:  PO intake, Supplement acceptance, Weight trends  REASON FOR ASSESSMENT:  Consult Assessment of nutrition requirement/status  ASSESSMENT:   62 y.o. female presented to ED c/o diarrhea and fatigue x1 week. Endorses loss of appetite during that time. Receives ICHD 3x week MWF. Last dialysis tx 12/22. 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   Potassium low in ED. Supplemented with KCl. K+ now WDL. No other electrolyte abnormalities. IVFs on hold.   Adequate appetite reported prior to nausea/diarrhea started a week ago, but appears to be inadequate at baseline. Ate 75% of her lunch meal.   24 Hour Recall: B: 1/2 a sandwich L: 1/2 sandwich D: frozen meal Snack: chocolate  Seen by nephrology on HD this morning. Endorses appetite is returning. Refusing Nepro and asking to have it discontinued. Open to [employer]. Bedtime snack ordered and recommend continuing to increase protein/calorie intake.   Admit Weight: 57.3kg Current Weight: 55kg  Reports TW at dialysis facility was 62kg.  Left HD tx today with post weight of 55 kg. While this wt loss would be considered significant, being unsure of patient's usual fluid status, difficult to assess whether this is true body weight loss or fluid-related. Will continue to monitor wt trend during admission. Usual UFs range  between 2-3L. Today she achieved 1.2L UF before c/o feeling ill and terminating tx early. Has been on iHD x10 years.  Independent with ADLs at baseline. Abdomen does appear distended d/t PKD dx.   Labs: Na 134 CBGs 87-90 x2 draws Vitamin D 21.55  Meds: cinacalcet, hectorol, mircera, venofer, auryxia, renal vitamin, pantoprazole     NUTRITION - FOCUSED PHYSICAL EXAM:  Flowsheet Row Most Recent Value  Orbital Region Mild depletion  Upper Arm Region Moderate depletion  Thoracic and Lumbar Region Moderate depletion  Buccal Region No depletion  Temple Region Moderate depletion  Clavicle Bone Region Severe depletion  Clavicle and Acromion Bone Region Severe depletion  Scapular Bone Region Severe depletion  Dorsal Hand Moderate depletion  Patellar Region Moderate depletion  Anterior Thigh Region Severe depletion  Posterior Calf Region Moderate depletion  Edema (RD Assessment) None  Hair Reviewed  Eyes Reviewed  Mouth Reviewed  Skin Reviewed  Nails Reviewed       Diet Order:   Diet Order             Diet renal with fluid restriction Fluid restriction: 1200 mL Fluid; Room service appropriate? Yes; Fluid consistency: Thin  Diet effective now             EDUCATION NEEDS:   Education needs have been addressed  Skin:  Skin Assessment: Reviewed RN Assessment  Last BM:  12/25 PTA  Height:  Ht Readings from Last 1 Encounters:  01/29/23 5\' 3"  (1.6 m)   Weight:  Wt Readings from Last 1 Encounters:  01/30/23 55 kg    Ideal Body Weight:  52.3 kg  BMI:  Body mass index is 21.48 kg/m.  Estimated Nutritional Needs:   Kcal:  1600-1800 kcal  Protein:  70-80 g  Fluid:  1L + UOP  Myrtie Cruise MS, RD, LDN Registered Dietitian Clinical Nutrition RD Inpatient Contact Info in Amion

## 2023-01-31 DIAGNOSIS — R531 Weakness: Secondary | ICD-10-CM | POA: Diagnosis not present

## 2023-01-31 LAB — RENAL FUNCTION PANEL
Albumin: 2.6 g/dL — ABNORMAL LOW (ref 3.5–5.0)
Anion gap: 13 (ref 5–15)
BUN: 32 mg/dL — ABNORMAL HIGH (ref 8–23)
CO2: 25 mmol/L (ref 22–32)
Calcium: 8.3 mg/dL — ABNORMAL LOW (ref 8.9–10.3)
Chloride: 94 mmol/L — ABNORMAL LOW (ref 98–111)
Creatinine, Ser: 8.53 mg/dL — ABNORMAL HIGH (ref 0.44–1.00)
GFR, Estimated: 5 mL/min — ABNORMAL LOW (ref >60)
Glucose, Bld: 122 mg/dL — ABNORMAL HIGH (ref 70–99)
Phosphorus: 3.1 mg/dL (ref 2.5–4.6)
Potassium: 3.4 mmol/L — ABNORMAL LOW (ref 3.5–5.1)
Sodium: 132 mmol/L — ABNORMAL LOW (ref 135–145)

## 2023-01-31 LAB — CBC
HCT: 31.1 % — ABNORMAL LOW (ref 36.0–46.0)
Hemoglobin: 9.6 g/dL — ABNORMAL LOW (ref 12.0–15.0)
MCH: 27.4 pg (ref 26.0–34.0)
MCHC: 30.9 g/dL (ref 30.0–36.0)
MCV: 88.9 fL (ref 80.0–100.0)
Platelets: 106 10*3/uL — ABNORMAL LOW (ref 150–400)
RBC: 3.5 MIL/uL — ABNORMAL LOW (ref 3.87–5.11)
RDW: 15.8 % — ABNORMAL HIGH (ref 11.5–15.5)
WBC: 4.6 10*3/uL (ref 4.0–10.5)
nRBC: 0 % (ref 0.0–0.2)

## 2023-01-31 LAB — HEPATITIS B SURFACE ANTIBODY, QUANTITATIVE: Hep B S AB Quant (Post): 34.5 m[IU]/mL

## 2023-01-31 MED ORDER — HEPARIN SODIUM (PORCINE) 1000 UNIT/ML IJ SOLN: 1200.0000 [IU] | Freq: Once | INTRAMUSCULAR | Status: AC

## 2023-01-31 MED ORDER — HEPARIN SODIUM (PORCINE) 1000 UNIT/ML IJ SOLN
INTRAMUSCULAR | Status: AC
  Administered 2023-01-31: 1200 [IU] via INTRAVENOUS
  Filled 2023-01-31: qty 2

## 2023-01-31 NOTE — TOC Progression Note (Signed)
Transition of Care [facility]) - Progression Note    Patient Details  Name: Kendra Clay MRN: [id number] Date of Birth: [birth year]  Transition of Care [facility]) CM/SW Contact  Dellie Burns [place], [place] Phone Number: 01/31/2023, 2:06 PM  Clinical Narrative: confirmed with pt's son pt is active with [facility] for medical appointments including HD.   Dellie Burns, MSW, LCSW [phone] (coverage)          Barriers to Discharge: No Barriers Identified  Expected Discharge Plan and Services                                   HH Arranged: PT [facility] Agency: [facility] Date [facility] Agency Contacted: 01/31/23 Time HH Agency Contacted: 1202 Representative spoke with at [facility] Agency: Cindie   Social Determinants of Health (SDOH) Interventions SDOH Screenings   Food Insecurity: No Food Insecurity (01/29/2023)  Housing: Low Risk  (01/29/2023)  Transportation Needs: No Transportation Needs (01/29/2023)  Utilities: Not At Risk (01/29/2023)  Tobacco Use: Medium Risk (01/29/2023)    Readmission Risk Interventions    10/15/2021    4:23 PM  Readmission Risk Prevention Plan  Transportation Screening Complete  PCP or Specialist Appt within 3-5 Days Complete  HRI or Home Care Consult Complete  Social Work Consult for Recovery Care Planning/Counseling Complete  Palliative Care Screening Not Applicable  Medication Review Oceanographer) Complete

## 2023-01-31 NOTE — TOC Transition Note (Signed)
Transition of Care [facility]) - Discharge Note   Patient Details  Name: Kendra Clay MRN: [id number] Date of Birth: 06-May-1960  Transition of Care [facility]) CM/SW Contact:  Ronny Bacon, RN Phone Number: 01/31/2023, 12:05 PM   Clinical Narrative:    Secure chat received from OT regarding need for HHPT. HHPT arranged through Cindie with [facility].   Final next level of care: Home w Home Health Services Barriers to Discharge: No Barriers Identified   Patient Goals and CMS Choice            Discharge Placement                       Discharge Plan and Services Additional resources added to the After Visit Summary for                            [facility] Arranged: PT HH Agency: [facility] Date [facility] Agency Contacted: 01/31/23 Time HH Agency Contacted: 1202 Representative spoke with at [facility] Agency: Cindie  Social Drivers of Health (SDOH) Interventions SDOH Screenings   Food Insecurity: No Food Insecurity (01/29/2023)  Housing: Low Risk  (01/29/2023)  Transportation Needs: No Transportation Needs (01/29/2023)  Utilities: Not At Risk (01/29/2023)  Tobacco Use: Medium Risk (01/29/2023)     Readmission Risk Interventions    10/15/2021    4:23 PM  Readmission Risk Prevention Plan  Transportation Screening Complete  PCP or Specialist Appt within 3-5 Days Complete  HRI or Home Care Consult Complete  Social Work Consult for Recovery Care Planning/Counseling Complete  Palliative Care Screening Not Applicable  Medication Review Oceanographer) Complete

## 2023-01-31 NOTE — Procedures (Signed)
HD Note:  Some information was entered later than the data was gathered due to patient care needs. The stated time with the data is accurate.  Received patient in bed to unit.  Patient stated that she only used 16 gauge needles and that the temperature needed to be high.  Jerel Shepherd, PA was consulted and agreed to the change of both things per the patient's preference. As well as lowering the BFR to 350 related to the smaller gauge needle.  Alert and oriented.   Informed consent signed and in chart.   Access used: Left forearm fistula Access issues: BFR had to be reduced related to venous pressure to 300.  TX duration:  2 hours and 50 min.  Patient decided to stop treatment.  AMA for end of treatment signed.  Alert, without acute distress. Patient orders were to keep the patient even.  Her decision to end treatment early resulted in her getting 100 ml of fluid from the automatic flush that did not get recovered.  Hand-off given to patient's nurse.   Transported back to the room   Lacresia Darwish L. Dareen Piano, RN Kidney Dialysis Unit.

## 2023-01-31 NOTE — Progress Notes (Signed)
Patient requested to end dialysis tx early due to frequent machine alarming. AMA signed.

## 2023-01-31 NOTE — Evaluation (Signed)
Physical Therapy Evaluation Patient Details Name: Kendra Clay MRN: [id number] DOB: [birth year] Today's Date: 01/31/2023  History of Present Illness  The pt is a 62 yo female presenting 12/26 for lethargy, diarrhea x1 week. PMH includes: enlarged liver, ESRD on HD MWF, HTN, polycystic kidney disease, scoliosis, ORIF L femur and L wrist 08/2021, L tibia IM nail 05/2018, and R tibia IM nail 08/2021.  Clinical Impression  Pt presents to PT with deficits in strength, power, endurance, gait. Pt is able to tranafer and ambulate for very short distances with support of RW, however she fatigues very quickly. PT encourages frequent mobilization in an effort to improve activity tolerance. PT will continue to follow in the acute setting, recommending HHPT follow-up.        If plan is discharge home, recommend the following: A little help with bathing/dressing/bathroom;Assistance with cooking/housework;Help with stairs or ramp for entrance   Can travel by private vehicle        Equipment Recommendations None recommended by PT  Recommendations for Other Services       Functional Status Assessment Patient has had a recent decline in their functional status and demonstrates the ability to make significant improvements in function in a reasonable and predictable amount of time.     Precautions / Restrictions Precautions Precautions: Fall Restrictions Weight Bearing Restrictions Per Provider Order: No      Mobility  Bed Mobility Overal bed mobility: Needs Assistance Bed Mobility: Supine to Sit, Sit to Supine     Supine to sit: Supervision Sit to supine: Supervision        Transfers Overall transfer level: Needs assistance Equipment used: Rolling walker (2 wheels) Transfers: Sit to/from Stand Sit to Stand: Contact guard assist                Ambulation/Gait Ambulation/Gait assistance: Contact guard assist Gait Distance (Feet): 15 Feet Assistive device: Rolling walker (2  wheels) Gait Pattern/deviations: Step-to pattern Gait velocity: reduced Gait velocity interpretation: <1.31 ft/sec, indicative of household ambulator   General Gait Details: slowed step-to gait  Stairs            Wheelchair Mobility     Tilt Bed    Modified Rankin (Stroke Patients Only)       Balance Overall balance assessment: Needs assistance Sitting-balance support: No upper extremity supported, Feet supported Sitting balance-Leahy Scale: Good     Standing balance support: Bilateral upper extremity supported, Reliant on assistive device for balance Standing balance-Leahy Scale: Poor                               Pertinent Vitals/Pain Pain Assessment Pain Assessment: No/denies pain    Home Living Family/patient expects to be discharged to:: Private residence Living Arrangements: Children Available Help at Discharge: Family;Available 24 hours/day Type of Home: House Home Access: Ramped entrance       Home Layout: One level Home Equipment: Agricultural consultant (2 wheels);Wheelchair - Manufacturing systems engineer      Prior Function Prior Level of Function : Independent/Modified Independent;Driving             Mobility Comments: ambulates with RW for household distances primarily       Extremity/Trunk Assessment   Upper Extremity Assessment Upper Extremity Assessment: Overall WFL for tasks assessed    Lower Extremity Assessment Lower Extremity Assessment: Generalized weakness    Cervical / Trunk Assessment Cervical / Trunk Assessment: Kyphotic  Communication   Communication Communication:  No apparent difficulties Cueing Techniques: Verbal cues  Cognition Arousal: Alert Behavior During Therapy: WFL for tasks assessed/performed Overall Cognitive Status: Within Functional Limits for tasks assessed                                          General Comments General comments (skin integrity, edema, etc.): VSS on RA     Exercises     Assessment/Plan    PT Assessment Patient needs continued PT services  PT Problem List Decreased strength;Decreased activity tolerance;Decreased balance;Decreased mobility       PT Treatment Interventions DME instruction;Gait training;Functional mobility training;Therapeutic activities;Therapeutic exercise;Balance training;Neuromuscular re-education;Patient/family education    PT Goals (Current goals can be found in the Care Plan section)  Acute Rehab PT Goals Patient Stated Goal: to return to prior level of function, improve activity tolerance PT Goal Formulation: With patient Time For Goal Achievement: 02/14/23 Potential to Achieve Goals: Good    Frequency Min 1X/week     Co-evaluation               AM-PAC PT "6 Clicks" Mobility  Outcome Measure Help needed turning from your back to your side while in a flat bed without using bedrails?: A Little Help needed moving from lying on your back to sitting on the side of a flat bed without using bedrails?: A Little Help needed moving to and from a bed to a chair (including a wheelchair)?: A Little Help needed standing up from a chair using your arms (e.g., wheelchair or bedside chair)?: A Little Help needed to walk in hospital room?: A Little Help needed climbing 3-5 steps with a railing? : Total 6 Click Score: 16    End of Session Equipment Utilized During Treatment: Gait belt Activity Tolerance: Patient limited by fatigue Patient left: in bed;with call bell/phone within reach Nurse Communication: Mobility status PT Visit Diagnosis: Other abnormalities of gait and mobility (R26.89);Muscle weakness (generalized) (M62.81)    Time: 0981-1914 PT Time Calculation (min) (ACUTE ONLY): 12 min   Charges:   PT Evaluation $PT Eval Low Complexity: 1 Low   PT General Charges $$ ACUTE PT VISIT: 1 Visit         Arlyss Gandy, PT, DPT Acute Rehabilitation Office [phone]   Arlyss Gandy 01/31/2023, 11:23  AM

## 2023-01-31 NOTE — Plan of Care (Signed)
  Problem: Fluid Volume: Goal: Compliance with measures to maintain balanced fluid volume will improve Outcome: Completed/Met

## 2023-01-31 NOTE — Progress Notes (Signed)
Subjective: No complaints seen in room.  Okay.  Feels stable for discharge  Objective Vital signs in last 24 hours: Vitals:   01/30/23 1631 01/30/23 2007 01/31/23 0427 01/31/23 0830  BP: 119/69 115/64 123/70 (!) 100/58  Pulse: 83 77 70 78  Resp: 18 20 20 18   Temp: 98.2 F (36.8 C) 98.2 F (36.8 C) 98 F (36.7 C) 98 F (36.7 C)  TempSrc: Oral Oral Oral   SpO2: 97% 97% 94% 94%  Weight:      Height:       Weight change: 1.2 kg  Physical Exam: General: Alert ill thin female NAD Heart: RRR no MRG Lungs: CTA bilaterally nonlabored Abdomen: NABS soft NT ND Extremities: No pedal edema Dialysis Access: Right arm AV fistula slight aneurysmal positive bruit  Dialysis Orders: Center: [facility]  on MWF . 3:15 hr 160NRe 350/Autoflow 1.5 57.8 kg 2.0 K/ 2.0 Ca AVF - Heparin 1200 units IV three times per week - Hectorol 3 mcg  IV three times per week - Cinacalcet 30 mg PO three times per week - Mircera 100 mcg IV q 2 weeks (last dose 01/25/2023) - Venofer 100 mg IV X 5 (3/5 doses given)  Problem/Plan: Generalized weakness/diarrhea= resolving per primary Hypokalemia= last K4.2 improved with supplements, dialyze on 4K bath as outpatient follow-up trend ESRD -HD MWF, HD tolerated yesterday 1.2 L UF/next HD per holiday schedule Sunday, if not able to obtain ride home today we will dialyze tonight otherwise HD tomorrow HTN/volume -BP controlled today on meds and and UF yesterday at 1.2 L Anemia -Hgb 9.5 ESA dosed 01/25/2023 iron load in progress Secondary hyperparathyroidism -calcium and phosphorus at goal on binder and Sensipar and VDRA  Disposition= okay for discharge per renal today/plan as discussed with Dr. Glenna Fellows and patient if discharge, outpatient HD tomorrow-, patient having car troubles and not able to get a ride will dialyze tonight.  At hospital then or discharge in a.m.  Lenny Pastel, PA-C [facility] [phone] 01/31/2023,11:38 AM  LOS: 2  days   Labs: Basic Metabolic Panel: Recent Labs  Lab 01/29/23 1239 01/30/23 0717  NA 131* 134*  K 3.0* 4.2  CL 91* 95*  CO2 27 25  GLUCOSE 87 90  BUN 48* 53*  CREATININE 12.40* 13.73*  CALCIUM 8.7* 8.6*  PHOS 3.3 3.7   Liver Function Tests: Recent Labs  Lab 01/29/23 1239 01/30/23 0717  AST 11*  --   ALT 6  --   ALKPHOS 85  --   BILITOT 0.9  --   PROT 6.9  --   ALBUMIN 2.6* 2.3*   Recent Labs  Lab 01/29/23 1239  LIPASE 29   No results for input(s): "AMMONIA" in the last 168 hours. CBC: Recent Labs  Lab 01/29/23 1239 01/30/23 0717  WBC 5.4 4.4  NEUTROABS 3.7  --   HGB 9.4* 9.5*  HCT 30.8* 30.3*  MCV 91.1 87.8  PLT 124* 117*   Cardiac Enzymes: No results for input(s): "CKTOTAL", "CKMB", "CKMBINDEX", "TROPONINI" in the last 168 hours. CBG: No results for input(s): "GLUCAP" in the last 168 hours.  Studies/Results: DG Chest Port 1 View Result Date: 01/29/2023 CLINICAL DATA:  62 year old female with diarrhea, lethargy, fatigue. Dialysis patient. EXAM: PORTABLE CHEST 1 VIEW COMPARISON:  Chest radiographs 12/29/2022 and earlier. FINDINGS: Portable AP semi upright view at 1212 hours. Stable cardiomegaly and mediastinal contours. Calcified aortic atherosclerosis. Levoconvex thoracic scoliosis and large lung volumes again noted. No pneumothorax, pulmonary edema, pleural effusion or  confluent lung opacity. Visualized tracheal air column is within normal limits. Right subclavian vascular stent again noted. Paucity of bowel gas the visible abdomen. IMPRESSION: 1.  No acute cardiopulmonary abnormality. 2. Cardiomegaly.  Aortic Atherosclerosis (ICD10-I70.0). Electronically Signed   By: Odessa Fleming M.D.   On: 01/29/2023 12:36   Medications:  iron sucrose Stopped (01/30/23 1040)    (feeding supplement) PROSource Plus  30 mL Oral BID BM   atorvastatin  40 mg Oral QHS   carvedilol  12.5 mg Oral BID   cinacalcet  30 mg Oral Q M,W,F-HD   doxercalciferol  3 mcg Intravenous Q  M,W,F-HD   feeding supplement  1 Container Oral BID BM   ferric citrate  210 mg Oral TID WC   heparin  5,000 Units Subcutaneous Q8H   isosorbide mononitrate  30 mg Oral QHS   losartan  25 mg Oral QHS   multivitamin  1 tablet Oral QHS   pantoprazole  40 mg Oral BID

## 2023-01-31 NOTE — Progress Notes (Signed)
PROGRESS NOTE  Kendra Clay  VHQ:[id number] DOB: [birth year] DOA: 01/29/2023 PCP: Patient, [name]   Brief Narrative: Patient is a 62 year old female with history of ESRD on dialysis on MWF schedule, hypertension, coronary disease, polycystic kidney disease, normocytic anemia, COPD, combined systolic/diastolic CHF who presented for the evaluation of weakness, fatigue.  She missed her dialysis session on 12/24 because her car broke down.  After that she became lethargic.  On presentation, she was hemodynamically stable, mildly hypertensive.  Nephrology consulted for dialysis.PT/OT consulted, recommend home health.  Underwent dialysis on 12/27 and again planned for 12/28. plan for discharge tomorrow morning.  Son will come to pick her up in the morning  Assessment & Plan:  Principal Problem:   Generalized weakness Active Problems:   ESRD on dialysis (HCC)   Hypokalemia   Other fatigue  Generalized weakness/lethargy: Likely in the setting of missing hemodialysis session,hypokalemia and  decreased oral intake.  Reported diarrhea at home.  Diarrhea has resolved.  Electrolytes stable now. PT/OT evaluation requested.recommendation is home health.  She ambulates with the help of walker/cane,her lives with her son  ESRD on dialysis: Missed dialysis session on 12/24.    Nephrology following.  No signs of uremia.  Dialysis on 12/27, next session today  Hypertension: Currently blood pressure stable.  Continue Coreg, losartan, Imdur  Protein calorie malnutrition: Hemoglobin of 2.6.  Dietitian consulted  Vitamin D deficiency: Started on supplementation, doxercalciferol    Nutrition Problem: Moderate Malnutrition Etiology: chronic illness    DVT prophylaxis:heparin injection 5,000 Units Start: 01/29/23 1700     Code Status: Full Code  Family Communication: Called and discussed with son Sam on phone today  Patient status:Inpatient  Patient is from :home  Anticipated discharge  UX:LKGM  Estimated DC date:tomorrow a.m.   Consultants: Nephrology  Procedures:Dialysis  Antimicrobials:  Anti-infectives (From admission, onward)    None       Subjective: Patient seen and examined at bedside today.  Hemodynamically stable.  Feels better today.  Not weak like yesterday.  Patient does not have a ride for dialysis tomorrow at her dialysis center so she is being dialyzed today.  Since dialysis started late today, it may not be possible for discharge today.  Plan for discharge in a.m.  Son aware about the plan.  He will come to pick her up  Objective: Vitals:   01/30/23 2007 01/31/23 0427 01/31/23 0830 01/31/23 1430  BP: 115/64 123/70 (!) 100/58   Pulse: 77 70 78 73  Resp: 20 20 18 16   Temp: 98.2 F (36.8 C) 98 F (36.7 C) 98 F (36.7 C)   TempSrc: Oral Oral    SpO2: 97% 94% 94% 96%  Weight:    55 kg  Height:        Intake/Output Summary (Last 24 hours) at 01/31/2023 1433 Last data filed at 01/31/2023 1100 Gross per 24 hour  Intake 1306.05 ml  Output 0 ml  Net 1306.05 ml   Filed Weights   01/30/23 0802 01/30/23 1155 01/31/23 1430  Weight: 56.2 kg 55 kg 55 kg    Examination:  General exam: Overall comfortable, not in distress, lying in bed HEENT: PERRL Respiratory system:  no wheezes or crackles  Cardiovascular system: S1 & S2 heard, RRR.  Gastrointestinal system: Abdomen is nondistended, soft and nontender. Central nervous system: Alert and oriented Extremities: No edema, no clubbing ,no cyanosis, AV fistula on the right upper extremity Skin: No rashes, no ulcers,no icterus     Data Reviewed:  I have personally reviewed following labs and imaging studies  CBC: Recent Labs  Lab 01/29/23 1239 01/30/23 0717 01/31/23 1353  WBC 5.4 4.4 4.6  NEUTROABS 3.7  --   --   HGB 9.4* 9.5* 9.6*  HCT 30.8* 30.3* 31.1*  MCV 91.1 87.8 88.9  PLT 124* 117* 106*   Basic Metabolic Panel: Recent Labs  Lab 01/29/23 1239 01/30/23 0717  NA 131* 134*   K 3.0* 4.2  CL 91* 95*  CO2 27 25  GLUCOSE 87 90  BUN 48* 53*  CREATININE 12.40* 13.73*  CALCIUM 8.7* 8.6*  MG 1.7  --   PHOS 3.3 3.7     No results found for this or any previous visit (from the past 240 hours).   Radiology Studies: No results found.   Scheduled Meds:  (feeding supplement) PROSource Plus  30 mL Oral BID BM   atorvastatin  40 mg Oral QHS   carvedilol  12.5 mg Oral BID   cinacalcet  30 mg Oral Q M,W,F-HD   doxercalciferol  3 mcg Intravenous Q M,W,F-HD   feeding supplement  1 Container Oral BID BM   ferric citrate  210 mg Oral TID WC   heparin  5,000 Units Subcutaneous Q8H   isosorbide mononitrate  30 mg Oral QHS   losartan  25 mg Oral QHS   multivitamin  1 tablet Oral QHS   pantoprazole  40 mg Oral BID   Continuous Infusions:  iron sucrose Stopped (01/30/23 1040)     LOS: 2 days   Burnadette Pop, MD [facility] P12/28/2024, 2:33 PM

## 2023-02-01 DIAGNOSIS — R531 Weakness: Secondary | ICD-10-CM | POA: Diagnosis not present

## 2023-02-01 MED ORDER — ISOSORBIDE MONONITRATE ER 30 MG PO TB24: 15.0000 mg | ORAL_TABLET | Freq: Every day | ORAL | Status: AC

## 2023-02-01 MED ORDER — POTASSIUM CHLORIDE CRYS ER 20 MEQ PO TBCR
40.0000 meq | EXTENDED_RELEASE_TABLET | Freq: Once | ORAL | Status: AC
  Administered 2023-02-01: 40 meq via ORAL
  Filled 2023-02-01: qty 2

## 2023-02-01 MED ORDER — LOSARTAN POTASSIUM 25 MG PO TABS: 12.5000 mg | ORAL_TABLET | Freq: Every day | ORAL | Status: AC

## 2023-02-01 NOTE — Discharge Summary (Signed)
Physician Discharge Summary  Kendra Clay ZOX:[id number] DOB: [birth year] DOA: 01/29/2023  PCP: Patient, [name]  Admit date: 01/29/2023 Discharge date: 02/01/2023  Admitted From: Home Disposition:  Home  Discharge Condition:Stable CODE STATUS:FULL Diet recommendation:Renal  Brief/Interim Summary: Patient is a 62 year old female with history of ESRD on dialysis on MWF schedule, hypertension, coronary disease, polycystic kidney disease, normocytic anemia, COPD, combined systolic/diastolic CHF who presented for the evaluation of weakness, fatigue.  She missed her dialysis session on 12/24 because her car broke down.  After that she became lethargic.  On presentation, she was hemodynamically stable, mildly hypertensive.  Nephrology consulted for dialysis.PT/OT consulted, recommend home health.  Underwent dialysis on 12/27 and  12/28.  Hemodynamically stable for discharge to home today.  Following problems were addressed during the hospitalization:  Generalized weakness/lethargy: Likely in the setting of missing hemodialysis session,hypokalemia and  decreased oral intake.  Reported diarrhea at home.  Diarrhea has resolved.  Electrolytes stable now. PT/OT evaluation requested.recommendation is home health.  She ambulates with the help of walker/cane,her lives with her son   ESRD on dialysis: Missed dialysis session on 12/24.    Nephrology following.  No signs of uremia.  Dialyzed on 12/27, 12/28   Hypertension: Currently blood pressure stable/soft.  Continue Coreg, losartan, Imdur with reduced doses   Protein calorie malnutrition: Hemoglobin of 2.6.  Dietitian consulted   Vitamin D deficiency: Given doxercalciferol during dialysis   Discharge Diagnoses:  Principal Problem:   Generalized weakness Active Problems:   ESRD on dialysis (HCC)   Hypokalemia   Other fatigue    Discharge Instructions  Discharge Instructions     Diet - low sodium heart healthy   Complete by: As  directed    Discharge instructions   Complete by: As directed    1)Please take your medications as instructed 2)Monitor your blood pressure at home   Increase activity slowly   Complete by: As directed       Allergies as of 02/01/2023       Reactions   E-mycin [erythromycin] Hives   Sulfa Antibiotics Rash   Skin Rash   Lisinopril Cough        Medication List     STOP taking these medications    ondansetron 4 MG tablet Commonly known as: ZOFRAN       TAKE these medications    acetaminophen 500 MG tablet Commonly known as: TYLENOL Take 500 mg by mouth every 8 (eight) hours as needed for moderate pain. No more than 3 a day   atorvastatin 40 MG tablet Commonly known as: LIPITOR Take 40 mg by mouth at bedtime.   carvedilol 12.5 MG tablet Commonly known as: COREG Take 12.5 mg by mouth 2 (two) times daily.   Darbepoetin Alfa 150 MCG/0.3ML Sosy injection Commonly known as: ARANESP Inject 0.3 mLs (150 mcg total) into the vein every Wednesday with hemodialysis.   doxercalciferol 4 MCG/2ML injection Commonly known as: HECTOROL Inject 4 mLs (8 mcg total) into the vein every Monday, Wednesday, and Friday with hemodialysis.   ferric citrate 1 GM 210 MG(Fe) tablet Commonly known as: AURYXIA Take 210 mg by mouth 3 (three) times daily with meals.   fluticasone 50 MCG/ACT nasal spray Commonly known as: FLONASE Place 1 spray into both nostrils daily as needed for allergies or rhinitis.   isosorbide mononitrate 30 MG 24 hr tablet Commonly known as: IMDUR Take 0.5 tablets (15 mg total) by mouth daily. What changed: how much to take  loperamide 2 MG tablet Commonly known as: IMODIUM A-D Take 2 mg by mouth daily as needed for diarrhea or loose stools.   loratadine 10 MG tablet Commonly known as: CLARITIN Take 10 mg by mouth daily as needed for allergies.   losartan 25 MG tablet Commonly known as: COZAAR Take 0.5 tablets (12.5 mg total) by mouth daily. What  changed: how much to take   nitroGLYCERIN 0.4 MG SL tablet Commonly known as: NITROSTAT Place 0.4 mg under the tongue every 5 (five) minutes as needed for chest pain.   pantoprazole 40 MG tablet Commonly known as: PROTONIX Take 40 mg by mouth 2 (two) times daily.   polyethylene glycol powder 17 GM/SCOOP powder Commonly known as: MiraLax Start taking 1 capful 3 times a day. Slowly cut back as needed until you have normal bowel movements. What changed:  how much to take how to take this when to take this reasons to take this additional instructions   Refresh Relieva PF 0.5-1 % Soln Generic drug: Carboxymethylcell-Glycerin PF Place 1 drop into both eyes as needed (dry eyes).        Follow-up Information     [facility] Follow up.   Specialty: Home Health Services Why: Physical therapy. Office will call to arrange follow up after hosptial discharge. Contact information: [address] [place] [address] [phone]                Allergies  Allergen Reactions   E-Mycin [Erythromycin] Hives   Sulfa Antibiotics Rash    Skin Rash     Lisinopril Cough    Consultations: Nephrology   Procedures/Studies: DG Chest Port 1 View Result Date: 01/29/2023 CLINICAL DATA:  62 year old female with diarrhea, lethargy, fatigue. Dialysis patient. EXAM: PORTABLE CHEST 1 VIEW COMPARISON:  Chest radiographs 12/29/2022 and earlier. FINDINGS: Portable AP semi upright view at 1212 hours. Stable cardiomegaly and mediastinal contours. Calcified aortic atherosclerosis. Levoconvex thoracic scoliosis and large lung volumes again noted. No pneumothorax, pulmonary edema, pleural effusion or confluent lung opacity. Visualized tracheal air column is within normal limits. Right subclavian vascular stent again noted. Paucity of bowel gas the visible abdomen. IMPRESSION: 1.  No acute cardiopulmonary abnormality. 2. Cardiomegaly.  Aortic Atherosclerosis (ICD10-I70.0).  Electronically Signed   By: Odessa Fleming M.D.   On: 01/29/2023 12:36      Subjective: Patient seen and examined the bedside today.  Hemodynamically stable.  Overall comfortable.  Lying on bed.  Complains of some weakness but feels ready to go home.  I had called her son and discussed about discharge planning on 12/28  Discharge Exam: Vitals:   02/01/23 0527 02/01/23 0809  BP: 121/64 (!) 103/55  Pulse: 66 75  Resp:  17  Temp: 98.3 F (36.8 C) 98 F (36.7 C)  SpO2: 98% 99%   Vitals:   01/31/23 1826 01/31/23 1928 02/01/23 0527 02/01/23 0809  BP: 137/74 138/71 121/64 (!) 103/55  Pulse: 71 77 66 75  Resp: 13 20  17   Temp: 97.6 F (36.4 C) 98 F (36.7 C) 98.3 F (36.8 C) 98 F (36.7 C)  TempSrc:  Oral Oral Oral  SpO2: 97% 100% 98% 99%  Weight:      Height:        General: Pt is alert, awake, not in acute distress Cardiovascular: RRR, S1/S2 +, no rubs, no gallops Respiratory: CTA bilaterally, no wheezing, no rhonchi Abdominal: Soft, NT, ND, bowel sounds + Extremities: no edema, no cyanosis  The results of significant diagnostics from this hospitalization (including imaging, microbiology, ancillary and laboratory) are listed below for reference.     Microbiology: No results found for this or any previous visit (from the past 240 hours).   Labs: BNP (last 3 results) Recent Labs    12/29/22 1119 01/29/23 1239  BNP 2,023.8* 2,180.5*   Basic Metabolic Panel: Recent Labs  Lab 01/29/23 1239 01/30/23 0717 01/31/23 1353  NA 131* 134* 132*  K 3.0* 4.2 3.4*  CL 91* 95* 94*  CO2 27 25 25   GLUCOSE 87 90 122*  BUN 48* 53* 32*  CREATININE 12.40* 13.73* 8.53*  CALCIUM 8.7* 8.6* 8.3*  MG 1.7  --   --   PHOS 3.3 3.7 3.1   Liver Function Tests: Recent Labs  Lab 01/29/23 1239 01/30/23 0717 01/31/23 1353  AST 11*  --   --   ALT 6  --   --   ALKPHOS 85  --   --   BILITOT 0.9  --   --   PROT 6.9  --   --   ALBUMIN 2.6* 2.3* 2.6*   Recent Labs  Lab 01/29/23 1239   LIPASE 29   No results for input(s): "AMMONIA" in the last 168 hours. CBC: Recent Labs  Lab 01/29/23 1239 01/30/23 0717 01/31/23 1353  WBC 5.4 4.4 4.6  NEUTROABS 3.7  --   --   HGB 9.4* 9.5* 9.6*  HCT 30.8* 30.3* 31.1*  MCV 91.1 87.8 88.9  PLT 124* 117* 106*   Cardiac Enzymes: No results for input(s): "CKTOTAL", "CKMB", "CKMBINDEX", "TROPONINI" in the last 168 hours. BNP: Invalid input(s): "POCBNP" CBG: No results for input(s): "GLUCAP" in the last 168 hours. D-Dimer No results for input(s): "DDIMER" in the last 72 hours. Hgb A1c No results for input(s): "HGBA1C" in the last 72 hours. Lipid Profile No results for input(s): "CHOL", "HDL", "LDLCALC", "TRIG", "CHOLHDL", "LDLDIRECT" in the last 72 hours. Thyroid function studies Recent Labs    01/30/23 0717  TSH 1.314   Anemia work up Recent Labs    01/30/23 0717  VITAMINB12 237   Urinalysis    Component Value Date/Time   COLORURINE STRAW (A) 08/16/2017 2339   APPEARANCEUR CLEAR 08/16/2017 2339   LABSPEC 1.005 08/16/2017 2339   PHURINE 9.0 (H) 08/16/2017 2339   GLUCOSEU NEGATIVE 08/16/2017 2339   HGBUR SMALL (A) 08/16/2017 2339   BILIRUBINUR NEGATIVE 08/16/2017 2339   BILIRUBINUR neg 06/25/2011 1726   KETONESUR NEGATIVE 08/16/2017 2339   PROTEINUR 30 (A) 08/16/2017 2339   UROBILINOGEN 0.2 06/25/2011 1726   NITRITE NEGATIVE 08/16/2017 2339   LEUKOCYTESUR TRACE (A) 08/16/2017 2339   Sepsis Labs Recent Labs  Lab 01/29/23 1239 01/30/23 0717 01/31/23 1353  WBC 5.4 4.4 4.6   Microbiology No results found for this or any previous visit (from the past 240 hours).  Please note: You were cared for by a hospitalist during your hospital stay. Once you are discharged, your primary care physician will handle any further medical issues. Please note that NO REFILLS for any discharge medications will be authorized once you are discharged, as it is imperative that you return to your primary care physician (or  establish a relationship with a primary care physician if you do not have one) for your post hospital discharge needs so that they can reassess your need for medications and monitor your lab values.    Time coordinating discharge: 40 minutes  SIGNED:   Burnadette Pop, MD  [facility] 02/01/2023,  10:47 AM Pager [phone]  If 7PM-7AM, please contact night-coverage [email or web address] Password [email or web address]

## 2023-02-01 NOTE — TOC Transition Note (Signed)
Transition of Care [facility]) - Discharge Note   Patient Details  Name: Kendra Clay MRN: [id number] Date of Birth: 12-Dec-1960  Transition of Care [facility]) CM/SW Contact:  Ronny Bacon, RN Phone Number: 02/01/2023, 11:04 AM   Clinical Narrative:    Patient is being discharged today. Cindie with [facility] made aware.   Final next level of care: Home w Home Health Services Barriers to Discharge: No Barriers Identified   Patient Goals and CMS Choice            Discharge Placement                       Discharge Plan and Services Additional resources added to the After Visit Summary for                            [facility] Arranged: PT HH Agency: [facility] Date [facility] Agency Contacted: 01/31/23 Time HH Agency Contacted: 1202 Representative spoke with at [facility] Agency: Cindie  Social Drivers of Health (SDOH) Interventions SDOH Screenings   Food Insecurity: No Food Insecurity (01/29/2023)  Housing: Low Risk  (01/29/2023)  Transportation Needs: No Transportation Needs (01/29/2023)  Utilities: Not At Risk (01/29/2023)  Tobacco Use: Medium Risk (01/29/2023)     Readmission Risk Interventions    10/15/2021    4:23 PM  Readmission Risk Prevention Plan  Transportation Screening Complete  PCP or Specialist Appt within 3-5 Days Complete  HRI or Home Care Consult Complete  Social Work Consult for Recovery Care Planning/Counseling Complete  Palliative Care Screening Not Applicable  Medication Review Ocean[employer]) Complete

## 2023-02-01 NOTE — Progress Notes (Signed)
DISCHARGE NOTE HOME ANELL MEEGAN to be discharged Home per MD order. Discussed prescriptions and follow up appointments with the patient. Prescriptions given to patient; medication list explained in detail. Patient verbalized understanding.  Skin clean, dry and intact without evidence of skin break down, no evidence of skin tears noted. IV catheter discontinued intact. Site without signs and symptoms of complications. Dressing and pressure applied. Pt denies pain at the site currently. No complaints noted.  Patient free of lines, drains, and wounds.   An After Visit Summary (AVS) was printed and given to the patient. Patient escorted via wheelchair, and discharged home via private auto.  Margarita Grizzle, RN

## 2023-02-01 NOTE — Progress Notes (Signed)
[facility] Patient Discharge Orders- [facility] CLINIC: [facility]  Patient's name: DYEMOND WORMAN Admit/DC Dates: 01/29/2023 - 02/01/2023  Discharge Diagnoses: Generalized weakness/diarrhea    Hypokalemia   Use 4 k bath   Aranesp: Given: no   Date and amount of last dose: 0  Last Hgb: 9.5 PRBC's Given: no Date/# of units: no ESA dose for discharge: mircera 100 mcg IV q 2 weeks  per protocol  IV Iron dose at discharge: no  Heparin change: no  EDW Change: yes  New EDW: 55.0   Bath Change: yes use 4k  with weekly k  labs  Access intervention/Change: no Details:  Hectorol/Calcitriol change: no  Discharge Labs: Calcium8.3 Phosphorus 3.1 Albumin 2.6 K+ 3.4  IV Antibiotics: no Details:  On Coumadin?: no Last INR: Next INR: Managed By:   OTHER/APPTS/LAB ORDERS:    D/C Meds to be reconciled by nurse after every discharge.  Completed By:   Reviewed by: MD:______ RN_______

## 2023-02-01 NOTE — Evaluation (Signed)
Occupational Therapy Evaluation Patient Details Name: Kendra Clay MRN: [id number] DOB: 31-Mar-1960 Today's Date: 02/01/2023   History of Present Illness The pt is a 62 yo female presenting 12/26 for lethargy, diarrhea x1 week. PMH includes: enlarged liver, ESRD on HD MWF, HTN, polycystic kidney disease, scoliosis, ORIF L femur and L wrist 08/2021, L tibia IM nail 05/2018, and R tibia IM nail 08/2021.   Clinical Impression   PTA patient independent with ADLs, IADLs (light) and mobility using RW. Admitted for above and presents with problem list below.  She is able to complete transfers and mobility using RW with min guard, up to min guard assist for ADLS.  Fatigues easily and reviewed energy conservation techniques, fall prevention. Pt reports son can assist her as needed at dc.  Recommend continued OT acutely and after dc at [facility] level to optimize independence, safety and return to PLOF. Will follow.       If plan is discharge home, recommend the following: A little help with walking and/or transfers;A little help with bathing/dressing/bathroom;Assistance with cooking/housework;Assist for transportation;Help with stairs or ramp for entrance    Functional Status Assessment  Patient has had a recent decline in their functional status and demonstrates the ability to make significant improvements in function in a reasonable and predictable amount of time.  Equipment Recommendations  None recommended by OT    Recommendations for Other Services       Precautions / Restrictions Precautions Precautions: Fall Restrictions Weight Bearing Restrictions Per Provider Order: No      Mobility Bed Mobility Overal bed mobility: Modified Independent             General bed mobility comments: HOB slightly elevated, but no assist required; pt reports she has a hospital bed at home    Transfers Overall transfer level: Needs assistance Equipment used: Rolling walker (2 wheels) Transfers: Sit  to/from Stand Sit to Stand: Contact guard assist           General transfer comment: increased time, min guard for safety and balance      Balance Overall balance assessment: Needs assistance Sitting-balance support: No upper extremity supported, Feet supported Sitting balance-Leahy Scale: Good     Standing balance support: Bilateral upper extremity supported, During functional activity Standing balance-Leahy Scale: Poor                             ADL either performed or assessed with clinical judgement   ADL Overall ADL's : Needs assistance/impaired     Grooming: Contact guard assist;Standing           Upper Body Dressing : Set up;Sitting   Lower Body Dressing: Contact guard assist;Sit to/from stand   Toilet Transfer: Contact guard assist;Ambulation;Rolling walker (2 wheels)           Functional mobility during ADLs: Contact guard assist;Rolling walker (2 wheels)       Vision Baseline Vision/History: 1 Wears glasses Vision Assessment?: No apparent visual deficits     Perception         Praxis         Pertinent Vitals/Pain Pain Assessment Pain Assessment: No/denies pain     Extremity/Trunk Assessment Upper Extremity Assessment Upper Extremity Assessment: Overall WFL for tasks assessed   Lower Extremity Assessment Lower Extremity Assessment: Defer to PT evaluation   Cervical / Trunk Assessment Cervical / Trunk Assessment: Kyphotic   Communication Communication Communication: No apparent difficulties Cueing Techniques: Verbal  cues   Cognition Arousal: Alert Behavior During Therapy: WFL for tasks assessed/performed Overall Cognitive Status: Within Functional Limits for tasks assessed                                       General Comments  fatigues easily, education on energy conservation    Exercises     Shoulder Instructions      Home Living Family/patient expects to be discharged to:: Private  residence Living Arrangements: Children Available Help at Discharge: Family;Available 24 hours/day Type of Home: House Home Access: Ramped entrance     Home Layout: One level     Bathroom Shower/Tub: Chief Strategy Officer: Standard Bathroom Accessibility: Yes How Accessible: Accessible via walker;Accessible via wheelchair Home Equipment: Rolling Walker (2 wheels);Wheelchair - manual;Shower seat;BSC/3in1;Hospital bed   Additional Comments: reports her sons friend can drive her to HD if needed, or use transport service      Prior Functioning/Environment Prior Level of Function : Independent/Modified Independent;Driving             Mobility Comments: ambulates with RW for household distances primarily ADLs Comments: independent with ADLs, light IADLs, driving        OT Problem List: Decreased strength;Decreased activity tolerance;Impaired balance (sitting and/or standing);Decreased knowledge of precautions;Decreased knowledge of use of DME or AE      OT Treatment/Interventions: Self-care/ADL training;Therapeutic exercise;DME and/or AE instruction;Therapeutic activities;Patient/family education;Balance training;Energy conservation    OT Goals(Current goals can be found in the care plan section) Acute Rehab OT Goals Patient Stated Goal: home OT Goal Formulation: With patient Time For Goal Achievement: 02/15/23 Potential to Achieve Goals: Good  OT Frequency: Min 1X/week    Co-evaluation              AM-PAC OT "6 Clicks" Daily Activity     Outcome Measure Help from another person eating meals?: None Help from another person taking care of personal grooming?: A Little Help from another person toileting, which includes using toliet, bedpan, or urinal?: A Little Help from another person bathing (including washing, rinsing, drying)?: A Little Help from another person to put on and taking off regular upper body clothing?: A Little Help from another person  to put on and taking off regular lower body clothing?: A Little 6 Click Score: 19   End of Session Equipment Utilized During Treatment: Rolling walker (2 wheels) Nurse Communication: Mobility status  Activity Tolerance: Patient tolerated treatment well Patient left: with call bell/phone within reach;with bed alarm set;Other (comment) (sitting eob)  OT Visit Diagnosis: Unsteadiness on feet (R26.81);Muscle weakness (generalized) (M62.81)                Time: 8295-6213 OT Time Calculation (min): 17 min Charges:  OT General Charges $OT Visit: 1 Visit OT Evaluation $OT Eval Moderate Complexity: 1 Mod  Barry Brunner, OT Acute Rehabilitation Services Office [phone]   Chancy Milroy 02/01/2023, 11:42 AM

## 2023-02-01 NOTE — Plan of Care (Signed)
°  Problem: Health Behavior/Discharge Planning: Goal: Ability to manage health-related needs will improve Outcome: Adequate for Discharge   Problem: Nutritional: Goal: Ability to make healthy dietary choices will improve Outcome: Adequate for Discharge   Problem: Clinical Measurements: Goal: Complications related to the disease process, condition or treatment will be avoided or minimized Outcome: Adequate for Discharge   Problem: Education: Goal: Knowledge of General Education information will improve Description: Including pain rating scale, medication(s)/side effects and non-pharmacologic comfort measures Outcome: Adequate for Discharge   Problem: Health Behavior/Discharge Planning: Goal: Ability to manage health-related needs will improve Outcome: Adequate for Discharge   Problem: Clinical Measurements: Goal: Ability to maintain clinical measurements within normal limits will improve Outcome: Adequate for Discharge Goal: Will remain free from infection Outcome: Adequate for Discharge Goal: Diagnostic test results will improve Outcome: Adequate for Discharge Goal: Respiratory complications will improve Outcome: Adequate for Discharge Goal: Cardiovascular complication will be avoided Outcome: Adequate for Discharge   Problem: Activity: Goal: Risk for activity intolerance will decrease Outcome: Adequate for Discharge   Problem: Nutrition: Goal: Adequate nutrition will be maintained Outcome: Adequate for Discharge   Problem: Coping: Goal: Level of anxiety will decrease Outcome: Adequate for Discharge   Problem: Elimination: Goal: Will not experience complications related to bowel motility Outcome: Adequate for Discharge Goal: Will not experience complications related to urinary retention Outcome: Adequate for Discharge   Problem: Pain Management: Goal: General experience of comfort will improve Outcome: Adequate for Discharge   Problem: Safety: Goal: Ability to  remain free from injury will improve Outcome: Adequate for Discharge   Problem: Skin Integrity: Goal: Risk for impaired skin integrity will decrease Outcome: Adequate for Discharge

## 2023-02-02 NOTE — Progress Notes (Signed)
Late Note Entry- Feb 02, 2023  Pt was d/c yesterday. Contacted [facility] this morning to be advised of pt's d/c date. Clinic advised pt's last HD treatment was on 12/28 per documentation.   Olivia Canter Renal Navigator [phone]

## 2023-02-14 IMAGING — CT CT CHEST W/O CM
2 of 4 series · 15 of 36 positions shown, 18 images · non-contrast
Comparison: Chest CT dated 04/10/2020. Chest radiograph dated
04/24/2020.

CLINICAL DATA: 60-year-old female with respiratory failure.

EXAM:
CT CHEST WITHOUT CONTRAST
TECHNIQUE: Multidetector CT imaging of the chest was performed following the
standard protocol without IV contrast.

[Series 3: chest w/o 2mm st · axial · non-contrast · 0.62mm/px · z∈[+1115,+1371]mm · 12 of 152 slices shown, 15 images]
[im 12/152  mediastinal]
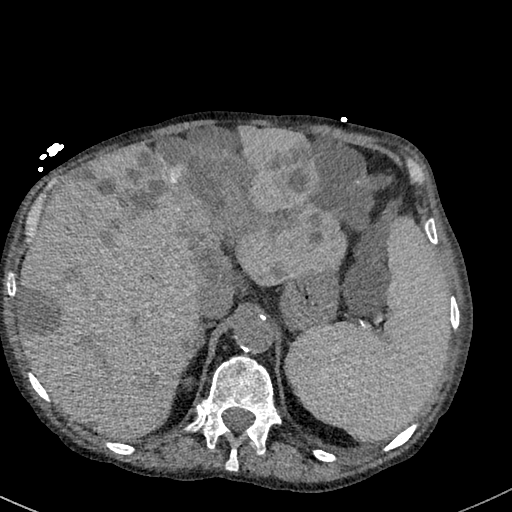
[im 12/152  lung]
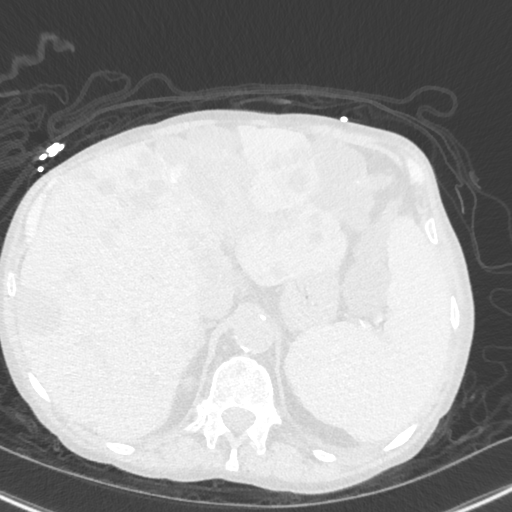
[im 24/152  lung]
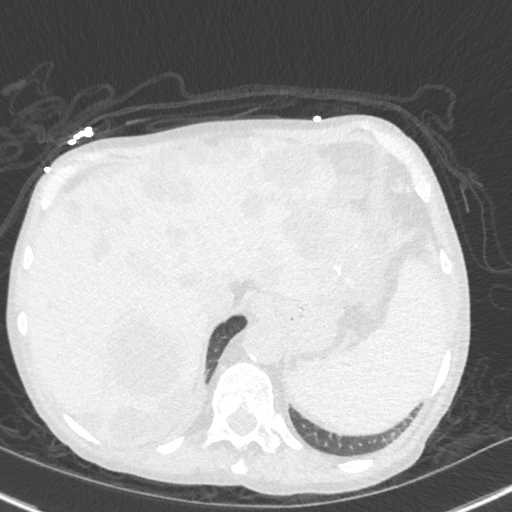
[im 35/152  lung]
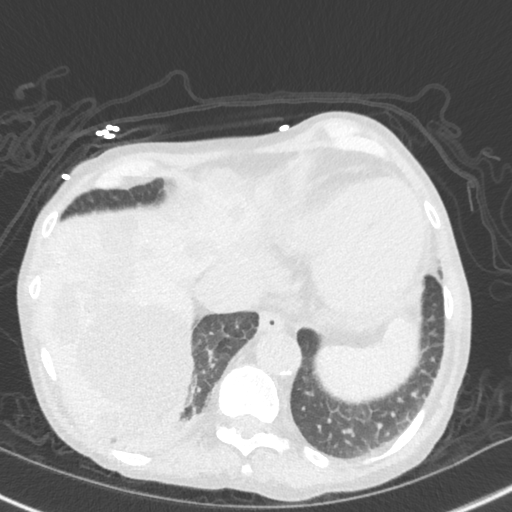
[im 47/152  lung]
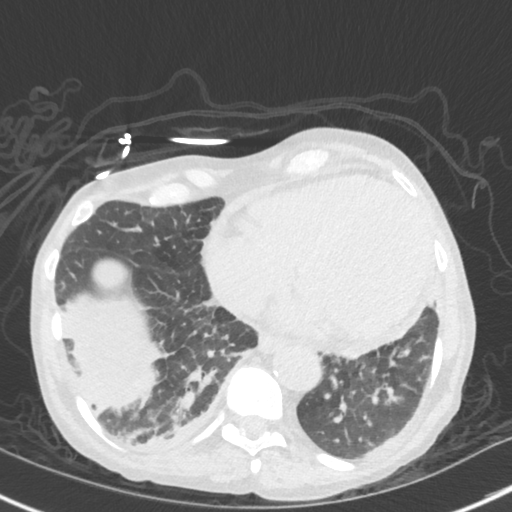
[im 59/152  mediastinal]
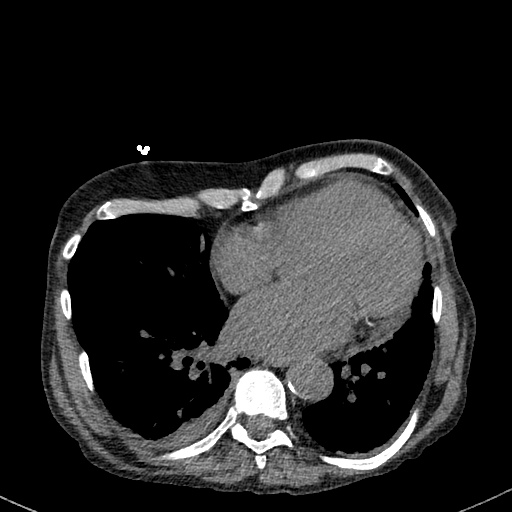
[im 59/152  lung]
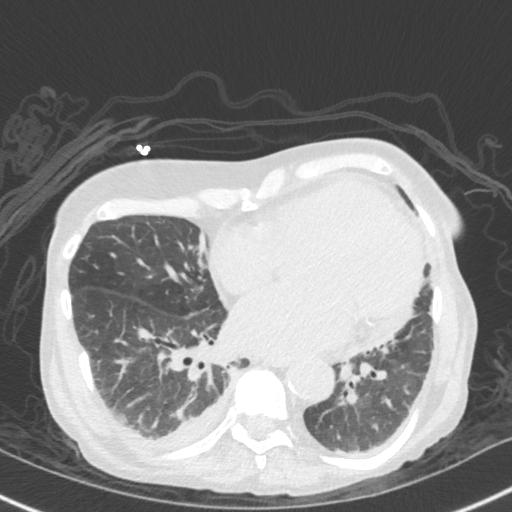
[im 70/152  lung]
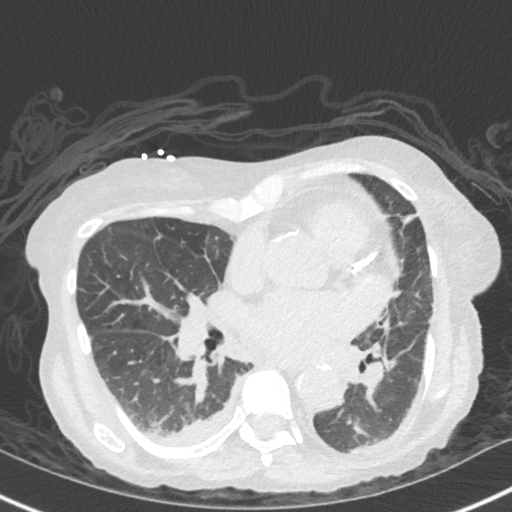
[im 82/152  lung]
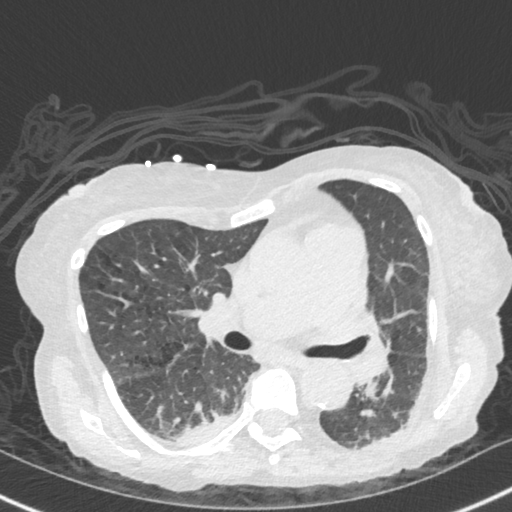
[im 93/152  lung]
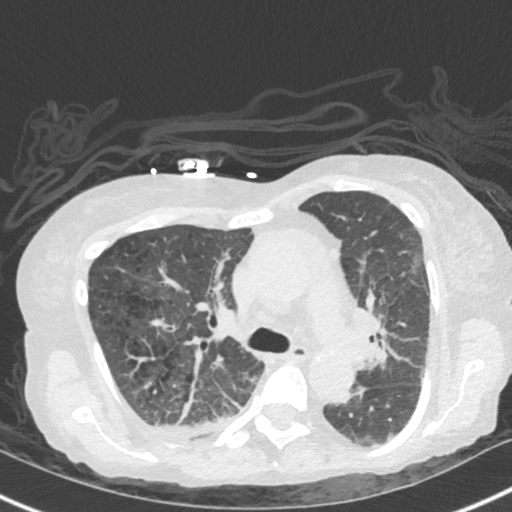
[im 105/152  mediastinal]
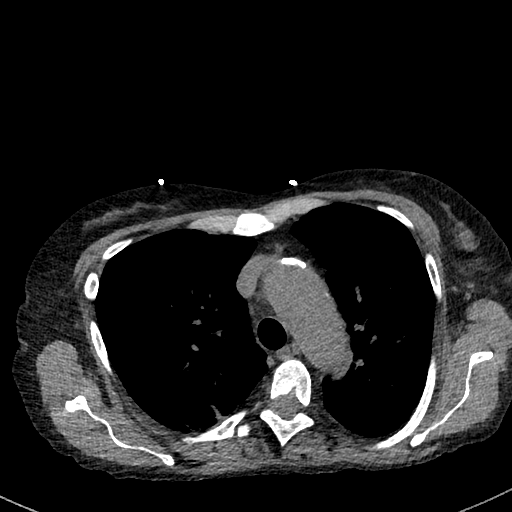
[im 105/152  lung]
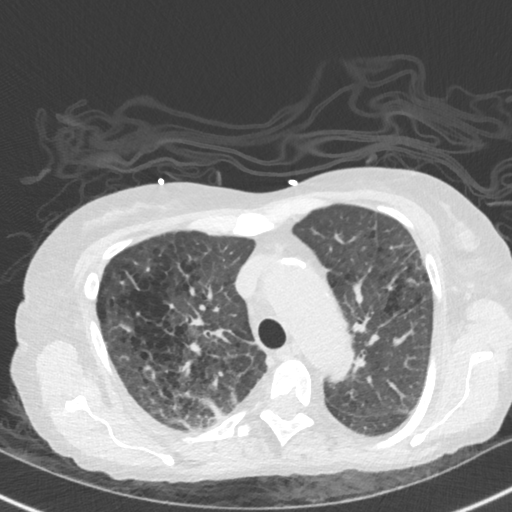
[im 117/152  lung]
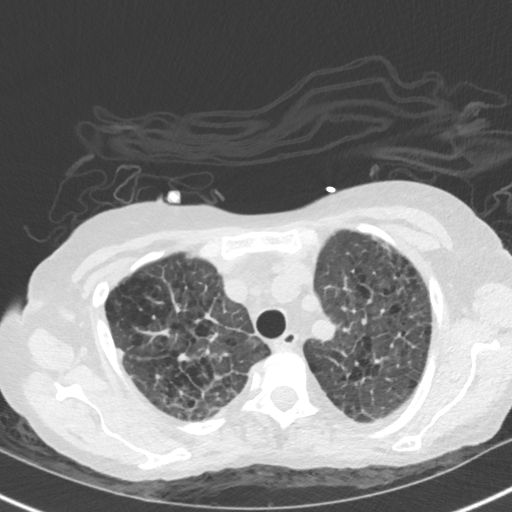
[im 128/152  lung]
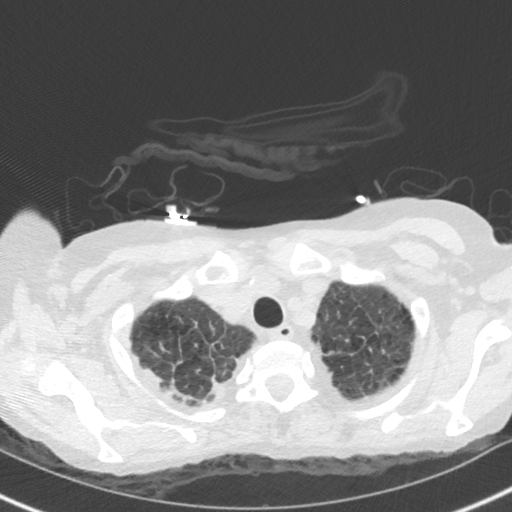
[im 140/152  lung]
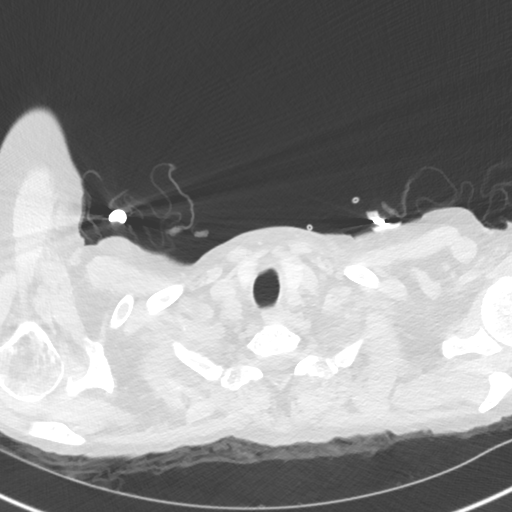

[Series 5: chest w/o 2mm st cor · coronal · non-contrast · 0.59mm/px · 3 of 151 slices shown]
[im 31/151  lung]
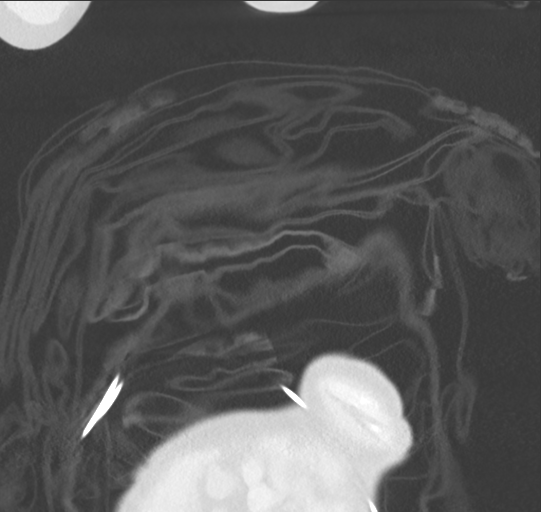
[im 61/151  lung]
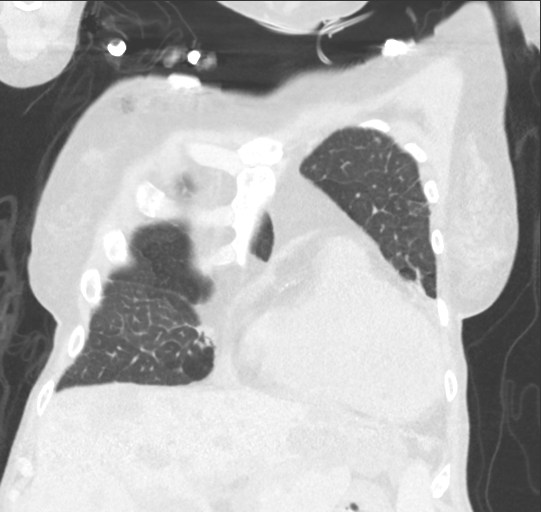
[im 91/151  lung]
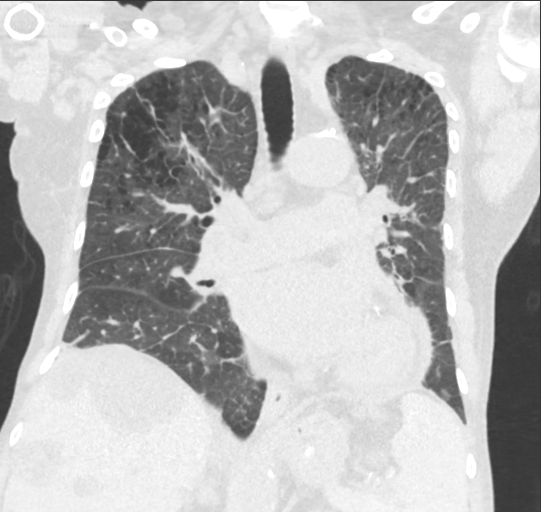

[15 of 36 positions shown; findings below may reference images not displayed]

FINDINGS: Evaluation of this exam is limited in the absence of intravenous
contrast.

Cardiovascular: Mild cardiomegaly. Small pericardial effusion
measuring 5 mm in thickness. Moderate atherosclerotic calcification
of the thoracic aorta. There is mild dilatation of the main
pulmonary trunk suggestive of pulmonary hypertension. Clinical
correlation is recommended.

Mediastinum/Nodes: No hilar or mediastinal adenopathy. Evaluation
however is limited in the absence of intravenous contrast.
Top-normal subcarinal lymph node measures 10 mm in short axis. The
esophagus and the thyroid gland are grossly unremarkable. No
mediastinal fluid collection.

Lungs/Pleura: Trace bilateral pleural effusions. There is diffuse
interstitial and interlobular septal prominence consistent with
edema. Pneumonia is not excluded. No focal consolidation or
pneumothorax. There is background of centrilobular emphysema. Right
lung base streaky atelectasis. The central airways are patent.

Upper Abdomen: Innumerable hepatic and renal cysts.

Musculoskeletal: Diffuse osseous sclerosis consistent with renal
osteodystrophy. No acute osseous pathology. Right axillary vascular
stent.
IMPRESSION: 1. Cardiomegaly with findings of CHF and trace bilateral pleural
effusions.
2. Aortic Atherosclerosis (8WRSN-BS4.4) and Emphysema (8WRSN-QEI.6).

## 2023-04-02 ENCOUNTER — Emergency Department (HOSPITAL_COMMUNITY)
Admission: EM | Admit: 2023-04-02 | Discharge: 2023-04-03 | Disposition: A | Discharge status: Home / Self Care | Payer: Medicare Other | Attending: Student | Admitting: Student

## 2023-04-02 ENCOUNTER — Emergency Department (HOSPITAL_COMMUNITY): Payer: Medicare Other

## 2023-04-02 ENCOUNTER — Encounter (HOSPITAL_COMMUNITY): Payer: Self-pay

## 2023-04-02 ENCOUNTER — Other Ambulatory Visit: Payer: Self-pay

## 2023-04-02 DIAGNOSIS — Z87891 Personal history of nicotine dependence: Secondary | ICD-10-CM | POA: Diagnosis not present

## 2023-04-02 DIAGNOSIS — Z992 Dependence on renal dialysis: Secondary | ICD-10-CM | POA: Insufficient documentation

## 2023-04-02 DIAGNOSIS — R0602 Shortness of breath: Secondary | ICD-10-CM | POA: Insufficient documentation

## 2023-04-02 DIAGNOSIS — I509 Heart failure, unspecified: Secondary | ICD-10-CM | POA: Diagnosis not present

## 2023-04-02 DIAGNOSIS — I132 Hypertensive heart and chronic kidney disease with heart failure and with stage 5 chronic kidney disease, or end stage renal disease: Secondary | ICD-10-CM | POA: Insufficient documentation

## 2023-04-02 DIAGNOSIS — R5383 Other fatigue: Secondary | ICD-10-CM

## 2023-04-02 DIAGNOSIS — N186 End stage renal disease: Secondary | ICD-10-CM | POA: Insufficient documentation

## 2023-04-02 DIAGNOSIS — J449 Chronic obstructive pulmonary disease, unspecified: Secondary | ICD-10-CM | POA: Insufficient documentation

## 2023-04-02 LAB — CBC
HCT: 31.9 % — ABNORMAL LOW (ref 36.0–46.0)
Hemoglobin: 10 g/dL — ABNORMAL LOW (ref 12.0–15.0)
MCH: 29.9 pg (ref 26.0–34.0)
MCHC: 31.3 g/dL (ref 30.0–36.0)
MCV: 95.5 fL (ref 80.0–100.0)
Platelets: 108 10*3/uL — ABNORMAL LOW (ref 150–400)
RBC: 3.34 MIL/uL — ABNORMAL LOW (ref 3.87–5.11)
RDW: 16.7 % — ABNORMAL HIGH (ref 11.5–15.5)
WBC: 5.2 10*3/uL (ref 4.0–10.5)
nRBC: 0 % (ref 0.0–0.2)

## 2023-04-02 LAB — I-STAT CHEM 8, ED
BUN: 10 mg/dL (ref 8–23)
Calcium, Ion: 1.13 mmol/L — ABNORMAL LOW (ref 1.15–1.40)
Chloride: 94 mmol/L — ABNORMAL LOW (ref 98–111)
Creatinine, Ser: 6.2 mg/dL — ABNORMAL HIGH (ref 0.44–1.00)
Glucose, Bld: 95 mg/dL (ref 70–99)
HCT: 31 % — ABNORMAL LOW (ref 36.0–46.0)
Hemoglobin: 10.5 g/dL — ABNORMAL LOW (ref 12.0–15.0)
Potassium: 3.5 mmol/L (ref 3.5–5.1)
Sodium: 136 mmol/L (ref 135–145)
TCO2: 30 mmol/L (ref 22–32)

## 2023-04-02 LAB — LIPASE, BLOOD: Lipase: 26 U/L (ref 11–51)

## 2023-04-02 LAB — COMPREHENSIVE METABOLIC PANEL
ALT: 7 U/L (ref 0–44)
AST: 11 U/L — ABNORMAL LOW (ref 15–41)
Albumin: 3 g/dL — ABNORMAL LOW (ref 3.5–5.0)
Alkaline Phosphatase: 82 U/L (ref 38–126)
Anion gap: 15 (ref 5–15)
BUN: 9 mg/dL (ref 8–23)
CO2: 27 mmol/L (ref 22–32)
Calcium: 9.7 mg/dL (ref 8.9–10.3)
Chloride: 93 mmol/L — ABNORMAL LOW (ref 98–111)
Creatinine, Ser: 5.62 mg/dL — ABNORMAL HIGH (ref 0.44–1.00)
GFR, Estimated: 8 mL/min — ABNORMAL LOW (ref >60)
Glucose, Bld: 95 mg/dL (ref 70–99)
Potassium: 3.4 mmol/L — ABNORMAL LOW (ref 3.5–5.1)
Sodium: 135 mmol/L (ref 135–145)
Total Bilirubin: 1.6 mg/dL — ABNORMAL HIGH (ref 0.0–1.2)
Total Protein: 7.2 g/dL (ref 6.5–8.1)

## 2023-04-02 LAB — RESP PANEL BY RT-PCR (RSV, FLU A&B, COVID)  RVPGX2
Influenza A by PCR: NEGATIVE
Influenza B by PCR: NEGATIVE
Resp Syncytial Virus by PCR: NEGATIVE
SARS Coronavirus 2 by RT PCR: NEGATIVE

## 2023-04-02 LAB — TROPONIN I (HIGH SENSITIVITY)
Troponin I (High Sensitivity): 20 ng/L — ABNORMAL HIGH (ref <18)
Troponin I (High Sensitivity): 19 ng/L — ABNORMAL HIGH (ref <18)

## 2023-04-02 LAB — MAGNESIUM: Magnesium: 1.8 mg/dL (ref 1.7–2.4)

## 2023-04-02 MED ORDER — IOHEXOL 350 MG/ML SOLN
75.0000 mL | Freq: Once | INTRAVENOUS | Status: AC | PRN
  Administered 2023-04-02: 75 mL via INTRAVENOUS

## 2023-04-02 MED ORDER — ALBUTEROL SULFATE HFA 108 (90 BASE) MCG/ACT IN AERS: 2.0000 | INHALATION_SPRAY | RESPIRATORY_TRACT | Status: DC | PRN

## 2023-04-02 NOTE — ED Triage Notes (Signed)
 Pt arrived via ems with complaints of weakness and sob over one week. Reports decreased appetite.  Pt has COPD and takes HD every MWF. Also reported abdominal pain

## 2023-04-02 NOTE — ED Provider Triage Note (Signed)
 Emergency Medicine Provider Triage Evaluation Note  Kendra Clay , a 63 y.o. female  was evaluated in triage.  Pt complains of fatigue for 1 week. Some nausea and 1 episode of diarrhea. Has felt short of breath. Has not missed any dialysis sessions.  Review of Systems  Positive: Cough, fatigue Negative: fever  Physical Exam  BP (!) 143/84 (BP Location: Left Arm)   Pulse 79   Temp 98.1 F (36.7 C) (Oral)   Resp 20   Wt 57 kg   SpO2 100%   BMI 22.26 kg/m  Gen:   Awake, no distress  Resp:  Normal effort, no wheezing Other:  Upper abdominal tenderness, chronic per patient  Medical Decision Making  Medically screening exam initiated at 12:44 PM.  Appropriate orders placed.  Kendra Clay was informed that the remainder of the evaluation will be completed by another provider, this initial triage assessment does not replace that evaluation, and the importance of remaining in the ED until their evaluation is complete.  Labs, EKG, CXR ordered. VSS.   Pricilla Loveless, MD 04/02/23 1245

## 2023-04-02 NOTE — ED Notes (Signed)
 Fall risk and restricted limb wrist bands on patient

## 2023-04-02 NOTE — ED Notes (Signed)
 Patient transported to CT

## 2023-04-02 NOTE — Discharge Instructions (Signed)
 Kendra Clay:  Thank you for allowing [place] to take care of you today.  We hope you begin feeling better soon.  To-Do: Please follow-up with your primary doctor to discuss any findings from today's emergency department visit. Please return to the Emergency Department or call 911 if you experience chest pain, shortness of breath, severe pain, severe fever, altered mental status, or have any reason to think that you need emergency medical care.  Thank you again.  Hope you feel better soon.  Department of Emergency Medicine [facility]

## 2023-04-02 NOTE — ED Provider Notes (Signed)
 [place] EMERGENCY DEPARTMENT AT [facility] Provider Note  Arrival date/time:04/02/2023 11:23 PM  HPI/ROS   Kendra Clay is a 63 y.o. female with PMH significant for ESRD on HD MWF, HFrEF, COPD, HTN, who presents for SOB and fatigue History is provided by patient.  Patient endorses 1 week of increased fatigue and feeling short of breath on exertion.  She denies any associated chest pain. She denies any diarrhea.  Has had some emesis, but mostly has been acid reflux in nature. She has had a slight cough, but no fevers or chills or other URI symptoms.  Denies abdominal pain. Endorses that she has not missed any recent hemodialysis.  Her last session was yesterday.  She does endorse that she recently had changed her medications about a week ago before symptoms started.  Her Coreg was changed to metoprolol.  A complete ROS was performed with pertinent positives/negatives noted above.   ED Course and Medical Decision Making   I personally reviewed the patient's vitals.  Assessment/Plan: This is a 63 year old patient with history above who is presenting for shortness of breath and fatigue.  No recent missed dialysis sessions. On physical exam, she does have some tenderness to palpation in her left upper quadrant.  She endorses that she does have history of polycystic liver but is not typically tender in this area.  Her lung sounds are clear to auscultation bilaterally.  Workup: Troponins are flat x 2, initial troponin 20 and second 19. Her CMP shows very mild hypokalemia to 3.4, which appears to be chronically low.  Will defer repletion at this time due to patient being on dialysis and not wanting to cause hyperkalemia.  She has no additional concerning electrolyte derangements.  Her creatinine is 5.62 in the setting of ESRD.  Her total bilirubin is elevated to 1.6, which is new.  Her magnesium is within normal limits. CBC shows no leukocytosis.  Her hemoglobin is 10.0 which  is baseline for her. Her lipase is within normal limits. Respiratory panel is negative for COVID, flu and RSV. Her chest x-ray does not show any focal consolidation that would be concerning for pneumonia.  No pneumothorax or obvious pleural effusion.  Radiologist interpretation of CT chest and CT abdomen pelvis. 1. No evidence of pulmonary embolus.  2. Cardiomegaly, with interlobular septal thickening and scattered  ground-glass opacities consistent with congestive heart failure and  mild edema.  3. Aortic Atherosclerosis (ICD10-I70.0) and Emphysema (ICD10-J43.9).  4. Stable sequela of polycystic kidney disease, with numerous renal  and hepatic cysts as above.  5. Stable splenic cyst or hemangioma.   I agree with this interpretation. Given patient's reassuring workup, I have low suspicion for emergent cause of her fatigue at this time. On reassessment, patient is resting comfortably in bed.  She was given food and drink and is tolerating p.o.  I stood patient up and ambulated a few steps with her holding onto my hands. She does typically walk with a walker.  I discussed plan for discharge home and patient endorsed agreement.  Disposition:  I discussed the plan for discharge with the patient and/or their surrogate at bedside prior to discharge and they were in agreement with the plan and verbalized understanding of the return precautions provided. All questions answered to the best of my ability. Ultimately, the patient was discharged in stable condition with stable vital signs. I am reassured that they are capable of close follow up and good social support at home.   Clinical Impression:  1. Other fatigue     Rx / DC Orders ED Discharge Orders     None       The plan for this patient was discussed with Dr. Posey Rea, who voiced agreement and who oversaw evaluation and treatment of this patient.   Clinical Complexity A medically appropriate history, review of systems, and  physical exam was performed.  Patient's presentation is most consistent with acute presentation with potential threat to life or bodily function.  Medical Decision Making Amount and/or Complexity of Data Reviewed Labs: ordered. Radiology: ordered.  Risk Prescription drug management.    Physical Exam and Medical History   Vitals:   04/02/23 2130 04/02/23 2215 04/02/23 2230 04/02/23 2244  BP: (!) 168/89 (!) 165/87 (!) 162/83   Pulse: 85 80 80   Resp: 16 20 20    Temp:    97.6 F (36.4 C)  TempSrc:    Oral  SpO2: 94% 97% 95%   Weight:      Height:        Physical Exam Vitals and nursing note reviewed.  Constitutional:      Appearance: She is well-developed.  HENT:     Head: Normocephalic and atraumatic.  Eyes:     Conjunctiva/sclera: Conjunctivae normal.  Cardiovascular:     Rate and Rhythm: Normal rate and regular rhythm.     Heart sounds: No murmur heard. Pulmonary:     Effort: Pulmonary effort is normal. No respiratory distress.     Breath sounds: Normal breath sounds.  Abdominal:     Palpations: Abdomen is soft.     Tenderness: There is abdominal tenderness (Left upper quadrant.).  Musculoskeletal:        General: No swelling.     Cervical back: Neck supple.     Right lower leg: No edema.     Left lower leg: No edema.     Comments: AV fistula to the right forearm, palpable thrill  Skin:    General: Skin is warm and dry.     Capillary Refill: Capillary refill takes less than 2 seconds.  Neurological:     Mental Status: She is alert.  Psychiatric:        Mood and Affect: Mood normal.     Medical History: Allergies  Allergen Reactions   E-Mycin [Erythromycin] Hives   Sulfa Antibiotics Rash    Skin Rash     Lisinopril Cough   Past Medical History:  Diagnosis Date   Complication of anesthesia    Enlarged liver    secondary to PKD   ESRD on hemodialysis (HCC)    MWF [place]   Heart murmur    "slight" per ? Dr Arlyce Dice 30 years ago. 2D ECHO  30  yearsa go.   History of blood transfusion    C- Section   History of bronchitis    numerous, last time> 1 year   Hypertension    Night muscle spasms    legs   Polycystic kidney disease    Genetic dx 23 years ago   PONV (postoperative nausea and vomiting)    patch helped   Scoliosis    Strabismus     Past Surgical History:  Procedure Laterality Date   A/V FISTULAGRAM Right 07/10/2022   Procedure: A/V Fistulagram;  Surgeon: Cephus Shelling, MD;  Location: [facility];  Service: Cardiovascular;  Laterality: Right;   ABDOMINAL HYSTERECTOMY     AV FISTULA PLACEMENT  09/01/2011   Procedure: ARTERIOVENOUS (AV) FISTULA CREATION;  Surgeon: Larina Earthly, MD;  Location: [facility] OR;  Service: Vascular;  Laterality: Right;   CESAREAN SECTION  1997   EYE SURGERY     for lazy eye   ORIF FEMUR FRACTURE Left 08/29/2021   Procedure: OPEN REDUCTION INTERNAL FIXATION (ORIF) DISTAL FEMUR FRACTURE;  Surgeon: Roby Lofts, MD;  Location: [facility] OR;  Service: Orthopedics;  Laterality: Left;   ORIF WRIST FRACTURE Left 08/29/2021   Procedure: OPEN REDUCTION INTERNAL FIXATION (ORIF) WRIST FRACTURE;  Surgeon: Roby Lofts, MD;  Location: [facility] OR;  Service: Orthopedics;  Laterality: Left;   PERIPHERAL VASCULAR BALLOON ANGIOPLASTY  07/10/2022   Procedure: PERIPHERAL VASCULAR BALLOON ANGIOPLASTY;  Surgeon: Cephus Shelling, MD;  Location: [facility];  Service: Cardiovascular;;   TIBIA IM NAIL INSERTION Left 05/19/2018   Procedure: INTRAMEDULLARY (IM) NAIL TIBIAL;  Surgeon: Bjorn Pippin, MD;  Location: [facility] OR;  Service: Orthopedics;  Laterality: Left;   TIBIA IM NAIL INSERTION Right 08/29/2021   Procedure: INTRAMEDULLARY (IM) NAIL TIBIAL WITH IRRIGATION AND DEBRIDMENT;  Surgeon: Roby Lofts, MD;  Location: [facility] OR;  Service: Orthopedics;  Laterality: Right;   Family History  Problem Relation Age of Onset   Arthritis Mother    Kidney disease Father    Polycystic kidney disease Son    Asthma Son     Hypertension Maternal Grandmother     Social History   Tobacco Use   Smoking status: Former    Current packs/day: 0.00    Average packs/day: 1.5 packs/day for 34.9 years (52.4 ttl pk-yrs)    Types: Cigarettes    Start date: 22    Quit date: 01/10/2015    Years since quitting: 8.2   Smokeless tobacco: Former    Quit date: 01/09/2014  Vaping Use   Vaping status: Never Used  Substance Use Topics   Alcohol use: No    Alcohol/week: 0.0 standard drinks of alcohol   Drug use: No    Procedures   If procedures were preformed on this patient, they are listed below:  Procedures   -------- HPI and MDM generated using voice dictation software and may contain dictation errors. Please contact me for any clarification or with any questions.   Cephus Slater, MD Emergency Medicine PGY-2    Caron Presume, MD 04/02/23 Janetta Hora    Glendora Score, MD 04/03/23 [phone]

## 2023-04-03 NOTE — ED Notes (Signed)
 Pt states she has her brother otw for a ride. Patient verbalizes understanding of discharge instructions. Opportunity for questioning and answers were provided. Armband removed by staff, pt discharged from ED. Wheeled out to lobby

## 2023-04-30 ENCOUNTER — Ambulatory Visit (HOSPITAL_COMMUNITY)
Admission: RE | Admit: 2023-04-30 | Discharge: 2023-04-30 | Disposition: A | Discharge status: Home / Self Care | Attending: Nephrology | Admitting: Nephrology

## 2023-04-30 ENCOUNTER — Other Ambulatory Visit: Payer: Self-pay

## 2023-04-30 ENCOUNTER — Encounter (HOSPITAL_COMMUNITY): Payer: Self-pay | Admitting: Nephrology

## 2023-04-30 ENCOUNTER — Encounter (HOSPITAL_COMMUNITY)
Admission: RE | Disposition: A | Discharge status: Home / Self Care | Payer: Self-pay | Source: Home / Self Care | Attending: Nephrology

## 2023-04-30 DIAGNOSIS — Z79899 Other long term (current) drug therapy: Secondary | ICD-10-CM | POA: Diagnosis not present

## 2023-04-30 DIAGNOSIS — Y832 Surgical operation with anastomosis, bypass or graft as the cause of abnormal reaction of the patient, or of later complication, without mention of misadventure at the time of the procedure: Secondary | ICD-10-CM | POA: Insufficient documentation

## 2023-04-30 DIAGNOSIS — Z87891 Personal history of nicotine dependence: Secondary | ICD-10-CM | POA: Insufficient documentation

## 2023-04-30 DIAGNOSIS — I120 Hypertensive chronic kidney disease with stage 5 chronic kidney disease or end stage renal disease: Secondary | ICD-10-CM | POA: Insufficient documentation

## 2023-04-30 DIAGNOSIS — T82858A Stenosis of vascular prosthetic devices, implants and grafts, initial encounter: Secondary | ICD-10-CM | POA: Insufficient documentation

## 2023-04-30 DIAGNOSIS — N186 End stage renal disease: Secondary | ICD-10-CM | POA: Diagnosis not present

## 2023-04-30 DIAGNOSIS — Z992 Dependence on renal dialysis: Secondary | ICD-10-CM | POA: Insufficient documentation

## 2023-04-30 DIAGNOSIS — D631 Anemia in chronic kidney disease: Secondary | ICD-10-CM | POA: Diagnosis not present

## 2023-04-30 DIAGNOSIS — N250 Renal osteodystrophy: Secondary | ICD-10-CM | POA: Diagnosis not present

## 2023-04-30 HISTORY — PX: A/V SHUNT INTERVENTION: CATH118220

## 2023-04-30 HISTORY — PX: VENOUS ANGIOPLASTY: CATH118376

## 2023-04-30 SURGERY — A/V SHUNT INTERVENTION
Anesthesia: LOCAL | Laterality: Right

## 2023-04-30 MED ORDER — FENTANYL CITRATE (PF) 100 MCG/2ML IJ SOLN
INTRAMUSCULAR | Status: DC | PRN
  Administered 2023-04-30: 25 ug via INTRAVENOUS

## 2023-04-30 MED ORDER — LIDOCAINE HCL (PF) 1 % IJ SOLN
INTRAMUSCULAR | Status: DC | PRN
  Administered 2023-04-30: 2 mL via INTRADERMAL

## 2023-04-30 MED ORDER — HEPARIN (PORCINE) IN NACL 1000-0.9 UT/500ML-% IV SOLN
INTRAVENOUS | Status: DC | PRN
  Administered 2023-04-30: 500 mL

## 2023-04-30 MED ORDER — FENTANYL CITRATE (PF) 100 MCG/2ML IJ SOLN
INTRAMUSCULAR | Status: AC
  Filled 2023-04-30: qty 2

## 2023-04-30 MED ORDER — IODIXANOL 320 MG/ML IV SOLN
INTRAVENOUS | Status: DC | PRN
Start: 2023-04-30 — End: 2023-04-30
  Administered 2023-04-30: 10 mL via INTRAVENOUS

## 2023-04-30 MED ORDER — MIDAZOLAM HCL 2 MG/2ML IJ SOLN
INTRAMUSCULAR | Status: DC | PRN
  Administered 2023-04-30: 1 mg via INTRAVENOUS

## 2023-04-30 MED ORDER — MIDAZOLAM HCL 2 MG/2ML IJ SOLN
INTRAMUSCULAR | Status: AC
  Filled 2023-04-30: qty 2

## 2023-04-30 MED ORDER — LIDOCAINE HCL (PF) 1 % IJ SOLN
INTRAMUSCULAR | Status: AC
  Filled 2023-04-30: qty 30

## 2023-04-30 SURGICAL SUPPLY — 10 items
BAG SNAP BAND KOVER 36X36 (MISCELLANEOUS) ×2 IMPLANT
BALLN MUSTANG 8.0X40 75 (BALLOONS) ×2 IMPLANT
BALLOON MUSTANG 8.0X40 75 (BALLOONS) IMPLANT
CATH SLIP KMP 65CM 5FR (CATHETERS) IMPLANT
COVER DOME SNAP 22 D (MISCELLANEOUS) ×2 IMPLANT
SHEATH PINNACLE R/O II 5F 6CM (SHEATH) IMPLANT
SHEATH PINNACLE R/O II 6F 4CM (SHEATH) IMPLANT
SYR MEDALLION 10ML (SYRINGE) IMPLANT
TRAY PV CATH (CUSTOM PROCEDURE TRAY) ×2 IMPLANT
WIRE BENTSON .035X145CM (WIRE) IMPLANT

## 2023-04-30 NOTE — Op Note (Signed)
 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 On examination, the radial cephalic fistula is mildly pulsatile worse the outflow with hypopigmented areas in an aneurysmal body.  Summary:  1)      The patient had successful angioplasty (8x4 Athletis FE ~15 atm) of significant stenosis in the cephalic vein arch both ends of the stent graft.  2)      The reasonable body of the cephalic vein fistula was patent with good flows; only cephalic vein drainage in the upper arm. 3)      The inflow and centrals were widely patent. 4)      This right RCF remains amenable to future percutaneous intervention.  Description of procedure: The arm was prepped and draped in the usual sterile fashion. The right upper arm brachial cephalic fistula was cannulated (40981) with an 18G Angiocath needle at the flow limb and later a 18-gauge needle in the upper arm fistula exchanged over guidewire for a 6 French sheath directed in an antegrade direction.  Contrast [id number]) injection via the side port of the sheath was performed. The angiogram of the fistula (82956) showed an aneurysmal body of the right  RCF, patent upper arm outflow cephalic vein and a 70% cephalic vein arch stenosis both ends of the stent. The inflow anastomosis and right centrals were patent.  The wire was advanced centrally without any difficulty. An 8x4 Athletis angioplasty balloon was inserted over a glide wire and positioned at the cephalic vein arch stenosis and to the stent. Venous angioplasty (21308) was carried out to 16-18 ATM with FULL effacement of the waist on the balloon at the ends of the stent arch lesion.  Repeat angiogram showed 10% residual at the site of angioplasty with no evidence of extravasation.  The flow of contrast was quicker and the fistula was markedly less pulsatile.  Hemostasis: A 3-0 ethilon purse string suture was placed at the cannulation site on removal of the  sheath.  Sedation: 1 mg Versed, 25 mcg Fentanyl. Sedation time. 13 minutes  Contrast. 10 mL  Monitoring: Because of the patient's comorbid conditions and sedation during the procedure, continuous EKG monitoring and O2 saturation monitoring was performed throughout the procedure by the RN. There were no abnormal arrhythmias encountered.  Complications: None.   Diagnoses: I87.1 Stricture of vein  N18.6 ESRD T82.858A Stricture of access  Procedure Coding:  [phone] Cannulation and angiogram of fistula, venous angioplasty (cephalic vein arch) O9629 Contrast  Recommendations:  1. Continue to cannulate the fistula with 15G needles.  2. Refer back for problems with flows. 3. Remove the suture next treatment.   Discharge: The patient was discharged home in stable condition. The patient was given education regarding the care of the dialysis access AVF and specific instructions in case of any problems.

## 2023-04-30 NOTE — Discharge Instructions (Signed)

## 2023-04-30 NOTE — H&P (Addendum)
 Chief Complaint: Decreased flows  Interval H&P  The patient has presented today for an angiogram/ angioplasty.  Various methods of treatment have been discussed with the patient.  After consideration of risk, benefits and other options for treatment, the patient has consented to a angiogram/ angioplasty with  possible stent placement.   Risks of angiogram with potential angioplasty and stenting if needed.contrast reaction, extravasation/ bleeding, dissection, hypotension and death were explained to the patient.  The patient's history has been reviewed and the patient has been examined, no changes in status.  Stable for angiogram/angioplasty  I have reviewed the patient's chart and labs.  Questions were answered to the patient's satisfaction.  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 Fistula is ~62 years old.. Renal osteodystrophy - continue binders per home regimen. Anemia - managed with ESA's and IV iron at dialysis center. HTN - resume home regimen.   HPI: Kendra Clay is an 63 y.o. female with a h/o PCKD, hypertension, multiple fractures with falls, ESRD dialyzing Monday Wednesdays and Fridays and Aspro.  Patient is being referred for enlarging aneurysms as well as hypopigmented areas.  ROS Per HPI.  Chemistry and CBC: Creat  Date/Time Value Ref Range Status  06/25/2011 05:16 PM 3.30 (H) 0.50 - 1.10 mg/dL Final   Creatinine, Ser  Date/Time Value Ref Range Status  04/02/2023 01:21 PM 6.20 (H) 0.44 - 1.00 mg/dL Final  96/05/5407 81:19 PM 5.62 (H) 0.44 - 1.00 mg/dL Final  14/78/2956 21:30 PM 8.53 (H) 0.44 - 1.00 mg/dL Final  86/57/8469 62:95 AM 13.73 (H) 0.44 - 1.00 mg/dL Final  28/41/3244 01:02 PM 12.40 (H) 0.44 - 1.00 mg/dL Final  72/53/6644 03:47 PM 4.02 (H) 0.44 - 1.00 mg/dL Final  42/59/5638 75:64 AM  6.70 (H) 0.44 - 1.00 mg/dL Final  33/29/5188 41:66 AM 3.85 (H) 0.44 - 1.00 mg/dL Final  08/03/1599 09:32 AM 3.85 (H) 0.44 - 1.00 mg/dL Final  35/57/3220 25:42 AM 5.63 (H) 0.44 - 1.00 mg/dL Final  70/62/3762 83:15 PM 5.31 (H) 0.44 - 1.00 mg/dL Final  17/61/6073 71:06 PM 7.39 (H) 0.44 - 1.00 mg/dL Final  26/94/8546 27:03 PM 3.20 (H) 0.44 - 1.00 mg/dL Final    Comment:    DELTA CHECK NOTED DIALYSIS   09/18/2021 08:07 PM 7.37 (H) 0.44 - 1.00 mg/dL Final  50/10/3816 29:93 PM 8.37 (H) 0.44 - 1.00 mg/dL Final  71/69/6789 38:10 PM 6.61 (H) 0.44 - 1.00 mg/dL Final  17/51/0258 52:77 PM 5.50 (H) 0.44 - 1.00 mg/dL Final    Comment:    DELTA CHECK NOTED  09/11/2021 04:30 PM 8.73 (H) 0.44 - 1.00 mg/dL Final  82/42/3536 14:43 AM 8.64 (H) 0.44 - 1.00 mg/dL Final  15/40/0867 61:95 PM 3.89 (H) 0.44 - 1.00 mg/dL Final    Comment:    DELTA CHECK NOTED  09/06/2021 08:50 AM 6.74 (H) 0.44 - 1.00 mg/dL Final  09/32/6712 45:80 PM 7.89 (H) 0.44 - 1.00 mg/dL Final  99/83/3825 05:39 AM 5.63 (H) 0.44 - 1.00 mg/dL Final  76/73/4193 79:02 AM 8.30 (H) 0.44 - 1.00 mg/dL Final  40/97/3532 99:24 AM 7.11 (H) 0.44 - 1.00 mg/dL Final  26/83/4196 22:29 AM 4.87 (H) 0.44 - 1.00 mg/dL Final  79/89/2119 41:74 AM 7.48 (H) 0.44 - 1.00 mg/dL Final  09/16/4816 56:31 PM 6.59 (H) 0.44 - 1.00 mg/dL Final  49/70/2637 85:88 AM 10.50 (H)  0.44 - 1.00 mg/dL Final  16/11/9602 54:09 AM 7.88 (H) 0.44 - 1.00 mg/dL Final  81/19/1478 29:56 PM 6.93 (H) 0.44 - 1.00 mg/dL Final  21/30/8657 84:69 AM 6.14 (H) 0.44 - 1.00 mg/dL Final  62/95/2841 32:44 AM 10.94 (H) 0.44 - 1.00 mg/dL Final  02/05/7251 66:44 PM 10.44 (H) 0.44 - 1.00 mg/dL Final  03/47/4259 56:38 PM 10.17 (H) 0.44 - 1.00 mg/dL Final  75/64/3329 51:88 AM 9.48 (H) 0.44 - 1.00 mg/dL Final  41/66/0630 16:01 AM 5.36 (H) 0.44 - 1.00 mg/dL Final    Comment:    DELTA CHECK NOTED  08/15/2015 09:00 AM 8.26 (H) 0.44 - 1.00 mg/dL Final  09/32/3557 32:20 AM 7.88 (H) 0.44 - 1.00 mg/dL Final   25/42/7062 37:62 AM 6.17 (H) 0.44 - 1.00 mg/dL Final  83/15/1761 60:73 PM 4.94 (H) 0.44 - 1.00 mg/dL Final   No results for input(s): "NA", "K", "CL", "CO2", "GLUCOSE", "BUN", "CREATININE", "CALCIUM", "PHOS" in the last 168 hours.  Invalid input(s): "ALB" No results for input(s): "WBC", "NEUTROABS", "HGB", "HCT", "MCV", "PLT" in the last 168 hours. Liver Function Tests: No results for input(s): "AST", "ALT", "ALKPHOS", "BILITOT", "PROT", "ALBUMIN" in the last 168 hours. No results for input(s): "LIPASE", "AMYLASE" in the last 168 hours. No results for input(s): "AMMONIA" in the last 168 hours. Cardiac Enzymes: No results for input(s): "CKTOTAL", "CKMB", "CKMBINDEX", "TROPONINI" in the last 168 hours. Iron Studies: No results for input(s): "IRON", "TIBC", "TRANSFERRIN", "FERRITIN" in the last 72 hours. PT/INR: @LABRCNTIP (inr:5)  Xrays/Other Studies: )No results found for this or any previous visit (from the past 48 hours). No results found.  PMH:   Past Medical History:  Diagnosis Date   Complication of anesthesia    Enlarged liver    secondary to PKD   ESRD on hemodialysis (HCC)    MWF [place]   Heart murmur    "slight" per ? Dr Arlyce Dice 30 years ago. 2D ECHO  30 yearsa go.   History of blood transfusion    C- Section   History of bronchitis    numerous, last time> 1 year   Hypertension    Night muscle spasms    legs   Polycystic kidney disease    Genetic dx 23 years ago   PONV (postoperative nausea and vomiting)    patch helped   Scoliosis    Strabismus     PSH:   Past Surgical History:  Procedure Laterality Date   A/V FISTULAGRAM Right 07/10/2022   Procedure: A/V Fistulagram;  Surgeon: Cephus Shelling, MD;  Location: [facility] INVASIVE CV LAB;  Service: Cardiovascular;  Laterality: Right;   ABDOMINAL HYSTERECTOMY     AV FISTULA PLACEMENT  09/01/2011   Procedure: ARTERIOVENOUS (AV) FISTULA CREATION;  Surgeon: Larina Earthly, MD;  Location: [facility] OR;  Service: Vascular;   Laterality: Right;   CESAREAN SECTION  1997   EYE SURGERY     for lazy eye   ORIF FEMUR FRACTURE Left 08/29/2021   Procedure: OPEN REDUCTION INTERNAL FIXATION (ORIF) DISTAL FEMUR FRACTURE;  Surgeon: Roby Lofts, MD;  Location: [facility] OR;  Service: Orthopedics;  Laterality: Left;   ORIF WRIST FRACTURE Left 08/29/2021   Procedure: OPEN REDUCTION INTERNAL FIXATION (ORIF) WRIST FRACTURE;  Surgeon: Roby Lofts, MD;  Location: [facility] OR;  Service: Orthopedics;  Laterality: Left;   PERIPHERAL VASCULAR BALLOON ANGIOPLASTY  07/10/2022   Procedure: PERIPHERAL VASCULAR BALLOON ANGIOPLASTY;  Surgeon: Cephus Shelling, MD;  Location: [facility];  Service: Cardiovascular;;  TIBIA IM NAIL INSERTION Left 05/19/2018   Procedure: INTRAMEDULLARY (IM) NAIL TIBIAL;  Surgeon: Bjorn Pippin, MD;  Location: [facility] OR;  Service: Orthopedics;  Laterality: Left;   TIBIA IM NAIL INSERTION Right 08/29/2021   Procedure: INTRAMEDULLARY (IM) NAIL TIBIAL WITH IRRIGATION AND DEBRIDMENT;  Surgeon: Roby Lofts, MD;  Location: [facility] OR;  Service: Orthopedics;  Laterality: Right;    Allergies:  Allergies  Allergen Reactions   E-Mycin [Erythromycin] Hives   Sulfa Antibiotics Rash    Skin Rash     Lisinopril Cough    Medications:   Prior to Admission medications   Medication Sig Start Date End Date Taking? Authorizing Provider  acetaminophen (TYLENOL) 500 MG tablet Take 500 mg by mouth every 8 (eight) hours as needed for moderate pain. No more than 3 a day   Yes [provider]  aspirin EC 81 MG tablet Take 81 mg by mouth at bedtime. Swallow whole.   Yes [provider]  atorvastatin (LIPITOR) 40 MG tablet Take 40 mg by mouth at bedtime.   Yes [provider]  Cinacalcet HCl (SENSIPAR PO) Take 1 tablet by mouth every Monday, Wednesday, and Friday with hemodialysis.   Yes [provider]  ferric citrate (AURYXIA) 1 GM 210 MG(Fe) tablet Take 210 mg by mouth 3 (three) times daily with  meals.   Yes [provider]  fluticasone (FLONASE) 50 MCG/ACT nasal spray Place 1 spray into both nostrils daily as needed for allergies or rhinitis.   Yes [provider]  isosorbide mononitrate (IMDUR) 30 MG 24 hr tablet Take 0.5 tablets (15 mg total) by mouth daily. Patient taking differently: Take 30 mg by mouth daily. 02/01/23  Yes Burnadette Pop, MD  loperamide (IMODIUM A-D) 2 MG tablet Take 2 mg by mouth daily as needed for diarrhea or loose stools.   Yes [provider]  loratadine (CLARITIN) 10 MG tablet Take 10 mg by mouth daily as needed for allergies.   Yes [provider]  losartan (COZAAR) 25 MG tablet Take 0.5 tablets (12.5 mg total) by mouth daily. Patient taking differently: Take 50 mg by mouth daily. 02/01/23  Yes Burnadette Pop, MD  metoprolol succinate (TOPROL-XL) 25 MG 24 hr tablet Take 25 mg by mouth daily. 03/16/23  Yes [provider]  nitroGLYCERIN (NITROSTAT) 0.4 MG SL tablet Place 0.4 mg under the tongue every 5 (five) minutes as needed for chest pain.   Yes [provider]  pantoprazole (PROTONIX) 40 MG tablet Take 40 mg by mouth 2 (two) times daily. 02/26/20  Yes [provider]  Darbepoetin Alfa (ARANESP) 150 MCG/0.3ML SOSY injection Inject 0.3 mLs (150 mcg total) into the vein every Wednesday with hemodialysis. 09/25/21   Setzer, Lynnell Jude, PA-C  doxercalciferol (HECTOROL) 4 MCG/2ML injection Inject 4 mLs (8 mcg total) into the vein every Monday, Wednesday, and Friday with hemodialysis. 09/25/21   Setzer, Lynnell Jude, PA-C    Discontinued Meds:   Medications Discontinued During This Encounter  Medication Reason   carvedilol (COREG) 12.5 MG tablet Change in therapy    Social History:  reports that she quit smoking about 8 years ago. Her smoking use included cigarettes. She started smoking about 43 years ago. She has a 52.4 pack-year smoking history. She quit smokeless tobacco use about 9 years ago. She  reports that she does not drink alcohol and does not use drugs.  Family History:   Family History  Problem Relation Age of Onset   Arthritis Mother  Kidney disease Father    Polycystic kidney disease Son    Asthma Son    Hypertension Maternal Grandmother     There were no vitals taken for this visit. General: Alert ill thin female NAD Heart: RRR no MRG Lungs: CTA bilaterally nonlabored Abdomen: NABS soft NT ND Extremities: No pedal edema Dialysis Access: Right arm AV fistula slight aneurysmal, hypopigmented areas, positive bruit       Vidur Knust, Len Blalock, MD 04/30/2023, 8:45 AM

## 2023-05-13 ENCOUNTER — Encounter (HOSPITAL_COMMUNITY): Payer: Self-pay | Admitting: Emergency Medicine

## 2023-05-13 ENCOUNTER — Emergency Department (HOSPITAL_BASED_OUTPATIENT_CLINIC_OR_DEPARTMENT_OTHER)

## 2023-05-13 ENCOUNTER — Emergency Department (HOSPITAL_COMMUNITY)
Admission: EM | Admit: 2023-05-13 | Discharge: 2023-05-13 | Disposition: A | Discharge status: Home / Self Care | Attending: Emergency Medicine | Admitting: Emergency Medicine

## 2023-05-13 DIAGNOSIS — Z992 Dependence on renal dialysis: Secondary | ICD-10-CM | POA: Insufficient documentation

## 2023-05-13 DIAGNOSIS — Z7982 Long term (current) use of aspirin: Secondary | ICD-10-CM | POA: Insufficient documentation

## 2023-05-13 DIAGNOSIS — M79605 Pain in left leg: Secondary | ICD-10-CM | POA: Insufficient documentation

## 2023-05-13 DIAGNOSIS — E871 Hypo-osmolality and hyponatremia: Secondary | ICD-10-CM | POA: Insufficient documentation

## 2023-05-13 DIAGNOSIS — E876 Hypokalemia: Secondary | ICD-10-CM | POA: Insufficient documentation

## 2023-05-13 DIAGNOSIS — N186 End stage renal disease: Secondary | ICD-10-CM | POA: Diagnosis not present

## 2023-05-13 LAB — BASIC METABOLIC PANEL WITH GFR
Anion gap: 11 (ref 5–15)
BUN: 6 mg/dL — ABNORMAL LOW (ref 8–23)
CO2: 31 mmol/L (ref 22–32)
Calcium: 8.8 mg/dL — ABNORMAL LOW (ref 8.9–10.3)
Chloride: 91 mmol/L — ABNORMAL LOW (ref 98–111)
Creatinine, Ser: 3.31 mg/dL — ABNORMAL HIGH (ref 0.44–1.00)
GFR, Estimated: 15 mL/min — ABNORMAL LOW (ref >60)
Glucose, Bld: 93 mg/dL (ref 70–99)
Potassium: 3.2 mmol/L — ABNORMAL LOW (ref 3.5–5.1)
Sodium: 133 mmol/L — ABNORMAL LOW (ref 135–145)

## 2023-05-13 LAB — CBC WITH DIFFERENTIAL/PLATELET
Abs Immature Granulocytes: 0.03 10*3/uL (ref 0.00–0.07)
Basophils Absolute: 0 10*3/uL (ref 0.0–0.1)
Basophils Relative: 1 %
Eosinophils Absolute: 0 10*3/uL (ref 0.0–0.5)
Eosinophils Relative: 0 %
HCT: 32.2 % — ABNORMAL LOW (ref 36.0–46.0)
Hemoglobin: 10.3 g/dL — ABNORMAL LOW (ref 12.0–15.0)
Immature Granulocytes: 1 %
Lymphocytes Relative: 21 %
Lymphs Abs: 0.7 10*3/uL (ref 0.7–4.0)
MCH: 31.2 pg (ref 26.0–34.0)
MCHC: 32 g/dL (ref 30.0–36.0)
MCV: 97.6 fL (ref 80.0–100.0)
Monocytes Absolute: 0.2 10*3/uL (ref 0.1–1.0)
Monocytes Relative: 6 %
Neutro Abs: 2.4 10*3/uL (ref 1.7–7.7)
Neutrophils Relative %: 71 %
Platelets: 78 10*3/uL — ABNORMAL LOW (ref 150–400)
RBC: 3.3 MIL/uL — ABNORMAL LOW (ref 3.87–5.11)
RDW: 17.7 % — ABNORMAL HIGH (ref 11.5–15.5)
WBC: 3.4 10*3/uL — ABNORMAL LOW (ref 4.0–10.5)
nRBC: 0 % (ref 0.0–0.2)

## 2023-05-13 MED ORDER — LIDOCAINE 5 % EX PTCH: 1.0000 | MEDICATED_PATCH | CUTANEOUS | 0 refills | Status: DC

## 2023-05-13 MED ORDER — HYDROCODONE-ACETAMINOPHEN 5-325 MG PO TABS
1.0000 | ORAL_TABLET | Freq: Once | ORAL | Status: AC
  Administered 2023-05-13: 1 via ORAL
  Filled 2023-05-13: qty 1

## 2023-05-13 MED ORDER — LIDOCAINE 5 % EX PTCH
1.0000 | MEDICATED_PATCH | CUTANEOUS | Status: DC
  Administered 2023-05-13: 1 via TRANSDERMAL
  Filled 2023-05-13: qty 1

## 2023-05-13 NOTE — ED Provider Notes (Addendum)
  EMERGENCY DEPARTMENT AT [facility] Provider Note   CSN: [id number] Arrival date & time: 05/13/23  1105     History  Chief Complaint  Patient presents with   Leg Pain    Kendra Clay is a 63 y.o. female past medical history significant for chronic HFreF, polycystic kidney disease, muscle spasms in legs and ESRD who presents to the ED today for evaluation of left leg pain after dialysis. Pt states that she received dialysis approx 2 hours ago and has been experiencing LLE pain since. Pt currently having difficulty fully describing pain but she states that it is constant and located in the anterior portion of her LLE. Pt denies calf swelling or tenderness, injury, SOB, CP, palpitations, dizziness, lightheadedness, fever, chills, N/V/D.    Leg Pain      Home Medications Prior to Admission medications   Medication Sig Start Date End Date Taking? Authorizing Provider  acetaminophen (TYLENOL) 500 MG tablet Take 500 mg by mouth every 8 (eight) hours as needed for moderate pain. No more than 3 a day    [provider]  aspirin EC 81 MG tablet Take 81 mg by mouth at bedtime. Swallow whole.    [provider]  atorvastatin (LIPITOR) 40 MG tablet Take 40 mg by mouth at bedtime.    [provider]  Cinacalcet HCl (SENSIPAR PO) Take 1 tablet by mouth every Monday, Wednesday, and Friday with hemodialysis.    [provider]  Darbepoetin Alfa (ARANESP) 150 MCG/0.3ML SOSY injection Inject 0.3 mLs (150 mcg total) into the vein every Wednesday with hemodialysis. 09/25/21   Setzer, Lynnell Jude, PA-C  doxercalciferol (HECTOROL) 4 MCG/2ML injection Inject 4 mLs (8 mcg total) into the vein every Monday, Wednesday, and Friday with hemodialysis. 09/25/21   Setzer, Lynnell Jude, PA-C  ferric citrate (AURYXIA) 1 GM 210 MG(Fe) tablet Take 210 mg by mouth 3 (three) times daily with meals.    [provider]  fluticasone (FLONASE) 50 MCG/ACT nasal spray  Place 1 spray into both nostrils daily as needed for allergies or rhinitis.    [provider]  isosorbide mononitrate (IMDUR) 30 MG 24 hr tablet Take 0.5 tablets (15 mg total) by mouth daily. Patient taking differently: Take 30 mg by mouth daily. 02/01/23   Burnadette Pop, MD  loperamide (IMODIUM A-D) 2 MG tablet Take 2 mg by mouth daily as needed for diarrhea or loose stools.    [provider]  loratadine (CLARITIN) 10 MG tablet Take 10 mg by mouth daily as needed for allergies.    [provider]  losartan (COZAAR) 25 MG tablet Take 0.5 tablets (12.5 mg total) by mouth daily. Patient taking differently: Take 50 mg by mouth daily. 02/01/23   Burnadette Pop, MD  metoprolol succinate (TOPROL-XL) 25 MG 24 hr tablet Take 25 mg by mouth daily. 03/16/23   [provider]  nitroGLYCERIN (NITROSTAT) 0.4 MG SL tablet Place 0.4 mg under the tongue every 5 (five) minutes as needed for chest pain.    [provider]  pantoprazole (PROTONIX) 40 MG tablet Take 40 mg by mouth 2 (two) times daily. 02/26/20   [provider]      Allergies    E-mycin [erythromycin], Sulfa antibiotics, and Lisinopril    Review of Systems   Review of Systems  Musculoskeletal:  Positive for arthralgias.    Physical Exam Updated Vital Signs BP (!) 176/89 (BP Location: Left Arm)   Pulse 83   Temp  97.8 F (36.6 C) (Oral)   Resp 17   SpO2 97%  Physical Exam Vitals and nursing note reviewed.  Constitutional:      General: She is not in acute distress.    Appearance: Normal appearance. She is well-developed. She is not ill-appearing, toxic-appearing or diaphoretic.  HENT:     Head: Normocephalic and atraumatic.     Right Ear: External ear normal.     Left Ear: External ear normal.  Eyes:     Extraocular Movements: Extraocular movements intact.     Conjunctiva/sclera: Conjunctivae normal.  Cardiovascular:     Rate and Rhythm: Normal rate and regular rhythm.      Pulses: Normal pulses.     Heart sounds: Normal heart sounds. No murmur heard. Pulmonary:     Effort: Pulmonary effort is normal. No respiratory distress.     Breath sounds: Normal breath sounds.  Abdominal:     Palpations: Abdomen is soft.     Tenderness: There is no abdominal tenderness.  Musculoskeletal:        General: No swelling, deformity or signs of injury.     Cervical back: Normal range of motion and neck supple.     Right lower leg: No edema.     Left lower leg: No edema.     Comments: Patient has tenderness to palpation of the posterior left knee and anterior left tibia.  +2 dorsalis pedis pulses bilaterally.  No deformity, ecchymosis, or erythema.  Patient does appear to have some twitching of the left posterior calf muscles.  Skin:    General: Skin is warm and dry.     Capillary Refill: Capillary refill takes less than 2 seconds.  Neurological:     General: No focal deficit present.     Mental Status: She is alert.     Sensory: No sensory deficit.  Psychiatric:        Mood and Affect: Mood normal.     ED Results / Procedures / Treatments   Labs (all labs ordered are listed, but only abnormal results are displayed) Labs Reviewed  BASIC METABOLIC PANEL WITH GFR - Abnormal; Notable for the following components:      Result Value   Sodium 133 (*)    Potassium 3.2 (*)    Chloride 91 (*)    BUN 6 (*)    Creatinine, Ser 3.31 (*)    Calcium 8.8 (*)    GFR, Estimated 15 (*)    All other components within normal limits  CBC WITH DIFFERENTIAL/PLATELET - Abnormal; Notable for the following components:   WBC 3.4 (*)    RBC 3.30 (*)    Hemoglobin 10.3 (*)    HCT 32.2 (*)    RDW 17.7 (*)    Platelets 78 (*)    All other components within normal limits    EKG None  Radiology VAS [place] LOWER EXTREMITY VENOUS (DVT) (7a-7p) Result Date: 05/13/2023  Lower Venous DVT Study Patient Name:  Kendra Clay  Date of Exam:   05/13/2023 Medical Rec #: [id number]         Accession  #:    [id number] Date of Birth: 04-Feb-1960         Patient Gender: F Patient Age:   64 years Exam Location:  [facility] Procedure:      VAS [place] LOWER EXTREMITY VENOUS (DVT) Referring Phys: Melina Modena --------------------------------------------------------------------------------  Indications: Pain.  Comparison Study: No previous exams Performing Technologist: Jody Hill RVT, RDMS  Examination  Guidelines: A complete evaluation includes B-mode imaging, spectral Doppler, color Doppler, and power Doppler as needed of all accessible portions of each vessel. Bilateral testing is considered an integral part of a complete examination. Limited examinations for reoccurring indications may be performed as noted. The reflux portion of the exam is performed with the patient in reverse Trendelenburg.  +-----+---------------+---------+-----------+----------+--------------+ RIGHTCompressibilityPhasicitySpontaneityPropertiesThrombus Aging +-----+---------------+---------+-----------+----------+--------------+ CFV  Full           Yes      Yes                                 +-----+---------------+---------+-----------+----------+--------------+   +---------+---------------+---------+-----------+----------+--------------+ LEFT     CompressibilityPhasicitySpontaneityPropertiesThrombus Aging +---------+---------------+---------+-----------+----------+--------------+ CFV      Full           Yes      Yes                                 +---------+---------------+---------+-----------+----------+--------------+ SFJ      Full                                                        +---------+---------------+---------+-----------+----------+--------------+ FV Prox  Full           Yes      Yes                                 +---------+---------------+---------+-----------+----------+--------------+ FV Mid   Full           Yes      Yes                                  +---------+---------------+---------+-----------+----------+--------------+ FV DistalFull           Yes      Yes                                 +---------+---------------+---------+-----------+----------+--------------+ PFV      Full                                                        +---------+---------------+---------+-----------+----------+--------------+ POP      Full           Yes      Yes                                 +---------+---------------+---------+-----------+----------+--------------+ PTV      Full                                                        +---------+---------------+---------+-----------+----------+--------------+ PERO     Full                                                        +---------+---------------+---------+-----------+----------+--------------+  Summary: RIGHT: - No evidence of common femoral vein obstruction.   LEFT: - There is no evidence of deep vein thrombosis in the lower extremity.  - No cystic structure found in the popliteal fossa.  *See table(s) above for measurements and observations.    Preliminary     Procedures Procedures    Medications Ordered in ED Medications  lidocaine (LIDODERM) 5 % 1 patch (has no administration in time range)  HYDROcodone-acetaminophen (NORCO/VICODIN) 5-325 MG per tablet 1 tablet (1 tablet Oral Given 05/13/23 1255)    ED Course/ Medical Decision Making/ A&P                                 Medical Decision Making Amount and/or Complexity of Data Reviewed Labs: ordered.  Risk Prescription drug management.   This patient presents to the ED with chief complaint(s) of left lower extremity pain with pertinent past medical history of muscle spasms, ESRD which further complicates the presenting complaint. The complaint involves an extensive differential diagnosis and also carries with it a high risk of complications and morbidity.    The differential diagnosis includes DVT, muscle  spasm, electrolyte abnormality  ED Course and Reassessment:   Independent labs interpretation:  The following labs were independently interpreted:  CBC: Anemia which is chronic per historical values BMP: mildly hypokalemic at 3.2, mild hyponatremia 133, elevated creatinine and decreased GFR consistent with ESRD  Independent visualization of imaging: - I independently visualized the following imaging with scope of interpretation limited to determining acute life threatening conditions related to emergency care: Left [place] lower extremity venous, which revealed no evidence of DVT in left lower extremity, no cystic structure in the popliteal fossa  Consultation: - Consulted or discussed management/test interpretation w/ external professional: None  Patient eloped prior to discharge.    Final Clinical Impression(s) / ED Diagnoses Final diagnoses:  Left leg pain    Rx / DC Orders ED Discharge Orders          Ordered    lidocaine (LIDODERM) 5 %  Every 24 hours,   Status:  Discontinued        05/13/23 1452              Dolphus Jenny, PA-C 05/13/23 1455    Rolan Bucco, MD 05/13/23 1500    Dolphus Jenny, PA-C 05/13/23 1501    Rolan Bucco, MD 05/13/23 1529

## 2023-05-13 NOTE — ED Triage Notes (Signed)
 Pt here from home with c/o left leg pain after full dialysis today , no trauma noted

## 2023-05-13 NOTE — Progress Notes (Signed)
 LLE venous duplex has been completed.  Preliminary results given to Melina Modena, PA-C.   Results can be found under chart review under CV PROC. 05/13/2023 2:50 PM Davidlee Jeanbaptiste RVT, RDMS

## 2023-05-19 ENCOUNTER — Emergency Department (HOSPITAL_COMMUNITY)

## 2023-05-19 ENCOUNTER — Inpatient Hospital Stay (HOSPITAL_COMMUNITY)
Admission: EM | Admit: 2023-05-19 | Discharge: 2023-05-23 | DRG: 377 | Disposition: A | Discharge status: Home Health | Attending: Internal Medicine | Admitting: Internal Medicine

## 2023-05-19 ENCOUNTER — Observation Stay (HOSPITAL_COMMUNITY)

## 2023-05-19 ENCOUNTER — Other Ambulatory Visit: Payer: Self-pay

## 2023-05-19 ENCOUNTER — Encounter (HOSPITAL_COMMUNITY): Payer: Self-pay

## 2023-05-19 DIAGNOSIS — Z825 Family history of asthma and other chronic lower respiratory diseases: Secondary | ICD-10-CM

## 2023-05-19 DIAGNOSIS — Z7982 Long term (current) use of aspirin: Secondary | ICD-10-CM

## 2023-05-19 DIAGNOSIS — N2581 Secondary hyperparathyroidism of renal origin: Secondary | ICD-10-CM | POA: Diagnosis present

## 2023-05-19 DIAGNOSIS — R319 Hematuria, unspecified: Secondary | ICD-10-CM

## 2023-05-19 DIAGNOSIS — B9620 Unspecified Escherichia coli [E. coli] as the cause of diseases classified elsewhere: Secondary | ICD-10-CM | POA: Diagnosis present

## 2023-05-19 DIAGNOSIS — K922 Gastrointestinal hemorrhage, unspecified: Principal | ICD-10-CM

## 2023-05-19 DIAGNOSIS — Z888 Allergy status to other drugs, medicaments and biological substances status: Secondary | ICD-10-CM

## 2023-05-19 DIAGNOSIS — I2510 Atherosclerotic heart disease of native coronary artery without angina pectoris: Secondary | ICD-10-CM | POA: Diagnosis present

## 2023-05-19 DIAGNOSIS — I5042 Chronic combined systolic (congestive) and diastolic (congestive) heart failure: Secondary | ICD-10-CM | POA: Diagnosis present

## 2023-05-19 DIAGNOSIS — Z79899 Other long term (current) drug therapy: Secondary | ICD-10-CM

## 2023-05-19 DIAGNOSIS — D638 Anemia in other chronic diseases classified elsewhere: Secondary | ICD-10-CM | POA: Diagnosis present

## 2023-05-19 DIAGNOSIS — Z87891 Personal history of nicotine dependence: Secondary | ICD-10-CM

## 2023-05-19 DIAGNOSIS — Z8271 Family history of polycystic kidney: Secondary | ICD-10-CM

## 2023-05-19 DIAGNOSIS — K5521 Angiodysplasia of colon with hemorrhage: Principal | ICD-10-CM | POA: Diagnosis present

## 2023-05-19 DIAGNOSIS — Z8261 Family history of arthritis: Secondary | ICD-10-CM

## 2023-05-19 DIAGNOSIS — Q613 Polycystic kidney, unspecified: Secondary | ICD-10-CM

## 2023-05-19 DIAGNOSIS — K648 Other hemorrhoids: Secondary | ICD-10-CM | POA: Diagnosis present

## 2023-05-19 DIAGNOSIS — I10 Essential (primary) hypertension: Secondary | ICD-10-CM | POA: Diagnosis present

## 2023-05-19 DIAGNOSIS — N186 End stage renal disease: Secondary | ICD-10-CM

## 2023-05-19 DIAGNOSIS — E538 Deficiency of other specified B group vitamins: Secondary | ICD-10-CM | POA: Diagnosis present

## 2023-05-19 DIAGNOSIS — Z841 Family history of disorders of kidney and ureter: Secondary | ICD-10-CM

## 2023-05-19 DIAGNOSIS — Z881 Allergy status to other antibiotic agents status: Secondary | ICD-10-CM

## 2023-05-19 DIAGNOSIS — K219 Gastro-esophageal reflux disease without esophagitis: Secondary | ICD-10-CM | POA: Diagnosis present

## 2023-05-19 DIAGNOSIS — E871 Hypo-osmolality and hyponatremia: Secondary | ICD-10-CM | POA: Diagnosis present

## 2023-05-19 DIAGNOSIS — Z992 Dependence on renal dialysis: Secondary | ICD-10-CM

## 2023-05-19 DIAGNOSIS — N3289 Other specified disorders of bladder: Secondary | ICD-10-CM | POA: Diagnosis present

## 2023-05-19 DIAGNOSIS — D696 Thrombocytopenia, unspecified: Secondary | ICD-10-CM | POA: Diagnosis present

## 2023-05-19 DIAGNOSIS — I5022 Chronic systolic (congestive) heart failure: Secondary | ICD-10-CM | POA: Diagnosis present

## 2023-05-19 DIAGNOSIS — Z882 Allergy status to sulfonamides status: Secondary | ICD-10-CM

## 2023-05-19 DIAGNOSIS — E785 Hyperlipidemia, unspecified: Secondary | ICD-10-CM | POA: Insufficient documentation

## 2023-05-19 DIAGNOSIS — I132 Hypertensive heart and chronic kidney disease with heart failure and with stage 5 chronic kidney disease, or end stage renal disease: Secondary | ICD-10-CM | POA: Diagnosis present

## 2023-05-19 DIAGNOSIS — D631 Anemia in chronic kidney disease: Secondary | ICD-10-CM | POA: Diagnosis present

## 2023-05-19 DIAGNOSIS — Z8249 Family history of ischemic heart disease and other diseases of the circulatory system: Secondary | ICD-10-CM

## 2023-05-19 DIAGNOSIS — I959 Hypotension, unspecified: Secondary | ICD-10-CM | POA: Diagnosis present

## 2023-05-19 DIAGNOSIS — Z955 Presence of coronary angioplasty implant and graft: Secondary | ICD-10-CM

## 2023-05-19 DIAGNOSIS — Z90710 Acquired absence of both cervix and uterus: Secondary | ICD-10-CM

## 2023-05-19 DIAGNOSIS — N3001 Acute cystitis with hematuria: Secondary | ICD-10-CM

## 2023-05-19 DIAGNOSIS — R310 Gross hematuria: Secondary | ICD-10-CM | POA: Diagnosis present

## 2023-05-19 LAB — URINALYSIS, W/ REFLEX TO CULTURE (INFECTION SUSPECTED): RBC / HPF: >50 RBC/hpf (ref 0–5)

## 2023-05-19 LAB — COMPREHENSIVE METABOLIC PANEL WITH GFR
ALT: 7 U/L (ref 0–44)
AST: 13 U/L — ABNORMAL LOW (ref 15–41)
Albumin: 2.6 g/dL — ABNORMAL LOW (ref 3.5–5.0)
Alkaline Phosphatase: 112 U/L (ref 38–126)
Anion gap: 12 (ref 5–15)
BUN: 16 mg/dL (ref 8–23)
CO2: 29 mmol/L (ref 22–32)
Calcium: 9.4 mg/dL (ref 8.9–10.3)
Chloride: 93 mmol/L — ABNORMAL LOW (ref 98–111)
Creatinine, Ser: 5.67 mg/dL — ABNORMAL HIGH (ref 0.44–1.00)
GFR, Estimated: 8 mL/min — ABNORMAL LOW (ref >60)
Glucose, Bld: 85 mg/dL (ref 70–99)
Potassium: 3.7 mmol/L (ref 3.5–5.1)
Sodium: 134 mmol/L — ABNORMAL LOW (ref 135–145)
Total Bilirubin: 1.4 mg/dL — ABNORMAL HIGH (ref 0.0–1.2)
Total Protein: 7.2 g/dL (ref 6.5–8.1)

## 2023-05-19 LAB — POC OCCULT BLOOD, ED: Fecal Occult Bld: POSITIVE — AB

## 2023-05-19 LAB — CBC WITH DIFFERENTIAL/PLATELET
Abs Immature Granulocytes: 0.01 10*3/uL (ref 0.00–0.07)
Basophils Absolute: 0.1 10*3/uL (ref 0.0–0.1)
Basophils Relative: 1 %
Eosinophils Absolute: 0.1 10*3/uL (ref 0.0–0.5)
Eosinophils Relative: 2 %
HCT: 30.1 % — ABNORMAL LOW (ref 36.0–46.0)
Hemoglobin: 9.4 g/dL — ABNORMAL LOW (ref 12.0–15.0)
Immature Granulocytes: 0 %
Lymphocytes Relative: 42 %
Lymphs Abs: 1.8 10*3/uL (ref 0.7–4.0)
MCH: 31.2 pg (ref 26.0–34.0)
MCHC: 31.2 g/dL (ref 30.0–36.0)
MCV: 100 fL (ref 80.0–100.0)
Monocytes Absolute: 0.3 10*3/uL (ref 0.1–1.0)
Monocytes Relative: 8 %
Neutro Abs: 2 10*3/uL (ref 1.7–7.7)
Neutrophils Relative %: 47 %
Platelets: 71 10*3/uL — ABNORMAL LOW (ref 150–400)
RBC: 3.01 MIL/uL — ABNORMAL LOW (ref 3.87–5.11)
RDW: 18.6 % — ABNORMAL HIGH (ref 11.5–15.5)
WBC: 4.2 10*3/uL (ref 4.0–10.5)
nRBC: 0 % (ref 0.0–0.2)

## 2023-05-19 MED ORDER — IOHEXOL 350 MG/ML SOLN
75.0000 mL | Freq: Once | INTRAVENOUS | Status: AC | PRN
  Administered 2023-05-19: 75 mL via INTRAVENOUS

## 2023-05-19 MED ORDER — SODIUM CHLORIDE 0.9 % IV SOLN
INTRAVENOUS | Status: AC
  Filled 2023-05-19: qty 10

## 2023-05-19 MED ORDER — ONDANSETRON HCL 4 MG PO TABS
4.0000 mg | ORAL_TABLET | Freq: Four times a day (QID) | ORAL | Status: DC | PRN
  Filled 2023-05-19: qty 1

## 2023-05-19 MED ORDER — ATORVASTATIN CALCIUM 40 MG PO TABS
40.0000 mg | ORAL_TABLET | Freq: Every day | ORAL | Status: DC
  Administered 2023-05-19 – 2023-05-22 (×3): 40 mg via ORAL
  Filled 2023-05-19 (×4): qty 1

## 2023-05-19 MED ORDER — CINACALCET HCL 30 MG PO TABS
30.0000 mg | ORAL_TABLET | ORAL | Status: DC
  Administered 2023-05-20 – 2023-05-22 (×2): 30 mg via ORAL
  Filled 2023-05-19 (×2): qty 1

## 2023-05-19 MED ORDER — LOSARTAN POTASSIUM 50 MG PO TABS
50.0000 mg | ORAL_TABLET | Freq: Every day | ORAL | Status: DC
Start: 2023-05-19 — End: 2023-05-23
  Administered 2023-05-19 – 2023-05-23 (×4): 50 mg via ORAL
  Filled 2023-05-19 (×4): qty 1

## 2023-05-19 MED ORDER — DOXERCALCIFEROL 4 MCG/2ML IV SOLN
5.0000 ug | INTRAVENOUS | Status: DC
  Administered 2023-05-21 – 2023-05-22 (×2): 5 ug via INTRAVENOUS
  Filled 2023-05-19 (×4): qty 4

## 2023-05-19 MED ORDER — METOPROLOL SUCCINATE ER 25 MG PO TB24
25.0000 mg | ORAL_TABLET | Freq: Every day | ORAL | Status: DC
  Administered 2023-05-20 – 2023-05-23 (×3): 25 mg via ORAL
  Filled 2023-05-19 (×3): qty 1

## 2023-05-19 MED ORDER — SENNOSIDES-DOCUSATE SODIUM 8.6-50 MG PO TABS: 1.0000 | ORAL_TABLET | Freq: Every evening | ORAL | Status: DC | PRN

## 2023-05-19 MED ORDER — PANTOPRAZOLE SODIUM 40 MG PO TBEC
40.0000 mg | DELAYED_RELEASE_TABLET | Freq: Two times a day (BID) | ORAL | Status: DC
  Administered 2023-05-19 – 2023-05-23 (×6): 40 mg via ORAL
  Filled 2023-05-19 (×7): qty 1

## 2023-05-19 MED ORDER — ACETAMINOPHEN 650 MG RE SUPP: 650.0000 mg | Freq: Four times a day (QID) | RECTAL | Status: DC | PRN

## 2023-05-19 MED ORDER — CEFTRIAXONE SODIUM 1 G IJ SOLR
1.0000 g | INTRAMUSCULAR | Status: AC
  Administered 2023-05-19 – 2023-05-21 (×3): 1 g via INTRAVENOUS
  Filled 2023-05-19 (×2): qty 10

## 2023-05-19 MED ORDER — ONDANSETRON HCL 4 MG/2ML IJ SOLN
4.0000 mg | Freq: Four times a day (QID) | INTRAMUSCULAR | Status: DC | PRN
  Administered 2023-05-20 – 2023-05-22 (×2): 4 mg via INTRAVENOUS
  Filled 2023-05-19 (×3): qty 2

## 2023-05-19 MED ORDER — CHLORHEXIDINE GLUCONATE CLOTH 2 % EX PADS
6.0000 | MEDICATED_PAD | Freq: Every day | CUTANEOUS | Status: DC
  Administered 2023-05-23: 6 via TOPICAL

## 2023-05-19 MED ORDER — SODIUM CHLORIDE 0.9% FLUSH
3.0000 mL | Freq: Two times a day (BID) | INTRAVENOUS | Status: DC
  Administered 2023-05-19 – 2023-05-23 (×6): 3 mL via INTRAVENOUS

## 2023-05-19 MED ORDER — ACETAMINOPHEN 325 MG PO TABS: 650.0000 mg | ORAL_TABLET | Freq: Four times a day (QID) | ORAL | Status: DC | PRN

## 2023-05-19 MED ORDER — ISOSORBIDE MONONITRATE ER 30 MG PO TB24
30.0000 mg | ORAL_TABLET | Freq: Every day | ORAL | Status: DC
Start: 2023-05-19 — End: 2023-05-23
  Administered 2023-05-19 – 2023-05-23 (×4): 30 mg via ORAL
  Filled 2023-05-19 (×4): qty 1

## 2023-05-19 NOTE — Progress Notes (Signed)
 Informed of ESRD patient in ER by primary service. Presents with GIB. Last HD yesterday. Consult requested for provision of routine dialysis. Outpatient orders: [facility], MWF. 3 hrs 15 min. EDW 53.5KG. AVF 16G, flow rate: 350/autoflow 1.5. 2k/2cal. Heparin: 1,200 units bolus. Meds: Mircera 120mcg every 2 weeks (last dose 4/7), venofer 50mg  qweekly, hectorol 5mcg every treatment, sensipar 30mg  every treatment. Plan: -routine HD ordered for tomorrow, no heparin. Full consult to follow tomorrow. Please call with any questions/concerns in the interim  Cristi Donalds, MD [facility]

## 2023-05-19 NOTE — ED Triage Notes (Signed)
 PT BIB EMS from home with complains of bright red blood in urine and stool first noticed this morning at 330 am. Some abd discomfort no vomiting or diarrhea. Pt unsure if she has hemorrhoids but denies any rectal pain

## 2023-05-19 NOTE — ED Provider Notes (Addendum)
  EMERGENCY DEPARTMENT AT [facility] Provider Note   CSN: [id number] Arrival date & time: 05/19/23  1340     History  Chief Complaint  Patient presents with  . Hematuria  . Rectal Bleeding    Kendra Clay is a 63 y.o. female history of ESRD on dialysis (last HD was yesterday), here presenting with hematuria versus blood in her stool.  Patient states that she noticed some blood when she urinated today.  She states that she usually do not urinate that much but urinated 2-3 times.  She states that when she wipes it was bloody.  She also was unsure if the blood is coming from her rectum.  Patient is on baby aspirin and no blood thinners.  The history is provided by the patient.       Home Medications Prior to Admission medications   Medication Sig Start Date End Date Taking? Authorizing Provider  acetaminophen (TYLENOL) 500 MG tablet Take 500 mg by mouth every 8 (eight) hours as needed for moderate pain. No more than 3 a day    [provider]  aspirin EC 81 MG tablet Take 81 mg by mouth at bedtime. Swallow whole.    [provider]  atorvastatin (LIPITOR) 40 MG tablet Take 40 mg by mouth at bedtime.    [provider]  Cinacalcet HCl (SENSIPAR PO) Take 1 tablet by mouth every Monday, Wednesday, and Friday with hemodialysis.    [provider]  Darbepoetin Alfa (ARANESP) 150 MCG/0.3ML SOSY injection Inject 0.3 mLs (150 mcg total) into the vein every Wednesday with hemodialysis. 09/25/21   Setzer, Sandra J, PA-C  doxercalciferol (HECTOROL) 4 MCG/2ML injection Inject 4 mLs (8 mcg total) into the vein every Monday, Wednesday, and Friday with hemodialysis. 09/25/21   Setzer, Sandra J, PA-C  ferric citrate (AURYXIA) 1 GM 210 MG(Fe) tablet Take 210 mg by mouth 3 (three) times daily with meals.    [provider]  fluticasone (FLONASE) 50 MCG/ACT nasal spray Place 1 spray into both nostrils daily as needed for allergies or  rhinitis.    [provider]  isosorbide mononitrate (IMDUR) 30 MG 24 hr tablet Take 0.5 tablets (15 mg total) by mouth daily. Patient taking differently: Take 30 mg by mouth daily. 02/01/23   Adhikari, Amrit, MD  loperamide (IMODIUM A-D) 2 MG tablet Take 2 mg by mouth daily as needed for diarrhea or loose stools.    [provider]  loratadine (CLARITIN) 10 MG tablet Take 10 mg by mouth daily as needed for allergies.    [provider]  losartan (COZAAR) 25 MG tablet Take 0.5 tablets (12.5 mg total) by mouth daily. Patient taking differently: Take 50 mg by mouth daily. 02/01/23   Adhikari, Amrit, MD  metoprolol succinate (TOPROL-XL) 25 MG 24 hr tablet Take 25 mg by mouth daily. 03/16/23   [provider]  nitroGLYCERIN (NITROSTAT) 0.4 MG SL tablet Place 0.4 mg under the tongue every 5 (five) minutes as needed for chest pain.    [provider]  pantoprazole (PROTONIX) 40 MG tablet Take 40 mg by mouth 2 (two) times daily. 02/26/20   [provider]      Allergies    E-mycin [erythromycin], Sulfa antibiotics, and Lisinopril    Review of Systems   Review of Systems  Genitourinary:  Positive for hematuria.  All other systems reviewed and are negative.   Physical Exam Updated Vital Signs BP (!) 183/84   Pulse 73  Temp 97.7 F (36.5 C) (Oral)   Resp (!) 23   SpO2 93%  Physical Exam Vitals and nursing note reviewed.  Constitutional:      Comments: Chronically ill-appearing  HENT:     Head: Normocephalic.     Right Ear: Tympanic membrane normal.     Nose: Nose normal.     Mouth/Throat:     Mouth: Mucous membranes are moist.     Pharynx: Oropharynx is clear.  Eyes:     Extraocular Movements: Extraocular movements intact.     Pupils: Pupils are equal, round, and reactive to light.  Cardiovascular:     Rate and Rhythm: Normal rate and regular rhythm.     Pulses: Normal pulses.     Heart sounds: Normal heart sounds.  Pulmonary:      Effort: Pulmonary effort is normal.     Breath sounds: Normal breath sounds.  Abdominal:     General: Abdomen is flat.     Palpations: Abdomen is soft.  Genitourinary:    Comments: No obvious blood from the vagina.  Patient does have dark maroon-colored stool.  No obvious melanotic stool.  No obvious hemorrhoid Musculoskeletal:     Cervical back: Normal range of motion and neck supple.  Skin:    General: Skin is warm.     Capillary Refill: Capillary refill takes less than 2 seconds.  Neurological:     General: No focal deficit present.     Mental Status: She is oriented to person, place, and time.  Psychiatric:        Mood and Affect: Mood normal.        Behavior: Behavior normal.    ED Results / Procedures / Treatments   Labs (all labs ordered are listed, but only abnormal results are displayed) Labs Reviewed  COMPREHENSIVE METABOLIC PANEL WITH GFR - Abnormal; Notable for the following components:      Result Value   Sodium 134 (*)    Chloride 93 (*)    Creatinine, Ser 5.67 (*)    Albumin 2.6 (*)    AST 13 (*)    Total Bilirubin 1.4 (*)    GFR, Estimated 8 (*)    All other components within normal limits  CBC WITH DIFFERENTIAL/PLATELET - Abnormal; Notable for the following components:   RBC 3.01 (*)    Hemoglobin 9.4 (*)    HCT 30.1 (*)    RDW 18.6 (*)    Platelets 71 (*)    All other components within normal limits  POC OCCULT BLOOD, ED - Abnormal; Notable for the following components:   Fecal Occult Bld POSITIVE (*)    All other components within normal limits  URINALYSIS, W/ REFLEX TO CULTURE (INFECTION SUSPECTED)  TYPE AND SCREEN    EKG None  Radiology CT Angio Abd/Pel W and/or Wo Contrast Result Date: 05/19/2023 CLINICAL DATA:  Lower GI bleed EXAM: CTA ABDOMEN AND PELVIS WITHOUT AND WITH CONTRAST TECHNIQUE: Multidetector CT imaging of the abdomen and pelvis was performed using the standard protocol during bolus administration of intravenous contrast.  Multiplanar reconstructed images and MIPs were obtained and reviewed to evaluate the vascular anatomy. RADIATION DOSE REDUCTION: This exam was performed according to the departmental dose-optimization program which includes automated exposure control, adjustment of the mA and/or kV according to patient size and/or use of iterative reconstruction technique. CONTRAST:  75mL OMNIPAQUE IOHEXOL 350 MG/ML SOLN COMPARISON:  04/25/2023 FINDINGS: VASCULAR Aorta: Aortic atherosclerosis.  No aneurysm or dissection. Celiac: Patent without evidence of  aneurysm, dissection, vasculitis or significant stenosis. SMA: Patent without evidence of aneurysm, dissection, vasculitis or significant stenosis. Renals: Small caliber bilaterally. IMA: Patent without evidence of aneurysm, dissection, vasculitis or significant stenosis. Inflow: Atherosclerotic calcifications.  No aneurysm or dissection. Proximal Outflow: Atherosclerotic calcifications. No aneurysm or dissection or significant stenosis. Veins: No obvious venous abnormality within the limitations of this arterial phase study. Review of the MIP images confirms the above findings. NON-VASCULAR Lower chest: Cardiomegaly.  No acute findings. Hepatobiliary: Innumerable cysts throughout the liver, stable. Gallbladder unremarkable. No biliary ductal dilatation. Pancreas: No focal abnormality or ductal dilatation. Spleen: Stable 1.7 cm low-density lesion most compatible with cyst. Adrenals/Urinary Tract: Normal adrenal glands. Enlarged kidneys with innumerable bilateral cysts compatible with polycystic kidney disease, unchanged. No hydronephrosis. Urinary bladder unremarkable. Stomach/Bowel: Stomach, large and small bowel grossly unremarkable. No contrast extravasation to localize GI bleed. Lymphatic: No adenopathy Reproductive: Prior hysterectomy.  No adnexal masses. Other: No free fluid or free air. Musculoskeletal: No acute bony abnormality. IMPRESSION: VASCULAR Aortoiliac  atherosclerosis.  No aneurysm or dissection. No visible contrast extravasation to localize GI bleed. NON-VASCULAR Changes of polycystic kidney disease with enlarged kidneys bilaterally and innumerable hepatic and renal cysts, unchanged. No acute findings. Electronically Signed   By: Janeece Mechanic M.D.   On: 05/19/2023 19:16    Procedures Procedures    Angiocath insertion Performed by: Florette Hurry  Consent: Verbal consent obtained. Risks and benefits: risks, benefits and alternatives were discussed Time out: Immediately prior to procedure a "time out" was called to verify the correct patient, procedure, equipment, support staff and site/side marked as required.  Preparation: Patient was prepped and draped in the usual sterile fashion.  Vein Location: L antecube  Ultrasound Guided  Gauge: 20 long   Normal blood return and flush without difficulty Patient tolerance: Patient tolerated the procedure well with no immediate complications.    Medications Ordered in ED Medications  iohexol (OMNIPAQUE) 350 MG/ML injection 75 mL (75 mLs Intravenous Contrast Given 05/19/23 1714)    ED Course/ Medical Decision Making/ A&P                                 Medical Decision Making RAENAH MURLEY is a 63 y.o. female history of ESRD here presenting with hematuria and also possible rectal bleeding.  Patient usually does not urinate but urinated about 3 times that she described as blood-tinged.  Patient also has blood when she wiped but her tissues.  On rectal exam, patient does have maroon-colored stool.  Concern for possible GI bleed versus hematuria versus UTI.  Plan to get CBC and CMP and CTA of the abdomen pelvis   7:29 PM Hemoglobin dropped to 9.4 from 10.5.  CTA did not show any active bleeding.  I have messaged Dr. Elvin Hammer from GI to see patient.  Also notified Dr. Zelda Hickman from nephrology to dialyze patient tomorrow.  Patient does not need emergent dialysis.  At this point will admit for GI  bleed.  Of note, patient also has greater than 50 red blood cells in the urine as well and bacteria.  Will give empiric antibiotics and urine culture is sent.   Problems Addressed: ESRD (end stage renal disease) on dialysis [facility]): acute illness or injury Gastrointestinal hemorrhage, unspecified gastrointestinal hemorrhage type: acute illness or injury Gross hematuria: acute illness or injury  Amount and/or Complexity of Data Reviewed Labs: ordered. Radiology: ordered and independent interpretation performed. Decision-making  details documented in ED Course.  Risk Prescription drug management. Decision regarding hospitalization.   Final Clinical Impression(s) / ED Diagnoses Final diagnoses:  Gastrointestinal hemorrhage, unspecified gastrointestinal hemorrhage type  ESRD (end stage renal disease) on dialysis [facility])    Rx / DC Orders ED Discharge Orders     None         Dalene Duck, MD 05/19/23 1930    Dalene Duck, MD 05/19/23 2034

## 2023-05-19 NOTE — H&P (Signed)
 History and Physical    Kendra Clay AVW:[id number] DOB: [birth year] DOA: 05/19/2023  PCP: Patient, [name]  Patient coming from: Home  I have personally briefly reviewed patient's old medical records in [facility] Link  Chief Complaint: Blood in stool and hematuria  HPI: Kendra Clay is a 63 y.o. female with medical history significant for CAD s/p PCI LAD in 2017, HFmrEF, ESRD on MWF HD, polycystic liver and kidney disease, anemia of chronic disease, chronic thrombocytopenia, HTN, HLD who presented to the ED for evaluation of blood in her urine and stool.  Patient states that she saw a new blood in her stool earlier today.  She has not had any rectal pain, nausea, vomiting.  She has not seen any dark black stool.  Patient states that she really does not make much urine at baseline due to her dialysis status.  However today she also developed new hematuria with 2-3 episodes which was highly unusual for her.  She says about 1 month ago she was diagnosed with a UTI and treated with antibiotics.  She has not had any pain or burning with urination.  ED Course  Labs/Imaging on admission: I have personally reviewed following labs and imaging studies.  Initial vitals showed BP 174/80, pulse 71, RR 17, temp 97.9 F, SpO2 97% on room air.  Labs showed WBC 4.2, hemoglobin 9.4, platelets 71, sodium 134, potassium 3.7, bicarbonate 29, BUN 16, creatinine 5.67, serum glucose 85.  FOBT is positive.  UA with >50 RBCs and many bacteria, interpretation otherwise limited due to pigmentation.  Urine culture in process.  CTA abdomen/pelvis without visible contrast extravasation to localize GI bleed.  No aneurysm or dissection.  Changes of polycystic kidney disease with enlarged kidneys bilaterally and innumerable hepatic and renal cysts noted and unchanged.  No acute findings.  EDP discussed with on-call GI Dr. Marina Goodell whose team will see in consultation tomorrow.  EDP also discussed with nephrology  Dr. Thedore Mins whose team will formally consult with plan for routine HD tomorrow.  The hospitalist service was consulted to admit.  Review of Systems: All systems reviewed and are negative except as documented in history of present illness above.   Past Medical History:  Diagnosis Date   Complication of anesthesia    Enlarged liver    secondary to PKD   ESRD on hemodialysis (HCC)    MWF [place]   Heart murmur    "slight" per ? Dr Arlyce Dice 30 years ago. 2D ECHO  30 yearsa go.   History of blood transfusion    C- Section   History of bronchitis    numerous, last time> 1 year   Hypertension    Night muscle spasms    legs   Polycystic kidney disease    Genetic dx 23 years ago   PONV (postoperative nausea and vomiting)    patch helped   Scoliosis    Strabismus     Past Surgical History:  Procedure Laterality Date   A/V FISTULAGRAM Right 07/10/2022   Procedure: A/V Fistulagram;  Surgeon: Cephus Shelling, MD;  Location: [facility] INVASIVE CV LAB;  Service: Cardiovascular;  Laterality: Right;   A/V SHUNT INTERVENTION N/A 04/30/2023   Procedure: A/V SHUNT INTERVENTION;  Surgeon: Ethelene Hal, MD;  Location: [facility] INVASIVE CV LAB;  Service: Cardiovascular;  Laterality: N/A;   ABDOMINAL HYSTERECTOMY     AV FISTULA PLACEMENT  09/01/2011   Procedure: ARTERIOVENOUS (AV) FISTULA CREATION;  Surgeon: Larina Earthly, MD;  Location:  [facility] OR;  Service: Vascular;  Laterality: Right;   CESAREAN SECTION  1997   EYE SURGERY     for lazy eye   ORIF FEMUR FRACTURE Left 08/29/2021   Procedure: OPEN REDUCTION INTERNAL FIXATION (ORIF) DISTAL FEMUR FRACTURE;  Surgeon: Roby Lofts, MD;  Location: [facility] OR;  Service: Orthopedics;  Laterality: Left;   ORIF WRIST FRACTURE Left 08/29/2021   Procedure: OPEN REDUCTION INTERNAL FIXATION (ORIF) WRIST FRACTURE;  Surgeon: Roby Lofts, MD;  Location: [facility] OR;  Service: Orthopedics;  Laterality: Left;   PERIPHERAL VASCULAR BALLOON ANGIOPLASTY  07/10/2022   Procedure: PERIPHERAL  VASCULAR BALLOON ANGIOPLASTY;  Surgeon: Cephus Shelling, MD;  Location: [facility] INVASIVE CV LAB;  Service: Cardiovascular;;   TIBIA IM NAIL INSERTION Left 05/19/2018   Procedure: INTRAMEDULLARY (IM) NAIL TIBIAL;  Surgeon: Bjorn Pippin, MD;  Location: [facility] OR;  Service: Orthopedics;  Laterality: Left;   TIBIA IM NAIL INSERTION Right 08/29/2021   Procedure: INTRAMEDULLARY (IM) NAIL TIBIAL WITH IRRIGATION AND DEBRIDMENT;  Surgeon: Roby Lofts, MD;  Location: [facility] OR;  Service: Orthopedics;  Laterality: Right;   VENOUS ANGIOPLASTY Right 04/30/2023   Procedure: VENOUS ANGIOPLASTY;  Surgeon: Ethelene Hal, MD;  Location: [facility] INVASIVE CV LAB;  Service: Cardiovascular;  Laterality: Right;  cephalic arch in stent    Social History: Former smoker, quit 8 years ago.  Allergies  Allergen Reactions   E-Mycin [Erythromycin] Hives   Lisinopril Cough   Sulfa Antibiotics Rash    Skin Rash      Family History  Problem Relation Age of Onset   Arthritis Mother    Kidney disease Father    Polycystic kidney disease Son    Asthma Son    Hypertension Maternal Grandmother      Prior to Admission medications   Medication Sig Start Date End Date Taking? Authorizing Provider  acetaminophen (TYLENOL) 500 MG tablet Take 500 mg by mouth every 8 (eight) hours as needed for moderate pain. No more than 3 a day    [provider]  aspirin EC 81 MG tablet Take 81 mg by mouth at bedtime. Swallow whole.    [provider]  atorvastatin (LIPITOR) 40 MG tablet Take 40 mg by mouth at bedtime.    [provider]  Cinacalcet HCl (SENSIPAR PO) Take 1 tablet by mouth every Monday, Wednesday, and Friday with hemodialysis.    [provider]  Darbepoetin Alfa (ARANESP) 150 MCG/0.3ML SOSY injection Inject 0.3 mLs (150 mcg total) into the vein every Wednesday with hemodialysis. 09/25/21   Setzer, Lynnell Jude, PA-C  doxercalciferol (HECTOROL) 4 MCG/2ML injection Inject 4 mLs (8 mcg total) into the  vein every Monday, Wednesday, and Friday with hemodialysis. 09/25/21   Setzer, Lynnell Jude, PA-C  ferric citrate (AURYXIA) 1 GM 210 MG(Fe) tablet Take 210 mg by mouth 3 (three) times daily with meals.    [provider]  fluticasone (FLONASE) 50 MCG/ACT nasal spray Place 1 spray into both nostrils daily as needed for allergies or rhinitis.    [provider]  isosorbide mononitrate (IMDUR) 30 MG 24 hr tablet Take 0.5 tablets (15 mg total) by mouth daily. Patient taking differently: Take 30 mg by mouth daily. 02/01/23   Burnadette Pop, MD  loperamide (IMODIUM A-D) 2 MG tablet Take 2 mg by mouth daily as needed for diarrhea or loose stools.    [provider]  loratadine (CLARITIN) 10 MG tablet Take 10 mg by mouth daily as needed for allergies.  [provider]  losartan (COZAAR) 25 MG tablet Take 0.5 tablets (12.5 mg total) by mouth daily. Patient taking differently: Take 50 mg by mouth daily. 02/01/23   Burnadette Pop, MD  metoprolol succinate (TOPROL-XL) 25 MG 24 hr tablet Take 25 mg by mouth daily. 03/16/23   [provider]  nitroGLYCERIN (NITROSTAT) 0.4 MG SL tablet Place 0.4 mg under the tongue every 5 (five) minutes as needed for chest pain.    [provider]  pantoprazole (PROTONIX) 40 MG tablet Take 40 mg by mouth 2 (two) times daily. 02/26/20   [provider]    Physical Exam: Vitals:   05/19/23 1347 05/19/23 1822 05/19/23 1822 05/19/23 1945  BP: (!) 174/80 (!) 183/84  (!) 183/83  Pulse: 71 73  72  Resp: 17 (!) 23  (!) 25  Temp: 97.9 F (36.6 C)  97.7 F (36.5 C)   TempSrc: Oral  Oral   SpO2: 97% 93%  96%   Constitutional: Resting bed, NAD, calm, comfortable Eyes: EOMI, lids and conjunctivae normal ENMT: Mucous membranes are moist. Posterior pharynx clear of any exudate or lesions.Normal dentition.  Neck: normal, supple, no masses. Respiratory: clear to auscultation bilaterally, no wheezing, no crackles. Normal  respiratory effort. No accessory muscle use.  Cardiovascular: Regular rate and rhythm, no murmurs / rubs / gallops. No extremity edema. 2+ pedal pulses.  RUE AVF with palpable thrill. Abdomen: no tenderness, no masses palpated. Musculoskeletal: no clubbing / cyanosis. No joint deformity upper and lower extremities. Good ROM, no contractures. Normal muscle tone.  Skin: no rashes, lesions, ulcers. No induration Neurologic: Sensation intact. Strength 5/5 in all 4.  Psychiatric: Normal judgment and insight. Alert and oriented x 3. Normal mood.   EKG: Personally reviewed. Sinus rhythm, rate 74, RBBB and LPFB, PVCs present.  Assessment/Plan Principal Problem:   Acute GI bleeding Active Problems:   Hematuria   ESRD on dialysis [facility])   Coronary artery disease involving native coronary artery of native heart without angina pectoris   Chronic heart failure with mildly reduced ejection fraction (HFmrEF, 41-49%) (HCC)   Hyperlipidemia   Essential hypertension   Anemia of chronic disease   Thrombocytopenia (HCC)   Kendra Clay is a 63 y.o. female with medical history significant for CAD s/p PCI LAD in 2017, HFmrEF, ESRD on MWF HD, polycystic liver and kidney disease, anemia of chronic disease, chronic thrombocytopenia, HTN, HLD who is admitted with acute lower GI bleeding and hematuria.  Assessment and Plan: Acute lower GI bleeding: Patient with new likely lower GI bleeding with red to maroon-colored blood per rectum.  CT angio without visible contrast extravasation to localize GI bleed otherwise no acute changes. - Hold aspirin - N.p.o. after midnight - Repeat CBC in a.m. - GI to consult in a.m.  Hematuria: Patient is oliguric at baseline but has seen new hematuria with several episodes beginning today.  She has known polycystic kidney disease.  CT without evidence of hydronephrosis, urinary bladder unremarkable.  UA shows >50 RBCs with many bacteria.  This may be due to cystitis/UTI. -  Follow urine culture - Start empiric IV ceftriaxone - Obtain renal ultrasound  Anemia of chronic disease: Hemoglobin 9.4 on admission, not far from baseline 9.5-10.0.  Continue to monitor and transfuse as needed.  ESRD on MWF HD: No emergent dialysis needs.  Nephrology aware and planning for routine HD tomorrow.  CAD s/p PCI to LAD 2017: Stable, denies chest pain.  Holding aspirin.  Continue Toprol-XL, Imdur, atorvastatin.  Chronic HFmrEF: TTE 12/16/2022 showed EF 40-45%, moderate concentric LVH.  Appears euvolemic on admission.  Volume managed with dialysis. - Continue Toprol-XL, losartan  Chronic thrombocytopenia: Platelets slightly lower than baseline.  Currently not meeting threshold for transfusion.  Continue to monitor.  Hypertension: Resume losartan, Toprol-XL, Imdur.  Hyperlipidemia: Continue atorvastatin.   DVT prophylaxis: SCDs Start: 05/19/23 2123 Code Status: Full code, confirmed with patient on admission Family Communication: Discussed with patient, she has discussed with family Disposition Plan: From home and likely discharge to home pending clinical progress Consults called: GI and nephrology Severity of Illness: The appropriate patient status for this patient is OBSERVATION. Observation status is judged to be reasonable and necessary in order to provide the required intensity of service to ensure the patient's safety. The patient's presenting symptoms, physical exam findings, and initial radiographic and laboratory data in the context of their medical condition is felt to place them at decreased risk for further clinical deterioration. Furthermore, it is anticipated that the patient will be medically stable for discharge from the hospital within 2 midnights of admission.   Edith Gores MD [facility]  If 7PM-7AM, please contact night-coverage [email or web address]  05/19/2023, 9:35 PM

## 2023-05-19 NOTE — Hospital Course (Signed)
 Kendra Clay is a 63 y.o. female with medical history significant for coronary artery disease status post PCI to LAD,  HFmrEF, ESRD on MWF HD, polycystic liver and kidney disease, anemia of chronic disease, chronic thrombocytopenia, HTN, HLD who presented to the ED for evaluation of blood in her urine and stool.  She does not make much urine at baseline due to dialysis status but had 2-3 episodes of hematuria.  In the ED patient was hypotensive.  Hemoglobin was 9.4.  Creatinine elevated at 5.6.  FOBT was positive.  Urinalysis showed more than 50 RBCs.  CT of the abdomen and pelvis without visible contrast extravasation to localize GI bleed.  No aneurysm or dissection.  Changes of polycystic kidney disease with enlarged kidneys bilaterally and innumerable hepatic and renal cysts noted and unchanged.  No acute findings.  GI and nephrology was consulted and patient was then considered for admission to the hospital.    Assessment and Plan:  Acute lower GI bleeding: Presenting with a red to more red color blood per rectum.  CT angio without visible contrast extravasation to localize GI bleed otherwise no acute changes.  Continue to hold aspirin.  GI has been notified for consultation.   Hematuria: History of polycystic kidney disease.  On dialysis. UA shows >50 RBCs with many bacteria.  This may be due to cystitis/UTI. Follow urine cultures.  Continue Rocephin.  Renal ultrasound shows polycystic kidney disease with echogenic focus in the urinary bladder likely representing thrombus.   Anemia of chronic disease: Hemoglobin 9.4 on admission, hemoglobin today at 9.0.  baseline 9.5-10.0.  Continue to monitor and transfuse as needed.   ESRD on MWF HD: No emergent dialysis needs.  Nephrology on board for hemodialysis needs.   CAD s/p PCI to LAD 2017: Stable, denies chest pain.  Holding aspirin.  Continue Toprol-XL, Imdur, atorvastatin.   Chronic HFmrEF: TTE 12/16/2022 showed EF 40-45%, moderate  concentric LVH.  Compensated at this time.  Volume managed by dialysis.  Continue Toprol-XL, losartan   Chronic thrombocytopenia: Mild thrombocytopenia at 61K.  Continue to monitor.   Hypertension: Resume losartan, Toprol-XL, Imdur.   Hyperlipidemia: Continue atorvastatin.

## 2023-05-20 DIAGNOSIS — R310 Gross hematuria: Secondary | ICD-10-CM | POA: Diagnosis present

## 2023-05-20 DIAGNOSIS — K922 Gastrointestinal hemorrhage, unspecified: Secondary | ICD-10-CM | POA: Diagnosis present

## 2023-05-20 DIAGNOSIS — I132 Hypertensive heart and chronic kidney disease with heart failure and with stage 5 chronic kidney disease, or end stage renal disease: Secondary | ICD-10-CM | POA: Diagnosis present

## 2023-05-20 DIAGNOSIS — N2581 Secondary hyperparathyroidism of renal origin: Secondary | ICD-10-CM | POA: Diagnosis present

## 2023-05-20 DIAGNOSIS — D696 Thrombocytopenia, unspecified: Secondary | ICD-10-CM | POA: Diagnosis present

## 2023-05-20 DIAGNOSIS — R195 Other fecal abnormalities: Secondary | ICD-10-CM

## 2023-05-20 DIAGNOSIS — K648 Other hemorrhoids: Secondary | ICD-10-CM | POA: Diagnosis present

## 2023-05-20 DIAGNOSIS — K5521 Angiodysplasia of colon with hemorrhage: Secondary | ICD-10-CM | POA: Diagnosis present

## 2023-05-20 DIAGNOSIS — E785 Hyperlipidemia, unspecified: Secondary | ICD-10-CM | POA: Diagnosis present

## 2023-05-20 DIAGNOSIS — Z8249 Family history of ischemic heart disease and other diseases of the circulatory system: Secondary | ICD-10-CM | POA: Diagnosis not present

## 2023-05-20 DIAGNOSIS — D631 Anemia in chronic kidney disease: Secondary | ICD-10-CM | POA: Diagnosis present

## 2023-05-20 DIAGNOSIS — D638 Anemia in other chronic diseases classified elsewhere: Secondary | ICD-10-CM

## 2023-05-20 DIAGNOSIS — K5520 Angiodysplasia of colon without hemorrhage: Secondary | ICD-10-CM | POA: Diagnosis not present

## 2023-05-20 DIAGNOSIS — Z992 Dependence on renal dialysis: Secondary | ICD-10-CM

## 2023-05-20 DIAGNOSIS — Z841 Family history of disorders of kidney and ureter: Secondary | ICD-10-CM | POA: Diagnosis not present

## 2023-05-20 DIAGNOSIS — Z888 Allergy status to other drugs, medicaments and biological substances status: Secondary | ICD-10-CM | POA: Diagnosis not present

## 2023-05-20 DIAGNOSIS — Z7982 Long term (current) use of aspirin: Secondary | ICD-10-CM | POA: Diagnosis not present

## 2023-05-20 DIAGNOSIS — I5042 Chronic combined systolic (congestive) and diastolic (congestive) heart failure: Secondary | ICD-10-CM | POA: Diagnosis present

## 2023-05-20 DIAGNOSIS — I2510 Atherosclerotic heart disease of native coronary artery without angina pectoris: Secondary | ICD-10-CM | POA: Diagnosis present

## 2023-05-20 DIAGNOSIS — Z882 Allergy status to sulfonamides status: Secondary | ICD-10-CM | POA: Diagnosis not present

## 2023-05-20 DIAGNOSIS — Z79899 Other long term (current) drug therapy: Secondary | ICD-10-CM | POA: Diagnosis not present

## 2023-05-20 DIAGNOSIS — N186 End stage renal disease: Secondary | ICD-10-CM | POA: Diagnosis present

## 2023-05-20 DIAGNOSIS — Q613 Polycystic kidney, unspecified: Secondary | ICD-10-CM | POA: Diagnosis not present

## 2023-05-20 DIAGNOSIS — R319 Hematuria, unspecified: Secondary | ICD-10-CM

## 2023-05-20 DIAGNOSIS — Z87891 Personal history of nicotine dependence: Secondary | ICD-10-CM | POA: Diagnosis not present

## 2023-05-20 DIAGNOSIS — Z881 Allergy status to other antibiotic agents status: Secondary | ICD-10-CM | POA: Diagnosis not present

## 2023-05-20 DIAGNOSIS — E538 Deficiency of other specified B group vitamins: Secondary | ICD-10-CM | POA: Diagnosis present

## 2023-05-20 DIAGNOSIS — E871 Hypo-osmolality and hyponatremia: Secondary | ICD-10-CM | POA: Diagnosis present

## 2023-05-20 DIAGNOSIS — N3289 Other specified disorders of bladder: Secondary | ICD-10-CM | POA: Diagnosis present

## 2023-05-20 LAB — CBC
HCT: 28.5 % — ABNORMAL LOW (ref 36.0–46.0)
HCT: 27.2 % — ABNORMAL LOW (ref 36.0–46.0)
Hemoglobin: 9 g/dL — ABNORMAL LOW (ref 12.0–15.0)
Hemoglobin: 8.8 g/dL — ABNORMAL LOW (ref 12.0–15.0)
MCH: 30.9 pg (ref 26.0–34.0)
MCH: 31.9 pg (ref 26.0–34.0)
MCHC: 31.6 g/dL (ref 30.0–36.0)
MCHC: 32.4 g/dL (ref 30.0–36.0)
MCV: 97.9 fL (ref 80.0–100.0)
MCV: 98.6 fL (ref 80.0–100.0)
Platelets: 61 10*3/uL — ABNORMAL LOW (ref 150–400)
Platelets: 57 10*3/uL — ABNORMAL LOW (ref 150–400)
RBC: 2.91 MIL/uL — ABNORMAL LOW (ref 3.87–5.11)
RBC: 2.76 MIL/uL — ABNORMAL LOW (ref 3.87–5.11)
RDW: 18.6 % — ABNORMAL HIGH (ref 11.5–15.5)
RDW: 18.6 % — ABNORMAL HIGH (ref 11.5–15.5)
WBC: 3 10*3/uL — ABNORMAL LOW (ref 4.0–10.5)
WBC: 4 10*3/uL (ref 4.0–10.5)
nRBC: 0 % (ref 0.0–0.2)
nRBC: 0 % (ref 0.0–0.2)

## 2023-05-20 LAB — BASIC METABOLIC PANEL WITH GFR
Anion gap: 13 (ref 5–15)
BUN: 22 mg/dL (ref 8–23)
CO2: 28 mmol/L (ref 22–32)
Calcium: 8.9 mg/dL (ref 8.9–10.3)
Chloride: 92 mmol/L — ABNORMAL LOW (ref 98–111)
Creatinine, Ser: 6.68 mg/dL — ABNORMAL HIGH (ref 0.44–1.00)
GFR, Estimated: 7 mL/min — ABNORMAL LOW (ref >60)
Glucose, Bld: 82 mg/dL (ref 70–99)
Potassium: 4.2 mmol/L (ref 3.5–5.1)
Sodium: 133 mmol/L — ABNORMAL LOW (ref 135–145)

## 2023-05-20 LAB — MRSA NEXT GEN BY PCR, NASAL: MRSA by PCR Next Gen: NOT DETECTED

## 2023-05-20 MED ORDER — SIMETHICONE 80 MG PO CHEW
240.0000 mg | CHEWABLE_TABLET | Freq: Once | ORAL | Status: AC
  Administered 2023-05-21: 240 mg via ORAL
  Filled 2023-05-20 (×2): qty 3

## 2023-05-20 MED ORDER — SIMETHICONE 80 MG PO CHEW
240.0000 mg | CHEWABLE_TABLET | Freq: Once | ORAL | Status: AC
  Administered 2023-05-20: 240 mg via ORAL

## 2023-05-20 MED ORDER — NA SULFATE-K SULFATE-MG SULF 17.5-3.13-1.6 GM/177ML PO SOLN
0.5000 | Freq: Once | ORAL | Status: AC
  Administered 2023-05-20: 177 mL via ORAL
  Filled 2023-05-20: qty 1

## 2023-05-20 MED ORDER — NA SULFATE-K SULFATE-MG SULF 17.5-3.13-1.6 GM/177ML PO SOLN
0.5000 | Freq: Once | ORAL | Status: AC
  Administered 2023-05-21: 177 mL via ORAL

## 2023-05-20 NOTE — Plan of Care (Signed)
   Problem: Education: Goal: Knowledge of General Education information will improve Description: Including pain rating scale, medication(s)/side effects and non-pharmacologic comfort measures Outcome: Progressing   Problem: Clinical Measurements: Goal: Respiratory complications will improve Outcome: Progressing   Problem: Pain Managment: Goal: General experience of comfort will improve and/or be controlled Outcome: Progressing

## 2023-05-20 NOTE — H&P (View-Only) (Signed)
 Consultation Note   Referring Provider:  [facility] PCP: Patient, [name] Primary Gastroenterologist::  GI in [place]         Reason for Consultation:  BI bleed DOA: 05/19/2023         Hospital Day: 2   ASSESSMENT    63 y.o. year old female admitted with concern for GI bleed  Reported blood in stool / FOBT+  Picture is overall confusing.  She has red blood in her underpants but has not had a bowel movement today.  She reports having gross hematuria in setting of a recent UTI ( treated with antibiotics). Urine culture pending.  On DRE there was no blood and there was a small amount of light brown-colored stool which is confusing because in the ED the DRE showed maroon blood in stool. Cannot exclude a vaginal source as there was residual blood in vagina on exam.  She has had a hysterectomy and there were no GYN abnormalities on the CT angio  Slight worsening of chronic disease anemia and setting of  ? GI bleed and hematuria  Hemoglobin 9.4 on admission (close to baseline), down to 9.0   Chronic thrombocytopenia No reported splenomegaly on CT scan  Platelets 61 which is slightly worse than her baseline of low 100s  ESRD on HD.  Recent UTI treated with antibiotics.  Reports hematuria.  Culture pending.  On Rocephin  CAD status post PCI 2017, HFmrEF  See PMH for additional history    PLAN:   --Monitor H/H --Continue pantoprazole twice daily --Clear liquids today.  -- May need colonoscopy this admission but again the source of bleeding is confusing given the hematura and light brown stool without blood on my exam today ( though DRE by other provider was remarkable for maroon stool      HPI   Patient presented  to the ED yesterday for evaluation of blood in stool  red blood when wiping) and  also hematuria.  She describes abdominal pain which has not changed or gotten any worse with the bleeding.  No rectal pain..  No  nausea/vomiting.   When she had a bowel movement yesterday there was bright red blood when she wiped.but she isn't sure if blood was due to hematuria.   She has not had any bowel movements today yet there is blood in her underpants. She has had a hysterectomy. No GYN abnormalities on CT angio. Prentice Brochure takes a daily baby aspirin, no other NSAIDs..  She has a history of GERD, symptoms controlled on twice daily PPI  In ED "  VSS. Hgb slightly lower than baseline.  BUN 22, creatinine 6.68.   CT angio was done and negative for active GI bleeding.   .  Previous GI Studies   [place] were both 9 to 10 years ago.  She says that her colonoscopy is due in 1 year  Labs and Imaging:  Recent Labs    05/19/23 1540 05/20/23 0453  WBC 4.2 3.0*  HGB 9.4* 9.0*  HCT 30.1* 28.5*  MCV 100.0 97.9  PLT 71* 61*   No results for input(s): "FOLATE", "VITAMINB12", "FERRITIN", "TIBC", "IRONPCTSAT" in  the last 72 hours. Recent Labs    05/19/23 1540 05/20/23 0453  NA 134* 133*  K 3.7 4.2  CL 93* 92*  CO2 29 28  GLUCOSE 85 82  BUN 16 22  CREATININE 5.67* 6.68*  CALCIUM 9.4 8.9   Recent Labs    05/19/23 1540  PROT 7.2  ALBUMIN 2.6*  AST 13*  ALT 7  ALKPHOS 112  BILITOT 1.4*    US RENAL CLINICAL DATA:  Hematuria  EXAM: RENAL / URINARY TRACT ULTRASOUND COMPLETE  COMPARISON:  None Available.  FINDINGS: Right Kidney:  Renal measurements: 19.1 x 10.2 x 11.6 cm. = volume: 1181 mL. Innumerable cysts are noted throughout the right kidney consistent with the previous findings of CT examination. No mass lesion is noted.  Left Kidney:  Renal measurements: 17.9 x 9.6 x 13.7 cm. = volume: 1233 mL. Innumerable cysts are noted similar to that seen on the right. No obstructive changes are noted.  Bladder:  Bladder is well distended. Echogenic focus is noted measuring 2.1 x 1.0 x 1.9 cm. Given the patient's  clinical history of hematuria this likely represents thrombus. It was not well appreciated on the recent CT.  Other:  None.  IMPRESSION: Changes consistent with polycystic kidney disease.  Echogenic focus within the urinary bladder likely representing thrombus.  Electronically Signed   By: Alcide Clever M.D.   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 Dr Arlyce Dice 30 years ago. 2D ECHO  30 yearsa go.   History of blood transfusion    C- Section   History of bronchitis    numerous, last time> 1 year   Hypertension    Night muscle spasms    legs   Polycystic kidney disease    Genetic dx 23 years ago   PONV (postoperative nausea and vomiting)    patch helped   Scoliosis    Strabismus     Past Surgical History:  Procedure Laterality Date   A/V FISTULAGRAM Right 07/10/2022   Procedure: A/V Fistulagram;  Surgeon: Cephus Shelling, MD;  Location: [facility] INVASIVE CV LAB;  Service: Cardiovascular;  Laterality: Right;   A/V SHUNT INTERVENTION N/A 04/30/2023   Procedure: A/V SHUNT INTERVENTION;  Surgeon: Ethelene Hal, MD;  Location: [facility] INVASIVE CV LAB;  Service: Cardiovascular;  Laterality: N/A;   ABDOMINAL HYSTERECTOMY     AV FISTULA PLACEMENT  09/01/2011   Procedure: ARTERIOVENOUS (AV) FISTULA CREATION;  Surgeon: Larina Earthly, MD;  Location: [facility] OR;  Service: Vascular;  Laterality: Right;   CESAREAN SECTION  1997   EYE SURGERY     for lazy eye   ORIF FEMUR FRACTURE Left 08/29/2021   Procedure: OPEN REDUCTION INTERNAL FIXATION (ORIF) DISTAL FEMUR FRACTURE;  Surgeon: Roby Lofts, MD;  Location: [facility] OR;  Service: Orthopedics;  Laterality: Left;   ORIF WRIST FRACTURE Left 08/29/2021   Procedure: OPEN REDUCTION INTERNAL FIXATION (ORIF) WRIST FRACTURE;  Surgeon: Roby Lofts, MD;  Location: [facility] OR;  Service:  Orthopedics;  Laterality: Left;   PERIPHERAL VASCULAR BALLOON ANGIOPLASTY  07/10/2022   Procedure: PERIPHERAL VASCULAR BALLOON ANGIOPLASTY;  Surgeon: Cephus Shelling, MD;  Location: [facility];  Service: Cardiovascular;;   TIBIA IM NAIL INSERTION Left 05/19/2018   Procedure: INTRAMEDULLARY (  IM) NAIL TIBIAL;  Surgeon: Bjorn Pippin, MD;  Location: [facility] OR;  Service: Orthopedics;  Laterality: Left;   TIBIA IM NAIL INSERTION Right 08/29/2021   Procedure: INTRAMEDULLARY (IM) NAIL TIBIAL WITH IRRIGATION AND DEBRIDMENT;  Surgeon: Roby Lofts, MD;  Location: [facility] OR;  Service: Orthopedics;  Laterality: Right;   VENOUS ANGIOPLASTY Right 04/30/2023   Procedure: VENOUS ANGIOPLASTY;  Surgeon: Ethelene Hal, MD;  Location: [facility] INVASIVE CV LAB;  Service: Cardiovascular;  Laterality: Right;  cephalic arch in stent    Family History  Problem Relation Age of Onset   Arthritis Mother    Kidney disease Father    Polycystic kidney disease Son    Asthma Son    Hypertension Maternal Grandmother     Prior to Admission medications   Medication Sig Start Date End Date Taking? Authorizing Provider  acetaminophen (TYLENOL) 500 MG tablet Take 500 mg by mouth every 8 (eight) hours as needed for moderate pain. No more than 3 a day   Yes [provider]  aspirin EC 81 MG tablet Take 81 mg by mouth at bedtime. Swallow whole.   Yes [provider]  atorvastatin (LIPITOR) 40 MG tablet Take 40 mg by mouth at bedtime.   Yes [provider]  Cinacalcet HCl (SENSIPAR PO) Take 1 tablet by mouth every Monday, Wednesday, and Friday with hemodialysis.   Yes [provider]  Darbepoetin Alfa (ARANESP) 150 MCG/0.3ML SOSY injection Inject 0.3 mLs (150 mcg total) into the vein every Wednesday with hemodialysis. 09/25/21  Yes Setzer, Lynnell Jude, PA-C  doxercalciferol (HECTOROL) 4 MCG/2ML injection Inject 4 mLs (8 mcg total) into the vein every Monday, Wednesday, and Friday with hemodialysis.  09/25/21  Yes Setzer, Lynnell Jude, PA-C  ferric citrate (AURYXIA) 1 GM 210 MG(Fe) tablet Take 210 mg by mouth 3 (three) times daily with meals.   Yes [provider]  fluticasone (FLONASE) 50 MCG/ACT nasal spray Place 1 spray into both nostrils daily as needed for allergies or rhinitis.   Yes [provider]  isosorbide mononitrate (IMDUR) 30 MG 24 hr tablet Take 0.5 tablets (15 mg total) by mouth daily. Patient taking differently: Take 30 mg by mouth daily. 02/01/23  Yes Adhikari, Willia Craze, MD  lidocaine (LIDODERM) 5 % Place 1 patch onto the skin daily. Remove & Discard patch within 12 hours or as directed by MD   Yes [provider]  loperamide (IMODIUM A-D) 2 MG tablet Take 2 mg by mouth daily as needed for diarrhea or loose stools.   Yes [provider]  loratadine (CLARITIN) 10 MG tablet Take 10 mg by mouth daily as needed for allergies.   Yes [provider]  losartan (COZAAR) 25 MG tablet Take 0.5 tablets (12.5 mg total) by mouth daily. Patient taking differently: Take 50 mg by mouth daily. 02/01/23  Yes Burnadette Pop, MD  metoprolol succinate (TOPROL-XL) 25 MG 24 hr tablet Take 25 mg by mouth daily. 03/16/23  Yes [provider]  nitroGLYCERIN (NITROSTAT) 0.4 MG SL tablet Place 0.4 mg under the tongue every 5 (five) minutes as needed for chest pain.   Yes [provider]  pantoprazole (PROTONIX) 40 MG tablet Take 40 mg by mouth 2 (two) times daily. 02/26/20  Yes [provider]    Current Facility-Administered Medications  Medication Dose Route Frequency Provider Last Rate Last Admin   acetaminophen (TYLENOL) tablet 650 mg  650 mg Oral Q6H PRN Charlsie Quest, MD  Or   acetaminophen (TYLENOL) suppository 650 mg  650 mg Rectal Q6H PRN Charlsie Quest, MD       atorvastatin (LIPITOR) tablet 40 mg  40 mg Oral QHS Darreld Mclean R, MD   40 mg at 05/19/23 2159   cefTRIAXone (ROCEPHIN) 1 g in sodium chloride 0.9 % 100 mL IVPB   1 g Intravenous Q24H Charlsie Quest, MD   Stopped at 05/19/23 2226   Chlorhexidine Gluconate Cloth 2 % PADS 6 each  6 each Topical Q0600 Anthony Sar, MD       cinacalcet (SENSIPAR) tablet 30 mg  30 mg Oral Q M,W,F-HD Anthony Sar, MD       doxercalciferol (HECTOROL) injection 5 mcg  5 mcg Intravenous Q M,W,F-HD Anthony Sar, MD       isosorbide mononitrate (IMDUR) 24 hr tablet 30 mg  30 mg Oral Daily Darreld Mclean R, MD   30 mg at 05/19/23 2159   losartan (COZAAR) tablet 50 mg  50 mg Oral Daily Darreld Mclean R, MD   50 mg at 05/19/23 2200   metoprolol succinate (TOPROL-XL) 24 hr tablet 25 mg  25 mg Oral Daily Charlsie Quest, MD       ondansetron (ZOFRAN) tablet 4 mg  4 mg Oral Q6H PRN Charlsie Quest, MD       Or   ondansetron (ZOFRAN) injection 4 mg  4 mg Intravenous Q6H PRN Charlsie Quest, MD       pantoprazole (PROTONIX) EC tablet 40 mg  40 mg Oral BID Darreld Mclean R, MD   40 mg at 05/19/23 2159   senna-docusate (Senokot-S) tablet 1 tablet  1 tablet Oral QHS PRN Charlsie Quest, MD       sodium chloride flush (NS) 0.9 % injection 3 mL  3 mL Intravenous Q12H Darreld Mclean R, MD   3 mL at 05/19/23 2125    Allergies as of 05/19/2023 - Review Complete 05/19/2023  Allergen Reaction Noted   E-mycin [erythromycin] Hives    Lisinopril Cough 05/05/2018   Sulfa antibiotics Rash 06/25/2011    Social History   Socioeconomic History   Marital status: Widowed    Spouse name: Not on file   Number of children: Not on file   Years of education: Not on file   Highest education level: Not on file  Occupational History   Not on file  Tobacco Use   Smoking status: Former    Current packs/day: 0.00    Average packs/day: 1.5 packs/day for 34.9 years (52.4 ttl pk-yrs)    Types: Cigarettes    Start date: 63    Quit date: 01/10/2015    Years since quitting: 8.3   Smokeless tobacco: Former    Quit date: 01/09/2014  Vaping Use   Vaping status: Never Used  Substance and Sexual Activity    Alcohol use: No    Alcohol/week: 0.0 standard drinks of alcohol   Drug use: No   Sexual activity: Not on file  Other Topics Concern   Not on file  Social History Narrative   Not on file   Social Drivers of Health   Financial Resource Strain: Not on file  Food Insecurity: No Food Insecurity (05/20/2023)   Hunger Vital Sign    Worried About Running Out of Food in the Last Year: Never true    Ran Out of Food in the Last Year: Never true  Transportation Needs: No Transportation Needs (05/20/2023)   PRAPARE - Transportation  Lack of Transportation (Medical): No    Lack of Transportation (Non-Medical): No  Physical Activity: Not on file  Stress: Not on file  Social Connections: Not on file  Intimate Partner Violence: Not At Risk (05/20/2023)   Humiliation, Afraid, Rape, and Kick questionnaire    Fear of Current or Ex-Partner: No    Emotionally Abused: No    Physically Abused: No    Sexually Abused: No     Code Status   Code Status: Full Code  Review of Systems: All systems reviewed and negative except where noted in HPI.  Physical Exam: Vital signs in last 24 hours: Temp:  [97.7 F (36.5 C)-98.6 F (37 C)] 98 F (36.7 C) (04/16 0701) Pulse Rate:  [70-76] 70 (04/16 0701) Resp:  [17-25] 17 (04/16 0701) BP: (140-183)/(78-101) 169/80 (04/16 0701) SpO2:  [92 %-100 %] 93 % (04/16 0701) Weight:  [54 kg] (P) 54 kg (04/16 0328) Last BM Date : 05/19/23  General:  Pleasant female in NAD Psych:  Cooperative. Normal mood and affect Eyes: Pupils equal Ears:  Normal auditory acuity Nose: No deformity, discharge or lesions Neck:  Supple, no masses felt Lungs:  Clear to auscultation.  Heart:  Regular rate, regular rhythm.  Abdomen:  Soft, nondistended, nontender, active bowel sounds, no masses felt Rectal : No blood in the vault on DRE .  Small amount of soft light brown stool present  Msk: Symmetrical without gross deformities.  Neurologic:  Alert, oriented, grossly normal  neurologically Extremities : No edema Skin:  Intact without significant lesions.    Intake/Output from previous day: 04/15 0701 - 04/16 0700 In: 340 [P.O.:240; IV Piggyback:100] Out: 0  Intake/Output this shift:  No intake/output data recorded.   Mai Schwalbe, NP-C   05/20/2023, 9:02 AM

## 2023-05-20 NOTE — Consult Note (Signed)
 Consultation Note   Referring Provider:  [facility] PCP: Patient, [name] Primary Gastroenterologist::  GI in [place]         Reason for Consultation:  BI bleed DOA: 05/19/2023         Hospital Day: 2   ASSESSMENT    63 y.o. year old female admitted with concern for GI bleed  Reported blood in stool / FOBT+  Picture is overall confusing.  She has red blood in her underpants but has not had a bowel movement today.  She reports having gross hematuria in setting of a recent UTI ( treated with antibiotics). Urine culture pending.  On DRE there was no blood and there was a small amount of light brown-colored stool which is confusing because in the ED the DRE showed maroon blood in stool. Cannot exclude a vaginal source as there was residual blood in vagina on exam.  She has had a hysterectomy and there were no GYN abnormalities on the CT angio  Slight worsening of chronic disease anemia and setting of  ? GI bleed and hematuria  Hemoglobin 9.4 on admission (close to baseline), down to 9.0   Chronic thrombocytopenia No reported splenomegaly on CT scan  Platelets 61 which is slightly worse than her baseline of low 100s  ESRD on HD.  Recent UTI treated with antibiotics.  Reports hematuria.  Culture pending.  On Rocephin  CAD status post PCI 2017, HFmrEF  See PMH for additional history    PLAN:   --Monitor H/H --Continue pantoprazole twice daily --Clear liquids today.  -- May need colonoscopy this admission but again the source of bleeding is confusing given the hematura and light brown stool without blood on my exam today ( though DRE by other provider was remarkable for maroon stool      HPI   Patient presented  to the ED yesterday for evaluation of blood in stool  red blood when wiping) and  also hematuria.  She describes abdominal pain which has not changed or gotten any worse with the bleeding.  No rectal pain..  No  nausea/vomiting.   When she had a bowel movement yesterday there was bright red blood when she wiped.but she isn't sure if blood was due to hematuria.   She has not had any bowel movements today yet there is blood in her underpants. She has had a hysterectomy. No GYN abnormalities on CT angio. Shawna Orleans takes a daily baby aspirin, no other NSAIDs..  She has a history of GERD, symptoms controlled on twice daily PPI  In ED "  VSS. Hgb slightly lower than baseline.  BUN 22, creatinine 6.68.   CT angio was done and negative for active GI bleeding.   .  Previous GI Studies   [place] were both 9 to 10 years ago.  She says that her colonoscopy is due in 1 year  Labs and Imaging:  Recent Labs    05/19/23 1540 05/20/23 0453  WBC 4.2 3.0*  HGB 9.4* 9.0*  HCT 30.1* 28.5*  MCV 100.0 97.9  PLT 71* 61*   No results for input(s): "FOLATE", "VITAMINB12", "FERRITIN", "TIBC", "IRONPCTSAT" in  the last 72 hours. Recent Labs    05/19/23 1540 05/20/23 0453  NA 134* 133*  K 3.7 4.2  CL 93* 92*  CO2 29 28  GLUCOSE 85 82  BUN 16 22  CREATININE 5.67* 6.68*  CALCIUM 9.4 8.9   Recent Labs    05/19/23 1540  PROT 7.2  ALBUMIN 2.6*  AST 13*  ALT 7  ALKPHOS 112  BILITOT 1.4*    US RENAL CLINICAL DATA:  Hematuria  EXAM: RENAL / URINARY TRACT ULTRASOUND COMPLETE  COMPARISON:  None Available.  FINDINGS: Right Kidney:  Renal measurements: 19.1 x 10.2 x 11.6 cm. = volume: 1181 mL. Innumerable cysts are noted throughout the right kidney consistent with the previous findings of CT examination. No mass lesion is noted.  Left Kidney:  Renal measurements: 17.9 x 9.6 x 13.7 cm. = volume: 1233 mL. Innumerable cysts are noted similar to that seen on the right. No obstructive changes are noted.  Bladder:  Bladder is well distended. Echogenic focus is noted measuring 2.1 x 1.0 x 1.9 cm. Given the patient's  clinical history of hematuria this likely represents thrombus. It was not well appreciated on the recent CT.  Other:  None.  IMPRESSION: Changes consistent with polycystic kidney disease.  Echogenic focus within the urinary bladder likely representing thrombus.  Electronically Signed   By: Alcide Clever M.D.   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 Dr Arlyce Dice 30 years ago. 2D ECHO  30 yearsa go.   History of blood transfusion    C- Section   History of bronchitis    numerous, last time> 1 year   Hypertension    Night muscle spasms    legs   Polycystic kidney disease    Genetic dx 23 years ago   PONV (postoperative nausea and vomiting)    patch helped   Scoliosis    Strabismus     Past Surgical History:  Procedure Laterality Date   A/V FISTULAGRAM Right 07/10/2022   Procedure: A/V Fistulagram;  Surgeon: Cephus Shelling, MD;  Location: [facility] INVASIVE CV LAB;  Service: Cardiovascular;  Laterality: Right;   A/V SHUNT INTERVENTION N/A 04/30/2023   Procedure: A/V SHUNT INTERVENTION;  Surgeon: Ethelene Hal, MD;  Location: [facility] INVASIVE CV LAB;  Service: Cardiovascular;  Laterality: N/A;   ABDOMINAL HYSTERECTOMY     AV FISTULA PLACEMENT  09/01/2011   Procedure: ARTERIOVENOUS (AV) FISTULA CREATION;  Surgeon: Larina Earthly, MD;  Location: [facility] OR;  Service: Vascular;  Laterality: Right;   CESAREAN SECTION  1997   EYE SURGERY     for lazy eye   ORIF FEMUR FRACTURE Left 08/29/2021   Procedure: OPEN REDUCTION INTERNAL FIXATION (ORIF) DISTAL FEMUR FRACTURE;  Surgeon: Roby Lofts, MD;  Location: [facility] OR;  Service: Orthopedics;  Laterality: Left;   ORIF WRIST FRACTURE Left 08/29/2021   Procedure: OPEN REDUCTION INTERNAL FIXATION (ORIF) WRIST FRACTURE;  Surgeon: Roby Lofts, MD;  Location: [facility] OR;  Service:  Orthopedics;  Laterality: Left;   PERIPHERAL VASCULAR BALLOON ANGIOPLASTY  07/10/2022   Procedure: PERIPHERAL VASCULAR BALLOON ANGIOPLASTY;  Surgeon: Cephus Shelling, MD;  [facility]: [facility];  Service: Cardiovascular;;   TIBIA IM NAIL INSERTION Left 05/19/2018   Procedure: INTRAMEDULLARY (  IM) NAIL TIBIAL;  Surgeon: Bjorn Pippin, MD;  Location: [facility] OR;  Service: Orthopedics;  Laterality: Left;   TIBIA IM NAIL INSERTION Right 08/29/2021   Procedure: INTRAMEDULLARY (IM) NAIL TIBIAL WITH IRRIGATION AND DEBRIDMENT;  Surgeon: Roby Lofts, MD;  Location: [facility] OR;  Service: Orthopedics;  Laterality: Right;   VENOUS ANGIOPLASTY Right 04/30/2023   Procedure: VENOUS ANGIOPLASTY;  Surgeon: Ethelene Hal, MD;  Location: [facility] INVASIVE CV LAB;  Service: Cardiovascular;  Laterality: Right;  cephalic arch in stent    Family History  Problem Relation Age of Onset   Arthritis Mother    Kidney disease Father    Polycystic kidney disease Son    Asthma Son    Hypertension Maternal Grandmother     Prior to Admission medications   Medication Sig Start Date End Date Taking? Authorizing Provider  acetaminophen (TYLENOL) 500 MG tablet Take 500 mg by mouth every 8 (eight) hours as needed for moderate pain. No more than 3 a day   Yes [provider]  aspirin EC 81 MG tablet Take 81 mg by mouth at bedtime. Swallow whole.   Yes [provider]  atorvastatin (LIPITOR) 40 MG tablet Take 40 mg by mouth at bedtime.   Yes [provider]  Cinacalcet HCl (SENSIPAR PO) Take 1 tablet by mouth every Monday, Wednesday, and Friday with hemodialysis.   Yes [provider]  Darbepoetin Alfa (ARANESP) 150 MCG/0.3ML SOSY injection Inject 0.3 mLs (150 mcg total) into the vein every Wednesday with hemodialysis. 09/25/21  Yes Setzer, Lynnell Jude, PA-C  doxercalciferol (HECTOROL) 4 MCG/2ML injection Inject 4 mLs (8 mcg total) into the vein every Monday, Wednesday, and Friday with hemodialysis.  09/25/21  Yes Setzer, Lynnell Jude, PA-C  ferric citrate (AURYXIA) 1 GM 210 MG(Fe) tablet Take 210 mg by mouth 3 (three) times daily with meals.   Yes [provider]  fluticasone (FLONASE) 50 MCG/ACT nasal spray Place 1 spray into both nostrils daily as needed for allergies or rhinitis.   Yes [provider]  isosorbide mononitrate (IMDUR) 30 MG 24 hr tablet Take 0.5 tablets (15 mg total) by mouth daily. Patient taking differently: Take 30 mg by mouth daily. 02/01/23  Yes Adhikari, Willia Craze, MD  lidocaine (LIDODERM) 5 % Place 1 patch onto the skin daily. Remove & Discard patch within 12 hours or as directed by MD   Yes [provider]  loperamide (IMODIUM A-D) 2 MG tablet Take 2 mg by mouth daily as needed for diarrhea or loose stools.   Yes [provider]  loratadine (CLARITIN) 10 MG tablet Take 10 mg by mouth daily as needed for allergies.   Yes [provider]  losartan (COZAAR) 25 MG tablet Take 0.5 tablets (12.5 mg total) by mouth daily. Patient taking differently: Take 50 mg by mouth daily. 02/01/23  Yes Burnadette Pop, MD  metoprolol succinate (TOPROL-XL) 25 MG 24 hr tablet Take 25 mg by mouth daily. 03/16/23  Yes [provider]  nitroGLYCERIN (NITROSTAT) 0.4 MG SL tablet Place 0.4 mg under the tongue every 5 (five) minutes as needed for chest pain.   Yes [provider]  pantoprazole (PROTONIX) 40 MG tablet Take 40 mg by mouth 2 (two) times daily. 02/26/20  Yes [provider]    Current Facility-Administered Medications  Medication Dose Route Frequency Provider Last Rate Last Admin   acetaminophen (TYLENOL) tablet 650 mg  650 mg Oral Q6H PRN Charlsie Quest, MD  Or   acetaminophen (TYLENOL) suppository 650 mg  650 mg Rectal Q6H PRN Charlsie Quest, MD       atorvastatin (LIPITOR) tablet 40 mg  40 mg Oral QHS Darreld Mclean R, MD   40 mg at 05/19/23 2159   cefTRIAXone (ROCEPHIN) 1 g in sodium chloride 0.9 % 100 mL IVPB   1 g Intravenous Q24H Charlsie Quest, MD   Stopped at 05/19/23 2226   Chlorhexidine Gluconate Cloth 2 % PADS 6 each  6 each Topical Q0600 Anthony Sar, MD       cinacalcet (SENSIPAR) tablet 30 mg  30 mg Oral Q M,W,F-HD Anthony Sar, MD       doxercalciferol (HECTOROL) injection 5 mcg  5 mcg Intravenous Q M,W,F-HD Anthony Sar, MD       isosorbide mononitrate (IMDUR) 24 hr tablet 30 mg  30 mg Oral Daily Darreld Mclean R, MD   30 mg at 05/19/23 2159   losartan (COZAAR) tablet 50 mg  50 mg Oral Daily Darreld Mclean R, MD   50 mg at 05/19/23 2200   metoprolol succinate (TOPROL-XL) 24 hr tablet 25 mg  25 mg Oral Daily Charlsie Quest, MD       ondansetron (ZOFRAN) tablet 4 mg  4 mg Oral Q6H PRN Charlsie Quest, MD       Or   ondansetron (ZOFRAN) injection 4 mg  4 mg Intravenous Q6H PRN Charlsie Quest, MD       pantoprazole (PROTONIX) EC tablet 40 mg  40 mg Oral BID Darreld Mclean R, MD   40 mg at 05/19/23 2159   senna-docusate (Senokot-S) tablet 1 tablet  1 tablet Oral QHS PRN Charlsie Quest, MD       sodium chloride flush (NS) 0.9 % injection 3 mL  3 mL Intravenous Q12H Darreld Mclean R, MD   3 mL at 05/19/23 2125    Allergies as of 05/19/2023 - Review Complete 05/19/2023  Allergen Reaction Noted   E-mycin [erythromycin] Hives    Lisinopril Cough 05/05/2018   Sulfa antibiotics Rash 06/25/2011    Social History   Socioeconomic History   Marital status: Widowed    Spouse name: Not on file   Number of children: Not on file   Years of education: Not on file   Highest education level: Not on file  Occupational History   Not on file  Tobacco Use   Smoking status: Former    Current packs/day: 0.00    Average packs/day: 1.5 packs/day for 34.9 years (52.4 ttl pk-yrs)    Types: Cigarettes    Start date: 63    Quit date: 01/10/2015    Years since quitting: 8.3   Smokeless tobacco: Former    Quit date: 01/09/2014  Vaping Use   Vaping status: Never Used  Substance and Sexual Activity    Alcohol use: No    Alcohol/week: 0.0 standard drinks of alcohol   Drug use: No   Sexual activity: Not on file  Other Topics Concern   Not on file  Social History Narrative   Not on file   Social Drivers of Health   Financial Resource Strain: Not on file  Food Insecurity: No Food Insecurity (05/20/2023)   Hunger Vital Sign    Worried About Running Out of Food in the Last Year: Never true    Ran Out of Food in the Last Year: Never true  Transportation Needs: No Transportation Needs (05/20/2023)   PRAPARE - Transportation  Lack of Transportation (Medical): No    Lack of Transportation (Non-Medical): No  Physical Activity: Not on file  Stress: Not on file  Social Connections: Not on file  Intimate Partner Violence: Not At Risk (05/20/2023)   Humiliation, Afraid, Rape, and Kick questionnaire    Fear of Current or Ex-Partner: No    Emotionally Abused: No    Physically Abused: No    Sexually Abused: No     Code Status   Code Status: Full Code  Review of Systems: All systems reviewed and negative except where noted in HPI.  Physical Exam: Vital signs in last 24 hours: Temp:  [97.7 F (36.5 C)-98.6 F (37 C)] 98 F (36.7 C) (04/16 0701) Pulse Rate:  [70-76] 70 (04/16 0701) Resp:  [17-25] 17 (04/16 0701) BP: (140-183)/(78-101) 169/80 (04/16 0701) SpO2:  [92 %-100 %] 93 % (04/16 0701) Weight:  [54 kg] (P) 54 kg (04/16 0328) Last BM Date : 05/19/23  General:  Pleasant female in NAD Psych:  Cooperative. Normal mood and affect Eyes: Pupils equal Ears:  Normal auditory acuity Nose: No deformity, discharge or lesions Neck:  Supple, no masses felt Lungs:  Clear to auscultation.  Heart:  Regular rate, regular rhythm.  Abdomen:  Soft, nondistended, nontender, active bowel sounds, no masses felt Rectal : No blood in the vault on DRE .  Small amount of soft light brown stool present  Msk: Symmetrical without gross deformities.  Neurologic:  Alert, oriented, grossly normal  neurologically Extremities : No edema Skin:  Intact without significant lesions.    Intake/Output from previous day: 04/15 0701 - 04/16 0700 In: 340 [P.O.:240; IV Piggyback:100] Out: 0  Intake/Output this shift:  No intake/output data recorded.   Willette Cluster, NP-C   05/20/2023, 9:02 AM

## 2023-05-20 NOTE — Consult Note (Signed)
 Renal Service Consult Note [facility]  Kendra Clay 05/20/2023 Lynae Sandifer, MD Requesting Physician: Dr. Efrain Grant  Reason for Consult: ESRD pt w/ acute GIB HPI: The patient is a 63 y.o. year-old w/ PMH as below who presented to ED c/o blood in her urine and in her stool.  In ED Hb 9.4, creat 5, BP's were wnl.  96% sats on room air lab showed sodium 133, K+ 4.2, BUN 22, creatinine 6.7, HGB 9, WBC 3K.  FOBT was positive, urine was untestable due to dark color.  CT angio of abdomen and pelvis showed polycystic kidneys with no other acute findings and no active bleeding.  Patient was admitted for acute lower GI bleeding.  We are asked to see for dialysis.   Pt seen in room.  They are letting her have clear liquids because she is going for GI procedures today.  She has no complaints at this time.   ROS - denies CP, no joint pain, no HA, no blurry vision, no rash, no diarrhea, no nausea/ vomiting  PMH: ESRD on HD Hypertension History of polycystic kidney disease Enlarged liver Scoliosis  Past Surgical History  Past Surgical History:  Procedure Laterality Date   A/V FISTULAGRAM Right 07/10/2022   Procedure: A/V Fistulagram;  Surgeon: Young Hensen, MD;  Location: [facility] INVASIVE CV LAB;  Service: Cardiovascular;  Laterality: Right;   A/V SHUNT INTERVENTION N/A 04/30/2023   Procedure: A/V SHUNT INTERVENTION;  Surgeon: Patrick Boor, MD;  Location: [facility] INVASIVE CV LAB;  Service: Cardiovascular;  Laterality: N/A;   ABDOMINAL HYSTERECTOMY     AV FISTULA PLACEMENT  09/01/2011   Procedure: ARTERIOVENOUS (AV) FISTULA CREATION;  Surgeon: Mayo Speck, MD;  Location: [facility] OR;  Service: Vascular;  Laterality: Right;   CESAREAN SECTION  1997   EYE SURGERY     for lazy eye   ORIF FEMUR FRACTURE Left 08/29/2021   Procedure: OPEN REDUCTION INTERNAL FIXATION (ORIF) DISTAL FEMUR FRACTURE;  Surgeon: Laneta Pintos, MD;  Location: [facility] OR;  Service: Orthopedics;  Laterality: Left;    ORIF WRIST FRACTURE Left 08/29/2021   Procedure: OPEN REDUCTION INTERNAL FIXATION (ORIF) WRIST FRACTURE;  Surgeon: Laneta Pintos, MD;  Location: [facility] OR;  Service: Orthopedics;  Laterality: Left;   PERIPHERAL VASCULAR BALLOON ANGIOPLASTY  07/10/2022   Procedure: PERIPHERAL VASCULAR BALLOON ANGIOPLASTY;  Surgeon: Young Hensen, MD;  [facility]: [facility] INVAS[facility] CV LAB;  Service: Cardiovascular;;   TIBIA IM NAIL INSERTION Left 05/19/2018   Procedure: INTRAMEDULLARY (IM) NAIL TIBIAL;  Surgeon: Micheline Ahr, MD;  Location: [facility] OR;  Service: Orthopedics;  Laterality: Left;   TIBIA IM NAIL INSERTION Right 08/29/2021   Procedure: INTRAMEDULLARY (IM) NAIL TIBIAL WITH IRRIGATION AND DEBRIDMENT;  Surgeon: Laneta Pintos, MD;  Location: [facility] OR;  Service: Orthopedics;  Laterality: Right;   VENOUS ANGIOPLASTY Right 04/30/2023   Procedure: VENOUS ANGIOPLASTY;  Surgeon: Patrick Boor, MD;  Location: [facility] INVASIVE CV LAB;  Service: Cardiovascular;  Laterality: Right;  cephalic arch in stent   Family History  Family History  Problem Relation Age of Onset   Arthritis Mother    Kidney disease Father    Polycystic kidney disease Son    Asthma Son    Hypertension Maternal Grandmother    Social History  reports that she quit smoking about 8 years ago. Her smoking use included cigarettes. She started smoking about 43 years ago. She has a 52.4 pack-year smoking history. She quit smokeless tobacco use about  9 years ago. She reports that she does not drink alcohol and does not use drugs. Allergies  Allergies  Allergen Reactions   E-Mycin [Erythromycin] Hives   Lisinopril Cough   Sulfa Antibiotics Rash    Skin Rash     Home medications Prior to Admission medications   Medication Sig Start Date End Date Taking? Authorizing Provider  acetaminophen (TYLENOL) 500 MG tablet Take 500 mg by mouth every 8 (eight) hours as needed for moderate pain. No more than 3 a day   Yes [provider]  aspirin EC 81 MG tablet  Take 81 mg by mouth at bedtime. Swallow whole.   Yes [provider]  atorvastatin (LIPITOR) 40 MG tablet Take 40 mg by mouth at bedtime.   Yes [provider]  Cinacalcet HCl (SENSIPAR PO) Take 1 tablet by mouth every Monday, Wednesday, and Friday with hemodialysis.   Yes [provider]  Darbepoetin Alfa (ARANESP) 150 MCG/0.3ML SOSY injection Inject 0.3 mLs (150 mcg total) into the vein every Wednesday with hemodialysis. 09/25/21  Yes Setzer, Lynnell Jude, PA-C  doxercalciferol (HECTOROL) 4 MCG/2ML injection Inject 4 mLs (8 mcg total) into the vein every Monday, Wednesday, and Friday with hemodialysis. 09/25/21  Yes Setzer, Lynnell Jude, PA-C  ferric citrate (AURYXIA) 1 GM 210 MG(Fe) tablet Take 210 mg by mouth 3 (three) times daily with meals.   Yes [provider]  fluticasone (FLONASE) 50 MCG/ACT nasal spray Place 1 spray into both nostrils daily as needed for allergies or rhinitis.   Yes [provider]  isosorbide mononitrate (IMDUR) 30 MG 24 hr tablet Take 0.5 tablets (15 mg total) by mouth daily. Patient taking differently: Take 30 mg by mouth daily. 02/01/23  Yes Adhikari, Willia Craze, MD  lidocaine (LIDODERM) 5 % Place 1 patch onto the skin daily. Remove & Discard patch within 12 hours or as directed by MD   Yes [provider]  loperamide (IMODIUM A-D) 2 MG tablet Take 2 mg by mouth daily as needed for diarrhea or loose stools.   Yes [provider]  loratadine (CLARITIN) 10 MG tablet Take 10 mg by mouth daily as needed for allergies.   Yes [provider]  losartan (COZAAR) 25 MG tablet Take 0.5 tablets (12.5 mg total) by mouth daily. Patient taking differently: Take 50 mg by mouth daily. 02/01/23  Yes Burnadette Pop, MD  metoprolol succinate (TOPROL-XL) 25 MG 24 hr tablet Take 25 mg by mouth daily. 03/16/23  Yes [provider]  nitroGLYCERIN (NITROSTAT) 0.4 MG SL tablet Place 0.4 mg under the tongue every 5 (five) minutes  as needed for chest pain.   Yes [provider]  pantoprazole (PROTONIX) 40 MG tablet Take 40 mg by mouth 2 (two) times daily. 02/26/20  Yes [provider]     Vitals:   05/20/23 1610 05/20/23 0328 05/20/23 0701 05/20/23 0949  BP: (!) 140/78 (!) (P) 152/88 (!) 169/80 (!) 173/86  Pulse: 75 (P) 74 70 77  Resp: 19  17 18   Temp:  (P) 97.8 F (36.6 C) 98 F (36.7 C) 98.4 F (36.9 C)  TempSrc:  (P) Oral Oral Oral  SpO2: 95% (P) 100% 93% 96%  Weight:  (P) 54 kg    Height:  (P) 5\' 3"  (1.6 m)     Exam Gen alert, no distress No rash, cyanosis or gangrene Sclera anicteric, throat clear  No jvd or bruits Chest clear bilat to bases, no rales/ wheezing RRR no MRG Abd soft  ntnd no mass or ascites +bs GU defer MS no joint effusions or deformity Ext no LE or UE edema, no other edema Neuro is alert, Ox 3 , nf    RUE AVF+bruit       Renal-related home meds: Auryxia 1 AC 3 times daily Losartan 50 mg daily Toprol XL 25 mg daily Others: PPI, Imdur, statin, sublingual nitroglycerin, aspirin   OP HD: MWF [place] 3h   B350  53.5kg    2K bath  AVF  Heparin 1200 Last HD 4/14, post wt 54kg Getting to dry wt Mircera 120 mcg q 2  Hectorol 5 mcg three times per week Sensipar 30mg  three times per week Venofer 50mg  weekly   Assessment/ Plan: Acute lower GI bleed: for GI procedure today. Hb 9-10 range. Per pmd ESRD: on HD MWF. Has not missed HD. HD later today or tonight.  HTN: BP's are stable, cont home meds Volume: close to dry wt, euvolemic on exam. Minimal UF w/ HD Anemia of esrd: Hb 9-10 here, follow, transfuse prn  Secondary hyperparathyroidism: CCa in range, add on phos. Cont binders w/ meals.    Larry Poag  MD [facility] 05/20/2023, 1:24 PM  Recent Labs  Lab 05/19/23 1540 05/20/23 0453  HGB 9.4* 9.0*  ALBUMIN 2.6*  --   CALCIUM 9.4 8.9  CREATININE 5.67* 6.68*  K 3.7 4.2   Inpatient medications:  atorvastatin  40 mg Oral QHS   Chlorhexidine Gluconate  Cloth  6 each Topical Q0600   cinacalcet  30 mg Oral Q M,W,F-HD   doxercalciferol  5 mcg Intravenous Q M,W,F-HD   isosorbide mononitrate  30 mg Oral Daily   losartan  50 mg Oral Daily   metoprolol succinate  25 mg Oral Daily   pantoprazole  40 mg Oral BID   sodium chloride flush  3 mL Intravenous Q12H    cefTRIAXone (ROCEPHIN)  IV Stopped (05/19/23 2226)   acetaminophen **OR** acetaminophen, ondansetron **OR** ondansetron (ZOFRAN) IV, senna-docusate

## 2023-05-20 NOTE — Plan of Care (Signed)

## 2023-05-20 NOTE — Progress Notes (Signed)
 Pt receives out-pt HD at [facility] on MWF 5:10 am chair time. Will assist as needed.   Lauraine Polite Renal Navigator [phone]

## 2023-05-20 NOTE — Progress Notes (Addendum)
 PROGRESS NOTE  Kendra Clay ZOX:[id number] DOB: 08-16-1960 DOA: 05/19/2023 PCP: Patient, [name]   LOS: 0 days   Brief narrative:  LYNDSAY TALAMANTE is a 63 y.o. female with medical history significant for coronary artery disease status post PCI to LAD,  HFmrEF, ESRD on MWF HD, polycystic liver and kidney disease, anemia of chronic disease, chronic thrombocytopenia, HTN, HLD who presented to the ED for evaluation of blood in her urine and stool.  She does not make much urine at baseline due to dialysis status but had 2-3 episodes of hematuria.  In the ED patient was hypotensive.  Hemoglobin was 9.4.  Creatinine elevated at 5.6.  FOBT was positive.  Urinalysis showed more than 50 RBCs.  CT of the abdomen and pelvis without visible contrast extravasation to localize GI bleed.  No aneurysm or dissection.  Changes of polycystic kidney disease with enlarged kidneys bilaterally and innumerable hepatic and renal cysts noted and unchanged.  No acute findings.  GI and nephrology was consulted and patient was then considered for admission to the hospital.     Assessment/Plan: Principal Problem:   Acute GI bleeding Active Problems:   Hematuria   ESRD on dialysis [facility])   Coronary artery disease involving native coronary artery of native heart without angina pectoris   Chronic heart failure with mildly reduced ejection fraction (HFmrEF, 41-49%) (HCC)   Hyperlipidemia   Essential hypertension   Anemia of chronic disease   Thrombocytopenia (HCC)  Acute lower GI bleeding: Presenting with a red to mooron red color blood per rectum.  CT angio without visible contrast extravasation to localize GI bleed otherwise no acute changes.  Continue to hold aspirin.  GI has been notified for consultation.  Will follow recommendations.  Continue clears, PPI.  Might need colonoscopy.   Hematuria: History of polycystic kidney disease.  On dialysis. UA shows >50 RBCs with many bacteria.  This may be due to  cystitis/UTI. Follow urine cultures.  Continue Rocephin.  Renal ultrasound shows polycystic kidney disease with echogenic focus in the urinary bladder likely representing thrombus.   Anemia of chronic disease: Hemoglobin 9.4 on admission, hemoglobin today at 9.0.  baseline 9.5-10.0.  Continue to monitor and transfuse as needed.   ESRD on MWF HD: No emergent dialysis needs.  Nephrology on board for hemodialysis needs.   CAD s/p PCI to LAD 2017: Stable, denies chest pain.  Holding aspirin.  Continue Toprol-XL, Imdur, atorvastatin.   Chronic HFmrEF: TTE 12/16/2022 showed EF 40-45%, moderate concentric LVH.  Compensated at this time.  Volume managed by dialysis.  Continue Toprol-XL, losartan   Chronic thrombocytopenia: Mild thrombocytopenia at 61K.  Continue to monitor.   Hypertension: Resume losartan, Toprol-XL, Imdur.   Hyperlipidemia: Continue atorvastatin.  DVT prophylaxis: SCDs Start: 05/19/23 2123   Disposition: Home likely in 1 to 2 days  Status is: Observation The patient will require care spanning > 2 midnights and should be moved to inpatient because: GI bleed, hematuria, need for closer monitoring, possible need for GI intervention.    Code Status:     Code Status: Full Code  Family Communication: None at bedside  Consultants: Nephrology and GI  Procedures: None yet  Anti-infectives:  Rocephin IV  Anti-infectives (From admission, onward)    Start     Dose/Rate Route Frequency Ordered Stop   05/19/23 2200  cefTRIAXone (ROCEPHIN) 1 g in sodium chloride 0.9 % 100 mL IVPB        1 g 200 mL/hr over 30 Minutes  Intravenous Every 24 hours 05/19/23 2133          Subjective: Today, patient was seen and examined at bedside.  Complains of red-colored urine but had not had a bowel movement since yesterday.  Denies any nausea vomiting fever chills or shortness of breath.  Objective: Vitals:   05/20/23 0701 05/20/23 0949  BP: (!) 169/80 (!) 173/86  Pulse: 70  77  Resp: 17 18  Temp: 98 F (36.7 C) 98.4 F (36.9 C)  SpO2: 93% 96%    Intake/Output Summary (Last 24 hours) at 05/20/2023 1306 Last data filed at 05/20/2023 1216 Gross per 24 hour  Intake 340 ml  Output 0 ml  Net 340 ml   Filed Weights   05/20/23 0328  Weight: (P) 54 kg   Body mass index is 21.09 kg/m (pended).   Physical Exam: GENERAL: Patient is alert awake and oriented. Not in obvious distress. HENT: No scleral pallor or icterus. Pupils equally reactive to light. Oral mucosa is moist NECK: is supple, no gross swelling noted. CHEST: Clear to auscultation. No crackles or wheezes.   CVS: S1 and S2 heard, no murmur. Regular rate and rhythm.  ABDOMEN: Soft, non-tender, bowel sounds are present. EXTREMITIES: Right upper extremity fistula in place. CNS: Cranial nerves are intact. No focal motor deficits. SKIN: warm and dry without rashes.  Data Review: I have personally reviewed the following laboratory data and studies,  CBC: Recent Labs  Lab 05/19/23 1540 05/20/23 0453  WBC 4.2 3.0*  NEUTROABS 2.0  --   HGB 9.4* 9.0*  HCT 30.1* 28.5*  MCV 100.0 97.9  PLT 71* 61*   Basic Metabolic Panel: Recent Labs  Lab 05/19/23 1540 05/20/23 0453  NA 134* 133*  K 3.7 4.2  CL 93* 92*  CO2 29 28  GLUCOSE 85 82  BUN 16 22  CREATININE 5.67* 6.68*  CALCIUM 9.4 8.9   Liver Function Tests: Recent Labs  Lab 05/19/23 1540  AST 13*  ALT 7  ALKPHOS 112  BILITOT 1.4*  PROT 7.2  ALBUMIN 2.6*   No results for input(s): "LIPASE", "AMYLASE" in the last 168 hours. No results for input(s): "AMMONIA" in the last 168 hours. Cardiac Enzymes: No results for input(s): "CKTOTAL", "CKMB", "CKMBINDEX", "TROPONINI" in the last 168 hours. BNP (last 3 results) Recent Labs    12/29/22 1119 01/29/23 1239  BNP 2,023.8* 2,180.5*    ProBNP (last 3 results) No results for input(s): "PROBNP" in the last 8760 hours.  CBG: No results for input(s): "GLUCAP" in the last 168  hours. Recent Results (from the past 240 hours)  Urine Culture     Status: Abnormal (Preliminary result)   Collection Time: 05/19/23  7:53 PM   Specimen: Urine, Clean Catch  Result Value Ref Range Status   Specimen Description URINE, CLEAN CATCH  Final   Special Requests NONE  Final   Culture (A)  Final    20,000 COLONIES/mL ESCHERICHIA COLI SUSCEPTIBILITIES TO FOLLOW Performed at [facility], [address] [place], [place] [address]    Report Status PENDING  Incomplete  MRSA Next Gen by PCR, Nasal     Status: None   Collection Time: 05/20/23  3:40 AM   Specimen: Nasal Mucosa; Nasal Swab  Result Value Ref Range Status   MRSA by PCR Next Gen NOT DETECTED NOT DETECTED Final    Comment: (NOTE) The GeneXpert MRSA Assay (FDA approved for NASAL specimens only), is one component of a comprehensive MRSA colonization surveillance program.  It is not intended to diagnose MRSA infection nor to guide or monitor treatment for MRSA infections. Test performance is not FDA approved in patients less than 84 years old. Performed at [facility], [address], [place], [place] [address]      Studies: US RENAL Result Date: 05/19/2023 CLINICAL DATA:  Hematuria EXAM: RENAL / URINARY TRACT ULTRASOUND COMPLETE COMPARISON:  None Available. FINDINGS: Right Kidney: Renal measurements: 19.1 x 10.2 x 11.6 cm. = volume: 1181 mL. Innumerable cysts are noted throughout the right kidney consistent with the previous findings of CT examination. No mass lesion is noted. Left Kidney: Renal measurements: 17.9 x 9.6 x 13.7 cm. = volume: 1233 mL. Innumerable cysts are noted similar to that seen on the right. No obstructive changes are noted. Bladder: Bladder is well distended. Echogenic focus is noted measuring 2.1 x 1.0 x 1.9 cm. Given the patient's clinical history of hematuria this likely represents thrombus. It was not well appreciated on the recent CT. Other: None. IMPRESSION: Changes consistent with  polycystic kidney disease. Echogenic focus within the urinary bladder likely representing thrombus. Electronically Signed   By: Alcide Clever M.D.   On: 05/19/2023 23:59   CT Angio Abd/Pel W and/or Wo Contrast Result Date: 05/19/2023 CLINICAL DATA:  Lower GI bleed EXAM: CTA ABDOMEN AND PELVIS WITHOUT AND WITH CONTRAST TECHNIQUE: Multidetector CT imaging of the abdomen and pelvis was performed using the standard protocol during bolus administration of intravenous contrast. Multiplanar reconstructed images and MIPs were obtained and reviewed to evaluate the vascular anatomy. RADIATION DOSE REDUCTION: This exam was performed according to the departmental dose-optimization program which includes automated exposure control, adjustment of the mA and/or kV according to patient size and/or use of iterative reconstruction technique. CONTRAST:  75mL OMNIPAQUE IOHEXOL 350 MG/ML SOLN COMPARISON:  04/25/2023 FINDINGS: VASCULAR Aorta: Aortic atherosclerosis.  No aneurysm or dissection. Celiac: Patent without evidence of aneurysm, dissection, vasculitis or significant stenosis. SMA: Patent without evidence of aneurysm, dissection, vasculitis or significant stenosis. Renals: Small caliber bilaterally. IMA: Patent without evidence of aneurysm, dissection, vasculitis or significant stenosis. Inflow: Atherosclerotic calcifications.  No aneurysm or dissection. Proximal Outflow: Atherosclerotic calcifications. No aneurysm or dissection or significant stenosis. Veins: No obvious venous abnormality within the limitations of this arterial phase study. Review of the MIP images confirms the above findings. NON-VASCULAR Lower chest: Cardiomegaly.  No acute findings. Hepatobiliary: Innumerable cysts throughout the liver, stable. Gallbladder unremarkable. No biliary ductal dilatation. Pancreas: No focal abnormality or ductal dilatation. Spleen: Stable 1.7 cm low-density lesion most compatible with cyst. Adrenals/Urinary Tract: Normal adrenal  glands. Enlarged kidneys with innumerable bilateral cysts compatible with polycystic kidney disease, unchanged. No hydronephrosis. Urinary bladder unremarkable. Stomach/Bowel: Stomach, large and small bowel grossly unremarkable. No contrast extravasation to localize GI bleed. Lymphatic: No adenopathy Reproductive: Prior hysterectomy.  No adnexal masses. Other: No free fluid or free air. Musculoskeletal: No acute bony abnormality. IMPRESSION: VASCULAR Aortoiliac atherosclerosis.  No aneurysm or dissection. No visible contrast extravasation to localize GI bleed. NON-VASCULAR Changes of polycystic kidney disease with enlarged kidneys bilaterally and innumerable hepatic and renal cysts, unchanged. No acute findings. Electronically Signed   By: Charlett Nose M.D.   On: 05/19/2023 19:16      Joycelyn Das, MD  [facility] 05/20/2023  If 7PM-7AM, please contact night-coverage

## 2023-05-20 NOTE — Care Management Obs Status (Signed)
 MEDICARE OBSERVATION STATUS NOTIFICATION   Patient Details  Name: Kendra Clay MRN: [id number] Date of Birth: 1960/06/01   Medicare Observation Status Notification Given:  Yes  [place]/OBS LETTER DONE AND COPY LEFT  Quintavius Niebuhr 05/20/2023, 9:34 AM

## 2023-05-21 ENCOUNTER — Encounter (HOSPITAL_COMMUNITY): Payer: Self-pay | Admitting: Internal Medicine

## 2023-05-21 ENCOUNTER — Encounter (HOSPITAL_COMMUNITY)
Admission: EM | Disposition: A | Discharge status: Home Health | Payer: Self-pay | Source: Home / Self Care | Attending: Internal Medicine

## 2023-05-21 ENCOUNTER — Inpatient Hospital Stay (HOSPITAL_COMMUNITY): Admitting: Anesthesiology

## 2023-05-21 DIAGNOSIS — I2510 Atherosclerotic heart disease of native coronary artery without angina pectoris: Secondary | ICD-10-CM

## 2023-05-21 DIAGNOSIS — K922 Gastrointestinal hemorrhage, unspecified: Secondary | ICD-10-CM | POA: Diagnosis not present

## 2023-05-21 DIAGNOSIS — K5520 Angiodysplasia of colon without hemorrhage: Secondary | ICD-10-CM

## 2023-05-21 DIAGNOSIS — K648 Other hemorrhoids: Secondary | ICD-10-CM

## 2023-05-21 DIAGNOSIS — Z87891 Personal history of nicotine dependence: Secondary | ICD-10-CM

## 2023-05-21 HISTORY — PX: COLONOSCOPY: SHX5424

## 2023-05-21 LAB — MISC LABCORP TEST (SEND OUT)
Labcorp test code: 6510
Labcorp test code: 83935

## 2023-05-21 LAB — BASIC METABOLIC PANEL WITH GFR
Anion gap: 13 (ref 5–15)
BUN: 11 mg/dL (ref 8–23)
CO2: 28 mmol/L (ref 22–32)
Calcium: 8.7 mg/dL — ABNORMAL LOW (ref 8.9–10.3)
Chloride: 94 mmol/L — ABNORMAL LOW (ref 98–111)
Creatinine, Ser: 4.42 mg/dL — ABNORMAL HIGH (ref 0.44–1.00)
GFR, Estimated: 11 mL/min — ABNORMAL LOW (ref >60)
Glucose, Bld: 90 mg/dL (ref 70–99)
Potassium: 4 mmol/L (ref 3.5–5.1)
Sodium: 135 mmol/L (ref 135–145)

## 2023-05-21 LAB — HEPATITIS B SURFACE ANTIBODY, QUANTITATIVE: Hep B S AB Quant (Post): 40.5 m[IU]/mL

## 2023-05-21 LAB — URINE CULTURE: Culture: 20000 — AB

## 2023-05-21 LAB — CBC
HCT: 28.5 % — ABNORMAL LOW (ref 36.0–46.0)
Hemoglobin: 9.1 g/dL — ABNORMAL LOW (ref 12.0–15.0)
MCH: 31.2 pg (ref 26.0–34.0)
MCHC: 31.9 g/dL (ref 30.0–36.0)
MCV: 97.6 fL (ref 80.0–100.0)
Platelets: 59 10*3/uL — ABNORMAL LOW (ref 150–400)
RBC: 2.92 MIL/uL — ABNORMAL LOW (ref 3.87–5.11)
RDW: 18.3 % — ABNORMAL HIGH (ref 11.5–15.5)
WBC: 3.8 10*3/uL — ABNORMAL LOW (ref 4.0–10.5)
nRBC: 0 % (ref 0.0–0.2)

## 2023-05-21 LAB — MAGNESIUM: Magnesium: 1.7 mg/dL (ref 1.7–2.4)

## 2023-05-21 LAB — IRON AND TIBC
Iron: 86 ug/dL (ref 28–170)
Saturation Ratios: 58 % — ABNORMAL HIGH (ref 10.4–31.8)
TIBC: 148 ug/dL — ABNORMAL LOW (ref 250–450)
UIBC: 62 ug/dL

## 2023-05-21 LAB — HEPATITIS B SURFACE ANTIGEN: Hepatitis B Surface Ag: NONREACTIVE

## 2023-05-21 LAB — FERRITIN: Ferritin: 790 ng/mL — ABNORMAL HIGH (ref 11–307)

## 2023-05-21 LAB — FOLATE: Folate: 4.7 ng/mL — ABNORMAL LOW (ref >5.9)

## 2023-05-21 LAB — VITAMIN B12: Vitamin B-12: 207 pg/mL (ref 180–914)

## 2023-05-21 SURGERY — COLONOSCOPY
Anesthesia: Monitor Anesthesia Care

## 2023-05-21 MED ORDER — SODIUM CHLORIDE 0.9 % IV SOLN
INTRAVENOUS | Status: AC | PRN
  Administered 2023-05-21: 500 mL via INTRAVENOUS

## 2023-05-21 MED ORDER — VITAMIN B-12 1000 MCG PO TABS
1000.0000 ug | ORAL_TABLET | Freq: Every day | ORAL | Status: DC
Start: 2023-05-21 — End: 2023-05-23
  Administered 2023-05-21 – 2023-05-23 (×2): 1000 ug via ORAL
  Filled 2023-05-21 (×2): qty 1

## 2023-05-21 MED ORDER — FOLIC ACID 1 MG PO TABS
1.0000 mg | ORAL_TABLET | Freq: Every day | ORAL | Status: DC
  Administered 2023-05-21 – 2023-05-23 (×2): 1 mg via ORAL
  Filled 2023-05-21 (×2): qty 1

## 2023-05-21 MED ORDER — PROPOFOL 10 MG/ML IV BOLUS
INTRAVENOUS | Status: DC | PRN
Start: 2023-05-21 — End: 2023-05-21
  Administered 2023-05-21: 25 mg via INTRAVENOUS
  Administered 2023-05-21: 100 ug/kg/min via INTRAVENOUS
  Administered 2023-05-21: 25 mg via INTRAVENOUS

## 2023-05-21 NOTE — Progress Notes (Addendum)
 [facility] Progress Note   Subjective: Seen in room. Colonoscopy unremarkable. Finished HD early this AM. Minimal UF. Denies pain at present.   Objective Vitals:   05/21/23 1103 05/21/23 1110 05/21/23 1120 05/21/23 1149  BP: (!) 150/78 (!) 149/78 (!) 162/79 (!) 156/97  Pulse: 66 65 65 71  Resp: (!) 21 19 16    Temp:      TempSrc:      SpO2: 92% 92% 100% 100%  Weight:      Height:       Physical Exam General:Chronically ill appearing female in NAD Heart: S1,S2 RRR No M/R/G Lungs: CTAB. No WOB.  Abdomen: NABS, NT Extremities:No LE edema Dialysis Access: AVF aneurysmal + T/B  Additional Objective Labs: Basic Metabolic Panel: Recent Labs  Lab 05/19/23 1540 05/20/23 0453 05/21/23 0448  NA 134* 133* 135  K 3.7 4.2 4.0  CL 93* 92* 94*  CO2 29 28 28   GLUCOSE 85 82 90  BUN 16 22 11   CREATININE 5.67* 6.68* 4.42*  CALCIUM 9.4 8.9 8.7*   Liver Function Tests: Recent Labs  Lab 05/19/23 1540  AST 13*  ALT 7  ALKPHOS 112  BILITOT 1.4*  PROT 7.2  ALBUMIN 2.6*   No results for input(s): "LIPASE", "AMYLASE" in the last 168 hours. CBC: Recent Labs  Lab 05/19/23 1540 05/20/23 0453 05/20/23 1158 05/21/23 0448  WBC 4.2 3.0* 4.0 3.8*  NEUTROABS 2.0  --   --   --   HGB 9.4* 9.0* 8.8* 9.1*  HCT 30.1* 28.5* 27.2* 28.5*  MCV 100.0 97.9 98.6 97.6  PLT 71* 61* 57* 59*   Blood Culture    Component Value Date/Time   SDES URINE, CLEAN CATCH 05/19/2023 1953   SPECREQUEST  05/19/2023 1953    NONE Performed at [facility], [address], [place], [place] [address]    CULT 20,000 COLONIES/mL ESCHERICHIA COLI (A) 05/19/2023 1953   REPTSTATUS 05/21/2023 FINAL 05/19/2023 1953    Cardiac Enzymes: No results for input(s): "CKTOTAL", "CKMB", "CKMBINDEX", "TROPONINI" in the last 168 hours. CBG: No results for input(s): "GLUCAP" in the last 168 hours. Iron Studies:  Recent Labs    05/21/23 0448  IRON 86  TIBC 148*  FERRITIN 790*    @lablastinr3 @ Studies/Results: US RENAL Result Date: 05/19/2023 CLINICAL DATA:  Hematuria EXAM: RENAL / URINARY TRACT ULTRASOUND COMPLETE COMPARISON:  None Available. FINDINGS: Right Kidney: Renal measurements: 19.1 x 10.2 x 11.6 cm. = volume: 1181 mL. Innumerable cysts are noted throughout the right kidney consistent with the previous findings of CT examination. No mass lesion is noted. Left Kidney: Renal measurements: 17.9 x 9.6 x 13.7 cm. = volume: 1233 mL. Innumerable cysts are noted similar to that seen on the right. No obstructive changes are noted. Bladder: Bladder is well distended. Echogenic focus is noted measuring 2.1 x 1.0 x 1.9 cm. Given the patient's clinical history of hematuria this likely represents thrombus. It was not well appreciated on the recent CT. Other: None. IMPRESSION: Changes consistent with polycystic kidney disease. Echogenic focus within the urinary bladder likely representing thrombus. Electronically Signed   By: Alcide Clever M.D.   On: 05/19/2023 23:59   CT Angio Abd/Pel W and/or Wo Contrast Result Date: 05/19/2023 CLINICAL DATA:  Lower GI bleed EXAM: CTA ABDOMEN AND PELVIS WITHOUT AND WITH CONTRAST TECHNIQUE: Multidetector CT imaging of the abdomen and pelvis was performed using the standard protocol during bolus administration of intravenous contrast. Multiplanar reconstructed images and MIPs were obtained and reviewed  to evaluate the vascular anatomy. RADIATION DOSE REDUCTION: This exam was performed according to the departmental dose-optimization program which includes automated exposure control, adjustment of the mA and/or kV according to patient size and/or use of iterative reconstruction technique. CONTRAST:  75mL OMNIPAQUE IOHEXOL 350 MG/ML SOLN COMPARISON:  04/25/2023 FINDINGS: VASCULAR Aorta: Aortic atherosclerosis.  No aneurysm or dissection. Celiac: Patent without evidence of aneurysm, dissection, vasculitis or significant stenosis. SMA: Patent without evidence  of aneurysm, dissection, vasculitis or significant stenosis. Renals: Small caliber bilaterally. IMA: Patent without evidence of aneurysm, dissection, vasculitis or significant stenosis. Inflow: Atherosclerotic calcifications.  No aneurysm or dissection. Proximal Outflow: Atherosclerotic calcifications. No aneurysm or dissection or significant stenosis. Veins: No obvious venous abnormality within the limitations of this arterial phase study. Review of the MIP images confirms the above findings. NON-VASCULAR Lower chest: Cardiomegaly.  No acute findings. Hepatobiliary: Innumerable cysts throughout the liver, stable. Gallbladder unremarkable. No biliary ductal dilatation. Pancreas: No focal abnormality or ductal dilatation. Spleen: Stable 1.7 cm low-density lesion most compatible with cyst. Adrenals/Urinary Tract: Normal adrenal glands. Enlarged kidneys with innumerable bilateral cysts compatible with polycystic kidney disease, unchanged. No hydronephrosis. Urinary bladder unremarkable. Stomach/Bowel: Stomach, large and small bowel grossly unremarkable. No contrast extravasation to localize GI bleed. Lymphatic: No adenopathy Reproductive: Prior hysterectomy.  No adnexal masses. Other: No free fluid or free air. Musculoskeletal: No acute bony abnormality. IMPRESSION: VASCULAR Aortoiliac atherosclerosis.  No aneurysm or dissection. No visible contrast extravasation to localize GI bleed. NON-VASCULAR Changes of polycystic kidney disease with enlarged kidneys bilaterally and innumerable hepatic and renal cysts, unchanged. No acute findings. Electronically Signed   By: Charlett Nose M.D.   On: 05/19/2023 19:16   Medications:  cefTRIAXone (ROCEPHIN)  IV Stopped (05/21/23 2841)    atorvastatin  40 mg Oral QHS   Chlorhexidine Gluconate Cloth  6 each Topical Q0600   cinacalcet  30 mg Oral Q M,W,F-HD   doxercalciferol  5 mcg Intravenous Q M,W,F-HD   folic acid  1 mg Oral Daily   isosorbide mononitrate  30 mg Oral Daily    losartan  50 mg Oral Daily   metoprolol succinate  25 mg Oral Daily   pantoprazole  40 mg Oral BID   sodium chloride flush  3 mL Intravenous Q12H     OP HD: MWF Natalia 3h   B350  53.5kg    2K bath  AVF   - Heparin 1200 units  Last HD 4/14, post wt 54kg Getting to dry wt - Mircera 120 mcg q 2 weeks (next dose due 05/25/2023) - Hectorol 5 mcg three times per week - Sensipar 30mg  three times per week - Venofer 50mg  weekly     Assessment/ Plan: Acute lower GI bleed: GI consulted. Colonoscopy today-unremarkable. Hb 9-10 range. Per pmd Hematuria-possible UTI-per primary ESRD: on HD MWF. HD 05/22/2023. This patient NEVER misses HD and can be trusted to give an accurate opinion of what she needs. Will hold heparin with hematuria. Marland Kitchen  HTN: BP's are stable, cont home meds Volume: close to dry wt, euvolemic on exam. Minimal UF w/ HD Anemia of esrd: Hb 9-10 here, follow, transfuse prn  Secondary hyperparathyroidism: CCa in range, add on phos. Cont binders w/ meals.   Jossalyn Forgione H. Vi Whitesel NP-C 05/21/2023, 12:17 PM  [employer] [phone]

## 2023-05-21 NOTE — Progress Notes (Addendum)
 PROGRESS NOTE  Kendra Clay:[id number] DOB: 08-Oct-1960 DOA: 05/19/2023 PCP: Patient, [name]   LOS: 1 day   Brief narrative:  Kendra Clay is a 63 y.o. female with medical history significant for coronary artery disease status post PCI to LAD,  HFmrEF, ESRD on MWF HD, polycystic liver and kidney disease, anemia of chronic disease, chronic thrombocytopenia, HTN, HLD who presented to the ED for evaluation of blood in her urine and stool.  She does not make much urine at baseline due to dialysis status but had 2-3 episodes of hematuria.  In the ED patient was hypotensive.  Hemoglobin was 9.4.  Creatinine elevated at 5.6.  FOBT was positive.  Urinalysis showed more than 50 RBCs.  CT of the abdomen and pelvis without visible contrast extravasation to localize GI bleed.  No aneurysm or dissection.  Changes of polycystic kidney disease with enlarged kidneys bilaterally and innumerable hepatic and renal cysts noted and unchanged.  No acute findings.  GI and nephrology was consulted and patient was then considered for admission to the hospital.     Assessment/Plan: Principal Problem:   Acute GI bleeding Active Problems:   Hematuria   ESRD on dialysis [facility])   Coronary artery disease involving native coronary artery of native heart without angina pectoris   Chronic heart failure with mildly reduced ejection fraction (HFmrEF, 41-49%) (HCC)   Hyperlipidemia   Essential hypertension   Anemia of chronic disease   Thrombocytopenia (HCC)   GI bleed  Acute lower GI bleeding: Presenting with a red to mooron red color blood per rectum.  CT angio without visible contrast extravasation to localize GI bleed otherwise no acute changes.  Continue to hold aspirin.  GI has seen the patient and patient underwent colonoscopy on 05/21/2023 with findings of localized angiectasia without bleeding which were coagulated with nonbleeding internal hemorrhoids.  GI without any impression regarding GI bleed and  urethral bleeding was noted.  Likely hematuria causing anemia.  hematuria: History of polycystic kidney disease.  On hemo-dialysis. UA shows >50 RBCs with many bacteria.  T urine culture showed 20,000 colonies of E. coli.  Will stop after 3-day course of Rocephin.  Renal ultrasound shows polycystic kidney disease with echogenic focus in the urinary bladder likely representing thrombus.  Discussed with nephrology and GI.  At this time we will get urology opinion for ongoing hematuria.  Patient states that she has been seeing bright red to dark red urine every time she urinates usually every day or every other day.  Anemia of chronic disease: Hemoglobin 9.4 on admission, hemoglobin today at 9.1 from 9.0..  baseline 9.5-10.0.  Continue to monitor and transfuse as needed.  Will see urology recommendation.  Vitamin B12 was low at 207.  Ferritin elevated at 790.  Folic acid low at 4.7.  Will replace.  Folic acid deficiency.  Borderline low vitamin B12.  Will replenish.   ESRD on MWF HD: No emergent dialysis needs.  Nephrology on board for hemodialysis needs.   CAD s/p PCI to LAD 2017: Stable, denies chest pain.  Holding aspirin.  Continue Toprol-XL, Imdur, atorvastatin.   Chronic HFmrEF: TTE 12/16/2022 showed EF 40-45%, moderate concentric LVH.  Compensated at this time.  Volume managed by dialysis.  Continue Toprol-XL, losartan   Chronic thrombocytopenia: Mild thrombocytopenia at 61K.  Continue to monitor.   Hypertension: Resume losartan, Toprol-XL, Imdur.   Hyperlipidemia: Continue atorvastatin.  Hyponatremia.  Mild.  Improved.  DVT prophylaxis: SCDs Start: 05/19/23 2123   Disposition:  Home likely in 1 to 2 days,  Status is: Inpatient The patient is inpatient because:  hematuria, need for closer monitoring, status post colonoscopy, pending urology input,    Code Status:     Code Status: Full Code  Family Communication: None at bedside  Consultants: Nephrology    GI Urology  Procedures: Colonoscopy on 05/21/2023 Hemodialysis  Anti-infectives:  Rocephin IV  Anti-infectives (From admission, onward)    Start     Dose/Rate Route Frequency Ordered Stop   05/19/23 2200  cefTRIAXone (ROCEPHIN) 1 g in sodium chloride 0.9 % 100 mL IVPB        1 g 200 mL/hr over 30 Minutes Intravenous Every 24 hours 05/19/23 2133 05/22/23 2159        Subjective: Today, patient was seen and examined at bedside.  Seen after colonoscopy.  Feels good that she is able to eat now.  States that she has been noticing dark red color urine every time she urinates.  Objective: Vitals:   05/21/23 1120 05/21/23 1149  BP: (!) 162/79 (!) 156/97  Pulse: 65 71  Resp: 16   Temp:    SpO2: 100% 100%    Intake/Output Summary (Last 24 hours) at 05/21/2023 1408 Last data filed at 05/21/2023 0618 Gross per 24 hour  Intake 1180 ml  Output 400 ml  Net 780 ml   Filed Weights   05/20/23 0328 05/20/23 2250  Weight: (P) 54 kg 54 kg   Body mass index is 21.09 kg/m (pended).   Physical Exam: GENERAL: Patient is alert awake and oriented. Not in obvious distress. HENT: Mild pallor noted.. Pupils equally reactive to light. Oral mucosa is moist NECK: is supple, no gross swelling noted. CHEST: Clear to auscultation. No crackles or wheezes.   CVS: S1 and S2 heard, no murmur. Regular rate and rhythm.  ABDOMEN: Soft, non-tender, bowel sounds are present. EXTREMITIES: Right upper extremity fistula in place. CNS: Cranial nerves are intact. No focal motor deficits. SKIN: warm and dry without rashes.  Data Review: I have personally reviewed the following laboratory data and studies,  CBC: Recent Labs  Lab 05/19/23 1540 05/20/23 0453 05/20/23 1158 05/21/23 0448  WBC 4.2 3.0* 4.0 3.8*  NEUTROABS 2.0  --   --   --   HGB 9.4* 9.0* 8.8* 9.1*  HCT 30.1* 28.5* 27.2* 28.5*  MCV 100.0 97.9 98.6 97.6  PLT 71* 61* 57* 59*   Basic Metabolic Panel: Recent Labs  Lab 05/19/23 1540  05/20/23 0453 05/21/23 0448  NA 134* 133* 135  K 3.7 4.2 4.0  CL 93* 92* 94*  CO2 29 28 28   GLUCOSE 85 82 90  BUN 16 22 11   CREATININE 5.67* 6.68* 4.42*  CALCIUM 9.4 8.9 8.7*  MG  --   --  1.7   Liver Function Tests: Recent Labs  Lab 05/19/23 1540  AST 13*  ALT 7  ALKPHOS 112  BILITOT 1.4*  PROT 7.2  ALBUMIN 2.6*   No results for input(s): "LIPASE", "AMYLASE" in the last 168 hours. No results for input(s): "AMMONIA" in the last 168 hours. Cardiac Enzymes: No results for input(s): "CKTOTAL", "CKMB", "CKMBINDEX", "TROPONINI" in the last 168 hours. BNP (last 3 results) Recent Labs    12/29/22 1119 01/29/23 1239  BNP 2,023.8* 2,180.5*    ProBNP (last 3 results) No results for input(s): "PROBNP" in the last 8760 hours.  CBG: No results for input(s): "GLUCAP" in the last 168 hours. Recent Results (from the past 240 hours)  Urine  Culture     Status: Abnormal   Collection Time: 05/19/23  7:53 PM   Specimen: Urine, Clean Catch  Result Value Ref Range Status   Specimen Description URINE, CLEAN CATCH  Final   Special Requests   Final    NONE Performed at [facility], [address] [place], [place] [address]    Culture 20,000 COLONIES/mL ESCHERICHIA COLI (A)  Final   Report Status 05/21/2023 FINAL  Final   Organism ID, Bacteria ESCHERICHIA COLI (A)  Final      Susceptibility   Escherichia coli - MIC*    AMPICILLIN 4 SENSITIVE Sensitive     CEFAZOLIN <=4 SENSITIVE Sensitive     CEFEPIME <=0.12 SENSITIVE Sensitive     CEFTRIAXONE <=0.25 SENSITIVE Sensitive     CIPROFLOXACIN <=0.25 SENSITIVE Sensitive     GENTAMICIN <=1 SENSITIVE Sensitive     IMIPENEM <=0.25 SENSITIVE Sensitive     NITROFURANTOIN <=16 SENSITIVE Sensitive     TRIMETH/SULFA <=20 SENSITIVE Sensitive     AMPICILLIN/SULBACTAM <=2 SENSITIVE Sensitive     PIP/TAZO <=4 SENSITIVE Sensitive ug/mL    * 20,000 COLONIES/mL ESCHERICHIA COLI  MRSA Next Gen by PCR, Nasal     Status: None   Collection  Time: 05/20/23  3:40 AM   Specimen: Nasal Mucosa; Nasal Swab  Result Value Ref Range Status   MRSA by PCR Next Gen NOT DETECTED NOT DETECTED Final    Comment: (NOTE) The GeneXpert MRSA Assay (FDA approved for NASAL specimens only), is one component of a comprehensive MRSA colonization surveillance program. It is not intended to diagnose MRSA infection nor to guide or monitor treatment for MRSA infections. Test performance is not FDA approved in patients less than 88 years old. Performed at [facility], [address] [place], [place] [address]      Studies: US RENAL Result Date: 05/19/2023 CLINICAL DATA:  Hematuria EXAM: RENAL / URINARY TRACT ULTRASOUND COMPLETE COMPARISON:  None Available. FINDINGS: Right Kidney: Renal measurements: 19.1 x 10.2 x 11.6 cm. = volume: 1181 mL. Innumerable cysts are noted throughout the right kidney consistent with the previous findings of CT examination. No mass lesion is noted. Left Kidney: Renal measurements: 17.9 x 9.6 x 13.7 cm. = volume: 1233 mL. Innumerable cysts are noted similar to that seen on the right. No obstructive changes are noted. Bladder: Bladder is well distended. Echogenic focus is noted measuring 2.1 x 1.0 x 1.9 cm. Given the patient's clinical history of hematuria this likely represents thrombus. It was not well appreciated on the recent CT. Other: None. IMPRESSION: Changes consistent with polycystic kidney disease. Echogenic focus within the urinary bladder likely representing thrombus. Electronically Signed   By: Alcide Clever M.D.   On: 05/19/2023 23:59   CT Angio Abd/Pel W and/or Wo Contrast Result Date: 05/19/2023 CLINICAL DATA:  Lower GI bleed EXAM: CTA ABDOMEN AND PELVIS WITHOUT AND WITH CONTRAST TECHNIQUE: Multidetector CT imaging of the abdomen and pelvis was performed using the standard protocol during bolus administration of intravenous contrast. Multiplanar reconstructed images and MIPs were obtained and reviewed to evaluate  the vascular anatomy. RADIATION DOSE REDUCTION: This exam was performed according to the departmental dose-optimization program which includes automated exposure control, adjustment of the mA and/or kV according to patient size and/or use of iterative reconstruction technique. CONTRAST:  75mL OMNIPAQUE IOHEXOL 350 MG/ML SOLN COMPARISON:  04/25/2023 FINDINGS: VASCULAR Aorta: Aortic atherosclerosis.  No aneurysm or dissection. Celiac: Patent without evidence of aneurysm, dissection, vasculitis or significant  stenosis. SMA: Patent without evidence of aneurysm, dissection, vasculitis or significant stenosis. Renals: Small caliber bilaterally. IMA: Patent without evidence of aneurysm, dissection, vasculitis or significant stenosis. Inflow: Atherosclerotic calcifications.  No aneurysm or dissection. Proximal Outflow: Atherosclerotic calcifications. No aneurysm or dissection or significant stenosis. Veins: No obvious venous abnormality within the limitations of this arterial phase study. Review of the MIP images confirms the above findings. NON-VASCULAR Lower chest: Cardiomegaly.  No acute findings. Hepatobiliary: Innumerable cysts throughout the liver, stable. Gallbladder unremarkable. No biliary ductal dilatation. Pancreas: No focal abnormality or ductal dilatation. Spleen: Stable 1.7 cm low-density lesion most compatible with cyst. Adrenals/Urinary Tract: Normal adrenal glands. Enlarged kidneys with innumerable bilateral cysts compatible with polycystic kidney disease, unchanged. No hydronephrosis. Urinary bladder unremarkable. Stomach/Bowel: Stomach, large and small bowel grossly unremarkable. No contrast extravasation to localize GI bleed. Lymphatic: No adenopathy Reproductive: Prior hysterectomy.  No adnexal masses. Other: No free fluid or free air. Musculoskeletal: No acute bony abnormality. IMPRESSION: VASCULAR Aortoiliac atherosclerosis.  No aneurysm or dissection. No visible contrast extravasation to localize GI  bleed. NON-VASCULAR Changes of polycystic kidney disease with enlarged kidneys bilaterally and innumerable hepatic and renal cysts, unchanged. No acute findings. Electronically Signed   By: Janeece Mechanic M.D.   On: 05/19/2023 19:16      Rosena Conradi, MD  [facility] 05/21/2023  If 7PM-7AM, please contact night-coverage

## 2023-05-21 NOTE — Transfer of Care (Signed)
 Immediate Anesthesia Transfer of Care Note  Patient: Kendra Clay  Procedure(s) Performed: COLONOSCOPY EGD, WITH ARGON PLASMA COAGULATION  Patient Location: PACU and Endoscopy Unit  Anesthesia Type:MAC  Level of Consciousness: drowsy  Airway & Oxygen Therapy: Patient Spontanous Breathing  Post-op Assessment: Report given to RN and Post -op Vital signs reviewed and stable  Post vital signs: Reviewed and stable  Last Vitals:  Vitals Value Taken Time  BP 149/78 05/21/23 1110  Temp    Pulse 64 05/21/23 1114  Resp 26 05/21/23 1114  SpO2 95 % 05/21/23 1114  Vitals shown include unfiled device data.  Last Pain:  Vitals:   05/21/23 1110  TempSrc:   PainSc: 0-No pain         Complications: No notable events documented.

## 2023-05-21 NOTE — Op Note (Addendum)
 [facility] Patient Name: Kendra Clay Procedure Date : 05/21/2023 MRN: [id number] Attending MD: Particia Lather , , [id number] Date of Birth: 1960-11-17 CSN: [id number] Age: 63 Admit Type: Inpatient Procedure:                Colonoscopy Indications:              Rectal bleeding, Anemia Providers:                Madelyn Brunner" Golden Hurter, RN, Priscella Mann, Technician Referring MD:             Hospitalist team Medicines:                Monitored Anesthesia Care Complications:            No immediate complications. Estimated Blood Loss:     Estimated blood loss was minimal. Procedure:                Pre-Anesthesia Assessment:                           - Prior to the procedure, a History and Physical                            was performed, and patient medications and                            allergies were reviewed. The patient's tolerance of                            previous anesthesia was also reviewed. The risks                            and benefits of the procedure and the sedation                            options and risks were discussed with the patient.                            All questions were answered, and informed consent                            was obtained. Prior Anticoagulants: The patient has                            taken no anticoagulant or antiplatelet agents. ASA                            Grade Assessment: III - A patient with severe                            systemic disease. After reviewing the risks and  benefits, the patient was deemed in satisfactory                            condition to undergo the procedure.                           After obtaining informed consent, the colonoscope                            was passed under direct vision. Throughout the                            procedure, the patient's blood pressure, pulse, and                             oxygen saturations were monitored continuously. The                            [id number]-HQ190L ([id number]) Olympus colonoscope was                            introduced through the anus and advanced to the the                            terminal ileum. The colonoscopy was performed                            without difficulty. The patient tolerated the                            procedure well. The quality of the bowel                            preparation was excellent. The terminal ileum,                            ileocecal valve, appendiceal orifice, and rectum                            were photographed. Scope In: 10:35:59 AM Scope Out: 10:50:07 AM Scope Withdrawal Time: 0 hours 11 minutes 7 seconds  Total Procedure Duration: 0 hours 14 minutes 8 seconds  Findings:      The terminal ileum appeared normal.      Three localized angioectasias without bleeding were found in the       ascending colon. Coagulation for bleeding prevention using argon plasma       at 1 liter/minute and 20 watts was successful.      Non-bleeding internal hemorrhoids were found during retroflexion. Impression:               - The examined portion of the ileum was normal.                           - Three non-bleeding colonic angioectasias. Treated  with argon plasma coagulation (APC).                           - Non-bleeding internal hemorrhoids.                           - No specimens collected. Recommendation:           - Return patient to hospital ward for ongoing care.                           - Patient had yellow stool on exam today. No signs                            of active bleeding. Per nursing, there were reports                            of large amounts of blood coming from her urethra,                            which is the more likely source of her anemia.                           - Repeat colonoscopy in 10 years for colon cancer                             screening.                           - Start folic acid supplements to help with anemia.                           - The findings and recommendations were discussed                            with the patient. Procedure Code(s):        --- Professional ---                           [phone], Colonoscopy, flexible; with control of                            bleeding, any method Diagnosis Code(s):        --- Professional ---                           K64.8, Other hemorrhoids                           K55.20, Angiodysplasia of colon without hemorrhage                           K62.5, Hemorrhage of anus and rectum CPT copyright 2022 American Medical Association. All rights reserved. The codes documented in this report are preliminary and upon coder review may  be revised to meet current compliance requirements. Dr  Pedro Bourgeois "Anastacio Balm" Rosaline Coma,  05/21/2023 11:00:19 AM Number of Addenda: 0

## 2023-05-21 NOTE — TOC CM/SW Note (Signed)
 Transition of Care [facility]) - Inpatient Brief Assessment   Patient Details  Name: Kendra Clay MRN: [id number] Date of Birth: 1961/01/27  Transition of Care [facility]) CM/SW Contact:    Tom-Johnson, Angelique Ken, RN Phone Number: 05/21/2023, 9:35 AM   Clinical Narrative:  Patient presented to the ED with bright red blood in Urine and Stool. Hgb on admission was 9.4. Patient scheduled for Colonoscopy tomorrow, GI following.  Patient has hx of ESRD on MWF outpatient HD, Nephrology following for inpatient HD.  From home with son, has five supportive siblings. Not employed, on disability. Independent with care and drive self to and from outpatient HD. Has a walker and shower seat at home.  Patient does not have a PCP, request sent to CMA to assist with scheduling.   Patient not Medically ready for discharge.  CM will continue to follow as patient progresses with care towards discharge.          Transition of Care Asessment:   Patient has primary care physician: No (Will schedule when Medically ready for discharge.) Home environment has been reviewed: Yes Prior level of function:: Modified Independent Prior/Current Home Services: No current home services Social Drivers of Health Review: SDOH reviewed no interventions necessary Readmission risk has been reviewed: Yes Transition of care needs: transition of care needs identified, TOC will continue to follow

## 2023-05-21 NOTE — Progress Notes (Addendum)
 Pt assisted to [facility].  Pt could not take steps forward or to the side. Urinated bloody urine.  Pt states she has been urinating urine this color.  Endo aware prior to colonoscopy today at 11am.

## 2023-05-21 NOTE — Progress Notes (Signed)
   05/21/23 0200  Vitals  Temp 97.6 F (36.4 C)  Temp Source Axillary  BP (!) 178/85  MAP (mmHg) 111  BP Location Left Arm  BP Method Automatic  Patient Position (if appropriate) Lying  Pulse Rate 80  ECG Heart Rate 82  Resp (!) 23  Oxygen Therapy  SpO2 100 %  O2 Device Nasal Cannula  O2 Flow Rate (L/min) 2 L/min  During Treatment Monitoring  Blood Flow Rate (mL/min) 0 mL/min  Arterial Pressure (mmHg) -0.8 mmHg  Venous Pressure (mmHg) -1.61 mmHg  TMP (mmHg) -50.1 mmHg  Ultrafiltration Rate (mL/min) 67 mL/min  Dialysate Flow Rate (mL/min) 0 ml/min  Dialysate Potassium Concentration 3  Dialysate Calcium Concentration 2.5  Duration of HD Treatment -hour(s) 2.14 hour(s)  Cumulative Fluid Removed (mL) per Treatment  441.93  Intra-Hemodialysis Comments Tx completed  Post Treatment  Dialyzer Clearance Lightly streaked  Liters Processed 42.7  Fluid Removed (mL) 400 mL  Tolerated HD Treatment Yes  Post-Hemodialysis Comments  (Pt request early off)  AVG/AVF Arterial Site Held (minutes) 10 minutes  AVG/AVF Venous Site Held (minutes) 10 minutes  Note  Patient Observations  (Pt experiencing Diarrhea, liquid stool.)  Fistula / Graft Right Forearm  Placement Date/Time: (c)  (c)    Orientation: Right  Access Location: Forearm  Site Condition No complications  Fistula / Graft Assessment Present;Thrill;Bruit  Status Deaccessed  Drainage Description None   Pt lost approximately stool liquid. Pt is in preparation for colonoscopy. Pt request to end treatment 1hour early.

## 2023-05-21 NOTE — Anesthesia Postprocedure Evaluation (Signed)
 Anesthesia Post Note  Patient: Kendra Clay  Procedure(s) Performed: COLONOSCOPY EGD, WITH ARGON PLASMA COAGULATION     Patient location during evaluation: PACU Anesthesia Type: MAC Level of consciousness: awake Pain management: pain level controlled Vital Signs Assessment: post-procedure vital signs reviewed and stable Respiratory status: spontaneous breathing, nonlabored ventilation and respiratory function stable Cardiovascular status: stable and blood pressure returned to baseline Postop Assessment: no apparent nausea or vomiting Anesthetic complications: no   No notable events documented.  Last Vitals:  Vitals:   05/21/23 1110 05/21/23 1120  BP: (!) 149/78 (!) 162/79  Pulse: 65 65  Resp: 19 16  Temp:    SpO2: 92% 100%    Last Pain:  Vitals:   05/21/23 1120  TempSrc:   PainSc: 0-No pain                 Conard Decent

## 2023-05-21 NOTE — Interval H&P Note (Signed)
 History and Physical Interval Note:  05/21/2023 9:13 AM  Kendra Clay  has presented today for surgery, with the diagnosis of Blood in stool.  The various methods of treatment have been discussed with the patient and family. After consideration of risks, benefits and other options for treatment, the patient has consented to  Procedure(s): COLONOSCOPY (N/A) as a surgical intervention.  The patient's history has been reviewed, patient examined, no change in status, stable for surgery.  I have reviewed the patient's chart and labs.  Questions were answered to the patient's satisfaction.     Reese Senk C Chakara Bognar

## 2023-05-21 NOTE — Plan of Care (Signed)
   Problem: Education: Goal: Knowledge of General Education information will improve Description: Including pain rating scale, medication(s)/side effects and non-pharmacologic comfort measures Outcome: Progressing   Problem: Clinical Measurements: Goal: Respiratory complications will improve Outcome: Progressing   Problem: Nutrition: Goal: Adequate nutrition will be maintained Outcome: Progressing

## 2023-05-21 NOTE — Consult Note (Signed)
 Urology Consult Note   Requesting Attending Physician:  Joycelyn Das, MD Service Providing Consult: Urology  Consulting Attending: Dr. Mena Goes   Reason for Consult: Hematuria  HPI: Kendra Clay is seen in consultation for reasons noted above at the request of Pokhrel, Rebekah Chesterfield, MD. patient is a 63 year old female with PCKD, ESRD on dialysis, HFmrEF, polycystic liver disease, anemia of chronic disease, chronic thrombocytopenia, HTN,, and CAD-s/p PCI to LAD in 2017.  [facility] was consulted to speak to blood in urine, originally thought to be blood in stool, after inconclusive colonoscopy.  On my arrival patient was resting comfortably in bed.  She reports that she has had hematuria before, an expected finding.  She makes very little urine at baseline due to dialysis.  She denies pain, burning, frequency, urgency, or any other lower urinary tract symptoms.  All questions were answered to her satisfaction.  ------------------  Assessment:   63 y.o. female 63 year old with moderate hematuria in the context of polycystic kidney disease.  She has experienced this before.     Recommendations: #Gross hematuria # PCKD  Hematuria is a frequent finding in polycystic kidney disease.  She has likely bled into a cyst that is communicating with her collecting system.  This is clinically insignificant.  Her hemoglobin has improved since yesterday.  No acute urologic intervention is indicated  Difficult to quantify as there are no samples available. Continue to trend labs Urology will not need to follow.  Case and plan discussed with respiratory  Past Medical History: Past Medical History:  Diagnosis Date   Complication of anesthesia    Enlarged liver    secondary to PKD   ESRD on hemodialysis (HCC)    MWF    Heart murmur    "slight" per ? Dr Arlyce Dice 30 years ago. 2D ECHO  30 yearsa go.   History of blood transfusion    C- Section   History of bronchitis    numerous,  last time> 1 year   Hypertension    Night muscle spasms    legs   Polycystic kidney disease    Genetic dx 23 years ago   PONV (postoperative nausea and vomiting)    patch helped   Scoliosis    Strabismus     Past Surgical History:  Past Surgical History:  Procedure Laterality Date   A/V FISTULAGRAM Right 07/10/2022   Procedure: A/V Fistulagram;  Surgeon: Cephus Shelling, MD;  Location: [facility] INVASIVE CV LAB;  Service: Cardiovascular;  Laterality: Right;   A/V SHUNT INTERVENTION N/A 04/30/2023   Procedure: A/V SHUNT INTERVENTION;  Surgeon: Ethelene Hal, MD;  Location: [facility] INVASIVE CV LAB;  Service: Cardiovascular;  Laterality: N/A;   ABDOMINAL HYSTERECTOMY     AV FISTULA PLACEMENT  09/01/2011   Procedure: ARTERIOVENOUS (AV) FISTULA CREATION;  Surgeon: Larina Earthly, MD;  Location: [facility] OR;  Service: Vascular;  Laterality: Right;   CESAREAN SECTION  1997   EYE SURGERY     for lazy eye   ORIF FEMUR FRACTURE Left 08/29/2021   Procedure: OPEN REDUCTION INTERNAL FIXATION (ORIF) DISTAL FEMUR FRACTURE;  Surgeon: Roby Lofts, MD;  Location: [facility] OR;  Service: Orthopedics;  Laterality: Left;   ORIF WRIST FRACTURE Left 08/29/2021   Procedure: OPEN REDUCTION INTERNAL FIXATION (ORIF) WRIST FRACTURE;  Surgeon: Roby Lofts, MD;  Location: [facility] OR;  Service: Orthopedics;  Laterality: Left;   PERIPHERAL VASCULAR BALLOON ANGIOPLASTY  07/10/2022   Procedure: PERIPHERAL VASCULAR BALLOON ANGIOPLASTY;  Surgeon: Sherald Hess  J, MD;  Location: [facility] INVASIVE CV LAB;  Service: Cardiovascular;;   TIBIA IM NAIL INSERTION Left 05/19/2018   Procedure: INTRAMEDULLARY (IM) NAIL TIBIAL;  Surgeon: Bjorn Pippin, MD;  Location: [facility] OR;  Service: Orthopedics;  Laterality: Left;   TIBIA IM NAIL INSERTION Right 08/29/2021   Procedure: INTRAMEDULLARY (IM) NAIL TIBIAL WITH IRRIGATION AND DEBRIDMENT;  Surgeon: Roby Lofts, MD;  Location: [facility] OR;  Service: Orthopedics;  Laterality: Right;   VENOUS ANGIOPLASTY Right 04/30/2023    Procedure: VENOUS ANGIOPLASTY;  Surgeon: Ethelene Hal, MD;  Location: [facility] INVASIVE CV LAB;  Service: Cardiovascular;  Laterality: Right;  cephalic arch in stent    Medication: Current Facility-Administered Medications  Medication Dose Route Frequency Provider Last Rate Last Admin   acetaminophen (TYLENOL) tablet 650 mg  650 mg Oral Q6H PRN Charlsie Quest, MD       Or   acetaminophen (TYLENOL) suppository 650 mg  650 mg Rectal Q6H PRN Charlsie Quest, MD       atorvastatin (LIPITOR) tablet 40 mg  40 mg Oral QHS Darreld Mclean R, MD   40 mg at 05/19/23 2159   cefTRIAXone (ROCEPHIN) 1 g in sodium chloride 0.9 % 100 mL IVPB  1 g Intravenous Q24H Pokhrel, Rebekah Chesterfield, MD   Stopped at 05/21/23 1610   Chlorhexidine Gluconate Cloth 2 % PADS 6 each  6 each Topical Q0600 Anthony Sar, MD       cinacalcet (SENSIPAR) tablet 30 mg  30 mg Oral Q M,W,F-HD Anthony Sar, MD   30 mg at 05/20/23 1952   cyanocobalamin (VITAMIN B12) tablet 1,000 mcg  1,000 mcg Oral Daily Pokhrel, Laxman, MD       doxercalciferol (HECTOROL) injection 5 mcg  5 mcg Intravenous Q M,W,F-HD Anthony Sar, MD   5 mcg at 05/21/23 9604   folic acid (FOLVITE) tablet 1 mg  1 mg Oral Daily Imogene Burn, MD   1 mg at 05/21/23 1301   isosorbide mononitrate (IMDUR) 24 hr tablet 30 mg  30 mg Oral Daily Darreld Mclean R, MD   30 mg at 05/21/23 0834   losartan (COZAAR) tablet 50 mg  50 mg Oral Daily Darreld Mclean R, MD   50 mg at 05/21/23 0834   metoprolol succinate (TOPROL-XL) 24 hr tablet 25 mg  25 mg Oral Daily Darreld Mclean R, MD   25 mg at 05/21/23 0834   ondansetron (ZOFRAN) tablet 4 mg  4 mg Oral Q6H PRN Charlsie Quest, MD       Or   ondansetron (ZOFRAN) injection 4 mg  4 mg Intravenous Q6H PRN Darreld Mclean R, MD   4 mg at 05/20/23 1948   pantoprazole (PROTONIX) EC tablet 40 mg  40 mg Oral BID Darreld Mclean R, MD   40 mg at 05/21/23 0834   senna-docusate (Senokot-S) tablet 1 tablet  1 tablet Oral QHS PRN Darreld Mclean R, MD       sodium  chloride flush (NS) 0.9 % injection 3 mL  3 mL Intravenous Q12H Charlsie Quest, MD   3 mL at 05/21/23 0335    Allergies: Allergies  Allergen Reactions   E-Mycin [Erythromycin] Hives   Lisinopril Cough   Sulfa Antibiotics Rash    Skin Rash      Social History: Social History   Tobacco Use   Smoking status: Former    Current packs/day: 0.00    Average packs/day: 1.5 packs/day for 34.9 years (52.4 ttl pk-yrs)  Types: Cigarettes    Start date: 49    Quit date: 01/10/2015    Years since quitting: 8.3   Smokeless tobacco: Former    Quit date: 01/09/2014  Vaping Use   Vaping status: Never Used  Substance Use Topics   Alcohol use: No    Alcohol/week: 0.0 standard drinks of alcohol   Drug use: No    Family History Family History  Problem Relation Age of Onset   Arthritis Mother    Kidney disease Father    Polycystic kidney disease Son    Asthma Son    Hypertension Maternal Grandmother     Review of Systems  Genitourinary:  Positive for hematuria. Negative for dysuria, flank pain, frequency and urgency.     Objective   Vital signs in last 24 hours: BP (!) 156/97   Pulse 71   Temp (!) 97.1 F (36.2 C) (Temporal)   Resp 16   Ht (P) 5\' 3"  (1.6 m)   Wt 54 kg   SpO2 100%   BMI (P) 21.09 kg/m   Physical Exam General: A&O, resting, appropriate HEENT: [place]/AT Pulmonary: Normal work of breathing Cardiovascular: no cyanosis   Most Recent Labs: Lab Results  Component Value Date   WBC 3.8 (L) 05/21/2023   HGB 9.1 (L) 05/21/2023   HCT 28.5 (L) 05/21/2023   PLT 59 (L) 05/21/2023    Lab Results  Component Value Date   NA 135 05/21/2023   K 4.0 05/21/2023   CL 94 (L) 05/21/2023   CO2 28 05/21/2023   BUN 11 05/21/2023   CREATININE 4.42 (H) 05/21/2023   CALCIUM 8.7 (L) 05/21/2023   MG 1.7 05/21/2023   PHOS 3.1 01/31/2023    Lab Results  Component Value Date   INR 1.2 08/29/2021     Urine Culture: @[id number] (laburin,org,[id number],[id number])@    IMAGING: US RENAL Result Date: 05/19/2023 CLINICAL DATA:  Hematuria EXAM: RENAL / URINARY TRACT ULTRASOUND COMPLETE COMPARISON:  None Available. FINDINGS: Right Kidney: Renal measurements: 19.1 x 10.2 x 11.6 cm. = volume: 1181 mL. Innumerable cysts are noted throughout the right kidney consistent with the previous findings of CT examination. No mass lesion is noted. Left Kidney: Renal measurements: 17.9 x 9.6 x 13.7 cm. = volume: 1233 mL. Innumerable cysts are noted similar to that seen on the right. No obstructive changes are noted. Bladder: Bladder is well distended. Echogenic focus is noted measuring 2.1 x 1.0 x 1.9 cm. Given the patient's clinical history of hematuria this likely represents thrombus. It was not well appreciated on the recent CT. Other: None. IMPRESSION: Changes consistent with polycystic kidney disease. Echogenic focus within the urinary bladder likely representing thrombus. Electronically Signed   By: Alcide Clever M.D.   On: 05/19/2023 23:59   CT Angio Abd/Pel W and/or Wo Contrast Result Date: 05/19/2023 CLINICAL DATA:  Lower GI bleed EXAM: CTA ABDOMEN AND PELVIS WITHOUT AND WITH CONTRAST TECHNIQUE: Multidetector CT imaging of the abdomen and pelvis was performed using the standard protocol during bolus administration of intravenous contrast. Multiplanar reconstructed images and MIPs were obtained and reviewed to evaluate the vascular anatomy. RADIATION DOSE REDUCTION: This exam was performed according to the departmental dose-optimization program which includes automated exposure control, adjustment of the mA and/or kV according to patient size and/or use of iterative reconstruction technique. CONTRAST:  75mL OMNIPAQUE IOHEXOL 350 MG/ML SOLN COMPARISON:  04/25/2023 FINDINGS: VASCULAR Aorta: Aortic atherosclerosis.  No aneurysm or dissection. Celiac: Patent without evidence of aneurysm, dissection, vasculitis or significant stenosis. SMA:  Patent without evidence of aneurysm,  dissection, vasculitis or significant stenosis. Renals: Small caliber bilaterally. IMA: Patent without evidence of aneurysm, dissection, vasculitis or significant stenosis. Inflow: Atherosclerotic calcifications.  No aneurysm or dissection. Proximal Outflow: Atherosclerotic calcifications. No aneurysm or dissection or significant stenosis. Veins: No obvious venous abnormality within the limitations of this arterial phase study. Review of the MIP images confirms the above findings. NON-VASCULAR Lower chest: Cardiomegaly.  No acute findings. Hepatobiliary: Innumerable cysts throughout the liver, stable. Gallbladder unremarkable. No biliary ductal dilatation. Pancreas: No focal abnormality or ductal dilatation. Spleen: Stable 1.7 cm low-density lesion most compatible with cyst. Adrenals/Urinary Tract: Normal adrenal glands. Enlarged kidneys with innumerable bilateral cysts compatible with polycystic kidney disease, unchanged. No hydronephrosis. Urinary bladder unremarkable. Stomach/Bowel: Stomach, large and small bowel grossly unremarkable. No contrast extravasation to localize GI bleed. Lymphatic: No adenopathy Reproductive: Prior hysterectomy.  No adnexal masses. Other: No free fluid or free air. Musculoskeletal: No acute bony abnormality. IMPRESSION: VASCULAR Aortoiliac atherosclerosis.  No aneurysm or dissection. No visible contrast extravasation to localize GI bleed. NON-VASCULAR Changes of polycystic kidney disease with enlarged kidneys bilaterally and innumerable hepatic and renal cysts, unchanged. No acute findings. Electronically Signed   By: Janeece Mechanic M.D.   On: 05/19/2023 19:16    ------  Alla Ar, NP Pager: [phone]   Please contact the urology consult pager with any further questions/concerns.

## 2023-05-21 NOTE — Anesthesia Preprocedure Evaluation (Signed)
 Anesthesia Evaluation  Patient identified by MRN, date of birth, ID band Patient awake    Reviewed: Allergy & Precautions, NPO status , Patient's Chart, lab work & pertinent test results  History of Anesthesia Complications (+) PONV and history of anesthetic complications  Airway Mallampati: II  TM Distance: >3 FB     Dental  (+) Edentulous Upper, Missing, Dental Advisory Given,    Pulmonary COPD,  COPD inhaler, former smoker   Pulmonary exam normal breath sounds clear to auscultation       Cardiovascular hypertension, Pt. on medications + CAD and +CHF  + Valvular Problems/Murmurs  Rhythm:Regular Rate:Normal + Systolic murmurs    Neuro/Psych negative neurological ROS  negative psych ROS   GI/Hepatic Neg liver ROS,GERD  Medicated,,  Endo/Other    Renal/GU ESRF and DialysisRenal diseaseLast dialysis - this am finished 0100  negative genitourinary   Musculoskeletal negative musculoskeletal ROS (+)    Abdominal   Peds  Hematology  (+) Blood dyscrasia, anemia   Anesthesia Other Findings   Reproductive/Obstetrics                             Anesthesia Physical Anesthesia Plan  ASA: 4  Anesthesia Plan: MAC   Post-op Pain Management: Minimal or no pain anticipated   Induction: Intravenous  PONV Risk Score and Plan:   Airway Management Planned: Natural Airway and Simple Face Mask  Additional Equipment: None  Intra-op Plan:   Post-operative Plan:   Informed Consent: I have reviewed the patients History and Physical, chart, labs and discussed the procedure including the risks, benefits and alternatives for the proposed anesthesia with the patient or authorized representative who has indicated his/her understanding and acceptance.     Dental advisory given  Plan Discussed with: CRNA and Anesthesiologist  Anesthesia Plan Comments:        Anesthesia Quick Evaluation

## 2023-05-22 DIAGNOSIS — K922 Gastrointestinal hemorrhage, unspecified: Secondary | ICD-10-CM | POA: Diagnosis not present

## 2023-05-22 LAB — CBC
HCT: 27.5 % — ABNORMAL LOW (ref 36.0–46.0)
Hemoglobin: 8.7 g/dL — ABNORMAL LOW (ref 12.0–15.0)
MCH: 31.5 pg (ref 26.0–34.0)
MCHC: 31.6 g/dL (ref 30.0–36.0)
MCV: 99.6 fL (ref 80.0–100.0)
Platelets: 59 10*3/uL — ABNORMAL LOW (ref 150–400)
RBC: 2.76 MIL/uL — ABNORMAL LOW (ref 3.87–5.11)
RDW: 18.3 % — ABNORMAL HIGH (ref 11.5–15.5)
WBC: 4.1 10*3/uL (ref 4.0–10.5)
nRBC: 0 % (ref 0.0–0.2)

## 2023-05-22 LAB — BASIC METABOLIC PANEL WITH GFR
Anion gap: 10 (ref 5–15)
BUN: 19 mg/dL (ref 8–23)
CO2: 28 mmol/L (ref 22–32)
Calcium: 8.7 mg/dL — ABNORMAL LOW (ref 8.9–10.3)
Chloride: 97 mmol/L — ABNORMAL LOW (ref 98–111)
Creatinine, Ser: 6.93 mg/dL — ABNORMAL HIGH (ref 0.44–1.00)
GFR, Estimated: 6 mL/min — ABNORMAL LOW (ref >60)
Glucose, Bld: 103 mg/dL — ABNORMAL HIGH (ref 70–99)
Potassium: 4.2 mmol/L (ref 3.5–5.1)
Sodium: 135 mmol/L (ref 135–145)

## 2023-05-22 LAB — HEPATITIS B SURFACE ANTIBODY, QUANTITATIVE: Hep B S AB Quant (Post): 40.9 m[IU]/mL

## 2023-05-22 MED ORDER — NEPRO/CARBSTEADY PO LIQD: 237.0000 mL | ORAL | Status: DC | PRN

## 2023-05-22 MED ORDER — HEPARIN SODIUM (PORCINE) 1000 UNIT/ML DIALYSIS: 1000.0000 [IU] | INTRAMUSCULAR | Status: DC | PRN

## 2023-05-22 MED ORDER — ANTICOAGULANT SODIUM CITRATE 4% (200MG/5ML) IV SOLN: 5.0000 mL | Status: DC | PRN

## 2023-05-22 MED ORDER — ALTEPLASE 2 MG IJ SOLR: 2.0000 mg | Freq: Once | INTRAMUSCULAR | Status: DC | PRN

## 2023-05-22 MED ORDER — LIDOCAINE-PRILOCAINE 2.5-2.5 % EX CREA
1.0000 | TOPICAL_CREAM | CUTANEOUS | Status: DC | PRN
Start: 2023-05-22 — End: 2023-05-22

## 2023-05-22 MED ORDER — PENTAFLUOROPROP-TETRAFLUOROETH EX AERO: 1.0000 | INHALATION_SPRAY | CUTANEOUS | Status: DC | PRN

## 2023-05-22 MED ORDER — LIDOCAINE HCL (PF) 1 % IJ SOLN: 5.0000 mL | INTRAMUSCULAR | Status: DC | PRN

## 2023-05-22 NOTE — Evaluation (Signed)
 Physical Therapy Evaluation Patient Details Name: Kendra Clay MRN: [id number] DOB: [birth year] Today's Date: 05/22/2023  History of Present Illness  63 y.o. female presents to [facility] hospital on 05/19/2023 with blood in urine and stool. Colonoscopy on 4/17. PMH includes CAD, HFmrEF, ESRD on HD, polycystic liver and kidney disease, HTN, HLD.  Clinical Impression  Pt presents to PT with deficits in functional mobility, strength, power, activity tolerance. Pt reports L knee pain and weakness which began on 05/13/2023. Pt reports the pain has been improving since this time but is still hindering mobility. Pt has needed assistance from her son to transfer over the last week. Pt currently requires minA from PT to stand, she does take one step forward without physical assistance but states she is unable to progress further due to pain.weakness. PT provides education on HEP to improve ROM and strength. PT recommends the pt perform stand pivot transfers to wheelchair with continued assistance from her son. HHPT is recommended to progress ambulation tolerance. Pt has all DME needed at this time.        If plan is discharge home, recommend the following: A little help with walking and/or transfers;A little help with bathing/dressing/bathroom;Assistance with cooking/housework;Assist for transportation;Help with stairs or ramp for entrance   Can travel by private vehicle        Equipment Recommendations None recommended by PT  Recommendations for Other Services       Functional Status Assessment Patient has had a recent decline in their functional status and demonstrates the ability to make significant improvements in function in a reasonable and predictable amount of time.     Precautions / Restrictions Precautions Precautions: Fall Recall of Precautions/Restrictions: Intact Precaution/Restrictions Comments: L knee pain Restrictions Weight Bearing Restrictions Per Provider Order: No       Mobility  Bed Mobility Overal bed mobility: Needs Assistance Bed Mobility: Supine to Sit, Sit to Supine     Supine to sit: Supervision Sit to supine: Supervision   General bed mobility comments: increased time    Transfers Overall transfer level: Needs assistance Equipment used: Rolling walker (2 wheels) Transfers: Sit to/from Stand Sit to Stand: Min assist           General transfer comment: assist at gait belt to power up, minimal assistance    Ambulation/Gait Ambulation/Gait assistance: Contact guard assist Gait Distance (Feet): 1 Feet Assistive device: Rolling walker (2 wheels) Gait Pattern/deviations: Step-to pattern Gait velocity: reduced Gait velocity interpretation: <1.31 ft/sec, indicative of household ambulator   General Gait Details: pt takes one step forward with good stability, reports an inability to progress further due to pain and weakness  Stairs            Wheelchair Mobility     Tilt Bed    Modified Rankin (Stroke Patients Only)       Balance Overall balance assessment: Needs assistance Sitting-balance support: No upper extremity supported, Feet supported Sitting balance-Leahy Scale: Good     Standing balance support: Single extremity supported, Reliant on assistive device for balance Standing balance-Leahy Scale: Poor                               Pertinent Vitals/Pain Pain Assessment Pain Assessment: 0-10 Pain Score: 1  Pain Location: L knee (at rest) Pain Descriptors / Indicators: Aching Pain Intervention(s): Limited activity within patient's tolerance    Home Living Family/patient expects to be discharged to:: Private residence Living Arrangements:  Children Available Help at Discharge: Family;Available 24 hours/day (son out of town today, coming back tomorrow) Type of Home: House Home Access: Ramped entrance       Home Layout: One level Home Equipment: Agricultural Consultant (2 wheels);Wheelchair -  manual;Shower seat;BSC/3in1;Hospital bed      Prior Function Prior Level of Function : Needs assist             Mobility Comments: pt typically ambulates for household distances with RW, has been having L knee pain since 05/13/2023 which has prevented her from walking. Pt reports the pain is getting better ADLs Comments: independent with ADLs, light IADLs, and driving typically, has been needing consistent assistance since onset of L knee pain     Extremity/Trunk Assessment   Upper Extremity Assessment Upper Extremity Assessment: Generalized weakness    Lower Extremity Assessment Lower Extremity Assessment: Generalized weakness (LLE grossly 4/5, ~lack of active L knee extension by ~5 degrees, PROM WFL)    Cervical / Trunk Assessment Cervical / Trunk Assessment: Normal  Communication   Communication Communication: No apparent difficulties    Cognition Arousal: Alert Behavior During Therapy: WFL for tasks assessed/performed   PT - Cognitive impairments: No apparent impairments                         Following commands: Intact       Cueing Cueing Techniques: Verbal cues     General Comments General comments (skin integrity, edema, etc.): VSS on RA    Exercises General Exercises - Lower Extremity Quad Sets: AROM, Both, 5 reps Long Arc Quad: AROM, Both, 5 reps Heel Slides: AROM, Both, 5 reps Other Exercises Other Exercises: instruction provided on bridging exercise in supine   Assessment/Plan    PT Assessment Patient needs continued PT services  PT Problem List Decreased strength;Decreased activity tolerance;Decreased balance;Decreased mobility;Decreased knowledge of use of DME;Pain       PT Treatment Interventions DME instruction;Gait training;Functional mobility training;Therapeutic activities;Therapeutic exercise;Balance training;Neuromuscular re-education;Patient/family education;Wheelchair mobility training    PT Goals (Current goals can be  found in the Care Plan section)  Acute Rehab PT Goals Patient Stated Goal: to improve ambulation tolerance PT Goal Formulation: With patient Time For Goal Achievement: 06/05/23 Potential to Achieve Goals: Fair Additional Goals Additional Goal #1: Pt will mobilize in a manual wheelchair for >50' at a modI level to demonstrate independence in household mobility    Frequency Min 2X/week     Co-evaluation               AM-PAC PT 6 Clicks Mobility  Outcome Measure Help needed turning from your back to your side while in a flat bed without using bedrails?: A Little Help needed moving from lying on your back to sitting on the side of a flat bed without using bedrails?: A Little Help needed moving to and from a bed to a chair (including a wheelchair)?: A Little Help needed standing up from a chair using your arms (e.g., wheelchair or bedside chair)?: A Little Help needed to walk in hospital room?: A Lot Help needed climbing 3-5 steps with a railing? : Total 6 Click Score: 15    End of Session Equipment Utilized During Treatment: Gait belt Activity Tolerance: Patient limited by pain Patient left: in bed;with call bell/phone within reach;with bed alarm set Nurse Communication: Mobility status PT Visit Diagnosis: Other abnormalities of gait and mobility (R26.89);Muscle weakness (generalized) (M62.81)    Time: 8689-8664 PT Time Calculation (min) (  ACUTE ONLY): 25 min   Charges:   PT Evaluation $PT Eval Low Complexity: 1 Low   PT General Charges $$ ACUTE PT VISIT: 1 Visit         Bernardino JINNY Ruth, PT, DPT Acute Rehabilitation Office [phone]   Bernardino JINNY Ruth 05/22/2023, 1:48 PM

## 2023-05-22 NOTE — Progress Notes (Signed)
 Received patient in bed to unit.  Alert and oriented.  Informed consent signed and in chart.   TX duration:3 hours  Patient tolerated well.  Transported back to the room  Alert, without acute distress.  Hand-off given to patient's nurse.   Acces

## 2023-05-22 NOTE — Progress Notes (Signed)
 [name] Progress Note   Subjective: Seen in HD today. Tolerating HD well. Minimal UF of 1L today. Denies pain at present time. Plan for discharge tomorrow when her son gets back and can help her. Urology consulted 4/17 for hematuria and deemed it to be bleeding into a cyst that is communicating with collecting system. Urology signed off. No complaints today.   Objective Vitals:   05/22/23 1030 05/22/23 1100 05/22/23 1113 05/22/23 1116  BP: (!) 152/80 127/87 (!) 96/57 119/78  Pulse: 75 67 70 69  Resp: 15 20 15 17   Temp:   97.6 F (36.4 C)   TempSrc:   Oral   SpO2: 100% 98% 100% 100%  Weight:    51.7 kg  Height:       Physical Exam General:Chronically ill appearing female in NAD Heart: S1,S2 RRR No M/R/G Lungs: CTAB. No IWOB.  Abdomen: NABS, NT Extremities:No LE edema Dialysis Access: AVF aneurysmal + T/B  Additional Objective Labs: Basic Metabolic Panel: Recent Labs  Lab 05/20/23 0453 05/21/23 0448 05/22/23 0523  NA 133* 135 135  K 4.2 4.0 4.2  CL 92* 94* 97*  CO2 28 28 28   GLUCOSE 82 90 103*  BUN 22 11 19   CREATININE 6.68* 4.42* 6.93*  CALCIUM  8.9 8.7* 8.7*   Liver Function Tests: Recent Labs  Lab 05/19/23 1540  AST 13*  ALT 7  ALKPHOS 112  BILITOT 1.4*  PROT 7.2  ALBUMIN 2.6*   No results for input(s): "LIPASE", "AMYLASE" in the last 168 hours. CBC: Recent Labs  Lab 05/19/23 1540 05/20/23 0453 05/20/23 1158 05/21/23 0448 05/22/23 0523  WBC 4.2 3.0* 4.0 3.8* 4.1  NEUTROABS 2.0  --   --   --   --   HGB 9.4* 9.0* 8.8* 9.1* 8.7*  HCT 30.1* 28.5* 27.2* 28.5* 27.5*  MCV 100.0 97.9 98.6 97.6 99.6  PLT 71* 61* 57* 59* 59*   Blood Culture    Component Value Date/Time   SDES URINE, CLEAN CATCH 05/19/2023 1953   SPECREQUEST  05/19/2023 1953    NONE Performed at [facility], [address], [place], [place] [address]    CULT 20,000 COLONIES/mL ESCHERICHIA COLI (A) 05/19/2023 1953   REPTSTATUS 05/21/2023 FINAL 05/19/2023  1953     Recent Labs    05/21/23 0448  IRON  86  TIBC 148*  FERRITIN 790*   @lablastinr3 @ Studies/Results: No results found.  Medications:    atorvastatin   40 mg Oral QHS   Chlorhexidine  Gluconate Cloth  6 each Topical Q0600   cinacalcet   30 mg Oral Q M,W,F-HD   vitamin B-12  1,000 mcg Oral Daily   doxercalciferol   5 mcg Intravenous Q M,W,F-HD   folic acid   1 mg Oral Daily   isosorbide  mononitrate  30 mg Oral Daily   losartan   50 mg Oral Daily   metoprolol  succinate  25 mg Oral Daily   pantoprazole   40 mg Oral BID   sodium chloride  flush  3 mL Intravenous Q12H     OP HD: MWF [place] 3h   B350  53.5kg    2K bath  AVF   - Heparin  1200 units  Last HD 4/14, post wt 54kg Getting to dry wt - Mircera 120 mcg q 2 weeks (next dose due 05/25/2023) - Hectorol  5 mcg three times per week - Sensipar  30mg  three times per week - Venofer  50mg  weekly     Assessment/ Plan: Acute lower GI bleed: GI consulted. Colonoscopy yesterday-unremarkable. Hb  9-10 range. Per pmd Hematuria-possible Urology consulted for hematuria - on 4/17 they deemed hematuria to be r/t bleeding into cyst and they signed off ESRD: on HD MWF. HD 05/22/2023. This patient NEVER misses HD and can be trusted to give an accurate opinion of what she needs. Will hold heparin  with hematuria. [name]  HTN: BP's are stable today post HD, cont home meds Volume: close to dry wt, euvolemic on exam. Minimal UF today of 1L w/ HD Anemia of esrd: Hb 9-10 here, follow, transfuse prn  Secondary hyperparathyroidism: CCa in range, add on phos. Cont binders w/ meals.   Kendra Clay, AGNP 05/22/2023, 12:23 PM  [employer] [phone]

## 2023-05-22 NOTE — Progress Notes (Signed)
 PROGRESS NOTE  Kendra Clay:[id number] DOB: 12-31-1960 DOA: 05/19/2023 PCP: Patient, [name]   LOS: 2 days   Brief narrative:  Kendra Clay is a 63 y.o. female with medical history significant for coronary artery disease status post PCI to LAD,  HFmrEF, ESRD on MWF HD, polycystic liver and kidney disease, anemia of chronic disease, chronic thrombocytopenia, HTN, HLD who presented to the ED for evaluation of blood in her urine and stool.  She does not make much urine at baseline due to dialysis status but had 2-3 episodes of hematuria.  In the ED, patient was hypotensive.  Hemoglobin was 9.4.  Creatinine elevated at 5.6.  FOBT was positive.  Urinalysis showed more than 50 RBCs.  CT of the abdomen and pelvis without visible contrast extravasation to localize GI bleed.  No aneurysm or dissection.  Changes of polycystic kidney disease with enlarged kidneys bilaterally and innumerable hepatic and renal cysts noted and unchanged.  No acute findings.  GI and nephrology was consulted and patient was then considered for admission to the hospital.     Assessment/Plan: Principal Problem:   Acute GI bleeding Active Problems:   Hematuria   ESRD on dialysis [facility])   Coronary artery disease involving native coronary artery of native heart without angina pectoris   Chronic heart failure with mildly reduced ejection fraction (HFmrEF, 41-49%) (HCC)   Hyperlipidemia   Essential hypertension   Anemia of chronic disease   Thrombocytopenia (HCC)   GI bleed  Acute lower GI bleeding: Presenting with a red to mooron red color blood per rectum.  CT angio without visible contrast extravasation to localize GI bleed otherwise no acute changes.  GI has seen the patient and patient underwent colonoscopy on 05/21/2023 with findings of localized angiectasia without bleeding which were coagulated with nonbleeding internal hemorrhoids.  GI without any impression regarding GI bleed and urethral bleeding was  noted.  Still on hold.  hematuria: History of polycystic kidney disease.  On hemo-dialysis. UA shows >50 RBCs with many bacteria.   urine culture showed 20,000 colonies of E. coli.  Will stop after 3-day course of Rocephin .  Renal ultrasound shows polycystic kidney disease with echogenic focus in the urinary bladder likely representing thrombus.  Urology was consulted and at this time no specific intervention has been advised except for closer monitoring.  Hemoglobin Referral is stable with no increasing redness of urine.  Will continue to monitor for 1 more day.  Anemia of chronic disease: Hemoglobin 9.4 on admission, hemoglobin today at 8.7.  Baseline 9.5-10.0.  Continue to monitor and transfuse as needed.   Vitamin B12 was low at 207.  Ferritin elevated at 790.  Folic acid  low at 4.7.  Will replace.  Check CBC in AM.  Folic acid  deficiency.  Borderline low vitamin B12.  Will replenish.   ESRD on MWF HD: No emergent dialysis needs.  Nephrology on board for hemodialysis needs.  Seen during hemodialysis.   CAD s/p PCI to LAD 2017: Stable, denies chest pain.  Holding aspirin .  Continue Toprol -XL, Imdur , atorvastatin .   Chronic HFmrEF: TTE 12/16/2022 showed EF 40-45%, moderate concentric LVH.  Compensated at this time.  Volume managed by dialysis.  Continue Toprol -XL, losartan .  Appears to be compensated   Chronic thrombocytopenia: Mild thrombocytopenia at 59K.[name]  Continue to monitor.   Hypertension: Continue losartan , Toprol -XL, Imdur .   Hyperlipidemia: Continue atorvastatin .  Hyponatremia.  Mild.  Improved.  Latest sodium of 135.  Ambulatory dysfunction as per the patient.  Patient states that she has been having trouble using her right leg.  Wishes to have therapy evaluation in the hospital.  Will get PT evaluation  DVT prophylaxis: SCDs Start: 05/19/23 2123   Disposition: Home likely on 05/23/2023.  Status is: Inpatient The patient is inpatient because:  hematuria, need for  closer monitoring, PT evaluation.    Code Status:     Code Status: Full Code  Family Communication: None at bedside  Consultants: Nephrology   GI Urology  Procedures: Colonoscopy on 05/21/2023 Hemodialysis  Anti-infectives:  Rocephin  IV  Anti-infectives (From admission, onward)    Start     Dose/Rate Route Frequency Ordered Stop   05/19/23 2200  cefTRIAXone  (ROCEPHIN ) 1 g in sodium chloride  0.9 % 100 mL IVPB        1 g 200 mL/hr over 30 Minutes Intravenous Every 24 hours 05/19/23 2133 05/21/23 2313       Subjective: Today, patient was seen and examined at bedside.  Seen during hemodialysis.  Patient stated that she has been able to eat okay.  She however complains of weakness in her leg and difficulty ambulating.  States that there is nobody at home today.  Feels insecure about disposition.   Objective: Vitals:   05/22/23 1000 05/22/23 1030  BP: (!) 149/84 (!) 152/80  Pulse: 66 75  Resp: 13 15  Temp:    SpO2: 98% 100%    Intake/Output Summary (Last 24 hours) at 05/22/2023 1051 Last data filed at 05/22/2023 0500 Gross per 24 hour  Intake 320 ml  Output 0 ml  Net 320 ml   Filed Weights   05/20/23 0328 05/20/23 2250 05/22/23 0758  Weight: (P) 54 kg 54 kg 52.3 kg   Body mass index is 20.42 kg/m (pended).   Physical Exam:  GENERAL: Patient is alert awake and oriented. Not in obvious distress.  Mildly anxious HENT: Mild pallor noted.. Pupils equally reactive to light. Oral mucosa is moist NECK: is supple, no gross swelling noted. CHEST: Clear to auscultation. No crackles or wheezes.   CVS: S1 and S2 heard, no murmur. Regular rate and rhythm.  ABDOMEN: Soft, non-tender, bowel sounds are present. EXTREMITIES: Right upper extremity fistula in place.  Bilateral knees with scar without any effusion.  No tenderness on palpation.  Able to bend okay. CNS: Cranial nerves are intact. No focal motor deficits. SKIN: warm and dry without rashes.  Data Review: I have  personally reviewed the following laboratory data and studies,  CBC: Recent Labs  Lab 05/19/23 1540 05/20/23 0453 05/20/23 1158 05/21/23 0448 05/22/23 0523  WBC 4.2 3.0* 4.0 3.8* 4.1  NEUTROABS 2.0  --   --   --   --   HGB 9.4* 9.0* 8.8* 9.1* 8.7*  HCT 30.1* 28.5* 27.2* 28.5* 27.5*  MCV 100.0 97.9 98.6 97.6 99.6  PLT 71* 61* 57* 59* 59*   Basic Metabolic Panel: Recent Labs  Lab 05/19/23 1540 05/20/23 0453 05/21/23 0448 05/22/23 0523  NA 134* 133* 135 135  K 3.7 4.2 4.0 4.2  CL 93* 92* 94* 97*  CO2 29 28 28 28   GLUCOSE 85 82 90 103*  BUN 16 22 11 19   CREATININE 5.67* 6.68* 4.42* 6.93*  CALCIUM  9.4 8.9 8.7* 8.7*  MG  --   --  1.7  --    Liver Function Tests: Recent Labs  Lab 05/19/23 1540  AST 13*  ALT 7  ALKPHOS 112  BILITOT 1.4*  PROT 7.2  ALBUMIN 2.6*   No  results for input(s): "LIPASE", "AMYLASE" in the last 168 hours. No results for input(s): "AMMONIA" in the last 168 hours. Cardiac Enzymes: No results for input(s): "CKTOTAL", "CKMB", "CKMBINDEX", "TROPONINI" in the last 168 hours. BNP (last 3 results) Recent Labs    12/29/22 1119 01/29/23 1239  BNP 2,023.8* 2,180.5*    ProBNP (last 3 results) No results for input(s): "PROBNP" in the last 8760 hours.  CBG: No results for input(s): "GLUCAP" in the last 168 hours. Recent Results (from the past 240 hours)  Urine Culture     Status: Abnormal   Collection Time: 05/19/23  7:53 PM   Specimen: Urine, Clean Catch  Result Value Ref Range Status   Specimen Description URINE, CLEAN CATCH  Final   Special Requests   Final    NONE Performed at [facility], [address], [place], [place] [address]    Culture 20,000 COLONIES/mL ESCHERICHIA COLI (A)  Final   Report Status 05/21/2023 FINAL  Final   Organism ID, Bacteria ESCHERICHIA COLI (A)  Final      Susceptibility   Escherichia coli - MIC*    AMPICILLIN 4 SENSITIVE Sensitive     CEFAZOLIN  <=4 SENSITIVE Sensitive     CEFEPIME <=0.12 SENSITIVE  Sensitive     CEFTRIAXONE  <=0.25 SENSITIVE Sensitive     CIPROFLOXACIN <=0.25 SENSITIVE Sensitive     GENTAMICIN <=1 SENSITIVE Sensitive     IMIPENEM <=0.25 SENSITIVE Sensitive     NITROFURANTOIN <=16 SENSITIVE Sensitive     TRIMETH/SULFA <=20 SENSITIVE Sensitive     AMPICILLIN/SULBACTAM <=2 SENSITIVE Sensitive     PIP/TAZO <=4 SENSITIVE Sensitive ug/mL    * 20,000 COLONIES/mL ESCHERICHIA COLI  MRSA Next Gen by PCR, Nasal     Status: None   Collection Time: 05/20/23  3:40 AM   Specimen: Nasal Mucosa; Nasal Swab  Result Value Ref Range Status   MRSA by PCR Next Gen NOT DETECTED NOT DETECTED Final    Comment: (NOTE) The GeneXpert MRSA Assay (FDA approved for NASAL specimens only), is one component of a comprehensive MRSA colonization surveillance program. It is not intended to diagnose MRSA infection nor to guide or monitor treatment for MRSA infections. Test performance is not FDA approved in patients less than 57 years old. Performed at [facility], [address] [place], [place] [address]      Studies: No results found.     Denice Cardon, MD  [facility] 05/22/2023  If 7PM-7AM, please contact night-coverage

## 2023-05-23 DIAGNOSIS — K922 Gastrointestinal hemorrhage, unspecified: Secondary | ICD-10-CM | POA: Diagnosis not present

## 2023-05-23 LAB — CBC
HCT: 28.4 % — ABNORMAL LOW (ref 36.0–46.0)
Hemoglobin: 9.1 g/dL — ABNORMAL LOW (ref 12.0–15.0)
MCH: 32 pg (ref 26.0–34.0)
MCHC: 32 g/dL (ref 30.0–36.0)
MCV: 100 fL (ref 80.0–100.0)
Platelets: 59 10*3/uL — ABNORMAL LOW (ref 150–400)
RBC: 2.84 MIL/uL — ABNORMAL LOW (ref 3.87–5.11)
RDW: 18.1 % — ABNORMAL HIGH (ref 11.5–15.5)
WBC: 4.1 10*3/uL (ref 4.0–10.5)
nRBC: 0 % (ref 0.0–0.2)

## 2023-05-23 LAB — BPAM RBC
Blood Product Expiration Date: 202505142359
Blood Product Expiration Date: 202505142359
Unit Type and Rh: 5100
Unit Type and Rh: 5100

## 2023-05-23 LAB — TYPE AND SCREEN
ABO/RH(D): O POS
Antibody Screen: NEGATIVE
Donor AG Type: NEGATIVE
Donor AG Type: NEGATIVE
Unit division: 0
Unit division: 0

## 2023-05-23 MED ORDER — FOLIC ACID 1 MG PO TABS: 1.0000 mg | ORAL_TABLET | Freq: Every day | ORAL | 0 refills | Status: AC

## 2023-05-23 MED ORDER — SENNOSIDES-DOCUSATE SODIUM 8.6-50 MG PO TABS: 1.0000 | ORAL_TABLET | Freq: Every evening | ORAL | 0 refills | Status: AC | PRN

## 2023-05-23 MED ORDER — CYANOCOBALAMIN 1000 MCG PO TABS: 1000.0000 ug | ORAL_TABLET | Freq: Every day | ORAL | 0 refills | Status: AC

## 2023-05-23 NOTE — Progress Notes (Signed)
 [facility] Progress Note   Subjective: Seen in room today. Had HD on 05/22/23. Denies pain at present time. Plan for discharge today when her son gets back and can help her. Urology consulted 4/17 for hematuria and deemed it to be bleeding into a cyst that is communicating with collecting system. Urology signed off. No complaints today.   Objective Vitals:   05/22/23 1608 05/22/23 2012 05/23/23 0454 05/23/23 0921  BP: 139/67 (!) 140/72 134/71 (!) 136/59  Pulse: 74 78 71 78  Resp: 18 17 16 18   Temp:  98.1 F (36.7 C) (!) 97.5 F (36.4 C) 98.5 F (36.9 C)  TempSrc:  Oral Oral   SpO2: 93% 97% 94% 98%  Weight:      Height:       Physical Exam General:Chronically ill appearing female in NAD Heart: S1,S2 RRR No M/R/G Lungs: CTAB. No IWOB.  Abdomen: NABS, NT Extremities:No LE edema Dialysis Access: AVF aneurysmal + T/B  Additional Objective Labs: Basic Metabolic Panel: Recent Labs  Lab 05/20/23 0453 05/21/23 0448 05/22/23 0523  NA 133* 135 135  K 4.2 4.0 4.2  CL 92* 94* 97*  CO2 28 28 28   GLUCOSE 82 90 103*  BUN 22 11 19   CREATININE 6.68* 4.42* 6.93*  CALCIUM  8.9 8.7* 8.7*   Liver Function Tests: Recent Labs  Lab 05/19/23 1540  AST 13*  ALT 7  ALKPHOS 112  BILITOT 1.4*  PROT 7.2  ALBUMIN 2.6*   No results for input(s): "LIPASE", "AMYLASE" in the last 168 hours. CBC: Recent Labs  Lab 05/19/23 1540 05/20/23 0453 05/20/23 1158 05/21/23 0448 05/22/23 0523 05/23/23 0534  WBC 4.2 3.0* 4.0 3.8* 4.1 4.1  NEUTROABS 2.0  --   --   --   --   --   HGB 9.4* 9.0* 8.8* 9.1* 8.7* 9.1*  HCT 30.1* 28.5* 27.2* 28.5* 27.5* 28.4*  MCV 100.0 97.9 98.6 97.6 99.6 100.0  PLT 71* 61* 57* 59* 59* 59*   Blood Culture    Component Value Date/Time   SDES URINE, CLEAN CATCH 05/19/2023 1953   SPECREQUEST  05/19/2023 1953    NONE Performed at [facility], [address] [place], [place] [address]    CULT 20,000 COLONIES/mL ESCHERICHIA COLI (A)  05/19/2023 1953   REPTSTATUS 05/21/2023 FINAL 05/19/2023 1953     Recent Labs    05/21/23 0448  IRON  86  TIBC 148*  FERRITIN 790*   @lablastinr3 @ Studies/Results: No results found.  Medications:    atorvastatin   40 mg Oral QHS   Chlorhexidine  Gluconate Cloth  6 each Topical Q0600   cinacalcet   30 mg Oral Q M,W,F-HD   vitamin B-12  1,000 mcg Oral Daily   doxercalciferol   5 mcg Intravenous Q M,W,F-HD   folic acid   1 mg Oral Daily   isosorbide  mononitrate  30 mg Oral Daily   losartan   50 mg Oral Daily   metoprolol  succinate  25 mg Oral Daily   pantoprazole   40 mg Oral BID   sodium chloride  flush  3 mL Intravenous Q12H     OP HD: MWF [place] 3h   B350  53.5kg    2K bath  AVF   - Heparin  1200 units  Last HD 4/14, post wt 54kg Getting to dry wt - Mircera 120 mcg q 2 weeks (next dose due 05/25/2023) - Hectorol  5 mcg three times per week - Sensipar  30mg  three times per week - Venofer  50mg  weekly  Assessment/ Plan: Acute lower GI bleed: GI consulted. Colonoscopy yesterday-unremarkable. Hb 9-10 range. Per pmd Hematuria-possible Urology consulted for hematuria - on 4/17 they deemed hematuria to be r/t bleeding into cyst and they signed off ESRD: on HD MWF. Last HD 05/22/2023. This patient NEVER misses HD and can be trusted to give an accurate opinion of what she needs. Will hold heparin  with hematuria. [name]  HTN: BP's are stable today post HD, cont home meds Volume: close to dry wt, euvolemic on exam. Minimal UF today of 1L w/ HD Anemia of esrd: Hb 9-10 here, follow, transfuse prn  Secondary hyperparathyroidism: CCa in range, add on phos. Cont binders w/ meals.   Hersey Lorenzo, AGNP 05/23/2023, 3:10 PM  [employer] [phone]

## 2023-05-23 NOTE — Discharge Summary (Signed)
 Physician Discharge Summary  Kendra Clay ZOX:[id number] DOB: Oct 06, 1960 DOA: 05/19/2023  PCP: Patient, [name]  Admit date: 05/19/2023 Discharge date: 05/23/2023  Admitted From: Home  Discharge disposition: Home with home health   Recommendations for Outpatient Follow-Up:   Follow up with your primary care provider in one week.  Check CBC, BMP, magnesium  in the next visit Follow-up with your urologist if you continue to have hematuria.  Discharge Diagnosis:   Principal Problem:   Acute GI bleeding Active Problems:   Hematuria   ESRD on dialysis [facility])   Coronary artery disease involving native coronary artery of native heart without angina pectoris   Chronic heart failure with mildly reduced ejection fraction (HFmrEF, 41-49%) (HCC)   Hyperlipidemia   Essential hypertension   Anemia of chronic disease   Thrombocytopenia (HCC)   GI bleed    Discharge Condition: Improved.  Diet recommendation: Low sodium, heart healthy.   Wound care: None.  Code status: Full.   History of Present Illness:   Kendra Clay is a 63 y.o. female with medical history significant for coronary artery disease status post PCI to LAD,  HFmrEF, ESRD on MWF HD, polycystic liver and kidney disease, anemia of chronic disease, chronic thrombocytopenia, HTN, HLD who presented to the ED for evaluation of blood in her urine and stool.  She does not make much urine at baseline due to dialysis status but had 2-3 episodes of hematuria.  In the ED patient was hypotensive.  Hemoglobin was 9.4.  Creatinine elevated at 5.6.  FOBT was positive.  Urinalysis showed more than 50 RBCs.  CT of the abdomen and pelvis without visible contrast extravasation to localize GI bleed.  No aneurysm or dissection.  Changes of polycystic kidney disease with enlarged kidneys bilaterally and innumerable hepatic and renal cysts noted and unchanged.  No acute findings.  GI and nephrology was consulted and patient was then  considered for admission to the hospital.    Hospital Course:   Following conditions were addressed during hospitalization as listed below,  Acute lower GI bleeding: Presenting with a red to mooron red color blood per rectum.  CT angio without visible contrast extravasation to localize GI bleed otherwise no acute changes.  Continue to hold aspirin .  GI has seen the patient and patient underwent colonoscopy on 05/21/2023 with findings of localized angiectasia without bleeding which were coagulated with nonbleeding internal hemorrhoids.  GI without any impression regarding GI bleed and urethral bleeding was noted.  Likely hematuria causing anemia.   hematuria: History of polycystic kidney disease.  On hemo-dialysis. UA shows >50 RBCs with many bacteria.  T urine culture showed 20,000 colonies of E. coli.  Will stop after 3-day course of Rocephin .  Renal ultrasound shows polycystic kidney disease with echogenic focus in the urinary bladder likely representing thrombus.  Discussed with nephrology and GI.  Urology followed the patient during hospitalization and stated that the hematuria was not unexpected.  Patient however had stable hemoglobin so minimal blood loss was identified.  She was advised to follow-up with her urologist if continued to have hematuria.   Anemia of chronic disease: Hemoglobin 9.4 on admission, hemoglobin today at 9.1 ..  baseline 9.5-10.0.  Has remained stable.  C.  Vitamin B12 was low at 207.  Ferritin elevated at 790.  Folic acid  low at 4.7.  Will replace vitamin B12 and folic acid  on discharge.   Folic acid  deficiency.  Borderline low vitamin B12.  Will continue folic acid  and  vitamin B12 on discharge.   ESRD on MWF HD: Seen by nephrology for hemodialysis during hospitalization.   CAD s/p PCI to LAD 2017: Stable, denies chest pain.  Aspirin  will be resumed in few days.  Continue Toprol -XL, Imdur , atorvastatin .   Chronic HFmrEF: TTE 12/16/2022 showed EF 40-45%, moderate  concentric LVH.  Compensated at this time.  Volume managed by dialysis.  Continue Toprol -XL, losartan    Chronic thrombocytopenia: Mild thrombocytopenia at 59K.  Resume aspirin  in few days..  Continue to monitor.   Hypertension: Resume losartan , Toprol -XL, Imdur .   Hyperlipidemia: Continue atorvastatin .   Hyponatremia.  Mild.  Improved.   Disposition.  At this time, patient is stable for disposition home with home health with outpatient PCP and urology follow-up.  Medical Consultants:   Nephrology Urology GI  Procedures:    Hemodialysis Colonoscopy on 05/21/2023 Subjective:   Today, patient was seen and examined at bedside.  Feels okay.  Denies any nausea vomiting fever chills or rigor.  Denies any pain, shortness of breath or dyspnea  Discharge Exam:   Vitals:   05/23/23 0454 05/23/23 0921  BP: 134/71 (!) 136/59  Pulse: 71 78  Resp: 16 18  Temp: (!) 97.5 F (36.4 C) 98.5 F (36.9 C)  SpO2: 94% 98%   Vitals:   05/22/23 1608 05/22/23 2012 05/23/23 0454 05/23/23 0921  BP: 139/67 (!) 140/72 134/71 (!) 136/59  Pulse: 74 78 71 78  Resp: 18 17 16 18   Temp:  98.1 F (36.7 C) (!) 97.5 F (36.4 C) 98.5 F (36.9 C)  TempSrc:  Oral Oral   SpO2: 93% 97% 94% 98%  Weight:      Height:       Body mass index is 20.19 kg/m (pended).  General: Alert awake, not in obvious distress HENT: pupils equally reacting to light, mild pallor noted.  Oral mucosa is moist.  Chest:  Clear breath sounds.  No crackles or wheezes.  CVS: S1 &S2 heard. No murmur.  Regular rate and rhythm. Abdomen: Soft, nontender, nondistended.  Bowel sounds are heard.   Extremities: No cyanosis, clubbing or edema.  Right upper extremity fistula in place. Psych: Alert, awake and oriented, normal mood CNS:  No cranial nerve deficits.  Moves all extremities Skin: Warm and dry.  No rashes noted.  The results of significant diagnostics from this hospitalization (including imaging, microbiology, ancillary  and laboratory) are listed below for reference.     Diagnostic Studies:   US  RENAL Result Date: 05/19/2023 CLINICAL DATA:  Hematuria EXAM: RENAL / URINARY TRACT ULTRASOUND COMPLETE COMPARISON:  None Available. FINDINGS: Right Kidney: Renal measurements: 19.1 x 10.2 x 11.6 cm. = volume: 1181 mL. Innumerable cysts are noted throughout the right kidney consistent with the previous findings of CT examination. No mass lesion is noted. Left Kidney: Renal measurements: 17.9 x 9.6 x 13.7 cm. = volume: 1233 mL. Innumerable cysts are noted similar to that seen on the right. No obstructive changes are noted. Bladder: Bladder is well distended. Echogenic focus is noted measuring 2.1 x 1.0 x 1.9 cm. Given the patient's clinical history of hematuria this likely represents thrombus. It was not well appreciated on the recent CT. Other: None. IMPRESSION: Changes consistent with polycystic kidney disease. Echogenic focus within the urinary bladder likely representing thrombus. Electronically Signed   By: Violeta Grey M.D.   On: 05/19/2023 23:59   CT Angio Abd/Pel W and/or Wo Contrast Result Date: 05/19/2023 CLINICAL DATA:  Lower GI bleed EXAM: CTA ABDOMEN AND PELVIS WITHOUT  AND WITH CONTRAST TECHNIQUE: Multidetector CT imaging of the abdomen and pelvis was performed using the standard protocol during bolus administration of intravenous contrast. Multiplanar reconstructed images and MIPs were obtained and reviewed to evaluate the vascular anatomy. RADIATION DOSE REDUCTION: This exam was performed according to the departmental dose-optimization program which includes automated exposure control, adjustment of the mA and/or kV according to patient size and/or use of iterative reconstruction technique. CONTRAST:  75mL OMNIPAQUE  IOHEXOL  350 MG/ML SOLN COMPARISON:  04/25/2023 FINDINGS: VASCULAR Aorta: Aortic atherosclerosis.  No aneurysm or dissection. Celiac: Patent without evidence of aneurysm, dissection, vasculitis or  significant stenosis. SMA: Patent without evidence of aneurysm, dissection, vasculitis or significant stenosis. Renals: Small caliber bilaterally. IMA: Patent without evidence of aneurysm, dissection, vasculitis or significant stenosis. Inflow: Atherosclerotic calcifications.  No aneurysm or dissection. Proximal Outflow: Atherosclerotic calcifications. No aneurysm or dissection or significant stenosis. Veins: No obvious venous abnormality within the limitations of this arterial phase study. Review of the MIP images confirms the above findings. NON-VASCULAR Lower chest: Cardiomegaly.  No acute findings. Hepatobiliary: Innumerable cysts throughout the liver, stable. Gallbladder unremarkable. No biliary ductal dilatation. Pancreas: No focal abnormality or ductal dilatation. Spleen: Stable 1.7 cm low-density lesion most compatible with cyst. Adrenals/Urinary Tract: Normal adrenal glands. Enlarged kidneys with innumerable bilateral cysts compatible with polycystic kidney disease, unchanged. No hydronephrosis. Urinary bladder unremarkable. Stomach/Bowel: Stomach, large and small bowel grossly unremarkable. No contrast extravasation to localize GI bleed. Lymphatic: No adenopathy Reproductive: Prior hysterectomy.  No adnexal masses. Other: No free fluid or free air. Musculoskeletal: No acute bony abnormality. IMPRESSION: VASCULAR Aortoiliac atherosclerosis.  No aneurysm or dissection. No visible contrast extravasation to localize GI bleed. NON-VASCULAR Changes of polycystic kidney disease with enlarged kidneys bilaterally and innumerable hepatic and renal cysts, unchanged. No acute findings. Electronically Signed   By: Janeece Mechanic M.D.   On: 05/19/2023 19:16     Labs:   Basic Metabolic Panel: Recent Labs  Lab 05/19/23 1540 05/20/23 0453 05/21/23 0448 05/22/23 0523  NA 134* 133* 135 135  K 3.7 4.2 4.0 4.2  CL 93* 92* 94* 97*  CO2 29 28 28 28   GLUCOSE 85 82 90 103*  BUN 16 22 11 19   CREATININE 5.67* 6.68*  4.42* 6.93*  CALCIUM  9.4 8.9 8.7* 8.7*  MG  --   --  1.7  --    GFR Estimated Creatinine Clearance: 6.9 mL/min (A) (by C-G formula based on SCr of 6.93 mg/dL (H)). Liver Function Tests: Recent Labs  Lab 05/19/23 1540  AST 13*  ALT 7  ALKPHOS 112  BILITOT 1.4*  PROT 7.2  ALBUMIN 2.6*   No results for input(s): "LIPASE", "AMYLASE" in the last 168 hours. No results for input(s): "AMMONIA" in the last 168 hours. Coagulation profile No results for input(s): "INR", "PROTIME" in the last 168 hours.  CBC: Recent Labs  Lab 05/19/23 1540 05/20/23 0453 05/20/23 1158 05/21/23 0448 05/22/23 0523 05/23/23 0534  WBC 4.2 3.0* 4.0 3.8* 4.1 4.1  NEUTROABS 2.0  --   --   --   --   --   HGB 9.4* 9.0* 8.8* 9.1* 8.7* 9.1*  HCT 30.1* 28.5* 27.2* 28.5* 27.5* 28.4*  MCV 100.0 97.9 98.6 97.6 99.6 100.0  PLT 71* 61* 57* 59* 59* 59*   Cardiac Enzymes: No results for input(s): "CKTOTAL", "CKMB", "CKMBINDEX", "TROPONINI" in the last 168 hours. BNP: Invalid input(s): "POCBNP" CBG: No results for input(s): "GLUCAP" in the last 168 hours. D-Dimer No results for input(s): "DDIMER" in  the last 72 hours. Hgb A1c No results for input(s): "HGBA1C" in the last 72 hours. Lipid Profile No results for input(s): "CHOL", "HDL", "LDLCALC", "TRIG", "CHOLHDL", "LDLDIRECT" in the last 72 hours. Thyroid function studies No results for input(s): "TSH", "T4TOTAL", "T3FREE", "THYROIDAB" in the last 72 hours.  Invalid input(s): "FREET3" Anemia work up Recent Labs    05/21/23 0448  VITAMINB12 207  FOLATE 4.7*  FERRITIN 790*  TIBC 148*  IRON  86   Microbiology Recent Results (from the past 240 hours)  Urine Culture     Status: Abnormal   Collection Time: 05/19/23  7:53 PM   Specimen: Urine, Clean Catch  Result Value Ref Range Status   Specimen Description URINE, CLEAN CATCH  Final   Special Requests   Final    NONE Performed at [facility], [address], [place], [place] [address]     Culture 20,000 COLONIES/mL ESCHERICHIA COLI (A)  Final   Report Status 05/21/2023 FINAL  Final   Organism ID, Bacteria ESCHERICHIA COLI (A)  Final      Susceptibility   Escherichia coli - MIC*    AMPICILLIN 4 SENSITIVE Sensitive     CEFAZOLIN  <=4 SENSITIVE Sensitive     CEFEPIME <=0.12 SENSITIVE Sensitive     CEFTRIAXONE  <=0.25 SENSITIVE Sensitive     CIPROFLOXACIN <=0.25 SENSITIVE Sensitive     GENTAMICIN <=1 SENSITIVE Sensitive     IMIPENEM <=0.25 SENSITIVE Sensitive     NITROFURANTOIN <=16 SENSITIVE Sensitive     TRIMETH/SULFA <=20 SENSITIVE Sensitive     AMPICILLIN/SULBACTAM <=2 SENSITIVE Sensitive     PIP/TAZO <=4 SENSITIVE Sensitive ug/mL    * 20,000 COLONIES/mL ESCHERICHIA COLI  MRSA Next Gen by PCR, Nasal     Status: None   Collection Time: 05/20/23  3:40 AM   Specimen: Nasal Mucosa; Nasal Swab  Result Value Ref Range Status   MRSA by PCR Next Gen NOT DETECTED NOT DETECTED Final    Comment: (NOTE) The GeneXpert MRSA Assay (FDA approved for NASAL specimens only), is one component of a comprehensive MRSA colonization surveillance program. It is not intended to diagnose MRSA infection nor to guide or monitor treatment for MRSA infections. Test performance is not FDA approved in patients less than 21 years old. Performed at [facility], [address], [place], [place] [address]      Discharge Instructions:   Discharge Instructions     Diet - low sodium heart healthy   Complete by: As directed    Discharge instructions   Complete by: As directed    Follow-up with your primary care provider in 1 week.  Follow-up with urology as outpatient if you continue to experience bleeding.  Check blood work in the next visit.  Take  aspirin  after few days.  Seek medical attention for worsening symptoms.   Increase activity slowly   Complete by: As directed       Allergies as of 05/23/2023       Reactions   E-mycin [erythromycin] Hives   Lisinopril  Cough   Sulfa  Antibiotics Rash   Skin Rash        Medication List     PAUSE taking these medications    aspirin  EC 81 MG tablet Wait to take this until: May 26, 2023 Morning Take 81 mg by mouth at bedtime. Swallow whole.       TAKE these medications    acetaminophen  500 MG tablet Commonly known as: TYLENOL  Take 500 mg by  mouth every 8 (eight) hours as needed for moderate pain. No more than 3 a day   atorvastatin  40 MG tablet Commonly known as: LIPITOR Take 40 mg by mouth at bedtime.   cyanocobalamin  1000 MCG tablet Take 1 tablet (1,000 mcg total) by mouth daily.   Darbepoetin Alfa  150 MCG/0.3ML Sosy injection Commonly known as: ARANESP  Inject 0.3 mLs (150 mcg total) into the vein every Wednesday with hemodialysis.   doxercalciferol  4 MCG/2ML injection Commonly known as: HECTOROL  Inject 4 mLs (8 mcg total) into the vein every Monday, Wednesday, and Friday with hemodialysis.   ferric citrate  1 GM 210 MG(Fe) tablet Commonly known as: AURYXIA  Take 210 mg by mouth 3 (three) times daily with meals.   fluticasone  50 MCG/ACT nasal spray Commonly known as: FLONASE  Place 1 spray into both nostrils daily as needed for allergies or rhinitis.   folic acid  1 MG tablet Commonly known as: FOLVITE  Take 1 tablet (1 mg total) by mouth daily.   isosorbide  mononitrate 30 MG 24 hr tablet Commonly known as: IMDUR  Take 0.5 tablets (15 mg total) by mouth daily. What changed: how much to take   lidocaine  5 % Commonly known as: LIDODERM  Place 1 patch onto the skin daily. Remove & Discard patch within 12 hours or as directed by MD   loperamide 2 MG tablet Commonly known as: IMODIUM A-D Take 2 mg by mouth daily as needed for diarrhea or loose stools.   loratadine  10 MG tablet Commonly known as: CLARITIN  Take 10 mg by mouth daily as needed for allergies.   losartan  25 MG tablet Commonly known as: COZAAR  Take 0.5 tablets (12.5 mg total) by mouth daily. What changed: how much to take    metoprolol  succinate 25 MG 24 hr tablet Commonly known as: TOPROL -XL Take 25 mg by mouth daily.   nitroGLYCERIN  0.4 MG SL tablet Commonly known as: NITROSTAT  Place 0.4 mg under the tongue every 5 (five) minutes as needed for chest pain.   pantoprazole  40 MG tablet Commonly known as: PROTONIX  Take 40 mg by mouth 2 (two) times daily.   senna-docusate 8.6-50 MG tablet Commonly known as: Senokot-S Take 1 tablet by mouth at bedtime as needed for mild constipation.   SENSIPAR  PO Take 1 tablet by mouth every Monday, Wednesday, and Friday with hemodialysis.        Follow-up Information     Ziefel, Kimberly A, PA-C Follow up.   Specialty: Physician Assistant Why: TIME:  9:00 AM DATE : APRIL 81,1914  PLEASE ALL MEDICATION, ID and INS CARD Contact information: [address] [place] [address] [phone]         Wally Gunnels, MD. Call.   Specialty: Urology Contact information: [address] [place] [address] [phone]         [facility], [facility] Follow up.   Specialty: Home Health Services Why: Physical therapy.  Office will call to arrange follow up after hospital discharge. Contact information: [address] [place] [address] [phone]                  Time coordinating discharge: 39 minutes  Signed:  Lacoya Wilbanks  [facility] 05/23/2023, 1:46 PM

## 2023-05-23 NOTE — Discharge Planning (Signed)
 [facility] Patient Discharge Orders - [facility] CLINIC: [facility]  Patient's name: Kendra Clay Admit/DC Dates: 05/19/2023 - 05/23/2023  DISCHARGE DIAGNOSES: Acute GI bleeding  Hematuria ESRD on HD  HD ORDER CHANGES: Heparin  change: no heparin  due to GIB and hematuria EDW Change: yes New EDW: 51.7 [place] Change: no  ANEMIA MANAGEMENT: Aranesp : Given: no   Amount/Date of last dose: n/a ESA dose for discharge: mircera 120 mcg mcg IV q 2 weeks, to start on 05/25/23 IV Iron  dose at discharge: n/a Transfusion: Given: n/a  BONE/MINERAL MEDICATIONS: Hectorol /Calcitriol change: Hectorol  5 mcg - per protocol Sensipar /Parsabiv change: Sensipar  30 mg MWF  ACCESS INTERVENTION/CHANGE: n/a Details:   RECENT LABS: Recent Labs  Lab 05/19/23 1540 05/20/23 0453 05/22/23 0523 05/23/23 0534  HGB 9.4*   < > 8.7* 9.1*  NA 134*   < > 135  --   K 3.7   < > 4.2  --   CALCIUM  9.4   < > 8.7*  --   ALBUMIN 2.6*  --   --   --    < > = values in this interval not displayed.    IV ANTIBIOTICS: n/a Details:  OTHER ANTICOAGULATION: On Coumadin?: n/a Last INR: Managed By:  OTHER/APPTS/LAB ORDERS:   D/C Meds to be reconciled by nurse after every discharge.  Completed By: Hersey Lorenzo, NP-C   Reviewed by: MD:______ RN_______

## 2023-05-25 ENCOUNTER — Encounter (HOSPITAL_COMMUNITY): Payer: Self-pay | Admitting: Internal Medicine

## 2023-05-25 ENCOUNTER — Telehealth: Payer: Self-pay | Admitting: Physician Assistant

## 2023-05-25 NOTE — Telephone Encounter (Signed)
 Transition of Care - Initial Contact after Hospitalization  Date of discharge:  05/23/23 Date of contact: 05/25/23  Method: Phone Spoke to: Patient  Patient contacted to discuss transition of care from recent inpatient hospitalization. Patient was admitted to [facility] from 05/19/23 to 05/23/23 with discharge diagnosis of: hematuira, possible GI bleed. Pt reports she is still having some blood in her urine, urology notes reviewed and seems this is not unexpected. Advised her to call for urology follow up and we will continue to monitor her hemoglobin and at dialysis. Heparin  is on hold with HD.   The discharge medication list was reviewed. Patient understands the changes and has no concerns.   Patient will return to his/her outpatient HD unit on: Today, reports BP kept dropping. Thinks new EDW is too low, raise to 52kg.   She has not heard from home health PT yet, will let us  know if no call in the next 1-2 days. No other concerns at this time.  Ramona Burner, PA-C 05/25/2023, 1:46 PM  [facility] Pager: [phone]

## 2023-05-25 NOTE — Progress Notes (Signed)
 Late Note Entry- May 25, 2023  Pt was d/c on Saturday. Contacted [facility]  this morning to be advised of pt's d/c date and that pt should have resumed care this morning.   Lauraine Polite Renal Navigator [phone]

## 2023-08-31 ENCOUNTER — Ambulatory Visit (HOSPITAL_BASED_OUTPATIENT_CLINIC_OR_DEPARTMENT_OTHER)
Admission: RE | Admit: 2023-08-31 | Discharge: 2023-08-31 | Disposition: A | Discharge status: Home / Self Care | Source: Ambulatory Visit | Attending: Acute Care | Admitting: Acute Care

## 2023-08-31 DIAGNOSIS — Z87891 Personal history of nicotine dependence: Secondary | ICD-10-CM

## 2023-08-31 DIAGNOSIS — Z122 Encounter for screening for malignant neoplasm of respiratory organs: Secondary | ICD-10-CM | POA: Diagnosis not present

## 2023-09-07 ENCOUNTER — Other Ambulatory Visit: Payer: Self-pay | Admitting: Acute Care

## 2023-09-07 DIAGNOSIS — Z122 Encounter for screening for malignant neoplasm of respiratory organs: Secondary | ICD-10-CM

## 2023-09-07 DIAGNOSIS — Z87891 Personal history of nicotine dependence: Secondary | ICD-10-CM

## 2023-09-17 ENCOUNTER — Other Ambulatory Visit: Payer: Self-pay | Admitting: Acute Care

## 2023-09-17 DIAGNOSIS — Z87891 Personal history of nicotine dependence: Secondary | ICD-10-CM

## 2023-09-17 DIAGNOSIS — Z122 Encounter for screening for malignant neoplasm of respiratory organs: Secondary | ICD-10-CM

## 2024-01-29 ENCOUNTER — Encounter (HOSPITAL_COMMUNITY): Payer: Self-pay

## 2024-02-01 ENCOUNTER — Encounter (HOSPITAL_COMMUNITY): Payer: Self-pay

## 2024-02-03 ENCOUNTER — Encounter (HOSPITAL_COMMUNITY): Admission: RE | Payer: Self-pay | Source: Home / Self Care

## 2024-02-03 ENCOUNTER — Ambulatory Visit (HOSPITAL_COMMUNITY): Admission: RE | Admit: 2024-02-03 | Source: Home / Self Care | Admitting: Vascular Surgery

## 2024-02-03 SURGERY — A/V FISTULAGRAM
Anesthesia: LOCAL | Site: Arm Lower | Laterality: Right

## 2024-02-26 ENCOUNTER — Encounter (HOSPITAL_BASED_OUTPATIENT_CLINIC_OR_DEPARTMENT_OTHER): Payer: Self-pay

## 2024-02-26 ENCOUNTER — Ambulatory Visit (HOSPITAL_BASED_OUTPATIENT_CLINIC_OR_DEPARTMENT_OTHER)
Admission: EM | Admit: 2024-02-26 | Discharge: 2024-02-26 | Disposition: A | Discharge status: Home / Self Care | Attending: Family Medicine | Admitting: Family Medicine

## 2024-02-26 DIAGNOSIS — H6123 Impacted cerumen, bilateral: Secondary | ICD-10-CM

## 2024-02-26 DIAGNOSIS — H9203 Otalgia, bilateral: Secondary | ICD-10-CM

## 2024-02-26 DIAGNOSIS — H9313 Tinnitus, bilateral: Secondary | ICD-10-CM | POA: Diagnosis not present

## 2024-02-26 DIAGNOSIS — H9190 Unspecified hearing loss, unspecified ear: Secondary | ICD-10-CM

## 2024-02-26 NOTE — Discharge Instructions (Addendum)
 Bilateral ear pain with tinnitus and hearing loss: Exam was normal except for some wax in the ear canal.  I believe the ear pain and tinnitus may be secondary to hearing loss and nerve pain.  Encouraged to see ENT for further follow-up.  Bilateral cerumen impaction: Ear canals were not blocked by wax but there was small to moderate amount of wax in each canal.  Ear lavage was done and although the wax did not really come out it moved around and I could see the whole eardrum in both ears without problem.  See below for ways to soften earwax.  Make an appointment with the ENT for ear cleaning if needed.  Follow-up here if needed  There is hard wax in your ear(s).  It should be softened before someone tries to rinse out the ears.  Use Colace gelcaps (an over-the-counter stool softener).  Use a large needle or a safety pin to poke a hole in 1 part of the gelcap.  Squeeze 1 or 2 gelcaps into an ear canal and then put a cottonball in the ear.  Do this to both ears if both ears have wax.  Do this at night prior to sleep (for 2-4 nights) and just prior to a planned visit for ear cleaning.  This will soften the wax so that it is easy to clean your ears out.

## 2024-02-26 NOTE — ED Provider Notes (Addendum)
 " [facility]    CSN: [id number] Arrival date & time: 02/26/24  1357      History   Chief Complaint Chief Complaint  Patient presents with   Otalgia    HPI Kendra Clay is a 64 y.o. female.   64 year old female with complaint of intermittent bilateral ear pain and ear popping since approximately 02/12/2024 or earlier.  She has a history of tinnitus.  She is concerned she might have an ear infection.   Otalgia Associated symptoms: no abdominal pain, no cough, no diarrhea, no fever, no rash, no sore throat and no vomiting     Past Medical History:  Diagnosis Date   Complication of anesthesia    Enlarged liver    secondary to PKD   ESRD on hemodialysis (HCC)    MWF [facility]   Heart murmur    slight per ? Dr Debrah 30 years ago. 2D ECHO  30 yearsa go.   History of blood transfusion    C- Section   History of bronchitis    numerous, last time> 1 year   Hypertension    Night muscle spasms    legs   Polycystic kidney disease    Genetic dx 23 years ago   PONV (postoperative nausea and vomiting)    patch helped   Scoliosis    Strabismus     Patient Active Problem List   Diagnosis Date Noted   GI bleed 05/20/2023   Acute GI bleeding 05/19/2023   Hematuria 05/19/2023   Hypokalemia 01/30/2023   Other fatigue 01/30/2023   Generalized weakness 01/29/2023   Anemia of chronic disease 10/15/2021   Thrombocytopenia 10/15/2021   Hyperbilirubinemia 10/15/2021   Hyponatremia 10/15/2021   Hypomagnesemia 10/15/2021   Leg wound, right 10/15/2021   Pressure ulcer 10/15/2021   Pressure injury of skin 10/14/2021   Acute on chronic combined systolic and diastolic CHF (congestive heart failure) (HCC) 10/13/2021   GERD without esophagitis 10/13/2021   Coronary artery disease involving native coronary artery of native heart without angina pectoris 10/13/2021   Multiple fractures of both lower extremities and ribs 09/09/2021   Pancytopenia (HCC) 08/30/2021    Essential hypertension 08/30/2021   Closed fracture of left distal femur (HCC) 08/29/2021   Fracture of shaft of right tibia and fibula, open type I or II, initial encounter 08/29/2021   Closed fracture of left distal radius 08/29/2021   Ground-level fall 08/29/2021   COVID-19 04/25/2020   Hypertensive urgency 04/25/2020   COPD with acute exacerbation (HCC) 04/25/2020   Hyperlipidemia 04/25/2020   Pelvic fracture (HCC) 04/25/2020   Pubic ramus fracture (HCC) 04/24/2020   History of anemia due to CKD 05/19/2018   Chronic heart failure with mildly reduced ejection fraction (HFmrEF, 41-49%) (HCC) 05/19/2018   Tibia/fibula fracture, left, closed, initial encounter 05/18/2018   Acute pulmonary edema (HCC) 08/14/2015   Acute respiratory failure with hypoxia (HCC) 08/14/2015   Normocytic normochromic anemia 08/14/2015   Polycystic kidney disease 07/17/2013   ESRD on dialysis (HCC) 08/26/2011    Past Surgical History:  Procedure Laterality Date   A/V FISTULAGRAM Right 07/10/2022   Procedure: A/V Fistulagram;  Surgeon: Gretta Lonni PARAS, MD;  Location: [facility] INVASIVE CV LAB;  Service: Cardiovascular;  Laterality: Right;   A/V SHUNT INTERVENTION N/A 04/30/2023   Procedure: A/V SHUNT INTERVENTION;  Surgeon: Melia Lynwood ORN, MD;  Location: [facility] INVASIVE CV LAB;  Service: Cardiovascular;  Laterality: N/A;   ABDOMINAL HYSTERECTOMY     AV FISTULA PLACEMENT  09/01/2011  Procedure: ARTERIOVENOUS (AV) FISTULA CREATION;  Surgeon: Krystal JULIANNA Doing, MD;  Location: [facility] OR;  Service: Vascular;  Laterality: Right;   CESAREAN SECTION  1997   COLONOSCOPY N/A 05/21/2023   Procedure: COLONOSCOPY;  Surgeon: Federico Rosario BROCKS, MD;  Location: [facility] ENDOSCOPY;  Service: Gastroenterology;  Laterality: N/A;   EYE SURGERY     for lazy eye   ORIF FEMUR FRACTURE Left 08/29/2021   Procedure: OPEN REDUCTION INTERNAL FIXATION (ORIF) DISTAL FEMUR FRACTURE;  Surgeon: Kendal Franky SQUIBB, MD;  Location: [facility] OR;  Service: Orthopedics;  Laterality:  Left;   ORIF WRIST FRACTURE Left 08/29/2021   Procedure: OPEN REDUCTION INTERNAL FIXATION (ORIF) WRIST FRACTURE;  Surgeon: Kendal Franky SQUIBB, MD;  Location: [facility] OR;  Service: Orthopedics;  Laterality: Left;   PERIPHERAL VASCULAR BALLOON ANGIOPLASTY  07/10/2022   Procedure: PERIPHERAL VASCULAR BALLOON ANGIOPLASTY;  Surgeon: Gretta Lonni PARAS, MD;  Location: [facility] INVASIVE CV LAB;  Service: Cardiovascular;;   TIBIA IM NAIL INSERTION Left 05/19/2018   Procedure: INTRAMEDULLARY (IM) NAIL TIBIAL;  Surgeon: Cristy Bonner DASEN, MD;  Location: [facility] OR;  Service: Orthopedics;  Laterality: Left;   TIBIA IM NAIL INSERTION Right 08/29/2021   Procedure: INTRAMEDULLARY (IM) NAIL TIBIAL WITH IRRIGATION AND DEBRIDMENT;  Surgeon: Kendal Franky SQUIBB, MD;  Location: [facility] OR;  Service: Orthopedics;  Laterality: Right;   VENOUS ANGIOPLASTY Right 04/30/2023   Procedure: VENOUS ANGIOPLASTY;  Surgeon: Melia Lynwood ORN, MD;  Location: [facility] INVASIVE CV LAB;  Service: Cardiovascular;  Laterality: Right;  cephalic arch in stent    OB History   No obstetric history on file.      Home Medications    Prior to Admission medications  Medication Sig Start Date End Date Taking? Authorizing Provider  acetaminophen  (TYLENOL ) 500 MG tablet Take 500 mg by mouth every 8 (eight) hours as needed for moderate pain. No more than 3 a day    [provider]  aspirin  EC 81 MG tablet Take 81 mg by mouth at bedtime. Swallow whole.    [provider]  atorvastatin  (LIPITOR) 40 MG tablet Take 40 mg by mouth at bedtime.    [provider]  Cinacalcet  HCl (SENSIPAR  PO) Take 1 tablet by mouth every Monday, Wednesday, and Friday with hemodialysis.    [provider]  Darbepoetin Alfa  (ARANESP ) 150 MCG/0.3ML SOSY injection Inject 0.3 mLs (150 mcg total) into the vein every Wednesday with hemodialysis. 09/25/21   Setzer, Nena PARAS, PA-C  doxercalciferol  (HECTOROL ) 4 MCG/2ML injection Inject 4 mLs (8 mcg total) into the vein every Monday,  Wednesday, and Friday with hemodialysis. 09/25/21   Setzer, Sandra J, PA-C  ferric citrate  (AURYXIA ) 1 GM 210 MG(Fe) tablet Take 210 mg by mouth 3 (three) times daily with meals.    [provider]  fluticasone  (FLONASE ) 50 MCG/ACT nasal spray Place 1 spray into both nostrils daily as needed for allergies or rhinitis.    [provider]  isosorbide  mononitrate (IMDUR ) 30 MG 24 hr tablet Take 0.5 tablets (15 mg total) by mouth daily. Patient taking differently: Take 30 mg by mouth daily. 02/01/23   Jillian Buttery, MD  lidocaine  (LIDODERM ) 5 % Place 1 patch onto the skin daily. Remove & Discard patch within 12 hours or as directed by MD    [provider]  loperamide (IMODIUM A-D) 2 MG tablet Take 2 mg by mouth daily as needed for diarrhea or loose stools.    [provider]  loratadine  (CLARITIN ) 10 MG tablet Take 10 mg  by mouth daily as needed for allergies.    [provider]  losartan  (COZAAR ) 25 MG tablet Take 0.5 tablets (12.5 mg total) by mouth daily. Patient taking differently: Take 50 mg by mouth daily. 02/01/23   Jillian Buttery, MD  metoprolol  succinate (TOPROL -XL) 25 MG 24 hr tablet Take 25 mg by mouth daily. 03/16/23   [provider]  nitroGLYCERIN  (NITROSTAT ) 0.4 MG SL tablet Place 0.4 mg under the tongue every 5 (five) minutes as needed for chest pain.    [provider]  pantoprazole  (PROTONIX ) 40 MG tablet Take 40 mg by mouth 2 (two) times daily. 02/26/20   [provider]    Family History Family History  Problem Relation Age of Onset   Arthritis Mother    Kidney disease Father    Polycystic kidney disease Son    Asthma Son    Hypertension Maternal Grandmother     Social History Social History[1]   Allergies   E-mycin [erythromycin], Lisinopril , and Sulfa antibiotics   Review of Systems Review of Systems  Constitutional:  Negative for chills and fever.  HENT:  Positive for ear pain. Negative for  sore throat.   Eyes:  Negative for pain and visual disturbance.  Respiratory:  Negative for cough and shortness of breath.   Cardiovascular:  Negative for chest pain and palpitations.  Gastrointestinal:  Negative for abdominal pain, constipation, diarrhea, nausea and vomiting.  Genitourinary:  Negative for dysuria and hematuria.  Musculoskeletal:  Negative for arthralgias and back pain.  Skin:  Negative for color change and rash.  Neurological:  Negative for seizures and syncope.  All other systems reviewed and are negative.    Physical Exam Triage Vital Signs ED Triage Vitals  Encounter Vitals Group     BP 02/26/24 1557 (!) 149/81     Girls Systolic BP Percentile --      Girls Diastolic BP Percentile --      Boys Systolic BP Percentile --      Boys Diastolic BP Percentile --      Pulse Rate 02/26/24 1557 73     Resp 02/26/24 1557 20     Temp 02/26/24 1557 98.1 F (36.7 C)     Temp Source 02/26/24 1557 Oral     SpO2 02/26/24 1557 98 %     Weight --      Height --      Head Circumference --      Peak Flow --      Pain Score 02/26/24 1555 0     Pain Loc --      Pain Education --      Exclude from Growth Chart --    No data found.  Updated Vital Signs BP (!) 149/81 (BP Location: Right Arm)   Pulse 73   Temp 98.1 F (36.7 C) (Oral)   Resp 20   SpO2 98%   Visual Acuity Right Eye Distance:   Left Eye Distance:   Bilateral Distance:    Right Eye Near:   Left Eye Near:    Bilateral Near:     Physical Exam Vitals and nursing note reviewed.  Constitutional:      General: She is not in acute distress.    Appearance: She is well-developed. She is not ill-appearing, toxic-appearing or diaphoretic.  HENT:     Head: Normocephalic and atraumatic.     Right Ear: Tympanic membrane and external ear normal. Decreased hearing (Patient is very hard of hearing.) noted. There is  impacted cerumen (Small amount of wax in the canal but the canal is not obstructed by wax.  The TM  is partially visible and normal.).     Left Ear: Tympanic membrane and external ear normal. Decreased hearing (Patient is very hard of hearing.) noted. There is impacted cerumen (Small amount of wax in the canal but the canal is not obstructed by wax. The TM is partially visible and normal.).     Ears:     Comments: Reassessment after ear lavage:    Nose: No congestion or rhinorrhea.     Right Sinus: No maxillary sinus tenderness or frontal sinus tenderness.     Left Sinus: No maxillary sinus tenderness or frontal sinus tenderness.     Mouth/Throat:     Lips: Pink.     Mouth: Mucous membranes are moist.     Pharynx: Uvula midline. No oropharyngeal exudate or posterior oropharyngeal erythema.     Tonsils: No tonsillar exudate.  Eyes:     Conjunctiva/sclera: Conjunctivae normal.     Pupils: Pupils are equal, round, and reactive to light.  Cardiovascular:     Rate and Rhythm: Normal rate and regular rhythm.     Heart sounds: S1 normal and S2 normal. No murmur heard. Pulmonary:     Effort: Pulmonary effort is normal. No respiratory distress.     Breath sounds: Normal breath sounds. No decreased breath sounds, wheezing, rhonchi or rales.  Abdominal:     General: Bowel sounds are normal.     Palpations: Abdomen is soft.     Tenderness: There is no abdominal tenderness.  Musculoskeletal:        General: No swelling.     Cervical back: Neck supple.  Lymphadenopathy:     Head:     Right side of head: No submental, submandibular, tonsillar, preauricular or posterior auricular adenopathy.     Left side of head: No submental, submandibular, tonsillar, preauricular or posterior auricular adenopathy.     Cervical: No cervical adenopathy.     Right cervical: No superficial cervical adenopathy.    Left cervical: No superficial cervical adenopathy.  Skin:    General: Skin is warm and dry.     Capillary Refill: Capillary refill takes less than 2 seconds.     Findings: No rash.  Neurological:      Mental Status: She is alert and oriented to person, place, and time.  Psychiatric:        Mood and Affect: Mood normal.      UC Treatments / Results  Labs (all labs ordered are listed, but only abnormal results are displayed) Labs Reviewed - No data to display  EKG   Radiology No results found.  Procedures Procedures (including critical care time)  Medications Ordered in UC Medications - No data to display  Initial Impression / Assessment and Plan / UC Course  I have reviewed the triage vital signs and the nursing notes.  Pertinent labs & imaging results that were available during my care of the patient were reviewed by me and considered in my medical decision making (see chart for details).  Plan of Care (see discharge instructions for additional patient precautions and education): Bilateral ear pain with tinnitus and hearing loss: Exam was normal except for some wax in the ear canal.  I believe the ear pain and tinnitus may be secondary to hearing loss and nerve pain.  Encouraged to see ENT for further follow-up.  Bilateral cerumen impaction: Ear canals were not blocked by  wax but there was small to moderate amount of wax in each canal.  Ear lavage was done and although the wax did not really come out it moved around and I could see the whole eardrum in both ears without problem.  See discharge instruction for ways to soften earwax.  Make an appointment with the ENT for ear cleaning if needed.  Follow-up here if needed.  I reviewed the plan of care with the patient and/or the patient's guardian.  The patient and/or guardian had time to ask questions and acknowledged that the questions were answered.  Final Clinical Impressions(s) / UC Diagnoses   Final diagnoses:  Acute ear pain, bilateral  Tinnitus of both ears  Hard of hearing  Bilateral impacted cerumen     Discharge Instructions      Bilateral ear pain with tinnitus and hearing loss: Exam was normal except for  some wax in the ear canal.  I believe the ear pain and tinnitus may be secondary to hearing loss and nerve pain.  Encouraged to see ENT for further follow-up.  Bilateral cerumen impaction: Ear canals were not blocked by wax but there was small to moderate amount of wax in each canal.  Ear lavage was done and although the wax did not really come out it moved around and I could see the whole eardrum in both ears without problem.  See below for ways to soften earwax.  Make an appointment with the ENT for ear cleaning if needed.  Follow-up here if needed  There is hard wax in your ear(s).  It should be softened before someone tries to rinse out the ears.  Use Colace gelcaps (an over-the-counter stool softener).  Use a large needle or a safety pin to poke a hole in 1 part of the gelcap.  Squeeze 1 or 2 gelcaps into an ear canal and then put a cottonball in the ear.  Do this to both ears if both ears have wax.  Do this at night prior to sleep (for 2-4 nights) and just prior to a planned visit for ear cleaning.  This will soften the wax so that it is easy to clean your ears out.      ED Prescriptions   None    PDMP not reviewed this encounter.    Ival Domino, FNP 02/26/24 1651     [1]  Social History Tobacco Use   Smoking status: Former    Current packs/day: 0.00    Average packs/day: 1.5 packs/day for 34.9 years (52.4 ttl pk-yrs)    Types: Cigarettes    Start date: 80    Quit date: 01/10/2015    Years since quitting: 9.1   Smokeless tobacco: Former    Quit date: 01/09/2014  Vaping Use   Vaping status: Never Used  Substance Use Topics   Alcohol  use: No    Alcohol /week: 0.0 standard drinks of alcohol    Drug use: No     Ival Domino, FNP 02/26/24 1701  "

## 2024-02-26 NOTE — ED Triage Notes (Signed)
 Pt c/o occ ear pain and ear popping for over 2 weeks. Reports hx of tinnitus.

## 2024-03-02 ENCOUNTER — Other Ambulatory Visit: Payer: Self-pay

## 2024-03-02 ENCOUNTER — Emergency Department (HOSPITAL_COMMUNITY)

## 2024-03-02 ENCOUNTER — Emergency Department (HOSPITAL_COMMUNITY)
Admission: EM | Admit: 2024-03-02 | Discharge: 2024-03-04 | Disposition: A | Discharge status: Home / Self Care | Attending: Emergency Medicine | Admitting: Emergency Medicine

## 2024-03-02 ENCOUNTER — Encounter (HOSPITAL_COMMUNITY): Payer: Self-pay | Admitting: Emergency Medicine

## 2024-03-02 DIAGNOSIS — Z79899 Other long term (current) drug therapy: Secondary | ICD-10-CM | POA: Insufficient documentation

## 2024-03-02 DIAGNOSIS — Z992 Dependence on renal dialysis: Secondary | ICD-10-CM | POA: Diagnosis not present

## 2024-03-02 DIAGNOSIS — Z7982 Long term (current) use of aspirin: Secondary | ICD-10-CM | POA: Diagnosis not present

## 2024-03-02 DIAGNOSIS — R0602 Shortness of breath: Secondary | ICD-10-CM | POA: Diagnosis present

## 2024-03-02 DIAGNOSIS — N186 End stage renal disease: Secondary | ICD-10-CM | POA: Diagnosis not present

## 2024-03-02 LAB — COMPREHENSIVE METABOLIC PANEL WITH GFR
ALT: 5 U/L (ref 0–44)
AST: 13 U/L — ABNORMAL LOW (ref 15–41)
Albumin: 3.9 g/dL (ref 3.5–5.0)
Alkaline Phosphatase: 168 U/L — ABNORMAL HIGH (ref 38–126)
Anion gap: 17 — ABNORMAL HIGH (ref 5–15)
BUN: 57 mg/dL — ABNORMAL HIGH (ref 8–23)
CO2: 27 mmol/L (ref 22–32)
Calcium: 10.7 mg/dL — ABNORMAL HIGH (ref 8.9–10.3)
Chloride: 97 mmol/L — ABNORMAL LOW (ref 98–111)
Creatinine, Ser: 10.2 mg/dL — ABNORMAL HIGH (ref 0.44–1.00)
GFR, Estimated: 4 mL/min — ABNORMAL LOW
Glucose, Bld: 102 mg/dL — ABNORMAL HIGH (ref 70–99)
Potassium: 5 mmol/L (ref 3.5–5.1)
Sodium: 141 mmol/L (ref 135–145)
Total Bilirubin: 0.9 mg/dL (ref 0.0–1.2)
Total Protein: 7.3 g/dL (ref 6.5–8.1)

## 2024-03-02 LAB — HEPATITIS B SURFACE ANTIGEN: Hepatitis B Surface Ag: NONREACTIVE

## 2024-03-02 LAB — I-STAT CHEM 8, ED
BUN: 58 mg/dL — ABNORMAL HIGH (ref 8–23)
Calcium, Ion: 1.16 mmol/L (ref 1.15–1.40)
Chloride: 101 mmol/L (ref 98–111)
Creatinine, Ser: 10.9 mg/dL — ABNORMAL HIGH (ref 0.44–1.00)
Glucose, Bld: 101 mg/dL — ABNORMAL HIGH (ref 70–99)
HCT: 33 % — ABNORMAL LOW (ref 36.0–46.0)
Hemoglobin: 11.2 g/dL — ABNORMAL LOW (ref 12.0–15.0)
Potassium: 4.8 mmol/L (ref 3.5–5.1)
Sodium: 139 mmol/L (ref 135–145)
TCO2: 26 mmol/L (ref 22–32)

## 2024-03-02 LAB — CBC WITH DIFFERENTIAL/PLATELET
Abs Immature Granulocytes: 0.02 10*3/uL (ref 0.00–0.07)
Basophils Absolute: 0 10*3/uL (ref 0.0–0.1)
Basophils Relative: 1 %
Eosinophils Absolute: 0.1 10*3/uL (ref 0.0–0.5)
Eosinophils Relative: 1 %
HCT: 33.9 % — ABNORMAL LOW (ref 36.0–46.0)
Hemoglobin: 10.5 g/dL — ABNORMAL LOW (ref 12.0–15.0)
Immature Granulocytes: 1 %
Lymphocytes Relative: 28 %
Lymphs Abs: 1.2 10*3/uL (ref 0.7–4.0)
MCH: 28.8 pg (ref 26.0–34.0)
MCHC: 31 g/dL (ref 30.0–36.0)
MCV: 93.1 fL (ref 80.0–100.0)
Monocytes Absolute: 0.2 10*3/uL (ref 0.1–1.0)
Monocytes Relative: 5 %
Neutro Abs: 2.8 10*3/uL (ref 1.7–7.7)
Neutrophils Relative %: 64 %
Platelets: 70 10*3/uL — ABNORMAL LOW (ref 150–400)
RBC: 3.64 MIL/uL — ABNORMAL LOW (ref 3.87–5.11)
RDW: 15.8 % — ABNORMAL HIGH (ref 11.5–15.5)
WBC: 4.4 10*3/uL (ref 4.0–10.5)
nRBC: 0 % (ref 0.0–0.2)

## 2024-03-02 MED ORDER — PENTAFLUOROPROP-TETRAFLUOROETH EX AERO: 1.0000 | INHALATION_SPRAY | CUTANEOUS | Status: DC | PRN

## 2024-03-02 MED ORDER — CHLORHEXIDINE GLUCONATE CLOTH 2 % EX PADS: 6.0000 | MEDICATED_PAD | Freq: Every day | CUTANEOUS | Status: DC

## 2024-03-02 MED ORDER — ALTEPLASE 2 MG IJ SOLR: 2.0000 mg | Freq: Once | INTRAMUSCULAR | Status: DC | PRN

## 2024-03-02 MED ORDER — ANTICOAGULANT SODIUM CITRATE 4% (200MG/5ML) IV SOLN
5.0000 mL | Status: DC | PRN
  Filled 2024-03-02: qty 5

## 2024-03-02 MED ORDER — LIDOCAINE-PRILOCAINE 2.5-2.5 % EX CREA: 1.0000 | TOPICAL_CREAM | CUTANEOUS | Status: DC | PRN

## 2024-03-02 MED ORDER — HEPARIN SODIUM (PORCINE) 1000 UNIT/ML DIALYSIS: 1000.0000 [IU] | INTRAMUSCULAR | Status: DC | PRN

## 2024-03-02 MED ORDER — LIDOCAINE HCL (PF) 1 % IJ SOLN: 5.0000 mL | INTRAMUSCULAR | Status: DC | PRN

## 2024-03-02 NOTE — ED Notes (Signed)
 Pt returned from dialysis, NAD noted

## 2024-03-02 NOTE — Progress Notes (Signed)
 Pt receives out-pt HD at [facility] on MWF 5:50 am chair time. Will assist as needed.   Randine Mungo Dialysis Navigator [phone]

## 2024-03-02 NOTE — Discharge Instructions (Signed)
 Please follow-up with your primary care physician and continue outpatient dialysis regimen.
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
## 2024-03-02 NOTE — ED Provider Notes (Signed)
 Patient returned from dialysis earlier and reports she is feeling much better with her breathing.  I reviewed her note and evaluation and it was documented that if she was feeling better she would be safe for discharge home.  Given her improvement in symptoms and well appearance and back to her home oxygen requirement now, I feel she is safe for discharge home.  Patient understands return precaution and follow-up instructions and will be discharged for outpatient follow-up.   Clinical Impression: 1. End-stage renal disease needing dialysis [facility])     Disposition: Discharge  Condition: Good  I have discussed the results, Dx and Tx plan with the pt(& family if present). He/she/they expressed understanding and agree(s) with the plan. Discharge instructions discussed at great length. Strict return precautions discussed and pt &/or family have verbalized understanding of the instructions. No further questions at time of discharge.    New Prescriptions   No medications on file    Follow Up: No follow-up provider specified.     Hillery Bhalla, Lonni PARAS, MD 03/02/24 [phone]
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
## 2024-03-03 LAB — HEPATITIS B SURFACE ANTIBODY, QUANTITATIVE: Hep B S AB Quant (Post): 30.6 m[IU]/mL

## 2024-03-03 NOTE — Progress Notes (Signed)
 Late Note Entry- Mar 03, 2024  Pt was d/c yesterday per notes. Contacted [facility] to be advised of pt's d/c from ED yesterday and pt should resume care tomorrow.   Randine Mungo Dialysis Navigator [phone]

## 2024-03-11 ENCOUNTER — Encounter (HOSPITAL_COMMUNITY): Payer: Self-pay | Admitting: Vascular Surgery
